# Patient Record
Sex: Female | Born: 1970 | Race: White | Hispanic: No | Smoking: Current every day smoker
Health system: Southern US, Community
[De-identification: ages and names within clinical notes are randomized; demographics above are authoritative.]

## PROBLEM LIST (undated history)

## (undated) ENCOUNTER — Emergency Department (HOSPITAL_COMMUNITY): Admission: EM | Payer: Medicaid Other | Source: Home / Self Care

## (undated) DIAGNOSIS — IMO0002 Reserved for concepts with insufficient information to code with codable children: Secondary | ICD-10-CM

## (undated) DIAGNOSIS — I469 Cardiac arrest, cause unspecified: Secondary | ICD-10-CM

## (undated) HISTORY — PX: NECK SURGERY: SHX720

---

## 2003-11-07 ENCOUNTER — Encounter: Admission: RE | Admit: 2003-11-07 | Discharge: 2003-11-07 | Payer: Self-pay | Admitting: Psychiatry

## 2004-06-02 ENCOUNTER — Ambulatory Visit (HOSPITAL_COMMUNITY): Payer: Self-pay | Admitting: Psychiatry

## 2004-07-14 ENCOUNTER — Ambulatory Visit (HOSPITAL_COMMUNITY): Payer: Self-pay | Admitting: Psychiatry

## 2004-08-06 ENCOUNTER — Ambulatory Visit (HOSPITAL_COMMUNITY): Payer: Self-pay | Admitting: Psychiatry

## 2004-09-12 ENCOUNTER — Ambulatory Visit (HOSPITAL_COMMUNITY): Payer: Self-pay | Admitting: Psychiatry

## 2007-03-23 ENCOUNTER — Emergency Department (HOSPITAL_COMMUNITY): Admission: EM | Admit: 2007-03-23 | Discharge: 2007-03-23 | Payer: Self-pay | Admitting: Emergency Medicine

## 2007-04-09 ENCOUNTER — Emergency Department (HOSPITAL_COMMUNITY): Admission: EM | Admit: 2007-04-09 | Discharge: 2007-04-09 | Payer: Self-pay | Admitting: Emergency Medicine

## 2007-04-20 ENCOUNTER — Ambulatory Visit: Payer: Self-pay | Admitting: *Deleted

## 2007-04-20 ENCOUNTER — Ambulatory Visit: Payer: Self-pay | Admitting: Internal Medicine

## 2007-04-20 ENCOUNTER — Encounter (INDEPENDENT_AMBULATORY_CARE_PROVIDER_SITE_OTHER): Payer: Self-pay | Admitting: Nurse Practitioner

## 2007-04-20 LAB — CONVERTED CEMR LAB
AST: 11 units/L (ref 0–37)
Albumin: 4.1 g/dL (ref 3.5–5.2)
Alkaline Phosphatase: 44 units/L (ref 39–117)
BUN: 10 mg/dL (ref 6–23)
Calcium: 8.8 mg/dL (ref 8.4–10.5)
Cholesterol: 153 mg/dL (ref 0–200)
Glucose, Bld: 88 mg/dL (ref 70–99)
Hemoglobin: 14.1 g/dL (ref 12.0–15.0)
MCHC: 32.7 g/dL (ref 30.0–36.0)
MCV: 94.7 fL (ref 78.0–100.0)
Neutro Abs: 4.7 10*3/uL (ref 1.7–7.7)
Neutrophils Relative %: 48 % (ref 43–77)
RBC: 4.55 M/uL (ref 3.87–5.11)
RDW: 12.6 % (ref 11.5–14.0)
Total Protein: 6.3 g/dL (ref 6.0–8.3)
WBC: 9.8 10*3/uL (ref 4.0–10.5)

## 2007-05-03 ENCOUNTER — Ambulatory Visit: Payer: Self-pay | Admitting: Internal Medicine

## 2007-05-31 ENCOUNTER — Ambulatory Visit: Payer: Self-pay | Admitting: Family Medicine

## 2007-06-27 ENCOUNTER — Ambulatory Visit: Payer: Self-pay | Admitting: Internal Medicine

## 2007-06-27 LAB — CONVERTED CEMR LAB
ALT: 15 U/L
AST: 12 U/L
Albumin: 4.3 g/dL
Alkaline Phosphatase: 63 U/L
BUN: 14 mg/dL
CO2: 24 meq/L
Calcium: 9.4 mg/dL
Chloride: 103 meq/L
Creatinine, Ser: 0.65 mg/dL
Glucose, Bld: 90 mg/dL
Potassium: 4.4 meq/L
Sodium: 139 meq/L
Total Bilirubin: 0.4 mg/dL
Total Protein: 6.9 g/dL

## 2007-07-26 ENCOUNTER — Ambulatory Visit: Payer: Self-pay | Admitting: Nurse Practitioner

## 2007-07-26 ENCOUNTER — Encounter (INDEPENDENT_AMBULATORY_CARE_PROVIDER_SITE_OTHER): Payer: Self-pay | Admitting: Nurse Practitioner

## 2007-08-30 ENCOUNTER — Ambulatory Visit: Payer: Self-pay | Admitting: Nurse Practitioner

## 2007-11-03 ENCOUNTER — Ambulatory Visit: Payer: Self-pay | Admitting: Family Medicine

## 2007-12-23 ENCOUNTER — Ambulatory Visit: Payer: Self-pay | Admitting: Internal Medicine

## 2014-01-25 ENCOUNTER — Emergency Department (HOSPITAL_COMMUNITY)
Admission: EM | Admit: 2014-01-25 | Discharge: 2014-01-26 | Discharge status: Against Medical Advice | Payer: BC Managed Care – PPO | Attending: Emergency Medicine | Admitting: Emergency Medicine

## 2014-01-25 ENCOUNTER — Encounter (HOSPITAL_COMMUNITY): Payer: Self-pay | Admitting: Emergency Medicine

## 2014-01-25 ENCOUNTER — Emergency Department (HOSPITAL_COMMUNITY): Payer: BC Managed Care – PPO

## 2014-01-25 DIAGNOSIS — Z79899 Other long term (current) drug therapy: Secondary | ICD-10-CM | POA: Insufficient documentation

## 2014-01-25 DIAGNOSIS — Z8739 Personal history of other diseases of the musculoskeletal system and connective tissue: Secondary | ICD-10-CM | POA: Insufficient documentation

## 2014-01-25 DIAGNOSIS — Z3202 Encounter for pregnancy test, result negative: Secondary | ICD-10-CM | POA: Insufficient documentation

## 2014-01-25 DIAGNOSIS — F17200 Nicotine dependence, unspecified, uncomplicated: Secondary | ICD-10-CM | POA: Insufficient documentation

## 2014-01-25 DIAGNOSIS — M545 Low back pain, unspecified: Secondary | ICD-10-CM | POA: Insufficient documentation

## 2014-01-25 HISTORY — DX: Reserved for concepts with insufficient information to code with codable children: IMO0002

## 2014-01-25 LAB — URINALYSIS, ROUTINE W REFLEX MICROSCOPIC
Bilirubin Urine: NEGATIVE
Glucose, UA: NEGATIVE mg/dL
HGB URINE DIPSTICK: NEGATIVE
Ketones, ur: NEGATIVE mg/dL
Leukocytes, UA: NEGATIVE
Nitrite: NEGATIVE
Protein, ur: NEGATIVE mg/dL
SPECIFIC GRAVITY, URINE: 1.024 (ref 1.005–1.030)
Urobilinogen, UA: 1 mg/dL (ref 0.0–1.0)
pH: 7.5 (ref 5.0–8.0)

## 2014-01-25 LAB — POC URINE PREG, ED: Preg Test, Ur: NEGATIVE

## 2014-01-25 NOTE — ED Notes (Signed)
Pt states she fell on Saturday and went to see her regular doctor for her back and was told to come here and get a scan of her back. Pt did not receive and pain medication from pcp. Pt states pcp thought she may have had a compression fracture. Pt rates pain 10/10.

## 2014-01-25 NOTE — ED Notes (Addendum)
Pt instructed that urine sample is needed, pt upset because she has been here for hours with no pain relief. Pt states she fell and she thinks she may have ruptured a disc because she has a hx of bulging disc in her back. Does not understand why urine is necessary.

## 2014-01-25 NOTE — ED Notes (Signed)
Pt given ice pack for pain.

## 2014-01-25 NOTE — ED Provider Notes (Signed)
CSN: [id number]     Arrival date & time 01/25/14  1948 History  This chart was scribed for non-physician practitioner Raymon Mutton, PA-C working with Monica Mackie, MD by Joaquin Music, ED Scribe. This patient was seen in room TR08C/TR08C and the patient's care was started at 11:46 PM .   Chief Complaint  Patient presents with  . Back Pain   The history is provided by the patient. No language interpreter was used.   HPI Comments: Monica Hopkins is a 43 y.o. female with hx of a prolapse disk and neck surgery who presents to the Emergency Department complaining of ongoing lower back pain with associated chronic tingling secondary to a fall that occurred 6 days ago. She states she was trying to grab her grandchild, she twisted and fell onto her buttocks from the chair. Describes the pain as sharp shooting. States she generally wakes up with a knot; states she has a bulging disc to T-spine. Pt states she seen her PCP and was advised to come to the ED and believes she has a compression fx; PCP is Dr. Randel Books.Pt states certain movements and positions worsen the pain. Neck surgery was 10 years ago. Has allergies to Abilify and Lamictal. Denies bowel & bladder incontinent , weakness, decreased urgency, skin color changes, CP, SOB, difficulty breathing, loss of sensation, numbness, tingling, hx of back surgery.  Based on pharmacist review - She has several controlled substances at the following locations: Xanax at [facility], [place]; Sub[facility]one [facility] 3/[facility] at [facility] [place], [place]; [facility]epam is at [facility] in [place]. She currently is being seen at a pain clinic. She is taking Gabapentin for pain and Tizanidine; states PCP stopped Tizanidine yesterday. She took 1 Suboxone this morning; is prescribed by Pain Management clinic.  Past Medical History  Diagnosis Date  . Prolapse, disk    History reviewed. No pertinent past surgical history. No family history on file. History  Substance Use Topics   . Smoking status: Current Every Day Smoker  . Smokeless tobacco: Not on file  . Alcohol Use: No   OB History   Grav Para Term Preterm Abortions TAB SAB Ect Mult Living                 Review of Systems  Respiratory: Negative for shortness of breath.   Cardiovascular: Negative for chest pain.  Genitourinary: Negative for dysuria, urgency and difficulty urinating.  Musculoskeletal: Positive for back pain and myalgias. Negative for gait problem, joint swelling, neck pain and neck stiffness.  Skin: Negative for color change, rash and wound.  Neurological: Negative for weakness and numbness.   Allergies  Abilify and Lamictal  Home Medications   Prior to Admission medications   Medication Sig Start Date End Date Taking? Authorizing Marcey Persad  clonazePAM (KLONOPIN) 0.5 MG tablet Take 0.5 mg by mouth daily. 01/11/14  Yes Historical Che Rachal, MD  divalproex (DEPAKOTE) 500 MG DR tablet Take 1,000 mg by mouth at bedtime. 01/10/14  Yes Historical Nataliah Hatlestad, MD  gabapentin (NEURONTIN) 600 MG tablet Take 1,200 mg by mouth 3 (three) times daily. 12/31/13  Yes Historical Daniele Dillow, MD  loratadine (CLARITIN) 10 MG tablet Take 10 mg by mouth daily.   Yes Historical Dorothyann Mourer, MD  promethazine (PHENERGAN) 25 MG tablet Take 25 mg by mouth 2 (two) times daily as needed for nausea.  01/24/14  Yes Historical Neila Teem, MD  SUBOXONE 8-2 MG FILM Take 1 tablet by mouth 3 (three) times daily. 12/27/13  Yes Historical Kennedy Brines, MD  tiZANidine (ZANAFLEX) 4 MG tablet Take 4 mg by mouth 3 (three) times daily. 12/15/13  Yes Historical Danicia Terhaar, MD   BP 104/66  Pulse 87  Temp(Src) 98.6 F (37 C) (Oral)  Resp 18  Ht 5\' 8"  (1.727 m)  Wt 220 lb (99.791 kg)  BMI 33.46 kg/m2  SpO2 98%  Physical Exam  Nursing note and vitals reviewed. Constitutional: She is oriented to person, place, and time. She appears well-developed and well-nourished. No distress.  Patient is sitting upright in chair with knees bent up near chest    HENT:  Head: Normocephalic and atraumatic.  Mouth/Throat: Oropharynx is clear and moist. No oropharyngeal exudate.  Eyes: Conjunctivae and EOM are normal. Pupils are equal, round, and reactive to light. Right eye exhibits no discharge. Left eye exhibits no discharge.  Neck: Normal range of motion. Neck supple. No tracheal deviation present.  Cardiovascular: Normal rate, regular rhythm and normal heart sounds.  Exam reveals no friction rub.   No murmur heard. Pulses:      Radial pulses are 2+ on the right side, and 2+ on the left side.       Dorsalis pedis pulses are 2+ on the right side, and 2+ on the left side.  Cap refill < 3 seconds Negative swelling or pitting edema noted to the lower extremities bilaterally   Pulmonary/Chest: Effort normal and breath sounds normal. No respiratory distress. She has no wheezes. She has no rales.  Patient is able to speak in full sentences without difficulty  Negative use of accessory muscles Negative stridor  Musculoskeletal: Normal range of motion. She exhibits tenderness. She exhibits no edema.       Lumbar back: She exhibits tenderness (muscular ). She exhibits normal range of motion, no bony tenderness, no swelling, no edema, no deformity and no laceration.       Back:  Negative deformities noted to the spine. Discomfort upon palpation to the mid lumbosacral region and bilateral paraspinal regions - appears muscular in nature.  Full ROM to upper and lower extremities without difficulty noted, negative ataxia noted.  Lymphadenopathy:    She has no cervical adenopathy.  Neurological: She is alert and oriented to person, place, and time. No cranial nerve deficit. She exhibits normal muscle tone. Coordination normal.  Cranial nerves III-XII grossly intact Strength 5+/5+ to upper and lower extremities bilaterally with resistance applied, equal distribution noted Equal grip strength  Strength intact to the digits of the feet bilaterally Sensation  intact with differentiation to sharp and dull touch to upper and lower extremities bilaterally  Negative saddle paresthesias bilaterally  Negative facial drooping Negative slurred speech  Negative deformities Negative arm drift Fine motor skills intact Gait proper, proper balance - negative sway, negative drift, negative step-offs. Negative ataxia noted with gait. Patient able to stand on both legs without sway or difficulty.   Skin: Skin is warm and dry. No rash noted. She is not diaphoretic. No erythema.  Psychiatric: She has a normal mood and affect. Her behavior is normal. Thought content normal.    ED Course  Procedures (including critical care time) DIAGNOSTIC STUDIES: Oxygen Saturation is 98% on RA, normal by my interpretation.    COORDINATION OF CARE: 11:52 PM-Discussed treatment plan which includes discussed X-ray and lab findings.   Pt agreed to plan.   Labs Review Results for orders placed during the hospital encounter of 01/25/14  URINALYSIS, ROUTINE W REFLEX MICROSCOPIC      Result Value Ref Range   Color, Urine  YELLOW  YELLOW   APPearance CLOUDY (*) CLEAR   Specific Gravity, Urine 1.024  1.005 - 1.030   pH 7.5  5.0 - 8.0   Glucose, UA NEGATIVE  NEGATIVE mg/dL   Hgb urine dipstick NEGATIVE  NEGATIVE   Bilirubin Urine NEGATIVE  NEGATIVE   Ketones, ur NEGATIVE  NEGATIVE mg/dL   Protein, ur NEGATIVE  NEGATIVE mg/dL   Urobilinogen, UA 1.0  0.0 - 1.0 mg/dL   Nitrite NEGATIVE  NEGATIVE   Leukocytes, UA NEGATIVE  NEGATIVE  POC URINE PREG, ED      Result Value Ref Range   Preg Test, Ur NEGATIVE  NEGATIVE   Imaging Review Dg Lumbar Spine Complete  01/25/2014   CLINICAL DATA:  Larey SeatFell and twisted back 5 days ago. Upper lumbar back pain.  EXAM: LUMBAR SPINE - COMPLETE 4+ VIEW  COMPARISON:  03/09/2010  FINDINGS: Degenerative changes in the lumbar spine with narrowed lumbar interspaces and associated endplate hypertrophic changes. No vertebral compression deformities. Normal  alignment of the lumbar spine. No focal bone lesion or bone destruction. Bone cortex appears intact. Surgical clips in the right upper quadrant. Vascular calcifications. No significant changes since prior study.  IMPRESSION: Degenerative changes in the lumbar spine. No displaced acute fractures identified.   Electronically Signed   By: Burman NievesWilliam  Stevens M.D.   On: 01/25/2014 21:44     EKG Interpretation None     MDM   Final diagnoses:  Low back pain without sciatica, unspecified back pain laterality   Medications - No data to display  Filed Vitals:   01/25/14 2000  BP: 104/66  Pulse: 87  Temp: 98.6 F (37 C)  TempSrc: Oral  Resp: 18  Height: 5\' 8"  (1.727 m)  Weight: 220 lb (99.791 kg)  SpO2: 98%   I personally performed the services described in this documentation, which was scribed in my presence. The recorded information has been reviewed and is accurate.  This Corgan Mormile reviewed patient's chart alongside pharmacist. Patient is taking Xanax, Klonopin, tizanidine, Suboxone for opiate abuse. Patient has pain management clinic. Patient has been dealing with back pain for many years, as per patient she has history of a bulging disc.  Urine pregnancy negative. Urinalysis negative for Hgb, leukocytes, nitrites - negative signs of infection. Lumbar spine plain film noted degenerative changes identified in the lumbar spine-no displaced acute fractures noted. No vertebral compression deformities noted. No focal bone lesion or bone destruction noted - normal alignment seen. Negative focal neurological deficits identified. Full range of motion to upper and lower extremities bilaterally without difficulty. Strength intact. Sensation intact. Pulses palpable and strong. Gait proper with-negative step-offs or sway-patient is able to walk from room to bathroom as well as in the hallway - negative ataxic gait noted. Denied loss of sensation, urinary and bowel incontinence, weakness. Doubt cauda equina.  Doubt epidural abscess. Doubt pyelonephritis. Suspicion to be muscular pain secondary to fall - pain upon palpation and with motion - episode of acute exacerbation of chronic back pain due to fall that occurred 6 days ago.   12:17 AM This Sandi Towe was notified by nurse that patient left AMA - reported that patient was unhappy regarding the wait and no narcotics given. Nurse reported that she talked to the patient, but the patient and husband refused to stay - reported that patient and husband walked out of the ED. Nurse reported that the patient ambulated well upon exiting the ED.   Raymon MuttonMarissa Sciacca, PA-C 01/26/14 1051

## 2014-01-25 NOTE — ED Notes (Signed)
Pt. reports progressing pain at lower back worse with movement and certain positions , pt. stated she fell at home last week . Ambulatory / denies urinary discomfort .

## 2014-01-26 MED ORDER — IBUPROFEN 400 MG PO TABS
800.0000 mg | ORAL_TABLET | Freq: Once | ORAL | Status: DC
  Filled 2014-01-26: qty 2

## 2014-01-26 NOTE — ED Provider Notes (Signed)
Medical screening examination/treatment/procedure(s) were performed by non-physician practitioner and as supervising physician I was immediately available for consultation/collaboration.   EKG Interpretation None        Gwyneth SproutWhitney Chidiebere Wynn, MD 01/26/14 1413

## 2014-01-26 NOTE — ED Notes (Addendum)
Pt upset because no narcotics were prescribed. Pt states "I can take ibuprofen at home"! Pt left before being assessed by PA. Pt's husband asked for number to file a grievance due to long wait and not being prescribed anything besides Ibuprofen, informed pt that I will get the charge nurse to speak with them. Pt's husband stated nevermind he will find out on his own. Pt was ambulatory to door.

## 2015-02-11 ENCOUNTER — Other Ambulatory Visit: Payer: Self-pay | Admitting: Neurosurgery

## 2015-02-11 DIAGNOSIS — M5416 Radiculopathy, lumbar region: Secondary | ICD-10-CM

## 2015-02-19 ENCOUNTER — Ambulatory Visit
Admission: RE | Admit: 2015-02-19 | Discharge: 2015-02-19 | Disposition: A | Discharge status: Home / Self Care | Payer: 59 | Source: Ambulatory Visit | Attending: Neurosurgery | Admitting: Neurosurgery

## 2015-02-19 DIAGNOSIS — M5416 Radiculopathy, lumbar region: Secondary | ICD-10-CM

## 2015-02-19 MED ORDER — ONDANSETRON HCL 4 MG/2ML IJ SOLN
4.0000 mg | Freq: Once | INTRAMUSCULAR | Status: AC
  Administered 2015-02-19: 4 mg via INTRAMUSCULAR

## 2015-02-19 MED ORDER — HYDROMORPHONE HCL 1 MG/ML IJ SOLN
1.0000 mg | Freq: Once | INTRAMUSCULAR | Status: AC
  Administered 2015-02-19: 1 mg via INTRAMUSCULAR

## 2015-02-19 MED ORDER — IOHEXOL 180 MG/ML  SOLN
15.0000 mL | Freq: Once | INTRAMUSCULAR | Status: AC | PRN
  Administered 2015-02-19: 15 mL via INTRATHECAL

## 2015-02-19 MED ORDER — HYDROMORPHONE HCL 2 MG/ML IJ SOLN
2.0000 mg | Freq: Once | INTRAMUSCULAR | Status: AC
  Administered 2015-02-19: 2 mg via INTRAMUSCULAR

## 2015-02-19 MED ORDER — DIAZEPAM 5 MG PO TABS
10.0000 mg | ORAL_TABLET | Freq: Once | ORAL | Status: AC
  Administered 2015-02-19: 10 mg via ORAL

## 2015-02-19 MED ORDER — ONDANSETRON HCL 4 MG/2ML IJ SOLN: 4.0000 mg | Freq: Four times a day (QID) | INTRAMUSCULAR | Status: DC | PRN

## 2015-02-19 NOTE — Discharge Instructions (Signed)

## 2015-07-10 DIAGNOSIS — M961 Postlaminectomy syndrome, not elsewhere classified: Secondary | ICD-10-CM | POA: Insufficient documentation

## 2015-07-10 DIAGNOSIS — G894 Chronic pain syndrome: Secondary | ICD-10-CM | POA: Insufficient documentation

## 2016-03-10 ENCOUNTER — Encounter (HOSPITAL_COMMUNITY): Payer: Self-pay

## 2016-03-10 ENCOUNTER — Ambulatory Visit (HOSPITAL_COMMUNITY)
Admission: RE | Admit: 2016-03-10 | Discharge: 2016-03-10 | Disposition: A | Discharge status: Home / Self Care | Payer: BLUE CROSS/BLUE SHIELD | Source: Ambulatory Visit | Attending: Vascular Surgery | Admitting: Vascular Surgery

## 2016-03-10 ENCOUNTER — Other Ambulatory Visit (HOSPITAL_COMMUNITY): Payer: Self-pay | Admitting: Pain Medicine

## 2016-03-10 DIAGNOSIS — M79606 Pain in leg, unspecified: Secondary | ICD-10-CM

## 2016-03-10 DIAGNOSIS — M7989 Other specified soft tissue disorders: Secondary | ICD-10-CM

## 2017-04-15 ENCOUNTER — Other Ambulatory Visit: Payer: Self-pay | Admitting: Nurse Practitioner

## 2017-04-15 DIAGNOSIS — R7989 Other specified abnormal findings of blood chemistry: Secondary | ICD-10-CM

## 2017-04-16 ENCOUNTER — Ambulatory Visit
Admission: RE | Admit: 2017-04-16 | Discharge: 2017-04-16 | Disposition: A | Discharge status: Home / Self Care | Payer: BLUE CROSS/BLUE SHIELD | Source: Ambulatory Visit | Attending: Nurse Practitioner | Admitting: Nurse Practitioner

## 2017-04-16 DIAGNOSIS — R7989 Other specified abnormal findings of blood chemistry: Secondary | ICD-10-CM

## 2017-06-07 ENCOUNTER — Ambulatory Visit (INDEPENDENT_AMBULATORY_CARE_PROVIDER_SITE_OTHER): Payer: BLUE CROSS/BLUE SHIELD | Admitting: Psychiatry

## 2017-06-07 ENCOUNTER — Encounter (INDEPENDENT_AMBULATORY_CARE_PROVIDER_SITE_OTHER): Payer: Self-pay

## 2017-06-07 ENCOUNTER — Encounter (HOSPITAL_COMMUNITY): Payer: Self-pay | Admitting: Psychiatry

## 2017-06-07 VITALS — BP 122/78 | HR 85 | Ht 67.0 in | Wt 254.0 lb

## 2017-06-07 DIAGNOSIS — Z62810 Personal history of physical and sexual abuse in childhood: Secondary | ICD-10-CM | POA: Diagnosis not present

## 2017-06-07 DIAGNOSIS — Z79899 Other long term (current) drug therapy: Secondary | ICD-10-CM | POA: Diagnosis not present

## 2017-06-07 DIAGNOSIS — R4584 Anhedonia: Secondary | ICD-10-CM | POA: Diagnosis not present

## 2017-06-07 DIAGNOSIS — R5383 Other fatigue: Secondary | ICD-10-CM

## 2017-06-07 DIAGNOSIS — R4587 Impulsiveness: Secondary | ICD-10-CM | POA: Diagnosis not present

## 2017-06-07 DIAGNOSIS — F4010 Social phobia, unspecified: Secondary | ICD-10-CM

## 2017-06-07 DIAGNOSIS — F319 Bipolar disorder, unspecified: Secondary | ICD-10-CM | POA: Diagnosis not present

## 2017-06-07 DIAGNOSIS — Z91411 Personal history of adult psychological abuse: Secondary | ICD-10-CM

## 2017-06-07 DIAGNOSIS — F419 Anxiety disorder, unspecified: Secondary | ICD-10-CM | POA: Diagnosis not present

## 2017-06-07 DIAGNOSIS — F515 Nightmare disorder: Secondary | ICD-10-CM | POA: Diagnosis not present

## 2017-06-07 DIAGNOSIS — R4583 Excessive crying of child, adolescent or adult: Secondary | ICD-10-CM | POA: Diagnosis not present

## 2017-06-07 DIAGNOSIS — F410 Panic disorder [episodic paroxysmal anxiety] without agoraphobia: Secondary | ICD-10-CM

## 2017-06-07 DIAGNOSIS — Z818 Family history of other mental and behavioral disorders: Secondary | ICD-10-CM

## 2017-06-07 DIAGNOSIS — F39 Unspecified mood [affective] disorder: Secondary | ICD-10-CM

## 2017-06-07 DIAGNOSIS — M549 Dorsalgia, unspecified: Secondary | ICD-10-CM

## 2017-06-07 DIAGNOSIS — G4700 Insomnia, unspecified: Secondary | ICD-10-CM

## 2017-06-07 DIAGNOSIS — R454 Irritability and anger: Secondary | ICD-10-CM

## 2017-06-07 DIAGNOSIS — F17210 Nicotine dependence, cigarettes, uncomplicated: Secondary | ICD-10-CM

## 2017-06-07 DIAGNOSIS — F22 Delusional disorders: Secondary | ICD-10-CM

## 2017-06-07 DIAGNOSIS — R4582 Worries: Secondary | ICD-10-CM

## 2017-06-07 DIAGNOSIS — M2550 Pain in unspecified joint: Secondary | ICD-10-CM

## 2017-06-07 DIAGNOSIS — R5381 Other malaise: Secondary | ICD-10-CM

## 2017-06-07 DIAGNOSIS — R51 Headache: Secondary | ICD-10-CM

## 2017-06-07 DIAGNOSIS — R450 Nervousness: Secondary | ICD-10-CM

## 2017-06-07 MED ORDER — CLONAZEPAM 0.5 MG PO TABS: 0.5000 mg | ORAL_TABLET | Freq: Two times a day (BID) | ORAL | 0 refills | Status: DC

## 2017-06-07 MED ORDER — LURASIDONE HCL 40 MG PO TABS: 40.0000 mg | ORAL_TABLET | Freq: Every day | ORAL | 0 refills | Status: DC

## 2017-06-07 NOTE — Addendum Note (Signed)
Addended by: Rosalita LevanATKINS, Sailor Hevia E on: 06/07/2017 01:33 PM   Modules accepted: Orders
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
## 2017-06-08 LAB — CBC WITH DIFFERENTIAL/PLATELET
BASOS ABS: 0 10*3/uL (ref 0.0–0.2)
Basos: 0 %
EOS (ABSOLUTE): 0.2 10*3/uL (ref 0.0–0.4)
Eos: 3 %
HEMOGLOBIN: 13.2 g/dL (ref 11.1–15.9)
Hematocrit: 39.5 % (ref 34.0–46.6)
Immature Grans (Abs): 0 10*3/uL (ref 0.0–0.1)
Immature Granulocytes: 0 %
LYMPHS ABS: 2.9 10*3/uL (ref 0.7–3.1)
Lymphs: 34 %
MCH: 30.6 pg (ref 26.6–33.0)
MCHC: 33.4 g/dL (ref 31.5–35.7)
MCV: 92 fL (ref 79–97)
Monocytes Absolute: 0.6 10*3/uL (ref 0.1–0.9)
Monocytes: 7 %
NEUTROS ABS: 4.8 10*3/uL (ref 1.4–7.0)
Neutrophils: 56 %
PLATELETS: 274 10*3/uL (ref 150–379)
RBC: 4.31 x10E6/uL (ref 3.77–5.28)
RDW: 14.3 % (ref 12.3–15.4)
WBC: 8.6 10*3/uL (ref 3.4–10.8)

## 2017-06-08 LAB — COMPREHENSIVE METABOLIC PANEL
A/G RATIO: 1.6 (ref 1.2–2.2)
ALBUMIN: 3.8 g/dL (ref 3.5–5.5)
ALT: 34 IU/L — ABNORMAL HIGH (ref 0–32)
AST: 22 IU/L (ref 0–40)
Alkaline Phosphatase: 77 IU/L (ref 39–117)
BILIRUBIN TOTAL: 0.2 mg/dL (ref 0.0–1.2)
BUN / CREAT RATIO: 15 (ref 9–23)
BUN: 9 mg/dL (ref 6–24)
CHLORIDE: 100 mmol/L (ref 96–106)
CO2: 23 mmol/L (ref 20–29)
Calcium: 8.8 mg/dL (ref 8.7–10.2)
Creatinine, Ser: 0.62 mg/dL (ref 0.57–1.00)
GFR calc non Af Amer: 108 mL/min/{1.73_m2} (ref >59)
GFR, EST AFRICAN AMERICAN: 125 mL/min/{1.73_m2} (ref >59)
GLOBULIN, TOTAL: 2.4 g/dL (ref 1.5–4.5)
Glucose: 77 mg/dL (ref 65–99)
POTASSIUM: 4.5 mmol/L (ref 3.5–5.2)
Sodium: 140 mmol/L (ref 134–144)
TOTAL PROTEIN: 6.2 g/dL (ref 6.0–8.5)

## 2017-06-08 LAB — HEMOGLOBIN A1C
Est. average glucose Bld gHb Est-mCnc: 123 mg/dL
Hgb A1c MFr Bld: 5.9 % — ABNORMAL HIGH (ref 4.8–5.6)

## 2017-06-08 LAB — VALPROIC ACID LEVEL

## 2017-06-22 ENCOUNTER — Ambulatory Visit (HOSPITAL_COMMUNITY): Payer: Self-pay | Admitting: Licensed Clinical Social Worker

## 2017-07-01 ENCOUNTER — Other Ambulatory Visit (HOSPITAL_COMMUNITY): Payer: Self-pay

## 2017-07-01 ENCOUNTER — Encounter (HOSPITAL_COMMUNITY): Payer: Self-pay | Admitting: Psychiatry

## 2017-07-01 ENCOUNTER — Ambulatory Visit (INDEPENDENT_AMBULATORY_CARE_PROVIDER_SITE_OTHER): Payer: BLUE CROSS/BLUE SHIELD | Admitting: Psychiatry

## 2017-07-01 VITALS — BP 116/62 | HR 86 | Ht 67.0 in | Wt 254.0 lb

## 2017-07-01 DIAGNOSIS — F411 Generalized anxiety disorder: Secondary | ICD-10-CM

## 2017-07-01 DIAGNOSIS — Z79899 Other long term (current) drug therapy: Secondary | ICD-10-CM | POA: Diagnosis not present

## 2017-07-01 DIAGNOSIS — F3131 Bipolar disorder, current episode depressed, mild: Secondary | ICD-10-CM | POA: Diagnosis not present

## 2017-07-01 DIAGNOSIS — Z62810 Personal history of physical and sexual abuse in childhood: Secondary | ICD-10-CM

## 2017-07-01 MED ORDER — ZIPRASIDONE HCL 20 MG PO CAPS: ORAL_CAPSULE | ORAL | 1 refills | Status: DC

## 2017-07-01 MED ORDER — CLONAZEPAM 0.5 MG PO TABS: 0.5000 mg | ORAL_TABLET | Freq: Two times a day (BID) | ORAL | 1 refills | Status: DC

## 2017-07-01 MED ORDER — DIVALPROEX SODIUM 500 MG PO DR TAB: 1000.0000 mg | DELAYED_RELEASE_TABLET | Freq: Every day | ORAL | 1 refills | Status: DC
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
## 2017-07-02 LAB — VALPROIC ACID LEVEL: Valproic Acid Lvl: 50 ug/mL (ref 50–100)

## 2017-07-05 ENCOUNTER — Telehealth (HOSPITAL_COMMUNITY): Payer: Self-pay

## 2017-07-05 NOTE — Telephone Encounter (Signed)
Patient is calling to let you know that her insurance will only pay for 2 Geodon a day not 3. You told her to call you and let you know if this was an issue.

## 2017-07-14 ENCOUNTER — Other Ambulatory Visit (HOSPITAL_COMMUNITY): Payer: Self-pay | Admitting: Psychiatry

## 2017-07-15 ENCOUNTER — Ambulatory Visit (HOSPITAL_COMMUNITY): Payer: Self-pay | Admitting: Licensed Clinical Social Worker

## 2017-08-02 ENCOUNTER — Other Ambulatory Visit (HOSPITAL_COMMUNITY): Payer: Self-pay

## 2017-08-02 DIAGNOSIS — F3131 Bipolar disorder, current episode depressed, mild: Secondary | ICD-10-CM

## 2017-08-02 MED ORDER — ZIPRASIDONE HCL 60 MG PO CAPS: ORAL_CAPSULE | ORAL | 1 refills | Status: DC

## 2017-08-17 ENCOUNTER — Ambulatory Visit (HOSPITAL_COMMUNITY): Payer: Self-pay | Admitting: Psychiatry

## 2017-08-27 ENCOUNTER — Ambulatory Visit (HOSPITAL_COMMUNITY): Payer: Self-pay | Admitting: Psychiatry

## 2017-09-01 ENCOUNTER — Ambulatory Visit (INDEPENDENT_AMBULATORY_CARE_PROVIDER_SITE_OTHER): Payer: BLUE CROSS/BLUE SHIELD | Admitting: Psychiatry

## 2017-09-01 ENCOUNTER — Encounter (HOSPITAL_COMMUNITY): Payer: Self-pay | Admitting: Psychiatry

## 2017-09-01 VITALS — BP 116/78 | HR 85 | Ht 66.5 in | Wt 255.0 lb

## 2017-09-01 DIAGNOSIS — Z62810 Personal history of physical and sexual abuse in childhood: Secondary | ICD-10-CM

## 2017-09-01 DIAGNOSIS — F17200 Nicotine dependence, unspecified, uncomplicated: Secondary | ICD-10-CM

## 2017-09-01 DIAGNOSIS — F4310 Post-traumatic stress disorder, unspecified: Secondary | ICD-10-CM

## 2017-09-01 DIAGNOSIS — Z818 Family history of other mental and behavioral disorders: Secondary | ICD-10-CM

## 2017-09-01 DIAGNOSIS — F3131 Bipolar disorder, current episode depressed, mild: Secondary | ICD-10-CM

## 2017-09-01 DIAGNOSIS — F411 Generalized anxiety disorder: Secondary | ICD-10-CM

## 2017-09-01 DIAGNOSIS — Z79899 Other long term (current) drug therapy: Secondary | ICD-10-CM

## 2017-09-01 DIAGNOSIS — G8929 Other chronic pain: Secondary | ICD-10-CM | POA: Diagnosis not present

## 2017-09-01 MED ORDER — LITHIUM CARBONATE 300 MG PO TABS: 300.0000 mg | ORAL_TABLET | Freq: Two times a day (BID) | ORAL | 1 refills | Status: DC

## 2017-09-01 MED ORDER — DIVALPROEX SODIUM 500 MG PO DR TAB: 1000.0000 mg | DELAYED_RELEASE_TABLET | Freq: Every day | ORAL | 1 refills | Status: DC

## 2017-09-01 MED ORDER — CLONAZEPAM 0.5 MG PO TABS: 0.5000 mg | ORAL_TABLET | Freq: Two times a day (BID) | ORAL | 1 refills | Status: DC
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
## 2017-09-08 ENCOUNTER — Ambulatory Visit (HOSPITAL_COMMUNITY): Payer: Self-pay | Admitting: Licensed Clinical Social Worker

## 2017-09-28 ENCOUNTER — Ambulatory Visit (HOSPITAL_COMMUNITY): Payer: Self-pay | Admitting: Licensed Clinical Social Worker

## 2017-10-12 ENCOUNTER — Other Ambulatory Visit (HOSPITAL_COMMUNITY): Payer: Self-pay

## 2017-10-12 ENCOUNTER — Encounter (HOSPITAL_COMMUNITY): Payer: Self-pay | Admitting: Psychiatry

## 2017-10-12 ENCOUNTER — Ambulatory Visit (INDEPENDENT_AMBULATORY_CARE_PROVIDER_SITE_OTHER): Payer: BLUE CROSS/BLUE SHIELD | Admitting: Psychiatry

## 2017-10-12 VITALS — BP 110/72 | HR 73 | Ht 66.5 in | Wt 256.0 lb

## 2017-10-12 DIAGNOSIS — Z79899 Other long term (current) drug therapy: Secondary | ICD-10-CM

## 2017-10-12 DIAGNOSIS — Z62810 Personal history of physical and sexual abuse in childhood: Secondary | ICD-10-CM | POA: Diagnosis not present

## 2017-10-12 DIAGNOSIS — G8929 Other chronic pain: Secondary | ICD-10-CM

## 2017-10-12 DIAGNOSIS — Z634 Disappearance and death of family member: Secondary | ICD-10-CM | POA: Diagnosis not present

## 2017-10-12 DIAGNOSIS — F17210 Nicotine dependence, cigarettes, uncomplicated: Secondary | ICD-10-CM

## 2017-10-12 DIAGNOSIS — F3131 Bipolar disorder, current episode depressed, mild: Secondary | ICD-10-CM | POA: Diagnosis not present

## 2017-10-12 DIAGNOSIS — Z818 Family history of other mental and behavioral disorders: Secondary | ICD-10-CM

## 2017-10-12 DIAGNOSIS — Z630 Problems in relationship with spouse or partner: Secondary | ICD-10-CM

## 2017-10-12 DIAGNOSIS — F411 Generalized anxiety disorder: Secondary | ICD-10-CM | POA: Diagnosis not present

## 2017-10-12 MED ORDER — LITHIUM CARBONATE ER 450 MG PO TBCR: 450.0000 mg | EXTENDED_RELEASE_TABLET | Freq: Two times a day (BID) | ORAL | 1 refills | Status: DC

## 2017-10-12 MED ORDER — DIVALPROEX SODIUM 500 MG PO DR TAB: 1000.0000 mg | DELAYED_RELEASE_TABLET | Freq: Every day | ORAL | 1 refills | Status: DC

## 2017-10-12 MED ORDER — CLONAZEPAM 0.5 MG PO TABS: 0.5000 mg | ORAL_TABLET | Freq: Two times a day (BID) | ORAL | 1 refills | Status: DC
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
## 2017-10-13 LAB — LITHIUM LEVEL: Lithium Lvl: 0.5 mmol/L — ABNORMAL LOW (ref 0.6–1.2)

## 2017-10-25 ENCOUNTER — Other Ambulatory Visit (HOSPITAL_COMMUNITY): Payer: Self-pay | Admitting: Psychiatry

## 2017-10-25 DIAGNOSIS — F3131 Bipolar disorder, current episode depressed, mild: Secondary | ICD-10-CM

## 2017-10-26 ENCOUNTER — Other Ambulatory Visit (HOSPITAL_COMMUNITY): Payer: Self-pay | Admitting: Psychiatry

## 2017-10-26 NOTE — Telephone Encounter (Signed)
Dose increase.  She was given a new prescription.

## 2017-11-04 ENCOUNTER — Ambulatory Visit (HOSPITAL_COMMUNITY): Payer: Self-pay | Admitting: Licensed Clinical Social Worker

## 2017-11-23 ENCOUNTER — Ambulatory Visit (HOSPITAL_COMMUNITY): Payer: BLUE CROSS/BLUE SHIELD | Admitting: Psychiatry

## 2017-12-02 ENCOUNTER — Other Ambulatory Visit: Payer: Self-pay

## 2017-12-02 ENCOUNTER — Ambulatory Visit (INDEPENDENT_AMBULATORY_CARE_PROVIDER_SITE_OTHER): Payer: BLUE CROSS/BLUE SHIELD | Admitting: Psychiatry

## 2017-12-02 ENCOUNTER — Encounter (HOSPITAL_COMMUNITY): Payer: Self-pay | Admitting: Psychiatry

## 2017-12-02 DIAGNOSIS — Z79899 Other long term (current) drug therapy: Secondary | ICD-10-CM

## 2017-12-02 DIAGNOSIS — F3131 Bipolar disorder, current episode depressed, mild: Secondary | ICD-10-CM

## 2017-12-02 DIAGNOSIS — G8929 Other chronic pain: Secondary | ICD-10-CM

## 2017-12-02 DIAGNOSIS — Z818 Family history of other mental and behavioral disorders: Secondary | ICD-10-CM

## 2017-12-02 DIAGNOSIS — Z62810 Personal history of physical and sexual abuse in childhood: Secondary | ICD-10-CM

## 2017-12-02 DIAGNOSIS — F17200 Nicotine dependence, unspecified, uncomplicated: Secondary | ICD-10-CM

## 2017-12-02 DIAGNOSIS — F411 Generalized anxiety disorder: Secondary | ICD-10-CM

## 2017-12-02 MED ORDER — CLONAZEPAM 0.5 MG PO TABS: 0.5000 mg | ORAL_TABLET | Freq: Two times a day (BID) | ORAL | 1 refills | Status: DC

## 2017-12-02 MED ORDER — LITHIUM CARBONATE ER 450 MG PO TBCR: 450.0000 mg | EXTENDED_RELEASE_TABLET | Freq: Two times a day (BID) | ORAL | 1 refills | Status: DC
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
## 2017-12-07 ENCOUNTER — Ambulatory Visit (HOSPITAL_COMMUNITY): Payer: Self-pay | Admitting: Licensed Clinical Social Worker

## 2018-01-11 ENCOUNTER — Ambulatory Visit (HOSPITAL_COMMUNITY): Payer: BLUE CROSS/BLUE SHIELD | Admitting: Psychiatry

## 2018-01-13 ENCOUNTER — Other Ambulatory Visit (HOSPITAL_COMMUNITY): Payer: Self-pay | Admitting: Psychiatry

## 2018-01-13 DIAGNOSIS — F3131 Bipolar disorder, current episode depressed, mild: Secondary | ICD-10-CM

## 2018-01-20 ENCOUNTER — Ambulatory Visit (HOSPITAL_COMMUNITY): Payer: BLUE CROSS/BLUE SHIELD | Admitting: Psychiatry

## 2018-01-25 ENCOUNTER — Encounter (HOSPITAL_COMMUNITY): Payer: Self-pay | Admitting: Psychiatry

## 2018-01-25 ENCOUNTER — Ambulatory Visit (INDEPENDENT_AMBULATORY_CARE_PROVIDER_SITE_OTHER): Payer: BLUE CROSS/BLUE SHIELD | Admitting: Psychiatry

## 2018-01-25 DIAGNOSIS — Z818 Family history of other mental and behavioral disorders: Secondary | ICD-10-CM | POA: Diagnosis not present

## 2018-01-25 DIAGNOSIS — F411 Generalized anxiety disorder: Secondary | ICD-10-CM | POA: Diagnosis not present

## 2018-01-25 DIAGNOSIS — F3131 Bipolar disorder, current episode depressed, mild: Secondary | ICD-10-CM

## 2018-01-25 DIAGNOSIS — F17210 Nicotine dependence, cigarettes, uncomplicated: Secondary | ICD-10-CM | POA: Diagnosis not present

## 2018-01-25 DIAGNOSIS — Z79899 Other long term (current) drug therapy: Secondary | ICD-10-CM | POA: Diagnosis not present

## 2018-01-25 DIAGNOSIS — M549 Dorsalgia, unspecified: Secondary | ICD-10-CM | POA: Diagnosis not present

## 2018-01-25 DIAGNOSIS — M2550 Pain in unspecified joint: Secondary | ICD-10-CM

## 2018-01-25 MED ORDER — LITHIUM CARBONATE ER 450 MG PO TBCR: 450.0000 mg | EXTENDED_RELEASE_TABLET | Freq: Two times a day (BID) | ORAL | 2 refills | Status: DC

## 2018-01-25 MED ORDER — TRAZODONE HCL 100 MG PO TABS: ORAL_TABLET | ORAL | 1 refills | Status: DC

## 2018-01-25 MED ORDER — CLONAZEPAM 0.5 MG PO TABS: 0.5000 mg | ORAL_TABLET | Freq: Two times a day (BID) | ORAL | 2 refills | Status: DC
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
## 2018-02-17 ENCOUNTER — Other Ambulatory Visit (HOSPITAL_COMMUNITY): Payer: Self-pay | Admitting: Psychiatry

## 2018-02-17 DIAGNOSIS — F3131 Bipolar disorder, current episode depressed, mild: Secondary | ICD-10-CM

## 2018-03-20 ENCOUNTER — Other Ambulatory Visit (HOSPITAL_COMMUNITY): Payer: Self-pay | Admitting: Psychiatry

## 2018-03-20 DIAGNOSIS — F3131 Bipolar disorder, current episode depressed, mild: Secondary | ICD-10-CM

## 2018-04-20 ENCOUNTER — Other Ambulatory Visit (HOSPITAL_COMMUNITY): Payer: Self-pay | Admitting: Psychiatry

## 2018-04-20 DIAGNOSIS — F3131 Bipolar disorder, current episode depressed, mild: Secondary | ICD-10-CM

## 2018-04-26 ENCOUNTER — Ambulatory Visit (HOSPITAL_COMMUNITY): Payer: Self-pay | Admitting: Psychiatry

## 2018-05-02 ENCOUNTER — Ambulatory Visit (HOSPITAL_COMMUNITY): Payer: BLUE CROSS/BLUE SHIELD | Admitting: Psychiatry

## 2018-05-03 ENCOUNTER — Telehealth (HOSPITAL_COMMUNITY): Payer: Self-pay

## 2018-05-03 NOTE — Telephone Encounter (Signed)
Patient called needing a refill on Lithium 450mg , and Klonopin 0.5mg  tabs. Appointment had to be rescheduled. Please advise. Next appointment is scheduled for 05-31-18

## 2018-05-03 NOTE — Telephone Encounter (Signed)
Please refill her prescription for 30 days.

## 2018-05-05 ENCOUNTER — Other Ambulatory Visit (HOSPITAL_COMMUNITY): Payer: Self-pay

## 2018-05-05 DIAGNOSIS — F3131 Bipolar disorder, current episode depressed, mild: Secondary | ICD-10-CM

## 2018-05-05 DIAGNOSIS — F411 Generalized anxiety disorder: Secondary | ICD-10-CM

## 2018-05-05 MED ORDER — LITHIUM CARBONATE ER 450 MG PO TBCR: 450.0000 mg | EXTENDED_RELEASE_TABLET | Freq: Two times a day (BID) | ORAL | 0 refills | Status: DC

## 2018-05-05 MED ORDER — CLONAZEPAM 0.5 MG PO TABS: 0.5000 mg | ORAL_TABLET | Freq: Two times a day (BID) | ORAL | 0 refills | Status: DC

## 2018-05-05 MED ORDER — LITHIUM CARBONATE ER 450 MG PO TBCR: 450.0000 mg | EXTENDED_RELEASE_TABLET | Freq: Two times a day (BID) | ORAL | 2 refills | Status: DC

## 2018-05-05 NOTE — Telephone Encounter (Signed)
Called in medications into pharmacy and notified patient

## 2018-05-31 ENCOUNTER — Encounter (HOSPITAL_COMMUNITY): Payer: Self-pay | Admitting: Psychiatry

## 2018-05-31 ENCOUNTER — Ambulatory Visit (INDEPENDENT_AMBULATORY_CARE_PROVIDER_SITE_OTHER): Payer: BLUE CROSS/BLUE SHIELD | Admitting: Psychiatry

## 2018-05-31 VITALS — BP 128/70 | Ht 67.0 in | Wt 222.0 lb

## 2018-05-31 DIAGNOSIS — F3131 Bipolar disorder, current episode depressed, mild: Secondary | ICD-10-CM

## 2018-05-31 DIAGNOSIS — F411 Generalized anxiety disorder: Secondary | ICD-10-CM

## 2018-05-31 MED ORDER — TRAZODONE HCL 100 MG PO TABS: ORAL_TABLET | ORAL | 0 refills | Status: DC

## 2018-05-31 MED ORDER — CLONAZEPAM 0.5 MG PO TABS: 0.5000 mg | ORAL_TABLET | Freq: Two times a day (BID) | ORAL | 2 refills | Status: DC

## 2018-05-31 MED ORDER — LITHIUM CARBONATE ER 450 MG PO TBCR: 450.0000 mg | EXTENDED_RELEASE_TABLET | Freq: Two times a day (BID) | ORAL | 2 refills | Status: DC
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
## 2018-08-15 ENCOUNTER — Other Ambulatory Visit (HOSPITAL_COMMUNITY): Payer: Self-pay | Admitting: Psychiatry

## 2018-08-15 DIAGNOSIS — F3131 Bipolar disorder, current episode depressed, mild: Secondary | ICD-10-CM

## 2018-08-16 ENCOUNTER — Emergency Department (HOSPITAL_COMMUNITY): Payer: BLUE CROSS/BLUE SHIELD

## 2018-08-16 ENCOUNTER — Inpatient Hospital Stay (HOSPITAL_COMMUNITY)
Admission: EM | Admit: 2018-08-16 | Discharge: 2018-08-18 | DRG: 690 | Disposition: A | Discharge status: Home / Self Care | Payer: BLUE CROSS/BLUE SHIELD | Attending: Internal Medicine | Admitting: Internal Medicine

## 2018-08-16 ENCOUNTER — Other Ambulatory Visit: Payer: Self-pay

## 2018-08-16 ENCOUNTER — Encounter (HOSPITAL_COMMUNITY): Payer: Self-pay | Admitting: Emergency Medicine

## 2018-08-16 DIAGNOSIS — Z791 Long term (current) use of non-steroidal anti-inflammatories (NSAID): Secondary | ICD-10-CM

## 2018-08-16 DIAGNOSIS — Z818 Family history of other mental and behavioral disorders: Secondary | ICD-10-CM

## 2018-08-16 DIAGNOSIS — R197 Diarrhea, unspecified: Secondary | ICD-10-CM | POA: Diagnosis present

## 2018-08-16 DIAGNOSIS — Z23 Encounter for immunization: Secondary | ICD-10-CM

## 2018-08-16 DIAGNOSIS — G894 Chronic pain syndrome: Secondary | ICD-10-CM | POA: Diagnosis present

## 2018-08-16 DIAGNOSIS — E876 Hypokalemia: Secondary | ICD-10-CM

## 2018-08-16 DIAGNOSIS — R112 Nausea with vomiting, unspecified: Secondary | ICD-10-CM | POA: Diagnosis present

## 2018-08-16 DIAGNOSIS — N390 Urinary tract infection, site not specified: Principal | ICD-10-CM

## 2018-08-16 DIAGNOSIS — F319 Bipolar disorder, unspecified: Secondary | ICD-10-CM | POA: Diagnosis present

## 2018-08-16 DIAGNOSIS — N179 Acute kidney failure, unspecified: Secondary | ICD-10-CM

## 2018-08-16 DIAGNOSIS — Z79899 Other long term (current) drug therapy: Secondary | ICD-10-CM

## 2018-08-16 DIAGNOSIS — Z79891 Long term (current) use of opiate analgesic: Secondary | ICD-10-CM | POA: Diagnosis not present

## 2018-08-16 DIAGNOSIS — Z888 Allergy status to other drugs, medicaments and biological substances status: Secondary | ICD-10-CM

## 2018-08-16 DIAGNOSIS — B9620 Unspecified Escherichia coli [E. coli] as the cause of diseases classified elsewhere: Secondary | ICD-10-CM | POA: Diagnosis present

## 2018-08-16 DIAGNOSIS — A419 Sepsis, unspecified organism: Secondary | ICD-10-CM | POA: Diagnosis present

## 2018-08-16 DIAGNOSIS — F17210 Nicotine dependence, cigarettes, uncomplicated: Secondary | ICD-10-CM | POA: Diagnosis present

## 2018-08-16 DIAGNOSIS — R319 Hematuria, unspecified: Secondary | ICD-10-CM

## 2018-08-16 LAB — COMPREHENSIVE METABOLIC PANEL
ALT: 42 U/L (ref 0–44)
AST: 33 U/L (ref 15–41)
Albumin: 3.8 g/dL (ref 3.5–5.0)
Alkaline Phosphatase: 107 U/L (ref 38–126)
Anion gap: 15 (ref 5–15)
BUN: 24 mg/dL — ABNORMAL HIGH (ref 6–20)
CO2: 29 mmol/L (ref 22–32)
Calcium: 10.2 mg/dL (ref 8.9–10.3)
Chloride: 92 mmol/L — ABNORMAL LOW (ref 98–111)
Creatinine, Ser: 1.33 mg/dL — ABNORMAL HIGH (ref 0.44–1.00)
GFR calc Af Amer: 55 mL/min — ABNORMAL LOW (ref >60)
GFR calc non Af Amer: 47 mL/min — ABNORMAL LOW (ref >60)
Glucose, Bld: 131 mg/dL — ABNORMAL HIGH (ref 70–99)
Potassium: 2.9 mmol/L — ABNORMAL LOW (ref 3.5–5.1)
Sodium: 136 mmol/L (ref 135–145)
Total Bilirubin: 1.4 mg/dL — ABNORMAL HIGH (ref 0.3–1.2)
Total Protein: 6.6 g/dL (ref 6.5–8.1)

## 2018-08-16 LAB — URINALYSIS, ROUTINE W REFLEX MICROSCOPIC
Bilirubin Urine: NEGATIVE
Glucose, UA: NEGATIVE mg/dL
Hgb urine dipstick: NEGATIVE
Ketones, ur: 5 mg/dL — AB
Nitrite: POSITIVE — AB
Protein, ur: 30 mg/dL — AB
Specific Gravity, Urine: 1.011 (ref 1.005–1.030)
pH: 6 (ref 5.0–8.0)

## 2018-08-16 LAB — CBC
HCT: 49.5 % — ABNORMAL HIGH (ref 36.0–46.0)
HCT: 45.3 % (ref 36.0–46.0)
Hemoglobin: 16.1 g/dL — ABNORMAL HIGH (ref 12.0–15.0)
Hemoglobin: 14.5 g/dL (ref 12.0–15.0)
MCH: 29.6 pg (ref 26.0–34.0)
MCH: 29.8 pg (ref 26.0–34.0)
MCHC: 32.5 g/dL (ref 30.0–36.0)
MCHC: 32 g/dL (ref 30.0–36.0)
MCV: 91 fL (ref 80.0–100.0)
MCV: 93 fL (ref 80.0–100.0)
NRBC: 0 % (ref 0.0–0.2)
NRBC: 0 % (ref 0.0–0.2)
Platelets: 356 10*3/uL (ref 150–400)
Platelets: 305 10*3/uL (ref 150–400)
RBC: 5.44 MIL/uL — ABNORMAL HIGH (ref 3.87–5.11)
RBC: 4.87 MIL/uL (ref 3.87–5.11)
RDW: 13.2 % (ref 11.5–15.5)
RDW: 13.2 % (ref 11.5–15.5)
WBC: 29.1 10*3/uL — ABNORMAL HIGH (ref 4.0–10.5)
WBC: 24 10*3/uL — ABNORMAL HIGH (ref 4.0–10.5)

## 2018-08-16 LAB — CREATININE, SERUM
Creatinine, Ser: 0.98 mg/dL (ref 0.44–1.00)
GFR calc non Af Amer: >60 mL/min (ref >60)

## 2018-08-16 LAB — I-STAT BETA HCG BLOOD, ED (MC, WL, AP ONLY): I-stat hCG, quantitative: <5 m[IU]/mL (ref <5)

## 2018-08-16 LAB — LIPASE, BLOOD: Lipase: 41 U/L (ref 11–51)

## 2018-08-16 LAB — I-STAT CG4 LACTIC ACID, ED: Lactic Acid, Venous: 1.1 mmol/L (ref 0.5–1.9)

## 2018-08-16 LAB — LITHIUM LEVEL: Lithium Lvl: 1.06 mmol/L (ref 0.60–1.20)

## 2018-08-16 MED ORDER — POTASSIUM CHLORIDE 10 MEQ/100ML IV SOLN
10.0000 meq | Freq: Once | INTRAVENOUS | Status: AC
  Administered 2018-08-16: 10 meq via INTRAVENOUS
  Filled 2018-08-16: qty 100

## 2018-08-16 MED ORDER — SODIUM CHLORIDE 0.9 % IV SOLN: 1.0000 g | INTRAVENOUS | Status: DC

## 2018-08-16 MED ORDER — SODIUM CHLORIDE 0.9 % IV BOLUS
1000.0000 mL | Freq: Once | INTRAVENOUS | Status: AC
  Administered 2018-08-16: 1000 mL via INTRAVENOUS

## 2018-08-16 MED ORDER — MORPHINE SULFATE (PF) 4 MG/ML IV SOLN
4.0000 mg | Freq: Once | INTRAVENOUS | Status: AC
  Administered 2018-08-16: 4 mg via INTRAVENOUS
  Filled 2018-08-16: qty 1

## 2018-08-16 MED ORDER — GABAPENTIN 300 MG PO CAPS
600.0000 mg | ORAL_CAPSULE | Freq: Every day | ORAL | Status: DC
  Administered 2018-08-16 – 2018-08-17 (×2): 600 mg via ORAL
  Filled 2018-08-16 (×2): qty 2

## 2018-08-16 MED ORDER — SODIUM CHLORIDE 0.9 % IV SOLN
1.0000 g | Freq: Once | INTRAVENOUS | Status: AC
  Administered 2018-08-16: 1 g via INTRAVENOUS
  Filled 2018-08-16: qty 10

## 2018-08-16 MED ORDER — ALBUTEROL SULFATE HFA 108 (90 BASE) MCG/ACT IN AERS
1.0000 | INHALATION_SPRAY | Freq: Four times a day (QID) | RESPIRATORY_TRACT | Status: DC | PRN
  Filled 2018-08-16: qty 6.7

## 2018-08-16 MED ORDER — ALBUTEROL SULFATE (2.5 MG/3ML) 0.083% IN NEBU: 2.5000 mg | INHALATION_SOLUTION | Freq: Four times a day (QID) | RESPIRATORY_TRACT | Status: DC | PRN

## 2018-08-16 MED ORDER — ENOXAPARIN SODIUM 40 MG/0.4ML ~~LOC~~ SOLN
40.0000 mg | SUBCUTANEOUS | Status: DC
  Administered 2018-08-16 – 2018-08-17 (×2): 40 mg via SUBCUTANEOUS
  Filled 2018-08-16 (×2): qty 0.4

## 2018-08-16 MED ORDER — ONDANSETRON HCL 4 MG/2ML IJ SOLN
4.0000 mg | Freq: Once | INTRAMUSCULAR | Status: AC
  Administered 2018-08-16: 4 mg via INTRAVENOUS
  Filled 2018-08-16: qty 2

## 2018-08-16 MED ORDER — POTASSIUM CHLORIDE 20 MEQ/15ML (10%) PO SOLN
40.0000 meq | Freq: Two times a day (BID) | ORAL | Status: AC
  Administered 2018-08-16 – 2018-08-17 (×2): 40 meq via ORAL
  Filled 2018-08-16 (×2): qty 30

## 2018-08-16 MED ORDER — LORATADINE 10 MG PO TABS: 10.0000 mg | ORAL_TABLET | Freq: Every day | ORAL | Status: DC | PRN

## 2018-08-16 MED ORDER — SODIUM CHLORIDE 0.9 % IV SOLN
INTRAVENOUS | Status: DC
  Administered 2018-08-16 – 2018-08-18 (×5): via INTRAVENOUS

## 2018-08-16 MED ORDER — BUPRENORPHINE 20 MCG/HR TD PTWK: 20.0000 ug | MEDICATED_PATCH | TRANSDERMAL | Status: DC

## 2018-08-16 MED ORDER — OXYCODONE HCL 5 MG PO TABS
15.0000 mg | ORAL_TABLET | Freq: Four times a day (QID) | ORAL | Status: DC | PRN
  Administered 2018-08-16 – 2018-08-18 (×7): 15 mg via ORAL
  Filled 2018-08-16 (×7): qty 3

## 2018-08-16 MED ORDER — ONDANSETRON HCL 4 MG PO TABS
4.0000 mg | ORAL_TABLET | Freq: Four times a day (QID) | ORAL | Status: DC | PRN
  Administered 2018-08-17 (×2): 4 mg via ORAL
  Filled 2018-08-16 (×3): qty 1

## 2018-08-16 MED ORDER — TRAZODONE HCL 100 MG PO TABS
100.0000 mg | ORAL_TABLET | Freq: Every evening | ORAL | Status: DC | PRN
  Administered 2018-08-17 (×2): 100 mg via ORAL
  Filled 2018-08-16 (×2): qty 1

## 2018-08-16 MED ORDER — CLONAZEPAM 0.5 MG PO TABS
0.5000 mg | ORAL_TABLET | Freq: Two times a day (BID) | ORAL | Status: DC | PRN
  Administered 2018-08-16 – 2018-08-18 (×4): 0.5 mg via ORAL
  Filled 2018-08-16 (×4): qty 1

## 2018-08-16 MED ORDER — ONDANSETRON HCL 4 MG/2ML IJ SOLN
4.0000 mg | Freq: Four times a day (QID) | INTRAMUSCULAR | Status: DC | PRN
  Administered 2018-08-16 – 2018-08-17 (×3): 4 mg via INTRAVENOUS
  Filled 2018-08-16 (×3): qty 2

## 2018-08-16 MED ORDER — ACETAMINOPHEN 650 MG RE SUPP: 650.0000 mg | Freq: Four times a day (QID) | RECTAL | Status: DC | PRN

## 2018-08-16 MED ORDER — ACETAMINOPHEN 325 MG PO TABS: 650.0000 mg | ORAL_TABLET | Freq: Four times a day (QID) | ORAL | Status: DC | PRN

## 2018-08-16 MED ORDER — SODIUM CHLORIDE 0.9 % IV SOLN
1.0000 g | INTRAVENOUS | Status: DC
  Administered 2018-08-17: 1 g via INTRAVENOUS
  Filled 2018-08-16 (×2): qty 10

## 2018-08-16 NOTE — ED Provider Notes (Signed)
[facility] Provider Note   CSN: [id number] Arrival date & time: 08/16/18  1306     History   Chief Complaint Chief Complaint  Patient presents with  . Flank Pain  . Nausea    HPI Monica Hopkins is a 48 y.o. female.  HPI   48 year old female with a a past medical history of bipolar disorder, depression, anxiety, and chronic pain syndrome presents today with complaints of urinary tract infection.  Patient notes symptoms started shortly before Christmas approximately 4 weeks ago.  She notes urinary symptoms with burning with urination and odor and change in color.  She notes usually when she has urinary tract infections they go away on their own.  She notes this 1 has persisted, she notes 2 weeks ago she had an upper respiratory infection with cough and fatigue.  She notes around that same time she developed nausea vomiting and diarrhea.  She notes that over the last several days she has not had vomiting or diarrhea but has not been able to tolerate solid foods has had extreme fatigue and continued urinary symptoms.  She notes pain in her bilateral lower back.  She denies fever.  She was seen by her primary care provider Dr. Shelah LewandowskyElkin yesterday who diagnosed her with urinary tract infection she was started on Bactrim and antinausea medication, she notes continued worsening of symptoms.   Past Medical History:  Diagnosis Date  . Prolapse, disk     Patient Active Problem List   Diagnosis Date Noted  . Cervical postlaminectomy syndrome 07/10/2015  . Chronic pain syndrome 07/10/2015    Past Surgical History:  Procedure Laterality Date  . NECK SURGERY       OB History   No obstetric history on file.      Home Medications    Prior to Admission medications   Medication Sig Start Date End Date Taking? Authorizing Provider  albuterol (PROVENTIL HFA) 108 (90 Base) MCG/ACT inhaler Inhale 1-2 puffs into the lungs every 6 (six) hours as needed for  wheezing or shortness of breath.   Yes [provider]  buprenorphine (BUTRANS - DOSED MCG/HR) 20 MCG/HR PTWK patch Place 20 mcg onto the skin every Friday.   Yes [provider]  clonazePAM (KLONOPIN) 0.5 MG tablet Take 1 tablet (0.5 mg total) by mouth 2 (two) times daily. 05/31/18  Yes Arfeen, Phillips GroutSyed T, MD  gabapentin (NEURONTIN) 800 MG tablet Take 800 mg by mouth 3 (three) times daily. 10/20/17  Yes [provider]  ibuprofen (ADVIL,MOTRIN) 200 MG tablet Take 800 mg by mouth 2 (two) times daily as needed (for pain).   Yes [provider]  lithium carbonate (ESKALITH) 450 MG CR tablet Take 1 tablet (450 mg total) by mouth 2 (two) times daily. 05/31/18 05/31/19 Yes Arfeen, Phillips GroutSyed T, MD  loratadine (CLARITIN) 10 MG tablet Take 10 mg by mouth daily as needed for allergies or rhinitis.    Yes [provider]  ondansetron (ZOFRAN) 8 MG tablet Take 8 mg by mouth every 8 (eight) hours as needed for nausea or vomiting.   Yes [provider]  sulfamethoxazole-trimethoprim (BACTRIM DS,SEPTRA DS) 800-160 MG tablet Take 1 tablet by mouth every 12 (twelve) hours. FOR 5 DAYS 08/15/18 08/19/18 Yes [provider]  tiZANidine (ZANAFLEX) 4 MG tablet Take 4 mg by mouth 3 (three) times daily. 12/15/13  Yes [provider]  traZODone (DESYREL) 100 MG tablet TAKE 1 TABLET BY MOUTH AT BEDTIME Patient taking differently:  Take 100 mg by mouth at bedtime.  05/31/18  Yes Arfeen, Phillips GroutSyed T, MD  metroNIDAZOLE (FLAGYL) 500 MG tablet Take 500 mg by mouth every 8 (eight) hours. FOR 10 DAYS 08/15/18 08/24/18  [provider]  oxyCODONE (ROXICODONE) 15 MG immediate release tablet Take 15 mg by mouth every 6 (six) hours as needed. for pain 09/29/17   [provider]    Family History Family History  Problem Relation Age of Onset  . Mental illness Mother   . Mental illness Sister     Social History Social History   Tobacco Use  . Smoking status:  Current Every Day Smoker    Packs/day: 1.00    Years: 9.00    Pack years: 9.00  . Smokeless tobacco: Never Used  . Tobacco comment: still trying to cut back and quit on her own.   Substance Use Topics  . Alcohol use: No  . Drug use: No     Allergies   Abilify [aripiprazole]; Lamictal [lamotrigine]; and Latuda [lurasidone]   Review of Systems Review of Systems  All other systems reviewed and are negative.    Physical Exam Updated Vital Signs BP 116/70 (BP Location: Right Arm)   Pulse 93   Temp 98.6 F (37 C) (Oral)   Resp 20   SpO2 98%   Physical Exam Vitals signs and nursing note reviewed.  Constitutional:      Appearance: She is well-developed.  HENT:     Head: Normocephalic and atraumatic.     Comments: Dry mucous membranes Eyes:     General: No scleral icterus.       Right eye: No discharge.        Left eye: No discharge.     Conjunctiva/sclera: Conjunctivae normal.     Pupils: Pupils are equal, round, and reactive to light.  Neck:     Musculoskeletal: Normal range of motion.     Vascular: No JVD.     Trachea: No tracheal deviation.  Cardiovascular:     Rate and Rhythm: Tachycardia present.  Pulmonary:     Effort: Pulmonary effort is normal. No respiratory distress.     Breath sounds: No stridor. No wheezing, rhonchi or rales.  Abdominal:     Comments: Abdomen soft nontender-bilateral CVA tenderness  Neurological:     Mental Status: She is alert and oriented to person, place, and time.     Coordination: Coordination normal.  Psychiatric:        Behavior: Behavior normal.        Thought Content: Thought content normal.        Judgment: Judgment normal.      ED Treatments / Results  Labs (all labs ordered are listed, but only abnormal results are displayed) Labs Reviewed  URINALYSIS, ROUTINE W REFLEX MICROSCOPIC - Abnormal; Notable for the following components:      Result Value   Color, Urine AMBER (*)    Ketones, ur 5 (*)    Protein, ur 30  (*)    Nitrite POSITIVE (*)    Leukocytes, UA TRACE (*)    Bacteria, UA MANY (*)    All other components within normal limits  CBC - Abnormal; Notable for the following components:   WBC 29.1 (*)    RBC 5.44 (*)    Hemoglobin 16.1 (*)    HCT 49.5 (*)    All other components within normal limits  COMPREHENSIVE METABOLIC PANEL - Abnormal; Notable for the following components:   Potassium 2.9 (*)  Chloride 92 (*)    Glucose, Bld 131 (*)    BUN 24 (*)    Creatinine, Ser 1.33 (*)    Total Bilirubin 1.4 (*)    GFR calc non Af Amer 47 (*)    GFR calc Af Amer 55 (*)    All other components within normal limits  URINE CULTURE  CULTURE, BLOOD (ROUTINE X 2)  CULTURE, BLOOD (ROUTINE X 2)  LIPASE, BLOOD  I-STAT BETA HCG BLOOD, ED ([facility], [facility], AP ONLY)  I-STAT CG4 LACTIC ACID, ED    EKG None  Radiology Dg Chest 2 View  Result Date: 08/16/2018 CLINICAL DATA:  Nausea, vomiting and dizziness for 2 days. EXAM: CHEST - 2 VIEW COMPARISON:  CT chest, abdomen and pelvis 04/28/2014. PA and lateral chest 02/10/2014. FINDINGS: And lungs are clear. Heart size is normal. No pneumothorax or pleural fluid. Lower thoracic and upper lumbar spondylosis noted. No acute bony abnormality. IMPRESSION: No acute disease. Electronically Signed   By: Drusilla Kanner M.D.   On: 08/16/2018 14:29    Procedures Procedures (including critical care time)  Medications Ordered in ED Medications  potassium chloride 10 mEq in 100 mL IVPB (10 mEq Intravenous New Bag/Given 08/16/18 1540)  sodium chloride 0.9 % bolus 1,000 mL (1,000 mLs Intravenous New Bag/Given 08/16/18 1602)  cefTRIAXone (ROCEPHIN) 1 g in sodium chloride 0.9 % 100 mL IVPB (1 g Intravenous New Bag/Given 08/16/18 1626)  sodium chloride 0.9 % bolus 1,000 mL (0 mLs Intravenous Stopped 08/16/18 1602)  ondansetron (ZOFRAN) injection 4 mg (4 mg Intravenous Given 08/16/18 1354)  morphine 4 MG/ML injection 4 mg (4 mg Intravenous Given 08/16/18 1354)  morphine 4 MG/ML  injection 4 mg (4 mg Intravenous Given 08/16/18 1603)     Initial Impression / Assessment and Plan / ED Course  I have reviewed the triage vital signs and the nursing notes.  Pertinent labs & imaging results that were available during my care of the patient were reviewed by me and considered in my medical decision making (see chart for details).     Labs: Blood culture, i-STAT beta-hCG, urine, urinalysis, CBC CMP, lipase  Imaging:  Consults: [facility]  Therapeutics: Potassium, normal saline, ceftriaxone  Discharge Meds:   Assessment/Plan: 48 year old female presents today with likely urinary tract infection.  She was seen as an outpatient with positive urinalysis.  She has dry mucous membranes here, appears ill.  She has tachycardia elevated white count at 29.1.  I do feel patient would benefit from inpatient management with antibiotics hydration at this time.  She was able to tolerate a small amount of fluid here with antiemetics.    Final Clinical Impressions(s) / ED Diagnoses   Final diagnoses:  Urinary tract infection with hematuria, site unspecified  Hypokalemia  AKI (acute kidney injury) [facility])    ED Discharge Orders    None       Eyvonne Mechanic, PA-C 08/16/18 1631    Wynetta Fines, MD 08/16/18 [phone]

## 2018-08-16 NOTE — ED Notes (Signed)
Patient transported to x-ray. ?

## 2018-08-16 NOTE — H&P (Signed)
History and Physical    Monica Hopkins UOH:[id number] DOB: June 17, 1971 DOA: 08/16/2018  Referring MD/NP/PA: EDP PCP:  Patient coming from: Home  Chief Complaint: Weakness  HPI: Monica Hopkins is a 48 y.o. female with medical history significant of bipolar disorder on lithium, chronic pain syndrome, followed by pain clinic on buprenorphine patch and oxycodone at baseline, anxiety, depression, has been sick off and on for the past month, patient is very poor historian. -Patient and spouse report that she initially got sick about a month ago with some mild urinary symptoms namely dysuria and urinary frequency following this 2 weeks ago she developed an upper respiratory symptoms infection and flulike symptoms, stayed home and rested and slowly got better from this standpoint, over the last 7 to 10 days developed more dysuria, urinary frequency, bilateral flank pain in the last couple of days. -In addition also reported poor p.o. intake, inability to take her chronic oxycodone, some nausea vomiting and diarrhea in the last week. -She went to her PCPs office yesterday was finally diagnosed with a urinary infection, she was started on Bactrim, urine culture was collected, subsequently went home however due to worsening weakness, limited ability to take medications, nausea she presented to the ER today ED Course: Vital signs were stable, labs notable for leukocytosis of 29,000, mild AKI with creatinine of 1.3, potassium of 2.9, lactate of 1.10, blood glucose was 131, urinalysis was grossly abnormal with positive nitrite, leukocyte Estrace, many bacteria and numerous WBC  Review of Systems: As per HPI otherwise 14 point review of systems negative, she reports that her upper respiratory infection infection type symptoms have much improved now, diarrhea has improved and vomiting also has resolved  Past Medical History:  Diagnosis Date  . Prolapse, disk     Past Surgical History:  Procedure Laterality Date   . NECK SURGERY       reports that she has been smoking. She has a 9.00 pack-year smoking history. She has never used smokeless tobacco. She reports that she does not drink alcohol or use drugs.  Allergies  Allergen Reactions  . Abilify [Aripiprazole] Swelling and Rash  . Lamictal [Lamotrigine] Swelling and Rash  . Latuda [Lurasidone] Other (See Comments)    Caused leg weakness    Family History  Problem Relation Age of Onset  . Mental illness Mother   . Mental illness Sister      Prior to Admission medications   Medication Sig Start Date End Date Taking? Authorizing Provider  albuterol (PROVENTIL HFA) 108 (90 Base) MCG/ACT inhaler Inhale 1-2 puffs into the lungs every 6 (six) hours as needed for wheezing or shortness of breath.   Yes [provider]  buprenorphine (BUTRANS - DOSED MCG/HR) 20 MCG/HR PTWK patch Place 20 mcg onto the skin every Friday.   Yes [provider]  clonazePAM (KLONOPIN) 0.5 MG tablet Take 1 tablet (0.5 mg total) by mouth 2 (two) times Monica. 05/31/18  Yes Arfeen, Phillips Grout, MD  gabapentin (NEURONTIN) 800 MG tablet Take 800 mg by mouth 3 (three) times Monica. 10/20/17  Yes [provider]  ibuprofen (ADVIL,MOTRIN) 200 MG tablet Take 800 mg by mouth 2 (two) times Monica as needed (for pain).   Yes [provider]  lithium carbonate (ESKALITH) 450 MG CR tablet Take 1 tablet (450 mg total) by mouth 2 (two) times Monica. 05/31/18 05/31/19 Yes Arfeen, Phillips Grout, MD  loratadine (CLARITIN) 10 MG tablet Take 10 mg by mouth Monica as needed for allergies or rhinitis.  Yes [provider]  ondansetron (ZOFRAN) 8 MG tablet Take 8 mg by mouth every 8 (eight) hours as needed for nausea or vomiting.   Yes [provider]  Phenylephrine-APAP-guaiFENesin (MUCINEX FAST-MAX PO) Take 15 mLs by mouth every 6 (six) hours as needed (for symptoms/congestion).   Yes [provider]  sulfamethoxazole-trimethoprim (BACTRIM DS,SEPTRA  DS) 800-160 MG tablet Take 1 tablet by mouth every 12 (twelve) hours. FOR 5 DAYS 08/15/18 08/19/18 Yes [provider]  tiZANidine (ZANAFLEX) 4 MG tablet Take 4 mg by mouth 3 (three) times Monica. 12/15/13  Yes [provider]  traZODone (DESYREL) 100 MG tablet TAKE 1 TABLET BY MOUTH AT BEDTIME Patient taking differently: Take 100 mg by mouth at bedtime.  05/31/18  Yes Arfeen, Phillips Grout, MD  metroNIDAZOLE (FLAGYL) 500 MG tablet Take 500 mg by mouth every 8 (eight) hours. FOR 10 DAYS 08/15/18 08/24/18  [provider]  oxyCODONE (ROXICODONE) 15 MG immediate release tablet Take 15 mg by mouth every 6 (six) hours as needed. for pain 09/29/17   [provider]    Physical Exam: Vitals:   08/16/18 1350 08/16/18 1400 08/16/18 1400 08/16/18 1543  BP: 113/77 112/81  116/70  Pulse: (!) 104 94  93  Resp: 18   20  Temp:      TempSrc:      SpO2: 91% 96% 96% 98%      Constitutional: Ill appearing female, sitting up in recliner, no distress, AAO x3 Vitals:   08/16/18 1350 08/16/18 1400 08/16/18 1400 08/16/18 1543  BP: 113/77 112/81  116/70  Pulse: (!) 104 94  93  Resp: 18   20  Temp:      TempSrc:      SpO2: 91% 96% 96% 98%   Eyes: PERRL, lids and conjunctivae normal ENMT: Oral mucosa is dry Neck: normal, supple Respiratory: Poor air movement bilaterally, no wheezes or rhonchi Cardiovascular: Regular rate and rhythm, no murmurs / rubs / gallops Abdomen: Soft, nontender anteriorly, mild bilateral flank tenderness elicited, bowel sounds present Musculoskeletal: No joint deformity upper and lower extremities. Ext: No edema Skin: no rashes, lesions, ulcers.  Neurologic: CN 2-12 grossly intact. Sensation intact, DTR normal. Strength 5/5 in all 4.  Psychiatric: Normal judgment and insight. Alert and oriented x 3. Normal mood.   Labs on Admission: I have personally reviewed following labs and imaging studies  CBC: Recent Labs  Lab 08/16/18 1345  WBC 29.1*  HGB  16.1*  HCT 49.5*  MCV 91.0  PLT 356   Basic Metabolic Panel: Recent Labs  Lab 08/16/18 1345  NA 136  K 2.9*  CL 92*  CO2 29  GLUCOSE 131*  BUN 24*  CREATININE 1.33*  CALCIUM 10.2   GFR: CrCl cannot be calculated (Unknown ideal weight.). Liver Function Tests: Recent Labs  Lab 08/16/18 1345  AST 33  ALT 42  ALKPHOS 107  BILITOT 1.4*  PROT 6.6  ALBUMIN 3.8   Recent Labs  Lab 08/16/18 1345  LIPASE 41   No results for input(s): AMMONIA in the last 168 hours. Coagulation Profile: No results for input(s): INR, PROTIME in the last 168 hours. Cardiac Enzymes: No results for input(s): CKTOTAL, CKMB, CKMBINDEX, TROPONINI in the last 168 hours. BNP (last 3 results) No results for input(s): PROBNP in the last 8760 hours. HbA1C: No results for input(s): HGBA1C in the last 72 hours. CBG: No results for input(s): GLUCAP in the last 168 hours. Lipid Profile: No results for input(s): CHOL, HDL,  LDLCALC, TRIG, CHOLHDL, LDLDIRECT in the last 72 hours. Thyroid Function Tests: No results for input(s): TSH, T4TOTAL, FREET4, T3FREE, THYROIDAB in the last 72 hours. Anemia Panel: No results for input(s): VITAMINB12, FOLATE, FERRITIN, TIBC, IRON, RETICCTPCT in the last 72 hours. Urine analysis:    Component Value Date/Time   COLORURINE AMBER (A) 08/16/2018 1345   APPEARANCEUR CLEAR 08/16/2018 1345   LABSPEC 1.011 08/16/2018 1345   PHURINE 6.0 08/16/2018 1345   GLUCOSEU NEGATIVE 08/16/2018 1345   HGBUR NEGATIVE 08/16/2018 1345   BILIRUBINUR NEGATIVE 08/16/2018 1345   KETONESUR 5 (A) 08/16/2018 1345   PROTEINUR 30 (A) 08/16/2018 1345   UROBILINOGEN 1.0 01/25/2014 2305   NITRITE POSITIVE (A) 08/16/2018 1345   LEUKOCYTESUR TRACE (A) 08/16/2018 1345   Sepsis Labs: @LABRCNTIP (procalcitonin:4,lacticidven:4) )No results found for this or any previous visit (from the past 240 hour(s)).   Radiological Exams on Admission: Dg Chest 2 View  Result Date: 08/16/2018 CLINICAL DATA:   Nausea, vomiting and dizziness for 2 days. EXAM: CHEST - 2 VIEW COMPARISON:  CT chest, abdomen and pelvis 04/28/2014. PA and lateral chest 02/10/2014. FINDINGS: And lungs are clear. Heart size is normal. No pneumothorax or pleural fluid. Lower thoracic and upper lumbar spondylosis noted. No acute bony abnormality. IMPRESSION: No acute disease. Electronically Signed   By: Drusilla Kannerhomas  Dalessio M.D.   On: 08/16/2018 14:29     Assessment/Plan Principal Problem:    Sepsis -Most likely secondary to urinary infection, she definitely has symptoms of dysuria urinary frequency, foul-smelling urine associated with a grossly abnormal UA with positive nitrite leukoesterase many bacteria and WBC -Could have early pyelonephritis as well -Start IV ceftriaxone, normal saline at 100 cc an hour for today -Will attempt to obtain urine culture done at PCPs office tomorrow  Mild acute kidney injury -Likely due to sepsis and prerenal from decreased oral intake and GI losses recently -Hydrate, monitor  Recent nausea vomiting and some diarrhea -I suspect this could be secondary to her above UTI and sepsis in addition she could have had mild narcotic withdrawal when she was unable to take her oxycodone recently, this seems to have improved -Resume home regimen of oxycodone with buprenorphine patch, patient on this unusual regimen for at least a year, followed at the pain clinic  Bipolar disorder -On lithium, will hold lithium until level obtained -She has been unable to take this medicine off and on recently due to above-mentioned GI symptoms -Also with acute kidney injury she could have elevated lithium levels, will follow  Chronic pain syndrome -Followed at the pain clinic, on oxycodone and buprenorphine patch which is resumed -Cut down gabapentin dose due to acute kidney injury  DVT prophylaxis: Lovenox Code Status: Full code Family Communication: Discussed with husband at bedside Disposition Plan: Home  pending clinical improvement less likely 48 hours Consults called: None Admission status: Inpatient   Zannie CovePreetha Zeus Marquis MD [facility] Pager [phone]- [phone]  If 7PM-7AM, please contact night-coverage [email or web address] Password [email or web address]  08/16/2018, 4:59 PM

## 2018-08-16 NOTE — ED Triage Notes (Signed)
Pt from PCP for bilateral flank pain and nausea. She reports being started on antibiotics, has only had one dose. Reports vomiting yesterday that has improved but nausea persist.

## 2018-08-17 ENCOUNTER — Other Ambulatory Visit: Payer: Self-pay

## 2018-08-17 DIAGNOSIS — A419 Sepsis, unspecified organism: Secondary | ICD-10-CM

## 2018-08-17 LAB — COMPREHENSIVE METABOLIC PANEL
ALT: 31 U/L (ref 0–44)
AST: 22 U/L (ref 15–41)
Albumin: 2.7 g/dL — ABNORMAL LOW (ref 3.5–5.0)
Alkaline Phosphatase: 74 U/L (ref 38–126)
Anion gap: 7 (ref 5–15)
BUN: 11 mg/dL (ref 6–20)
CHLORIDE: 104 mmol/L (ref 98–111)
CO2: 29 mmol/L (ref 22–32)
CREATININE: 0.77 mg/dL (ref 0.44–1.00)
Calcium: 8.9 mg/dL (ref 8.9–10.3)
GFR calc Af Amer: >60 mL/min (ref >60)
GFR calc non Af Amer: >60 mL/min (ref >60)
Glucose, Bld: 109 mg/dL — ABNORMAL HIGH (ref 70–99)
Potassium: 2.8 mmol/L — ABNORMAL LOW (ref 3.5–5.1)
Sodium: 140 mmol/L (ref 135–145)
Total Bilirubin: 0.7 mg/dL (ref 0.3–1.2)
Total Protein: 4.8 g/dL — ABNORMAL LOW (ref 6.5–8.1)

## 2018-08-17 LAB — CBC
HCT: 39.9 % (ref 36.0–46.0)
Hemoglobin: 12.8 g/dL (ref 12.0–15.0)
MCH: 30 pg (ref 26.0–34.0)
MCHC: 32.1 g/dL (ref 30.0–36.0)
MCV: 93.4 fL (ref 80.0–100.0)
Platelets: 260 10*3/uL (ref 150–400)
RBC: 4.27 MIL/uL (ref 3.87–5.11)
RDW: 13.2 % (ref 11.5–15.5)
WBC: 17.2 10*3/uL — ABNORMAL HIGH (ref 4.0–10.5)
nRBC: 0 % (ref 0.0–0.2)

## 2018-08-17 LAB — MAGNESIUM: Magnesium: 1.7 mg/dL (ref 1.7–2.4)

## 2018-08-17 LAB — HIV ANTIBODY (ROUTINE TESTING W REFLEX): HIV Screen 4th Generation wRfx: NONREACTIVE

## 2018-08-17 LAB — POTASSIUM: Potassium: 3.5 mmol/L (ref 3.5–5.1)

## 2018-08-17 MED ORDER — FLUCONAZOLE 150 MG PO TABS
150.0000 mg | ORAL_TABLET | Freq: Once | ORAL | Status: AC
  Administered 2018-08-17: 150 mg via ORAL
  Filled 2018-08-17: qty 1

## 2018-08-17 MED ORDER — INFLUENZA VAC SPLIT QUAD 0.5 ML IM SUSY
0.5000 mL | PREFILLED_SYRINGE | INTRAMUSCULAR | Status: AC
  Administered 2018-08-18: 0.5 mL via INTRAMUSCULAR
  Filled 2018-08-17: qty 0.5

## 2018-08-17 MED ORDER — POTASSIUM CHLORIDE 10 MEQ/100ML IV SOLN
10.0000 meq | INTRAVENOUS | Status: AC
  Administered 2018-08-17 (×5): 10 meq via INTRAVENOUS
  Filled 2018-08-17 (×8): qty 100

## 2018-08-17 MED ORDER — PANTOPRAZOLE SODIUM 40 MG PO TBEC
40.0000 mg | DELAYED_RELEASE_TABLET | Freq: Every day | ORAL | Status: DC
  Administered 2018-08-17 – 2018-08-18 (×2): 40 mg via ORAL
  Filled 2018-08-17 (×2): qty 1

## 2018-08-17 MED ORDER — MAGNESIUM SULFATE 2 GM/50ML IV SOLN
2.0000 g | Freq: Once | INTRAVENOUS | Status: AC
  Administered 2018-08-17: 2 g via INTRAVENOUS
  Filled 2018-08-17: qty 50

## 2018-08-17 NOTE — Plan of Care (Signed)

## 2018-08-17 NOTE — Progress Notes (Signed)
PROGRESS NOTE    Monica Hopkins  EAV:[id number] DOB: 22-Jun-1971 DOA: 08/16/2018 PCP: Kaleen Mask, MD   Brief Narrative: Patient is a 9 old female with past medical history of bipolar disorder, chronic pain syndrome, followed by pain clinic on buprenorphine patch, anxiety, depression who presented with dysuria, increased urinary frequency, upper respiratory symptoms, poor oral intake.  She failed outpatient antibiotic therapy.  Presented with nausea to the emergency department.  Currently being treated for urinary tract infection.  Assessment & Plan:   Principal Problem:   Sepsis (HCC) Active Problems:   Bipolar 1 disorder (HCC)  Suspected sepsis: Secondary to urinary tract infection.  Blood cultures has been sent, will follow.  She is hemodynamically stable at present.  Urinary tract infection: Presented with symptoms of dysuria, increased frequency.  Failed antibiotic as an outpatient.  Urinalysis was grossly abnormal.  Urine culture has been sent and showing E. coli.  Will follow final culture report.  Continue ceftriaxone for now.  Severe hypokalemia: Magnesium of 1.7.  Will supplement both potassium and magnesium.  We will continue to monitor the levels.  Acute kidney injury: Resolved with IV fluids  Nausea/vomiting/diarrhea: Resolved  Bipolar disorder: On lithium at home.  Chronic pain syndrome: Follows at pain clinic.  On oxycodone and buprenorphine patch           DVT prophylaxis: Lovenox Code Status: Full Family Communication: Family member present at the bedside Disposition Plan: Home tomorrow   Consultants: None  Procedures: None  Antimicrobials: Ceftriaxone day 2  Subjective: Patient seen and examined at bedside this afternoon.  Currently feels much better.  Dysuria symptoms have improved.  Hemodynamically stable.  Objective: Vitals:   08/16/18 2335 08/17/18 0342 08/17/18 0739 08/17/18 1141  BP: 105/68 110/62 (!) 100/58 (!) 94/58  Pulse: 83  88 67 79  Resp: 18 20    Temp: 98 F (36.7 C) 98.3 F (36.8 C) 98.3 F (36.8 C) 98.1 F (36.7 C)  TempSrc: Oral Oral Oral Oral  SpO2: 98% 98% 99% 97%  Weight:      Height:        Intake/Output Summary (Last 24 hours) at 08/17/2018 1328 Last data filed at 08/17/2018 0300 Gross per 24 hour  Intake 1915.79 ml  Output -  Net 1915.79 ml   Filed Weights   08/16/18 2051  Weight: 87.4 kg    Examination:  General exam: Appears calm and comfortable ,Not in distress,average built HEENT:PERRL,Oral mucosa moist, Ear/Nose normal on gross exam Respiratory system: Bilateral equal air entry, normal vesicular breath sounds, no wheezes or crackles  Cardiovascular system: S1 & S2 heard, RRR. No JVD, murmurs, rubs, gallops or clicks. No pedal edema. Gastrointestinal system: Abdomen is nondistended, soft and nontender. No organomegaly or masses felt. Normal bowel sounds heard. Central nervous system: Alert and oriented. No focal neurological deficits. Extremities: No edema, no clubbing ,no cyanosis, distal peripheral pulses palpable. Skin: No rashes, lesions or ulcers,no icterus ,no pallor MSK: Normal muscle bulk,tone ,power Psychiatry: Judgement and insight appear normal. Mood & affect appropriate.     Data Reviewed: I have personally reviewed following labs and imaging studies  CBC: Recent Labs  Lab 08/16/18 1345 08/16/18 2021 08/17/18 0442  WBC 29.1* 24.0* 17.2*  HGB 16.1* 14.5 12.8  HCT 49.5* 45.3 39.9  MCV 91.0 93.0 93.4  PLT 356 305 260   Basic Metabolic Panel: Recent Labs  Lab 08/16/18 1345 08/16/18 2021 08/17/18 0442 08/17/18 0855  NA 136  --  140  --  K 2.9*  --  2.8*  --   CL 92*  --  104  --   CO2 29  --  29  --   GLUCOSE 131*  --  109*  --   BUN 24*  --  11  --   CREATININE 1.33* 0.98 0.77  --   CALCIUM 10.2  --  8.9  --   MG  --   --   --  1.7   GFR: Estimated Creatinine Clearance: 98.7 mL/min (by C-G formula based on SCr of 0.77 mg/dL). Liver Function  Tests: Recent Labs  Lab 08/16/18 1345 08/17/18 0442  AST 33 22  ALT 42 31  ALKPHOS 107 74  BILITOT 1.4* 0.7  PROT 6.6 4.8*  ALBUMIN 3.8 2.7*   Recent Labs  Lab 08/16/18 1345  LIPASE 41   No results for input(s): AMMONIA in the last 168 hours. Coagulation Profile: No results for input(s): INR, PROTIME in the last 168 hours. Cardiac Enzymes: No results for input(s): CKTOTAL, CKMB, CKMBINDEX, TROPONINI in the last 168 hours. BNP (last 3 results) No results for input(s): PROBNP in the last 8760 hours. HbA1C: No results for input(s): HGBA1C in the last 72 hours. CBG: No results for input(s): GLUCAP in the last 168 hours. Lipid Profile: No results for input(s): CHOL, HDL, LDLCALC, TRIG, CHOLHDL, LDLDIRECT in the last 72 hours. Thyroid Function Tests: No results for input(s): TSH, T4TOTAL, FREET4, T3FREE, THYROIDAB in the last 72 hours. Anemia Panel: No results for input(s): VITAMINB12, FOLATE, FERRITIN, TIBC, IRON, RETICCTPCT in the last 72 hours. Sepsis Labs: Recent Labs  Lab 08/16/18 1545  LATICACIDVEN 1.10    Recent Results (from the past 240 hour(s))  Urine C&S     Status: Abnormal (Preliminary result)   Collection Time: 08/16/18  3:00 PM  Result Value Ref Range Status   Specimen Description URINE, CLEAN CATCH  Final   Special Requests   Final    NONE Performed at [facility], [address] [place], [place] [address]    Culture 50,000 COLONIES/mL ESCHERICHIA COLI (A)  Final   Report Status PENDING  Incomplete  Blood culture (routine x 2)     Status: None (Preliminary result)   Collection Time: 08/16/18  3:37 PM  Result Value Ref Range Status   Specimen Description BLOOD LEFT HAND  Final   Special Requests   Final    BOTTLES DRAWN AEROBIC ONLY Blood Culture results may not be optimal due to an inadequate volume of blood received in culture bottles   Culture   Final    NO GROWTH < 24 HOURS Performed at [facility], [address]  [place], [place] [address]    Report Status PENDING  Incomplete  Blood culture (routine x 2)     Status: None (Preliminary result)   Collection Time: 08/16/18  4:00 PM  Result Value Ref Range Status   Specimen Description BLOOD RIGHT HAND  Final   Special Requests   Final    BOTTLES DRAWN AEROBIC AND ANAEROBIC Blood Culture results may not be optimal due to an inadequate volume of blood received in culture bottles   Culture   Final    NO GROWTH < 24 HOURS Performed at [facility], [address] [place], [place] [address]    Report Status PENDING  Incomplete         Radiology Studies: Dg Chest 2 View  Result Date: 08/16/2018 CLINICAL DATA:  Nausea, vomiting  and dizziness for 2 days. EXAM: CHEST - 2 VIEW COMPARISON:  CT chest, abdomen and pelvis 04/28/2014. PA and lateral chest 02/10/2014. FINDINGS: And lungs are clear. Heart size is normal. No pneumothorax or pleural fluid. Lower thoracic and upper lumbar spondylosis noted. No acute bony abnormality. IMPRESSION: No acute disease. Electronically Signed   By: Drusilla Kannerhomas  Dalessio M.D.   On: 08/16/2018 14:29        Scheduled Meds: . [START ON 08/19/2018] buprenorphine  20 mcg Transdermal Q Fri  . enoxaparin (LOVENOX) injection  40 mg Subcutaneous Q24H  . gabapentin  600 mg Oral QHS  . [START ON 08/18/2018] Influenza vac split quadrivalent PF  0.5 mL Intramuscular Tomorrow-1000   Continuous Infusions: . sodium chloride 100 mL/hr at 08/17/18 0508  . cefTRIAXone (ROCEPHIN)  IV    . magnesium sulfate 1 - 4 g bolus IVPB       LOS: 1 day    Time spent: 35 mins.More than 50% of that time was spent in counseling and/or coordination of care.      Burnadette PopAmrit Tomica Arseneault, MD [facility] Pager [phone]  If 7PM-7AM, please contact night-coverage [email or web address] Password [email or web address] 08/17/2018, 1:28 PM

## 2018-08-17 NOTE — Progress Notes (Signed)
Received patient from ED at around 2100, alert and oriented, husband at bedside, call light within reach,personal items in reach, monitoring continues

## 2018-08-18 LAB — CBC WITH DIFFERENTIAL/PLATELET
Abs Immature Granulocytes: 0.07 10*3/uL (ref 0.00–0.07)
Basophils Absolute: 0.1 10*3/uL (ref 0.0–0.1)
Basophils Relative: 1 %
Eosinophils Absolute: 0.3 10*3/uL (ref 0.0–0.5)
Eosinophils Relative: 2 %
HCT: 45.3 % (ref 36.0–46.0)
Hemoglobin: 13.9 g/dL (ref 12.0–15.0)
Immature Granulocytes: 1 %
Lymphocytes Relative: 35 %
Lymphs Abs: 5 10*3/uL — ABNORMAL HIGH (ref 0.7–4.0)
MCH: 29 pg (ref 26.0–34.0)
MCHC: 30.7 g/dL (ref 30.0–36.0)
MCV: 94.6 fL (ref 80.0–100.0)
Monocytes Absolute: 0.8 10*3/uL (ref 0.1–1.0)
Monocytes Relative: 6 %
NEUTROS PCT: 55 %
Neutro Abs: 8 10*3/uL — ABNORMAL HIGH (ref 1.7–7.7)
Platelets: 257 10*3/uL (ref 150–400)
RBC: 4.79 MIL/uL (ref 3.87–5.11)
RDW: 13.6 % (ref 11.5–15.5)
WBC: 14.3 10*3/uL — ABNORMAL HIGH (ref 4.0–10.5)
nRBC: 0 % (ref 0.0–0.2)

## 2018-08-18 LAB — BASIC METABOLIC PANEL
Anion gap: 7 (ref 5–15)
BUN: <5 mg/dL — ABNORMAL LOW (ref 6–20)
CO2: 26 mmol/L (ref 22–32)
Calcium: 9.2 mg/dL (ref 8.9–10.3)
Chloride: 109 mmol/L (ref 98–111)
Creatinine, Ser: 0.79 mg/dL (ref 0.44–1.00)
GFR calc Af Amer: >60 mL/min (ref >60)
GFR calc non Af Amer: >60 mL/min (ref >60)
Glucose, Bld: 99 mg/dL (ref 70–99)
POTASSIUM: 3.7 mmol/L (ref 3.5–5.1)
Sodium: 142 mmol/L (ref 135–145)

## 2018-08-18 LAB — URINE CULTURE: Culture: 50000 — AB

## 2018-08-18 MED ORDER — CIPROFLOXACIN HCL 500 MG PO TABS: 500.0000 mg | ORAL_TABLET | Freq: Two times a day (BID) | ORAL | 0 refills | Status: AC

## 2018-08-18 NOTE — Plan of Care (Signed)

## 2018-08-18 NOTE — Discharge Summary (Signed)
Physician Discharge Summary  Monica Hopkins ZOX:[id number] DOB: 05/09/1972Alger Hopkins DOA: 08/16/2018  PCP: Monica Hopkins, [name], MD  Admit date: 08/16/2018 Discharge date: 08/18/2018  Admitted From: Home Disposition:  Home  Discharge Condition:Stable CODE STATUS:FULL Diet recommendation: Regular  Brief/Interim Summary:  Patient is a [age] old female with past medical history of bipolar disorder, chronic pain syndrome, followed by pain clinic on buprenorphine patch, anxiety, depression who presented with dysuria, increased urinary frequency, upper respiratory symptoms, poor oral intake.  She failed outpatient antibiotic therapy for UTI.  Presented with nausea to the emergency department.  She was admitted for urinary tract infection management. Urine culture revealed E. Coli.  Currently she is hemodynamically stable and able to take antibiotics by mouth.  She will be discharged home today.  Following problems were addressed during her hospitalization:  Suspected sepsis: Sepsis ruled out. Blood cultures negative so far.   She is hemodynamically stable at present.  Urinary tract infection: Presented with symptoms of dysuria, increased frequency.  Failed antibiotic as an outpatient.  Urinalysis was grossly abnormal.  Urine culture has been sent and showed E. coli.  Antibiotics changed to ciprofloxacin.  Severe hypokalemia: Supplemented and corrected.  Acute kidney injury: Resolved with IV fluids  Nausea/vomiting/diarrhea: Resolved  Bipolar disorder: On lithium at home.  Chronic pain syndrome: Follows at pain clinic.  On oxycodone and buprenorphine patch    Discharge Diagnoses:  Principal Problem:   Sepsis (HCC) Active Problems:   Bipolar 1 disorder [facility](HCC)    Discharge Instructions  Discharge Instructions    Diet - low sodium heart healthy   Complete by:  As directed    Discharge instructions   Complete by:  As directed    1) Follow up with your PCP in a week. 2) Take prescribed  medications as instructed.   Increase activity slowly   Complete by:  As directed      Allergies as of 08/18/2018      Reactions   Abilify [aripiprazole] Swelling, Rash   Lamictal [lamotrigine] Swelling, Rash   Latuda [lurasidone] Other (See Comments)   Caused leg weakness      Medication List    STOP taking these medications   metroNIDAZOLE 500 MG tablet Commonly known as:  FLAGYL   sulfamethoxazole-trimethoprim 800-160 MG tablet Commonly known as:  BACTRIM DS,SEPTRA DS     TAKE these medications   buprenorphine 20 MCG/HR Ptwk patch Commonly known as:  BUTRANS - dosed mcg/hr Place 20 mcg onto the skin every Friday.   ciprofloxacin 500 MG tablet Commonly known as:  CIPRO Take 1 tablet (500 mg total) by mouth 2 (two) times daily for 3 days. Start taking on:  August 19, 2018   clonazePAM 0.5 MG tablet Commonly known as:  KLONOPIN Take 1 tablet (0.5 mg total) by mouth 2 (two) times daily.   gabapentin 800 MG tablet Commonly known as:  NEURONTIN Take 800 mg by mouth 3 (three) times daily.   ibuprofen 200 MG tablet Commonly known as:  ADVIL,MOTRIN Take 800 mg by mouth 2 (two) times daily as needed (for pain).   lithium carbonate 450 MG CR tablet Commonly known as:  ESKALITH Take 1 tablet (450 mg total) by mouth 2 (two) times daily.   loratadine 10 MG tablet Commonly known as:  CLARITIN Take 10 mg by mouth daily as needed for allergies or rhinitis.   MUCINEX FAST-MAX PO Take 15 mLs by mouth every 6 (six) hours as needed (for symptoms/congestion).   ondansetron 8 MG tablet Commonly  known as:  ZOFRAN Take 8 mg by mouth every 8 (eight) hours as needed for nausea or vomiting.   oxyCODONE 15 MG immediate release tablet Commonly known as:  ROXICODONE Take 15 mg by mouth every 6 (six) hours as needed. for pain   PROVENTIL HFA 108 (90 Base) MCG/ACT inhaler Generic drug:  albuterol Inhale 1-2 puffs into the lungs every 6 (six) hours as needed for wheezing or  shortness of breath.   tiZANidine 4 MG tablet Commonly known as:  ZANAFLEX Take 4 mg by mouth 3 (three) times daily.   traZODone 100 MG tablet Commonly known as:  DESYREL TAKE 1 TABLET BY MOUTH AT BEDTIME What changed:    how much to take  how to take this  when to take this  additional instructions      Follow-up Information    Monica Hopkins, [name], MD. Schedule an appointment as soon as possible for a visit in 1 week(s).   Specialty:  Family Medicine Contact information: [address] [place] [address] [phone]          Allergies  Allergen Reactions  . Abilify [Aripiprazole] Swelling and Rash  . Lamictal [Lamotrigine] Swelling and Rash  . Latuda [Lurasidone] Other (See Comments)    Caused leg weakness    Consultations:  None   Procedures/Studies: Dg Chest 2 View  Result Date: 08/16/2018 CLINICAL DATA:  Nausea, vomiting and dizziness for 2 days. EXAM: CHEST - 2 VIEW COMPARISON:  CT chest, abdomen and pelvis 04/28/2014. PA and lateral chest 02/10/2014. FINDINGS: And lungs are clear. Heart size is normal. No pneumothorax or pleural fluid. Lower thoracic and upper lumbar spondylosis noted. No acute bony abnormality. IMPRESSION: No acute disease. Electronically Signed   By: Drusilla Kannerhomas  Dalessio M.D.   On: 08/16/2018 14:29       Subjective: Patient seen and examined the bedside this morning.  Remains comfortable.  Afebrile.  Hemodynamically stable.  Stable for discharge to home today.  Discharge Exam: Vitals:   08/18/18 0510 08/18/18 0700  BP: 93/60 99/61  Pulse: 79 68  Resp: 19 18  Temp: 98.1 F (36.7 C) 98 F (36.7 C)  SpO2: 94% 94%   Vitals:   08/17/18 1931 08/17/18 2314 08/18/18 0510 08/18/18 0700  BP: 96/61 102/70 93/60 99/61   Pulse: 67 75 79 68  Resp: 20 18 19 18   Temp: 98.1 F (36.7 C) 98.5 F (36.9 C) 98.1 F (36.7 C) 98 F (36.7 C)  TempSrc: Oral Oral Oral Oral  SpO2: 98% 95% 94% 94%  Weight:      Height:         General: Pt is alert, awake, not in acute distress Cardiovascular: RRR, S1/S2 +, no rubs, no gallops Respiratory: CTA bilaterally, no wheezing, no rhonchi Abdominal: Soft, NT, ND, bowel sounds + Extremities: no edema, no cyanosis    The results of significant diagnostics from this hospitalization (including imaging, microbiology, ancillary and laboratory) are listed below for reference.     Microbiology: Recent Results (from the past 240 hour(s))  Urine C&S     Status: Abnormal   Collection Time: 08/16/18  3:00 PM  Result Value Ref Range Status   Specimen Description URINE, CLEAN CATCH  Final   Special Requests   Final    NONE Performed at [facility], [address] [place], [place] [address]    Culture 50,000 COLONIES/mL ESCHERICHIA COLI (A)  Final   Report Status 08/18/2018 FINAL  Final   Organism ID,  Bacteria ESCHERICHIA COLI (A)  Final      Susceptibility   Escherichia coli - MIC*    AMPICILLIN >=32 RESISTANT Resistant     CEFAZOLIN 8 SENSITIVE Sensitive     CEFTRIAXONE <=1 SENSITIVE Sensitive     CIPROFLOXACIN <=0.25 SENSITIVE Sensitive     GENTAMICIN <=1 SENSITIVE Sensitive     IMIPENEM <=0.25 SENSITIVE Sensitive     NITROFURANTOIN <=16 SENSITIVE Sensitive     TRIMETH/SULFA >=320 RESISTANT Resistant     AMPICILLIN/SULBACTAM >=32 RESISTANT Resistant     PIP/TAZO <=4 SENSITIVE Sensitive     Extended ESBL NEGATIVE Sensitive     * 50,000 COLONIES/mL ESCHERICHIA COLI  Blood culture (routine x 2)     Status: None (Preliminary result)   Collection Time: 08/16/18  3:37 PM  Result Value Ref Range Status   Specimen Description BLOOD LEFT HAND  Final   Special Requests   Final    BOTTLES DRAWN AEROBIC ONLY Blood Culture results may not be optimal due to an inadequate volume of blood received in culture bottles   Culture   Final    NO GROWTH 2 DAYS Performed at [facility], [address], [place], [place] [address]    Report Status PENDING  Incomplete   Blood culture (routine x 2)     Status: None (Preliminary result)   Collection Time: 08/16/18  4:00 PM  Result Value Ref Range Status   Specimen Description BLOOD RIGHT HAND  Final   Special Requests   Final    BOTTLES DRAWN AEROBIC AND ANAEROBIC Blood Culture results may not be optimal due to an inadequate volume of blood received in culture bottles   Culture   Final    NO GROWTH 2 DAYS Performed at [facility], [address] [place], [place] [address]    Report Status PENDING  Incomplete     Labs: BNP (last 3 results) No results for input(s): BNP in the last 8760 hours. Basic Metabolic Panel: Recent Labs  Lab 08/16/18 1345 08/16/18 2021 08/17/18 0442 08/17/18 0855 08/17/18 1356 08/18/18 0551  NA 136  --  140  --   --  142  K 2.9*  --  2.8*  --  3.5 3.7  CL 92*  --  104  --   --  109  CO2 29  --  29  --   --  26  GLUCOSE 131*  --  109*  --   --  99  BUN 24*  --  11  --   --  <5*  CREATININE 1.33* 0.98 0.77  --   --  0.79  CALCIUM 10.2  --  8.9  --   --  9.2  MG  --   --   --  1.7  --   --    Liver Function Tests: Recent Labs  Lab 08/16/18 1345 08/17/18 0442  AST 33 22  ALT 42 31  ALKPHOS 107 74  BILITOT 1.4* 0.7  PROT 6.6 4.8*  ALBUMIN 3.8 2.7*   Recent Labs  Lab 08/16/18 1345  LIPASE 41   No results for input(s): AMMONIA in the last 168 hours. CBC: Recent Labs  Lab 08/16/18 1345 08/16/18 2021 08/17/18 0442 08/18/18 0551  WBC 29.1* 24.0* 17.2* 14.3*  NEUTROABS  --   --   --  8.0*  HGB 16.1* 14.5 12.8 13.9  HCT 49.5* 45.3 39.9 45.3  MCV 91.0 93.0 93.4 94.6  PLT 356 305 260  257   Cardiac Enzymes: No results for input(s): CKTOTAL, CKMB, CKMBINDEX, TROPONINI in the last 168 hours. BNP: Invalid input(s): POCBNP CBG: No results for input(s): GLUCAP in the last 168 hours. D-Dimer No results for input(s): DDIMER in the last 72 hours. Hgb A1c No results for input(s): HGBA1C in the last 72 hours. Lipid Profile No results for input(s):  CHOL, HDL, LDLCALC, TRIG, CHOLHDL, LDLDIRECT in the last 72 hours. Thyroid function studies No results for input(s): TSH, T4TOTAL, T3FREE, THYROIDAB in the last 72 hours.  Invalid input(s): FREET3 Anemia work up No results for input(s): VITAMINB12, FOLATE, FERRITIN, TIBC, IRON, RETICCTPCT in the last 72 hours. Urinalysis    Component Value Date/Time   COLORURINE AMBER (A) 08/16/2018 1345   APPEARANCEUR CLEAR 08/16/2018 1345   LABSPEC 1.011 08/16/2018 1345   PHURINE 6.0 08/16/2018 1345   GLUCOSEU NEGATIVE 08/16/2018 1345   HGBUR NEGATIVE 08/16/2018 1345   BILIRUBINUR NEGATIVE 08/16/2018 1345   KETONESUR 5 (A) 08/16/2018 1345   PROTEINUR 30 (A) 08/16/2018 1345   UROBILINOGEN 1.0 01/25/2014 2305   NITRITE POSITIVE (A) 08/16/2018 1345   LEUKOCYTESUR TRACE (A) 08/16/2018 1345   Sepsis Labs Invalid input(s): PROCALCITONIN,  WBC,  LACTICIDVEN Microbiology Recent Results (from the past 240 hour(s))  Urine C&S     Status: Abnormal   Collection Time: 08/16/18  3:00 PM  Result Value Ref Range Status   Specimen Description URINE, CLEAN CATCH  Final   Special Requests   Final    NONE Performed at [facility], [address], [place], [place] [address]    Culture 50,000 COLONIES/mL ESCHERICHIA COLI (A)  Final   Report Status 08/18/2018 FINAL  Final   Organism ID, Bacteria ESCHERICHIA COLI (A)  Final      Susceptibility   Escherichia coli - MIC*    AMPICILLIN >=32 RESISTANT Resistant     CEFAZOLIN 8 SENSITIVE Sensitive     CEFTRIAXONE <=1 SENSITIVE Sensitive     CIPROFLOXACIN <=0.25 SENSITIVE Sensitive     GENTAMICIN <=1 SENSITIVE Sensitive     IMIPENEM <=0.25 SENSITIVE Sensitive     NITROFURANTOIN <=16 SENSITIVE Sensitive     TRIMETH/SULFA >=320 RESISTANT Resistant     AMPICILLIN/SULBACTAM >=32 RESISTANT Resistant     PIP/TAZO <=4 SENSITIVE Sensitive     Extended ESBL NEGATIVE Sensitive     * 50,000 COLONIES/mL ESCHERICHIA COLI  Blood culture (routine x 2)     Status:  None (Preliminary result)   Collection Time: 08/16/18  3:37 PM  Result Value Ref Range Status   Specimen Description BLOOD LEFT HAND  Final   Special Requests   Final    BOTTLES DRAWN AEROBIC ONLY Blood Culture results may not be optimal due to an inadequate volume of blood received in culture bottles   Culture   Final    NO GROWTH 2 DAYS Performed at [facility], [address], [place], [place] [address]    Report Status PENDING  Incomplete  Blood culture (routine x 2)     Status: None (Preliminary result)   Collection Time: 08/16/18  4:00 PM  Result Value Ref Range Status   Specimen Description BLOOD RIGHT HAND  Final   Special Requests   Final    BOTTLES DRAWN AEROBIC AND ANAEROBIC Blood Culture results may not be optimal due to an inadequate volume of blood received in culture bottles   Culture   Final    NO GROWTH 2 DAYS Performed at [facility], [address], [place], [place] [address]    Report Status PENDING  Incomplete    Please note: You were cared for by a hospitalist during your hospital stay. Once you are discharged, your primary care physician will handle any further medical issues. Please note that NO REFILLS for any discharge medications will be authorized once you are discharged, as it is imperative that you return to your primary care physician (or establish a relationship with a primary care physician if you do not have one) for your post hospital discharge needs so that they can reassess your need for medications and monitor your lab values.    Time coordinating discharge: 40 minutes  SIGNED:   Burnadette Pop, MD  [facility] 08/18/2018, 11:28 AM Pager [phone]  If 7PM-7AM, please contact night-coverage [email or web address] Password [email or web address]

## 2018-08-18 NOTE — Care Management Note (Signed)
Case Management Note  Patient Details  Name: Monica Hopkins MRN: [id number] Date of Birth: 1971-05-23  Subjective/Objective:     Pt in with sepsis. She is from home with her    PCP: Dr Cliffton Asters           Action/Plan: Pt discharging home with self care. Pt has hospital f/u, insurance and transportation home.   Expected Discharge Date:  08/18/18               Expected Discharge Plan:  Home/Self Care  In-House Referral:     Discharge planning Services     Post Acute Care Choice:    Choice offered to:     DME Arranged:    DME Agency:     HH Arranged:    HH Agency:     Status of Service:  Completed, signed off  If discussed at [employer] of Stay Meetings, dates discussed:    Additional Comments:  Kermit Balo, RN 08/18/2018, 12:03 PM

## 2018-08-18 NOTE — Plan of Care (Signed)
Care plan goals met, no new concerns. Pt is d/c home. Alert and oriented, RM air. D/C instructions  done with teach back. Pt verbalize understanding. Pt will be transported out of facility by family

## 2018-08-21 LAB — CULTURE, BLOOD (ROUTINE X 2)
Culture: NO GROWTH
Culture: NO GROWTH

## 2018-08-25 DIAGNOSIS — N390 Urinary tract infection, site not specified: Secondary | ICD-10-CM

## 2018-08-25 DIAGNOSIS — N179 Acute kidney failure, unspecified: Secondary | ICD-10-CM

## 2018-08-25 DIAGNOSIS — R319 Hematuria, unspecified: Secondary | ICD-10-CM

## 2018-08-29 ENCOUNTER — Other Ambulatory Visit (HOSPITAL_COMMUNITY): Payer: Self-pay | Admitting: Psychiatry

## 2018-08-29 DIAGNOSIS — F411 Generalized anxiety disorder: Secondary | ICD-10-CM

## 2018-08-30 ENCOUNTER — Ambulatory Visit (INDEPENDENT_AMBULATORY_CARE_PROVIDER_SITE_OTHER): Payer: BLUE CROSS/BLUE SHIELD | Admitting: Psychiatry

## 2018-08-30 DIAGNOSIS — F411 Generalized anxiety disorder: Secondary | ICD-10-CM | POA: Diagnosis not present

## 2018-08-30 DIAGNOSIS — F3131 Bipolar disorder, current episode depressed, mild: Secondary | ICD-10-CM

## 2018-08-30 MED ORDER — LITHIUM CARBONATE ER 300 MG PO TBCR: 300.0000 mg | EXTENDED_RELEASE_TABLET | Freq: Two times a day (BID) | ORAL | 2 refills | Status: DC

## 2018-08-30 MED ORDER — TRAZODONE HCL 100 MG PO TABS: ORAL_TABLET | ORAL | 0 refills | Status: DC

## 2018-08-30 MED ORDER — CLONAZEPAM 0.5 MG PO TABS: 0.5000 mg | ORAL_TABLET | Freq: Two times a day (BID) | ORAL | 2 refills | Status: DC
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
## 2018-11-21 ENCOUNTER — Other Ambulatory Visit (HOSPITAL_COMMUNITY): Payer: Self-pay

## 2018-11-21 DIAGNOSIS — F3131 Bipolar disorder, current episode depressed, mild: Secondary | ICD-10-CM

## 2018-11-21 MED ORDER — LITHIUM CARBONATE ER 300 MG PO TBCR: 300.0000 mg | EXTENDED_RELEASE_TABLET | Freq: Two times a day (BID) | ORAL | 0 refills | Status: DC

## 2018-11-29 ENCOUNTER — Other Ambulatory Visit (HOSPITAL_COMMUNITY): Payer: Self-pay

## 2018-11-29 ENCOUNTER — Ambulatory Visit (HOSPITAL_COMMUNITY): Payer: BLUE CROSS/BLUE SHIELD | Admitting: Psychiatry

## 2018-11-29 DIAGNOSIS — F411 Generalized anxiety disorder: Secondary | ICD-10-CM

## 2018-11-29 DIAGNOSIS — F3131 Bipolar disorder, current episode depressed, mild: Secondary | ICD-10-CM

## 2018-11-29 MED ORDER — TRAZODONE HCL 100 MG PO TABS: ORAL_TABLET | ORAL | 0 refills | Status: DC

## 2018-11-29 MED ORDER — CLONAZEPAM 0.5 MG PO TABS: 0.5000 mg | ORAL_TABLET | Freq: Two times a day (BID) | ORAL | 0 refills | Status: DC

## 2018-12-06 ENCOUNTER — Other Ambulatory Visit (HOSPITAL_COMMUNITY): Payer: Self-pay | Admitting: Psychiatry

## 2018-12-06 DIAGNOSIS — F3131 Bipolar disorder, current episode depressed, mild: Secondary | ICD-10-CM

## 2018-12-08 ENCOUNTER — Other Ambulatory Visit: Payer: Self-pay

## 2018-12-08 ENCOUNTER — Ambulatory Visit (INDEPENDENT_AMBULATORY_CARE_PROVIDER_SITE_OTHER): Payer: BLUE CROSS/BLUE SHIELD | Admitting: Psychiatry

## 2018-12-08 ENCOUNTER — Encounter (HOSPITAL_COMMUNITY): Payer: Self-pay | Admitting: Psychiatry

## 2018-12-08 DIAGNOSIS — F411 Generalized anxiety disorder: Secondary | ICD-10-CM

## 2018-12-08 DIAGNOSIS — F3131 Bipolar disorder, current episode depressed, mild: Secondary | ICD-10-CM

## 2018-12-08 MED ORDER — LITHIUM CARBONATE ER 300 MG PO TBCR: 300.0000 mg | EXTENDED_RELEASE_TABLET | Freq: Two times a day (BID) | ORAL | 2 refills | Status: DC

## 2018-12-08 MED ORDER — TRAZODONE HCL 100 MG PO TABS: ORAL_TABLET | ORAL | 2 refills | Status: DC

## 2018-12-08 MED ORDER — CLONAZEPAM 0.5 MG PO TABS: 0.5000 mg | ORAL_TABLET | Freq: Two times a day (BID) | ORAL | 2 refills | Status: DC
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
## 2019-01-18 IMAGING — CR DG CHEST 2V
2 series · 2 of 2 positions shown · non-contrast
Comparison: CT chest, abdomen and pelvis 04/28/2014. PA and lateral
chest 02/10/2014.

CLINICAL DATA: Nausea, vomiting and dizziness for 2 days.

EXAM:
CHEST - 2 VIEW

[chest ap]
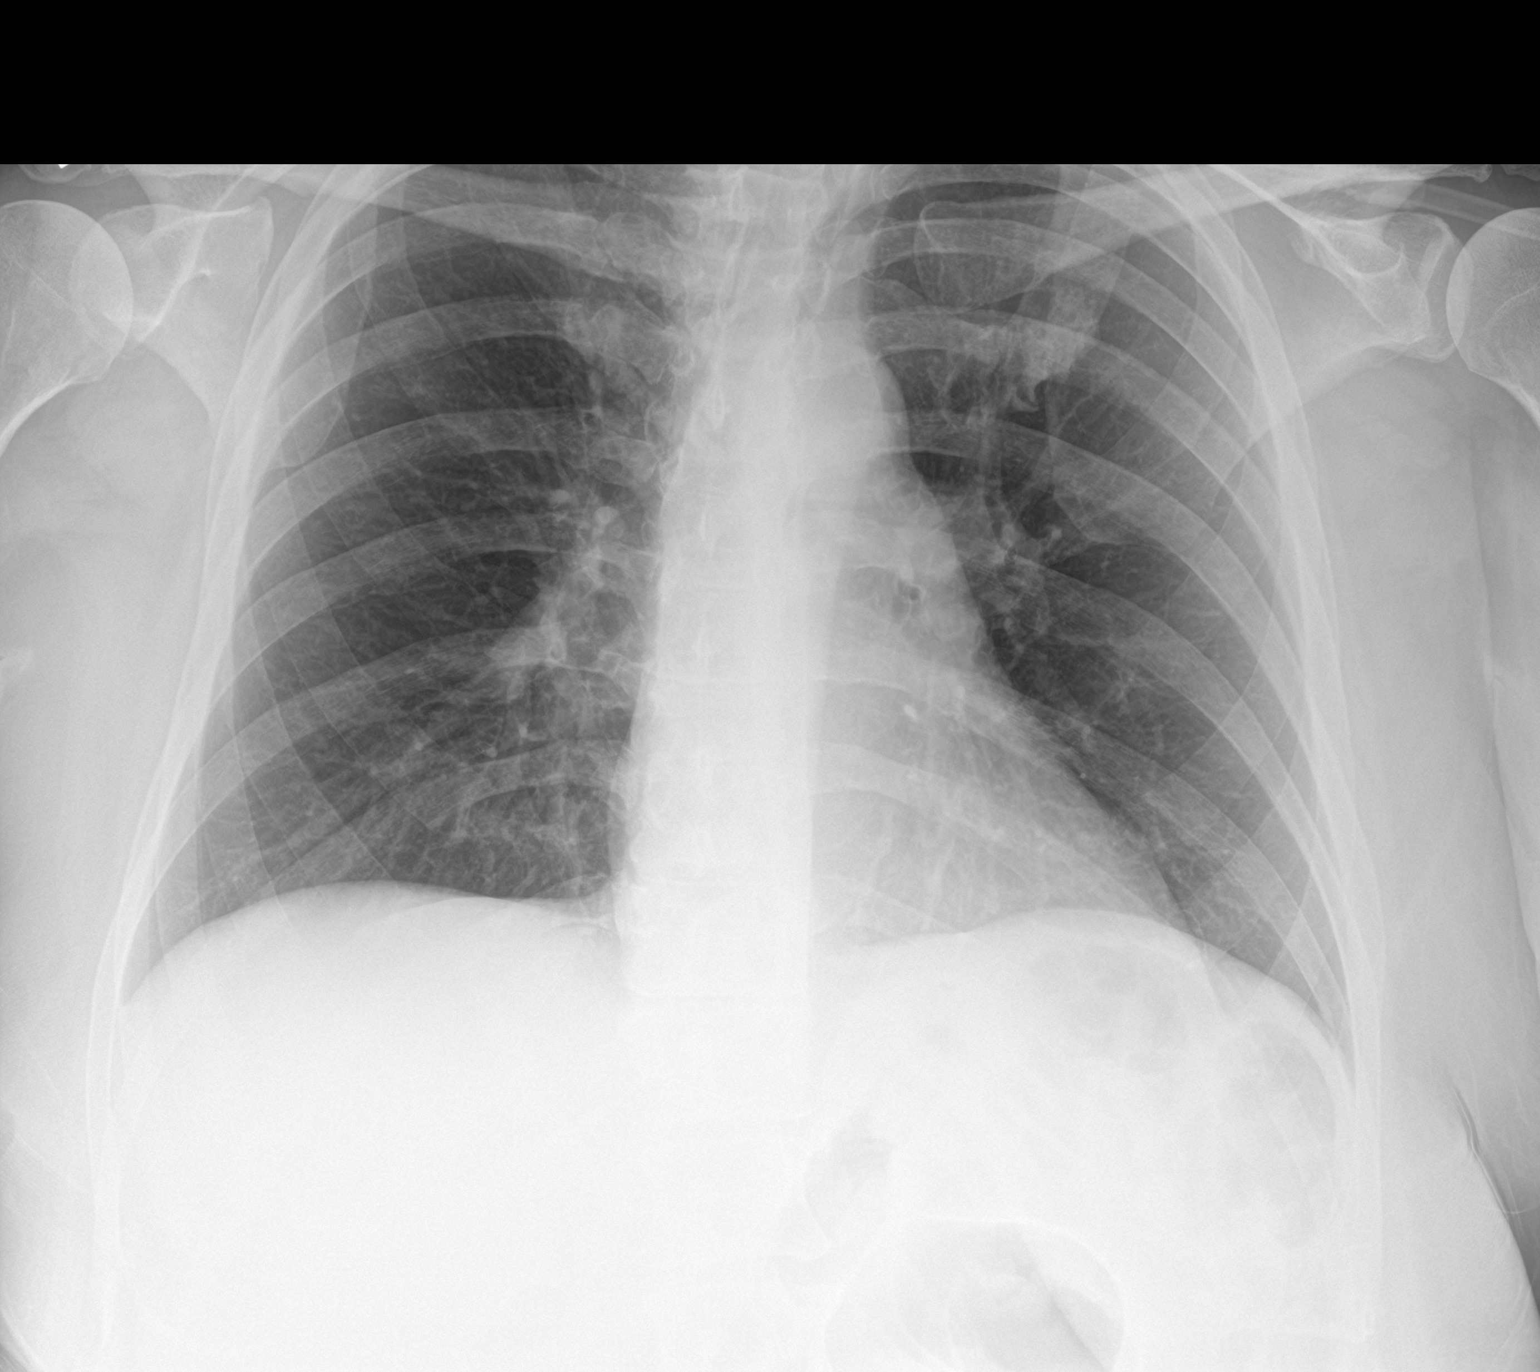

[chest lat]
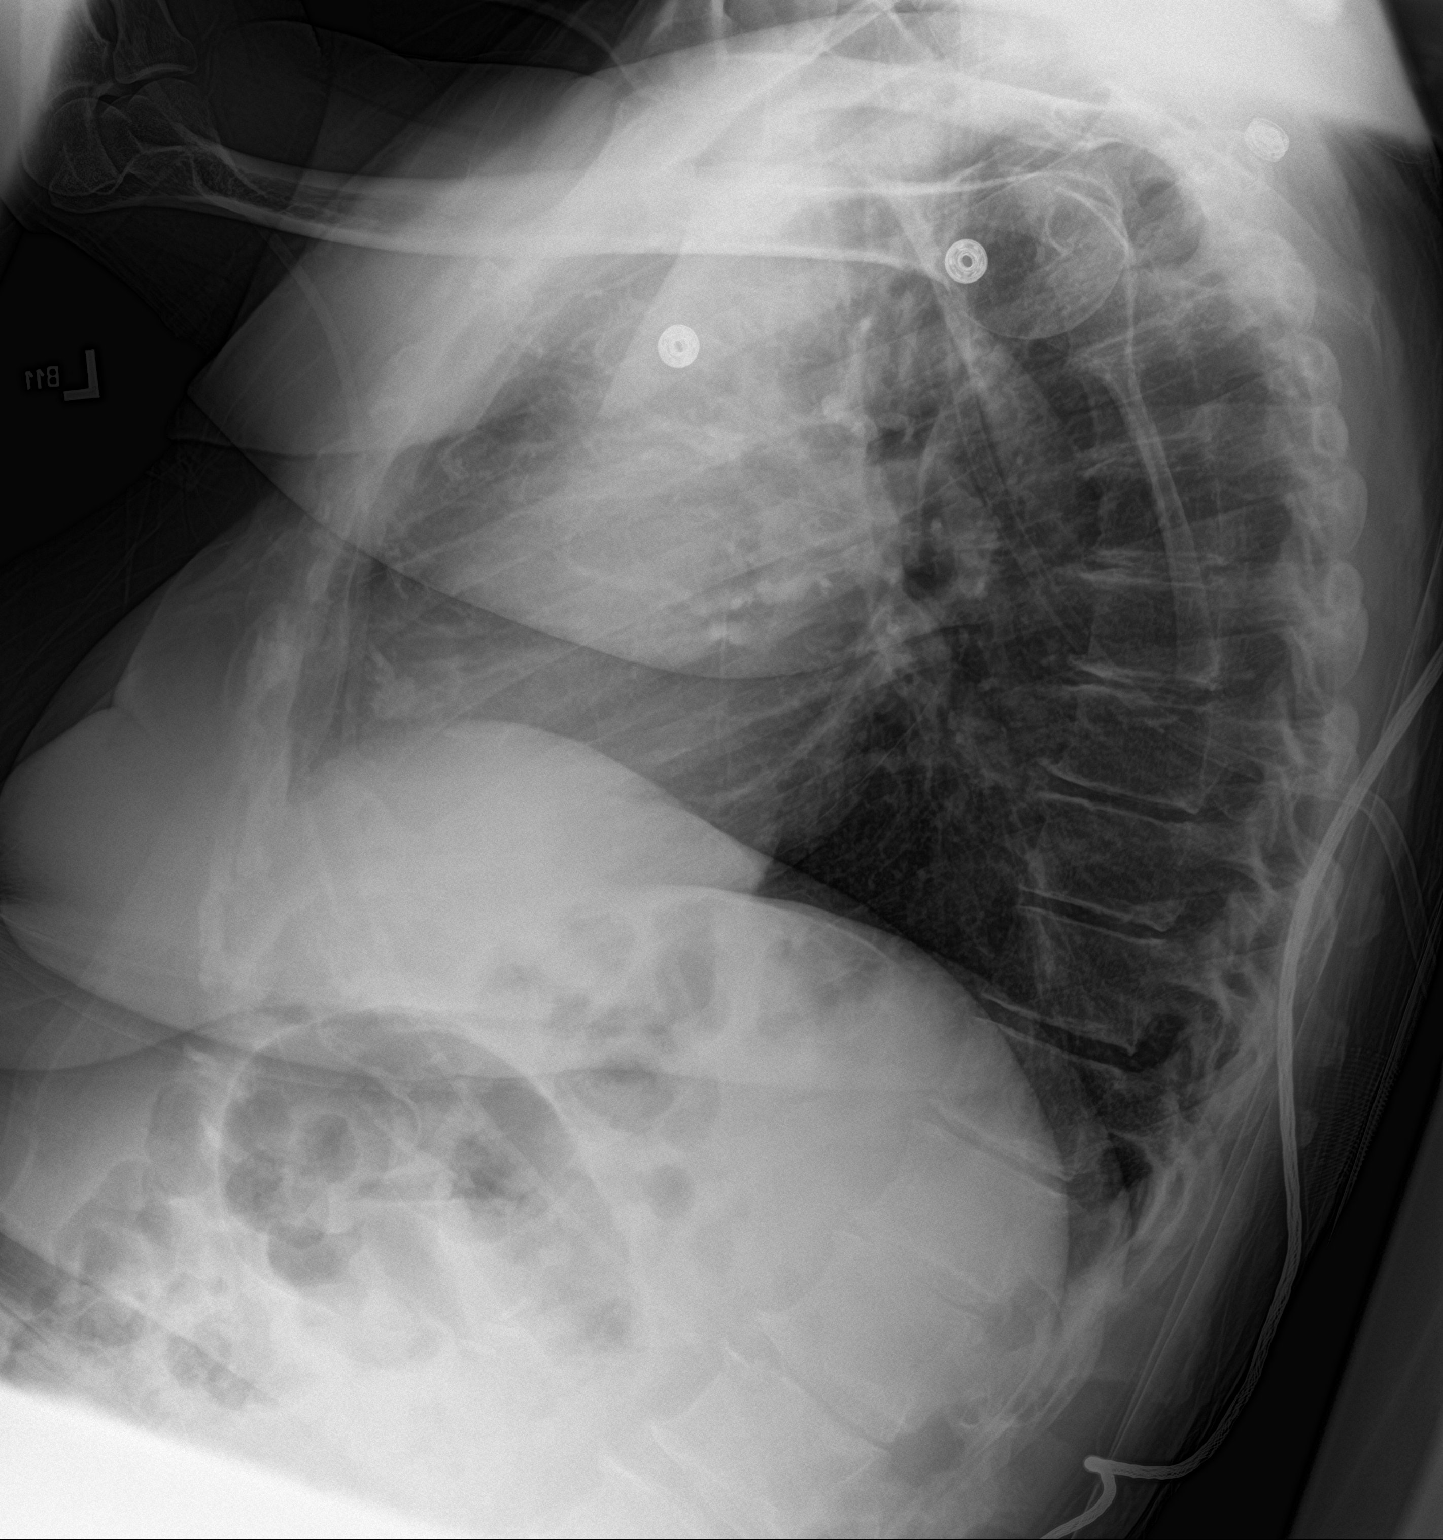

[2 of 2 positions shown; findings below may reference images not displayed]

FINDINGS: And lungs are clear. Heart size is normal. No pneumothorax or
pleural fluid. Lower thoracic and upper lumbar spondylosis noted. No
acute bony abnormality.
IMPRESSION: No acute disease.

## 2019-01-19 ENCOUNTER — Other Ambulatory Visit (HOSPITAL_COMMUNITY): Payer: Self-pay | Admitting: Psychiatry

## 2019-01-19 DIAGNOSIS — F3131 Bipolar disorder, current episode depressed, mild: Secondary | ICD-10-CM

## 2019-03-09 ENCOUNTER — Ambulatory Visit (INDEPENDENT_AMBULATORY_CARE_PROVIDER_SITE_OTHER): Payer: BLUE CROSS/BLUE SHIELD | Admitting: Psychiatry

## 2019-03-09 ENCOUNTER — Other Ambulatory Visit: Payer: Self-pay

## 2019-03-09 ENCOUNTER — Encounter (HOSPITAL_COMMUNITY): Payer: Self-pay | Admitting: Psychiatry

## 2019-03-09 DIAGNOSIS — F3131 Bipolar disorder, current episode depressed, mild: Secondary | ICD-10-CM

## 2019-03-09 DIAGNOSIS — F411 Generalized anxiety disorder: Secondary | ICD-10-CM | POA: Diagnosis not present

## 2019-03-09 DIAGNOSIS — Z79899 Other long term (current) drug therapy: Secondary | ICD-10-CM | POA: Diagnosis not present

## 2019-03-09 MED ORDER — LITHIUM CARBONATE ER 300 MG PO TBCR: 300.0000 mg | EXTENDED_RELEASE_TABLET | Freq: Two times a day (BID) | ORAL | 2 refills | Status: DC

## 2019-03-09 MED ORDER — CLONAZEPAM 0.5 MG PO TABS: 0.5000 mg | ORAL_TABLET | Freq: Two times a day (BID) | ORAL | 2 refills | Status: DC

## 2019-03-09 MED ORDER — TRAZODONE HCL 100 MG PO TABS: ORAL_TABLET | ORAL | 2 refills | Status: DC
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
## 2019-06-07 ENCOUNTER — Ambulatory Visit (INDEPENDENT_AMBULATORY_CARE_PROVIDER_SITE_OTHER): Payer: BLUE CROSS/BLUE SHIELD | Admitting: Psychiatry

## 2019-06-07 ENCOUNTER — Encounter (HOSPITAL_COMMUNITY): Payer: Self-pay | Admitting: Psychiatry

## 2019-06-07 ENCOUNTER — Other Ambulatory Visit: Payer: Self-pay

## 2019-06-07 DIAGNOSIS — F411 Generalized anxiety disorder: Secondary | ICD-10-CM

## 2019-06-07 DIAGNOSIS — F3131 Bipolar disorder, current episode depressed, mild: Secondary | ICD-10-CM | POA: Diagnosis not present

## 2019-06-07 MED ORDER — CLONAZEPAM 0.5 MG PO TABS: 0.5000 mg | ORAL_TABLET | Freq: Two times a day (BID) | ORAL | 2 refills | Status: DC

## 2019-06-07 MED ORDER — TRAZODONE HCL 100 MG PO TABS: ORAL_TABLET | ORAL | 2 refills | Status: DC

## 2019-06-07 MED ORDER — LITHIUM CARBONATE ER 300 MG PO TBCR: 300.0000 mg | EXTENDED_RELEASE_TABLET | Freq: Two times a day (BID) | ORAL | 2 refills | Status: DC
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
## 2019-08-03 ENCOUNTER — Telehealth (HOSPITAL_COMMUNITY): Payer: Self-pay | Admitting: *Deleted

## 2019-08-03 ENCOUNTER — Other Ambulatory Visit (HOSPITAL_COMMUNITY): Payer: Self-pay | Admitting: *Deleted

## 2019-08-03 DIAGNOSIS — F3131 Bipolar disorder, current episode depressed, mild: Secondary | ICD-10-CM

## 2019-08-03 MED ORDER — LITHIUM CARBONATE ER 300 MG PO TBCR: 300.0000 mg | EXTENDED_RELEASE_TABLET | Freq: Two times a day (BID) | ORAL | 0 refills | Status: DC

## 2019-08-03 NOTE — Telephone Encounter (Signed)
Received message from pt regarding refill on the Lithium 300MG  CR bid that was ordered on 11/14//20 with 2 refills but only is for 15 days. 30 tabs each RX. Pt will run out before appointment on 09/07/19     lithium carbonate (LITHOBID) 300 MG CR tablet(Expired) 300 mg, 2 times daily 2 ordered        Summary: Take 1 tablet (300 mg total) by mouth 2 (two) times daily for 15 days., Starting Wed 06/07/2019, Until Thu 06/22/2019, Normal     Please review.

## 2019-08-25 ENCOUNTER — Other Ambulatory Visit (HOSPITAL_COMMUNITY): Payer: Self-pay | Admitting: Psychiatry

## 2019-08-25 DIAGNOSIS — F3131 Bipolar disorder, current episode depressed, mild: Secondary | ICD-10-CM

## 2019-08-31 ENCOUNTER — Other Ambulatory Visit (HOSPITAL_COMMUNITY): Payer: Self-pay | Admitting: Psychiatry

## 2019-08-31 DIAGNOSIS — F3131 Bipolar disorder, current episode depressed, mild: Secondary | ICD-10-CM

## 2019-09-04 ENCOUNTER — Other Ambulatory Visit (HOSPITAL_COMMUNITY): Payer: Self-pay | Admitting: *Deleted

## 2019-09-04 DIAGNOSIS — F3131 Bipolar disorder, current episode depressed, mild: Secondary | ICD-10-CM

## 2019-09-04 MED ORDER — LITHIUM CARBONATE ER 300 MG PO TBCR: 300.0000 mg | EXTENDED_RELEASE_TABLET | Freq: Two times a day (BID) | ORAL | 0 refills | Status: DC

## 2019-09-07 ENCOUNTER — Other Ambulatory Visit: Payer: Self-pay

## 2019-09-07 ENCOUNTER — Telehealth (HOSPITAL_COMMUNITY): Payer: Self-pay | Admitting: *Deleted

## 2019-09-07 ENCOUNTER — Encounter (HOSPITAL_COMMUNITY): Payer: Self-pay | Admitting: Psychiatry

## 2019-09-07 ENCOUNTER — Ambulatory Visit (INDEPENDENT_AMBULATORY_CARE_PROVIDER_SITE_OTHER): Payer: BLUE CROSS/BLUE SHIELD | Admitting: Psychiatry

## 2019-09-07 DIAGNOSIS — Z79899 Other long term (current) drug therapy: Secondary | ICD-10-CM

## 2019-09-07 DIAGNOSIS — F411 Generalized anxiety disorder: Secondary | ICD-10-CM | POA: Diagnosis not present

## 2019-09-07 DIAGNOSIS — F3131 Bipolar disorder, current episode depressed, mild: Secondary | ICD-10-CM | POA: Diagnosis not present

## 2019-09-07 MED ORDER — TRAZODONE HCL 100 MG PO TABS: ORAL_TABLET | ORAL | 2 refills | Status: DC

## 2019-09-07 MED ORDER — LITHIUM CARBONATE ER 300 MG PO TBCR: 300.0000 mg | EXTENDED_RELEASE_TABLET | Freq: Two times a day (BID) | ORAL | 0 refills | Status: DC

## 2019-09-07 MED ORDER — CLONAZEPAM 0.5 MG PO TABS: 0.5000 mg | ORAL_TABLET | Freq: Two times a day (BID) | ORAL | 2 refills | Status: DC

## 2019-09-07 NOTE — Telephone Encounter (Signed)
Writer spoke with pt to inform her that lab orders have been sent to [facility] on [address] [place]. Pt agreed to that location and Clinical research associate gave her the address. Pt verbalizes understanding.
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
## 2019-10-22 ENCOUNTER — Other Ambulatory Visit (HOSPITAL_COMMUNITY): Payer: Self-pay | Admitting: Psychiatry

## 2019-10-22 DIAGNOSIS — F3131 Bipolar disorder, current episode depressed, mild: Secondary | ICD-10-CM

## 2019-11-07 ENCOUNTER — Other Ambulatory Visit (HOSPITAL_COMMUNITY): Payer: Self-pay | Admitting: Psychiatry

## 2019-11-07 DIAGNOSIS — F3131 Bipolar disorder, current episode depressed, mild: Secondary | ICD-10-CM

## 2019-11-08 ENCOUNTER — Other Ambulatory Visit (HOSPITAL_COMMUNITY): Payer: Self-pay | Admitting: *Deleted

## 2019-11-08 DIAGNOSIS — F3131 Bipolar disorder, current episode depressed, mild: Secondary | ICD-10-CM

## 2019-11-08 MED ORDER — LITHIUM CARBONATE ER 300 MG PO TBCR: 300.0000 mg | EXTENDED_RELEASE_TABLET | Freq: Two times a day (BID) | ORAL | 0 refills | Status: DC

## 2019-11-30 ENCOUNTER — Other Ambulatory Visit (HOSPITAL_COMMUNITY): Payer: Self-pay | Admitting: Psychiatry

## 2019-11-30 DIAGNOSIS — F3131 Bipolar disorder, current episode depressed, mild: Secondary | ICD-10-CM

## 2019-12-03 ENCOUNTER — Other Ambulatory Visit (HOSPITAL_COMMUNITY): Payer: Self-pay | Admitting: Psychiatry

## 2019-12-03 DIAGNOSIS — F3131 Bipolar disorder, current episode depressed, mild: Secondary | ICD-10-CM

## 2019-12-06 ENCOUNTER — Other Ambulatory Visit: Payer: Self-pay

## 2019-12-06 ENCOUNTER — Telehealth (INDEPENDENT_AMBULATORY_CARE_PROVIDER_SITE_OTHER): Payer: BLUE CROSS/BLUE SHIELD | Admitting: Psychiatry

## 2019-12-06 ENCOUNTER — Encounter (HOSPITAL_COMMUNITY): Payer: Self-pay | Admitting: Psychiatry

## 2019-12-06 DIAGNOSIS — F3131 Bipolar disorder, current episode depressed, mild: Secondary | ICD-10-CM | POA: Diagnosis not present

## 2019-12-06 DIAGNOSIS — F411 Generalized anxiety disorder: Secondary | ICD-10-CM | POA: Diagnosis not present

## 2019-12-06 MED ORDER — LITHIUM CARBONATE ER 300 MG PO TBCR: 300.0000 mg | EXTENDED_RELEASE_TABLET | Freq: Two times a day (BID) | ORAL | 2 refills | Status: DC

## 2019-12-06 MED ORDER — CLONAZEPAM 0.5 MG PO TABS: 0.5000 mg | ORAL_TABLET | Freq: Two times a day (BID) | ORAL | 2 refills | Status: DC

## 2019-12-06 MED ORDER — TRAZODONE HCL 100 MG PO TABS: ORAL_TABLET | ORAL | 2 refills | Status: DC
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
## 2019-12-07 ENCOUNTER — Telehealth (HOSPITAL_COMMUNITY): Payer: Self-pay | Admitting: Psychiatry

## 2019-12-07 NOTE — Telephone Encounter (Signed)
received blood work from PCP. Drawn on 10/13/19. Lithium level 1.o, BUN 6 and Creat o.56. ALT, AST, Glucose are WNL.

## 2020-02-22 ENCOUNTER — Other Ambulatory Visit (HOSPITAL_COMMUNITY): Payer: Self-pay | Admitting: Psychiatry

## 2020-02-22 DIAGNOSIS — F3131 Bipolar disorder, current episode depressed, mild: Secondary | ICD-10-CM

## 2020-02-28 ENCOUNTER — Other Ambulatory Visit (HOSPITAL_COMMUNITY): Payer: Self-pay | Admitting: Psychiatry

## 2020-02-28 DIAGNOSIS — F3131 Bipolar disorder, current episode depressed, mild: Secondary | ICD-10-CM

## 2020-03-07 ENCOUNTER — Other Ambulatory Visit: Payer: Self-pay

## 2020-03-07 ENCOUNTER — Telehealth (INDEPENDENT_AMBULATORY_CARE_PROVIDER_SITE_OTHER): Payer: BLUE CROSS/BLUE SHIELD | Admitting: Psychiatry

## 2020-03-07 ENCOUNTER — Encounter (HOSPITAL_COMMUNITY): Payer: Self-pay | Admitting: Psychiatry

## 2020-03-07 VITALS — Wt 150.0 lb

## 2020-03-07 DIAGNOSIS — F411 Generalized anxiety disorder: Secondary | ICD-10-CM

## 2020-03-07 DIAGNOSIS — F3131 Bipolar disorder, current episode depressed, mild: Secondary | ICD-10-CM | POA: Diagnosis not present

## 2020-03-07 DIAGNOSIS — F410 Panic disorder [episodic paroxysmal anxiety] without agoraphobia: Secondary | ICD-10-CM

## 2020-03-07 MED ORDER — TRAZODONE HCL 100 MG PO TABS: ORAL_TABLET | ORAL | 2 refills | Status: DC

## 2020-03-07 MED ORDER — CLONAZEPAM 0.5 MG PO TABS: ORAL_TABLET | ORAL | 2 refills | Status: DC

## 2020-03-07 MED ORDER — LITHIUM CARBONATE ER 300 MG PO TBCR: 300.0000 mg | EXTENDED_RELEASE_TABLET | Freq: Two times a day (BID) | ORAL | 2 refills | Status: DC
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
## 2020-03-29 ENCOUNTER — Other Ambulatory Visit (HOSPITAL_COMMUNITY): Payer: Self-pay | Admitting: Psychiatry

## 2020-03-29 DIAGNOSIS — F3131 Bipolar disorder, current episode depressed, mild: Secondary | ICD-10-CM

## 2020-04-28 ENCOUNTER — Other Ambulatory Visit (HOSPITAL_COMMUNITY): Payer: Self-pay | Admitting: Psychiatry

## 2020-04-28 DIAGNOSIS — F3131 Bipolar disorder, current episode depressed, mild: Secondary | ICD-10-CM

## 2020-05-25 ENCOUNTER — Other Ambulatory Visit (HOSPITAL_COMMUNITY): Payer: Self-pay | Admitting: Psychiatry

## 2020-05-25 DIAGNOSIS — F3131 Bipolar disorder, current episode depressed, mild: Secondary | ICD-10-CM

## 2020-05-31 ENCOUNTER — Other Ambulatory Visit (HOSPITAL_COMMUNITY): Payer: Self-pay | Admitting: Psychiatry

## 2020-05-31 DIAGNOSIS — F3131 Bipolar disorder, current episode depressed, mild: Secondary | ICD-10-CM

## 2020-06-04 ENCOUNTER — Telehealth (INDEPENDENT_AMBULATORY_CARE_PROVIDER_SITE_OTHER): Payer: BLUE CROSS/BLUE SHIELD | Admitting: Psychiatry

## 2020-06-04 ENCOUNTER — Encounter (HOSPITAL_COMMUNITY): Payer: Self-pay | Admitting: Psychiatry

## 2020-06-04 ENCOUNTER — Other Ambulatory Visit: Payer: Self-pay

## 2020-06-04 VITALS — Wt 137.2 lb

## 2020-06-04 DIAGNOSIS — F411 Generalized anxiety disorder: Secondary | ICD-10-CM | POA: Diagnosis not present

## 2020-06-04 DIAGNOSIS — F3131 Bipolar disorder, current episode depressed, mild: Secondary | ICD-10-CM | POA: Diagnosis not present

## 2020-06-04 DIAGNOSIS — F410 Panic disorder [episodic paroxysmal anxiety] without agoraphobia: Secondary | ICD-10-CM

## 2020-06-04 MED ORDER — LITHIUM CARBONATE ER 300 MG PO TBCR: 300.0000 mg | EXTENDED_RELEASE_TABLET | Freq: Two times a day (BID) | ORAL | 2 refills | Status: DC

## 2020-06-04 MED ORDER — CLONAZEPAM 0.5 MG PO TABS: ORAL_TABLET | ORAL | 2 refills | Status: DC

## 2020-06-04 MED ORDER — TRAZODONE HCL 100 MG PO TABS: ORAL_TABLET | ORAL | 2 refills | Status: DC
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
## 2020-06-27 ENCOUNTER — Other Ambulatory Visit (HOSPITAL_COMMUNITY): Payer: Self-pay | Admitting: Psychiatry

## 2020-06-27 DIAGNOSIS — F3131 Bipolar disorder, current episode depressed, mild: Secondary | ICD-10-CM

## 2020-08-27 ENCOUNTER — Other Ambulatory Visit (HOSPITAL_COMMUNITY): Payer: Self-pay | Admitting: Psychiatry

## 2020-08-27 DIAGNOSIS — F3131 Bipolar disorder, current episode depressed, mild: Secondary | ICD-10-CM

## 2020-09-02 ENCOUNTER — Other Ambulatory Visit (HOSPITAL_COMMUNITY): Payer: Self-pay | Admitting: Psychiatry

## 2020-09-02 DIAGNOSIS — F3131 Bipolar disorder, current episode depressed, mild: Secondary | ICD-10-CM

## 2020-09-03 ENCOUNTER — Other Ambulatory Visit: Payer: Self-pay

## 2020-09-03 ENCOUNTER — Telehealth (HOSPITAL_COMMUNITY): Payer: Self-pay | Admitting: Psychiatry

## 2020-09-05 ENCOUNTER — Other Ambulatory Visit: Payer: Self-pay

## 2020-09-05 ENCOUNTER — Encounter (HOSPITAL_COMMUNITY): Payer: Self-pay | Admitting: Psychiatry

## 2020-09-05 ENCOUNTER — Telehealth (INDEPENDENT_AMBULATORY_CARE_PROVIDER_SITE_OTHER): Payer: Self-pay | Admitting: Psychiatry

## 2020-09-05 DIAGNOSIS — F410 Panic disorder [episodic paroxysmal anxiety] without agoraphobia: Secondary | ICD-10-CM

## 2020-09-05 DIAGNOSIS — F3131 Bipolar disorder, current episode depressed, mild: Secondary | ICD-10-CM

## 2020-09-05 MED ORDER — TRAZODONE HCL 100 MG PO TABS
ORAL_TABLET | ORAL | 2 refills | Status: DC
Start: 2020-09-05 — End: 2020-12-05

## 2020-09-05 MED ORDER — LITHIUM CARBONATE ER 300 MG PO TBCR: 300.0000 mg | EXTENDED_RELEASE_TABLET | Freq: Two times a day (BID) | ORAL | 2 refills | Status: DC

## 2020-09-05 MED ORDER — CLONAZEPAM 0.5 MG PO TABS
ORAL_TABLET | ORAL | 2 refills | Status: DC
Start: 2020-09-05 — End: 2020-12-05
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
## 2020-09-27 ENCOUNTER — Other Ambulatory Visit (HOSPITAL_COMMUNITY): Payer: Self-pay | Admitting: Psychiatry

## 2020-09-27 DIAGNOSIS — F3131 Bipolar disorder, current episode depressed, mild: Secondary | ICD-10-CM

## 2020-10-31 ENCOUNTER — Telehealth (HOSPITAL_COMMUNITY): Payer: Self-pay | Admitting: *Deleted

## 2020-10-31 MED ORDER — DIVALPROEX SODIUM ER 250 MG PO TB24: 250.0000 mg | ORAL_TABLET | Freq: Two times a day (BID) | ORAL | 0 refills | Status: DC

## 2020-10-31 NOTE — Telephone Encounter (Signed)
Nurse spoke with pt regarding d/c Lithium and starting Depakote 250 mg BID. Pt very appreciative and noted that Depakote has worked for her in the past without s/e. Pt encouraged to update office on progress.

## 2020-10-31 NOTE — Telephone Encounter (Signed)
Spoke to [place] at [phone].  Patient recently had a visit with him and she is concerned about weight loss, hair loss, dental issues which she believes related to lithium.  She had never mentioned these concerns to me.  Her last lithium level was 1 which was done almost a year ago.  She like to try Depakote which I agree.  We will discontinue lithium and try Depakote 250 mg twice a day.  I will call in a 30-day prescription to the local pharmacy.  Please call the patient and informed the change.  Hopefully symptoms will get better after stopping the lithium.  If patient feels worsening of the symptoms then she can call us to get an earlier appointment.

## 2020-10-31 NOTE — Telephone Encounter (Signed)
Pt PCP called regarding possible s/e from the Lithium. Provided stated that pt is having issues with her teeth which her dentist relates to the Lithium, as well as alopecia, and has lost 119 lbs since being in medication and family thinks she's dying. Pharmacist also think s/s from Lithium perPCP.  Pt wants to resume Depokate and discontinue the Lithium. The last level in chart is from 10/22/19 @ 1.0. please review and advise. Dr. Jeannetta Nap # is [phone]. Thanks.

## 2020-10-31 NOTE — Telephone Encounter (Signed)
Thanks

## 2020-11-05 ENCOUNTER — Telehealth (HOSPITAL_COMMUNITY): Payer: Self-pay | Admitting: *Deleted

## 2020-11-05 NOTE — Telephone Encounter (Signed)
Lithium level received from Dr. Jeannetta Nap wnl @ 0.9. to be scanned to chart.

## 2020-11-22 ENCOUNTER — Other Ambulatory Visit (HOSPITAL_COMMUNITY): Payer: Self-pay | Admitting: Psychiatry

## 2020-11-26 ENCOUNTER — Other Ambulatory Visit (HOSPITAL_COMMUNITY): Payer: Self-pay | Admitting: Psychiatry

## 2020-11-26 DIAGNOSIS — F3131 Bipolar disorder, current episode depressed, mild: Secondary | ICD-10-CM

## 2020-12-03 ENCOUNTER — Telehealth (HOSPITAL_COMMUNITY): Payer: Self-pay | Admitting: Psychiatry

## 2020-12-05 ENCOUNTER — Encounter (HOSPITAL_COMMUNITY): Payer: Self-pay | Admitting: Psychiatry

## 2020-12-05 ENCOUNTER — Telehealth (INDEPENDENT_AMBULATORY_CARE_PROVIDER_SITE_OTHER): Payer: Self-pay | Admitting: Psychiatry

## 2020-12-05 ENCOUNTER — Other Ambulatory Visit: Payer: Self-pay

## 2020-12-05 VITALS — Wt 126.0 lb

## 2020-12-05 DIAGNOSIS — F410 Panic disorder [episodic paroxysmal anxiety] without agoraphobia: Secondary | ICD-10-CM

## 2020-12-05 DIAGNOSIS — F3131 Bipolar disorder, current episode depressed, mild: Secondary | ICD-10-CM

## 2020-12-05 MED ORDER — CLONAZEPAM 0.5 MG PO TABS: ORAL_TABLET | ORAL | 2 refills | Status: DC

## 2020-12-05 MED ORDER — TRAZODONE HCL 100 MG PO TABS: ORAL_TABLET | ORAL | 2 refills | Status: DC

## 2020-12-05 MED ORDER — DIVALPROEX SODIUM ER 250 MG PO TB24: 250.0000 mg | ORAL_TABLET | Freq: Two times a day (BID) | ORAL | 2 refills | Status: DC
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
## 2020-12-27 ENCOUNTER — Other Ambulatory Visit (HOSPITAL_COMMUNITY): Payer: Self-pay | Admitting: Psychiatry

## 2020-12-27 DIAGNOSIS — F3131 Bipolar disorder, current episode depressed, mild: Secondary | ICD-10-CM

## 2021-03-01 ENCOUNTER — Other Ambulatory Visit (HOSPITAL_COMMUNITY): Payer: Self-pay | Admitting: Psychiatry

## 2021-03-01 DIAGNOSIS — F3131 Bipolar disorder, current episode depressed, mild: Secondary | ICD-10-CM

## 2021-03-03 ENCOUNTER — Other Ambulatory Visit (HOSPITAL_COMMUNITY): Payer: Self-pay | Admitting: Psychiatry

## 2021-03-03 ENCOUNTER — Telehealth (HOSPITAL_COMMUNITY): Payer: Self-pay

## 2021-03-03 DIAGNOSIS — F3131 Bipolar disorder, current episode depressed, mild: Secondary | ICD-10-CM

## 2021-03-03 DIAGNOSIS — F410 Panic disorder [episodic paroxysmal anxiety] without agoraphobia: Secondary | ICD-10-CM

## 2021-03-03 MED ORDER — DIVALPROEX SODIUM ER 250 MG PO TB24: 250.0000 mg | ORAL_TABLET | Freq: Two times a day (BID) | ORAL | 2 refills | Status: DC

## 2021-03-03 MED ORDER — GABAPENTIN 600 MG PO TABS: 600.0000 mg | ORAL_TABLET | Freq: Every day | ORAL | 2 refills | Status: DC

## 2021-03-03 MED ORDER — CLONAZEPAM 0.5 MG PO TABS: ORAL_TABLET | ORAL | 0 refills | Status: DC

## 2021-03-03 MED ORDER — TRAZODONE HCL 100 MG PO TABS: ORAL_TABLET | ORAL | 2 refills | Status: DC

## 2021-03-03 NOTE — Telephone Encounter (Addendum)
This is Dr. Sheela Stack patient. Patient called requesting a refill on her Clonazepam 0.5mg , Depakote ER 250mg , and her Trazodone 100mg . She stated that she doesn't have enough until tomorrow and she's leaving town today to go and help her mother take care of her father. Patient stated she would like to pick them up before she leaves to go out of town. She's using [facility] [address] [facility] [phone]. Please review and advise. Thank you  ATTEMPTED TO CALL PATIENT TO RELAY MESSAGE FROM DR AND TO LET HER KNOW HER MEDICATIONS WERE SENT IN BUT IT WENT TO [facility] AND IT'S NOT SET UP SO WRITER COULDN'T LVM.  WRITER THEN CALLED PHARMACY AND PATIENT IS SET UP FOR TEXT MESSAGING SO WILL BE NOTIFIED BY PHARMACY WHEN THEY'RE READY FOR PICKUP

## 2021-03-03 NOTE — Telephone Encounter (Signed)
Sent. Tell her she was lucky I was able to do it today, we usually need at least 24 hr notice

## 2021-03-12 ENCOUNTER — Telehealth (INDEPENDENT_AMBULATORY_CARE_PROVIDER_SITE_OTHER): Payer: 59 | Admitting: Psychiatry

## 2021-03-12 ENCOUNTER — Other Ambulatory Visit: Payer: Self-pay

## 2021-03-12 ENCOUNTER — Encounter (HOSPITAL_COMMUNITY): Payer: Self-pay | Admitting: Psychiatry

## 2021-03-12 DIAGNOSIS — F410 Panic disorder [episodic paroxysmal anxiety] without agoraphobia: Secondary | ICD-10-CM | POA: Diagnosis not present

## 2021-03-12 DIAGNOSIS — F3131 Bipolar disorder, current episode depressed, mild: Secondary | ICD-10-CM | POA: Diagnosis not present

## 2021-03-12 MED ORDER — CLONAZEPAM 0.5 MG PO TABS: ORAL_TABLET | ORAL | 2 refills | Status: DC

## 2021-03-12 MED ORDER — DIVALPROEX SODIUM ER 250 MG PO TB24: 250.0000 mg | ORAL_TABLET | Freq: Two times a day (BID) | ORAL | 2 refills | Status: DC

## 2021-03-12 MED ORDER — TRAZODONE HCL 100 MG PO TABS: ORAL_TABLET | ORAL | 2 refills | Status: DC
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
## 2021-06-07 ENCOUNTER — Other Ambulatory Visit (HOSPITAL_COMMUNITY): Payer: Self-pay | Admitting: Psychiatry

## 2021-06-10 ENCOUNTER — Telehealth (HOSPITAL_BASED_OUTPATIENT_CLINIC_OR_DEPARTMENT_OTHER): Payer: 59 | Admitting: Psychiatry

## 2021-06-10 ENCOUNTER — Encounter (HOSPITAL_COMMUNITY): Payer: Self-pay | Admitting: Psychiatry

## 2021-06-10 ENCOUNTER — Other Ambulatory Visit: Payer: Self-pay

## 2021-06-10 DIAGNOSIS — F3131 Bipolar disorder, current episode depressed, mild: Secondary | ICD-10-CM | POA: Diagnosis not present

## 2021-06-10 DIAGNOSIS — F410 Panic disorder [episodic paroxysmal anxiety] without agoraphobia: Secondary | ICD-10-CM

## 2021-06-10 MED ORDER — DIVALPROEX SODIUM ER 250 MG PO TB24: 250.0000 mg | ORAL_TABLET | Freq: Two times a day (BID) | ORAL | 2 refills | Status: DC

## 2021-06-10 MED ORDER — TRAZODONE HCL 100 MG PO TABS: ORAL_TABLET | ORAL | 2 refills | Status: DC

## 2021-06-10 MED ORDER — CLONAZEPAM 0.5 MG PO TABS: ORAL_TABLET | ORAL | 2 refills | Status: DC
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
## 2021-09-09 ENCOUNTER — Encounter (HOSPITAL_COMMUNITY): Payer: Self-pay | Admitting: Psychiatry

## 2021-09-09 ENCOUNTER — Other Ambulatory Visit: Payer: Self-pay

## 2021-09-09 ENCOUNTER — Telehealth (HOSPITAL_BASED_OUTPATIENT_CLINIC_OR_DEPARTMENT_OTHER): Payer: 59 | Admitting: Psychiatry

## 2021-09-09 DIAGNOSIS — F410 Panic disorder [episodic paroxysmal anxiety] without agoraphobia: Secondary | ICD-10-CM

## 2021-09-09 DIAGNOSIS — F3131 Bipolar disorder, current episode depressed, mild: Secondary | ICD-10-CM

## 2021-09-09 MED ORDER — CLONAZEPAM 0.5 MG PO TABS: ORAL_TABLET | ORAL | 2 refills | Status: DC

## 2021-09-09 MED ORDER — DIVALPROEX SODIUM ER 250 MG PO TB24: 250.0000 mg | ORAL_TABLET | Freq: Two times a day (BID) | ORAL | 2 refills | Status: DC

## 2021-09-09 MED ORDER — TRAZODONE HCL 100 MG PO TABS: ORAL_TABLET | ORAL | 2 refills | Status: DC
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
## 2021-12-09 ENCOUNTER — Encounter (HOSPITAL_COMMUNITY): Payer: Self-pay | Admitting: Psychiatry

## 2021-12-09 ENCOUNTER — Telehealth (HOSPITAL_BASED_OUTPATIENT_CLINIC_OR_DEPARTMENT_OTHER): Payer: Self-pay | Admitting: Psychiatry

## 2021-12-09 DIAGNOSIS — F3131 Bipolar disorder, current episode depressed, mild: Secondary | ICD-10-CM

## 2021-12-09 DIAGNOSIS — F410 Panic disorder [episodic paroxysmal anxiety] without agoraphobia: Secondary | ICD-10-CM

## 2021-12-09 MED ORDER — TRAZODONE HCL 100 MG PO TABS: ORAL_TABLET | ORAL | 2 refills | Status: DC

## 2021-12-09 MED ORDER — CLONAZEPAM 0.5 MG PO TABS: ORAL_TABLET | ORAL | 2 refills | Status: DC

## 2021-12-09 MED ORDER — DIVALPROEX SODIUM ER 250 MG PO TB24: 250.0000 mg | ORAL_TABLET | Freq: Two times a day (BID) | ORAL | 2 refills | Status: DC
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
## 2022-03-10 ENCOUNTER — Encounter (HOSPITAL_COMMUNITY): Payer: Self-pay | Admitting: Psychiatry

## 2022-03-10 ENCOUNTER — Telehealth (HOSPITAL_BASED_OUTPATIENT_CLINIC_OR_DEPARTMENT_OTHER): Payer: Self-pay | Admitting: Psychiatry

## 2022-03-10 DIAGNOSIS — F410 Panic disorder [episodic paroxysmal anxiety] without agoraphobia: Secondary | ICD-10-CM

## 2022-03-10 DIAGNOSIS — F3131 Bipolar disorder, current episode depressed, mild: Secondary | ICD-10-CM

## 2022-03-10 MED ORDER — CLONAZEPAM 0.5 MG PO TABS: ORAL_TABLET | ORAL | 2 refills | Status: AC

## 2022-03-10 MED ORDER — DIVALPROEX SODIUM ER 250 MG PO TB24: 250.0000 mg | ORAL_TABLET | Freq: Two times a day (BID) | ORAL | 2 refills | Status: DC

## 2022-03-10 MED ORDER — TRAZODONE HCL 100 MG PO TABS: ORAL_TABLET | ORAL | 2 refills | Status: DC
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
## 2022-05-19 ENCOUNTER — Emergency Department (HOSPITAL_COMMUNITY): Payer: Medicaid Other

## 2022-05-19 ENCOUNTER — Inpatient Hospital Stay (HOSPITAL_COMMUNITY)
Admission: EM | Admit: 2022-05-19 | Discharge: 2022-06-25 | DRG: 004 | Disposition: A | Discharge status: Home Health | Payer: Medicaid Other | Attending: Internal Medicine | Admitting: Internal Medicine

## 2022-05-19 DIAGNOSIS — L8990 Pressure ulcer of unspecified site, unspecified stage: Secondary | ICD-10-CM | POA: Insufficient documentation

## 2022-05-19 DIAGNOSIS — Z9911 Dependence on respirator [ventilator] status: Secondary | ICD-10-CM | POA: Diagnosis not present

## 2022-05-19 DIAGNOSIS — J13 Pneumonia due to Streptococcus pneumoniae: Principal | ICD-10-CM | POA: Diagnosis present

## 2022-05-19 DIAGNOSIS — Z597 Insufficient social insurance and welfare support: Secondary | ICD-10-CM

## 2022-05-19 DIAGNOSIS — S2239XA Fracture of one rib, unspecified side, initial encounter for closed fracture: Secondary | ICD-10-CM | POA: Insufficient documentation

## 2022-05-19 DIAGNOSIS — L89150 Pressure ulcer of sacral region, unstageable: Secondary | ICD-10-CM | POA: Diagnosis present

## 2022-05-19 DIAGNOSIS — R6521 Severe sepsis with septic shock: Secondary | ICD-10-CM | POA: Diagnosis not present

## 2022-05-19 DIAGNOSIS — Z789 Other specified health status: Secondary | ICD-10-CM | POA: Diagnosis not present

## 2022-05-19 DIAGNOSIS — J90 Pleural effusion, not elsewhere classified: Secondary | ICD-10-CM

## 2022-05-19 DIAGNOSIS — F1010 Alcohol abuse, uncomplicated: Secondary | ICD-10-CM | POA: Diagnosis present

## 2022-05-19 DIAGNOSIS — M96A3 Multiple fractures of ribs associated with chest compression and cardiopulmonary resuscitation: Secondary | ICD-10-CM | POA: Diagnosis present

## 2022-05-19 DIAGNOSIS — D638 Anemia in other chronic diseases classified elsewhere: Secondary | ICD-10-CM | POA: Diagnosis present

## 2022-05-19 DIAGNOSIS — E872 Acidosis, unspecified: Secondary | ICD-10-CM

## 2022-05-19 DIAGNOSIS — G929 Unspecified toxic encephalopathy: Secondary | ICD-10-CM | POA: Diagnosis present

## 2022-05-19 DIAGNOSIS — L89626 Pressure-induced deep tissue damage of left heel: Secondary | ICD-10-CM | POA: Diagnosis present

## 2022-05-19 DIAGNOSIS — T17998A Other foreign object in respiratory tract, part unspecified causing other injury, initial encounter: Secondary | ICD-10-CM

## 2022-05-19 DIAGNOSIS — I5032 Chronic diastolic (congestive) heart failure: Secondary | ICD-10-CM | POA: Diagnosis not present

## 2022-05-19 DIAGNOSIS — Z430 Encounter for attention to tracheostomy: Secondary | ICD-10-CM | POA: Diagnosis not present

## 2022-05-19 DIAGNOSIS — I5031 Acute diastolic (congestive) heart failure: Secondary | ICD-10-CM | POA: Diagnosis not present

## 2022-05-19 DIAGNOSIS — E43 Unspecified severe protein-calorie malnutrition: Secondary | ICD-10-CM | POA: Diagnosis present

## 2022-05-19 DIAGNOSIS — D539 Nutritional anemia, unspecified: Secondary | ICD-10-CM | POA: Diagnosis not present

## 2022-05-19 DIAGNOSIS — R5382 Chronic fatigue, unspecified: Secondary | ICD-10-CM | POA: Diagnosis present

## 2022-05-19 DIAGNOSIS — G9341 Metabolic encephalopathy: Secondary | ICD-10-CM | POA: Diagnosis present

## 2022-05-19 DIAGNOSIS — R197 Diarrhea, unspecified: Secondary | ICD-10-CM | POA: Diagnosis not present

## 2022-05-19 DIAGNOSIS — Z91199 Patient's noncompliance with other medical treatment and regimen due to unspecified reason: Secondary | ICD-10-CM

## 2022-05-19 DIAGNOSIS — N170 Acute kidney failure with tubular necrosis: Secondary | ICD-10-CM | POA: Diagnosis present

## 2022-05-19 DIAGNOSIS — A403 Sepsis due to Streptococcus pneumoniae: Secondary | ICD-10-CM | POA: Diagnosis not present

## 2022-05-19 DIAGNOSIS — Z79899 Other long term (current) drug therapy: Secondary | ICD-10-CM

## 2022-05-19 DIAGNOSIS — I959 Hypotension, unspecified: Secondary | ICD-10-CM | POA: Diagnosis not present

## 2022-05-19 DIAGNOSIS — R7989 Other specified abnormal findings of blood chemistry: Secondary | ICD-10-CM | POA: Diagnosis present

## 2022-05-19 DIAGNOSIS — Z930 Tracheostomy status: Secondary | ICD-10-CM

## 2022-05-19 DIAGNOSIS — R1310 Dysphagia, unspecified: Secondary | ICD-10-CM

## 2022-05-19 DIAGNOSIS — F319 Bipolar disorder, unspecified: Secondary | ICD-10-CM | POA: Diagnosis present

## 2022-05-19 DIAGNOSIS — I468 Cardiac arrest due to other underlying condition: Secondary | ICD-10-CM | POA: Diagnosis present

## 2022-05-19 DIAGNOSIS — F1210 Cannabis abuse, uncomplicated: Secondary | ICD-10-CM | POA: Diagnosis present

## 2022-05-19 DIAGNOSIS — K264 Chronic or unspecified duodenal ulcer with hemorrhage: Secondary | ICD-10-CM | POA: Diagnosis not present

## 2022-05-19 DIAGNOSIS — J189 Pneumonia, unspecified organism: Secondary | ICD-10-CM

## 2022-05-19 DIAGNOSIS — F419 Anxiety disorder, unspecified: Secondary | ICD-10-CM | POA: Diagnosis present

## 2022-05-19 DIAGNOSIS — R627 Adult failure to thrive: Secondary | ICD-10-CM | POA: Diagnosis present

## 2022-05-19 DIAGNOSIS — E8809 Other disorders of plasma-protein metabolism, not elsewhere classified: Secondary | ICD-10-CM | POA: Diagnosis not present

## 2022-05-19 DIAGNOSIS — R9389 Abnormal findings on diagnostic imaging of other specified body structures: Secondary | ICD-10-CM | POA: Diagnosis not present

## 2022-05-19 DIAGNOSIS — Z1152 Encounter for screening for COVID-19: Secondary | ICD-10-CM | POA: Diagnosis not present

## 2022-05-19 DIAGNOSIS — J690 Pneumonitis due to inhalation of food and vomit: Secondary | ICD-10-CM | POA: Diagnosis not present

## 2022-05-19 DIAGNOSIS — F17210 Nicotine dependence, cigarettes, uncomplicated: Secondary | ICD-10-CM | POA: Diagnosis present

## 2022-05-19 DIAGNOSIS — R451 Restlessness and agitation: Secondary | ICD-10-CM | POA: Diagnosis present

## 2022-05-19 DIAGNOSIS — H6593 Unspecified nonsuppurative otitis media, bilateral: Secondary | ICD-10-CM | POA: Diagnosis not present

## 2022-05-19 DIAGNOSIS — T68XXXA Hypothermia, initial encounter: Secondary | ICD-10-CM | POA: Diagnosis present

## 2022-05-19 DIAGNOSIS — E870 Hyperosmolality and hypernatremia: Secondary | ICD-10-CM | POA: Diagnosis not present

## 2022-05-19 DIAGNOSIS — E876 Hypokalemia: Secondary | ICD-10-CM | POA: Diagnosis present

## 2022-05-19 DIAGNOSIS — I469 Cardiac arrest, cause unspecified: Secondary | ICD-10-CM | POA: Diagnosis present

## 2022-05-19 DIAGNOSIS — D75839 Thrombocytosis, unspecified: Secondary | ICD-10-CM | POA: Diagnosis not present

## 2022-05-19 DIAGNOSIS — Z818 Family history of other mental and behavioral disorders: Secondary | ICD-10-CM

## 2022-05-19 DIAGNOSIS — Y95 Nosocomial condition: Secondary | ICD-10-CM | POA: Diagnosis not present

## 2022-05-19 DIAGNOSIS — Z781 Physical restraint status: Secondary | ICD-10-CM

## 2022-05-19 DIAGNOSIS — J9600 Acute respiratory failure, unspecified whether with hypoxia or hypercapnia: Secondary | ICD-10-CM

## 2022-05-19 DIAGNOSIS — J9811 Atelectasis: Secondary | ICD-10-CM | POA: Diagnosis not present

## 2022-05-19 DIAGNOSIS — Z8616 Personal history of COVID-19: Secondary | ICD-10-CM

## 2022-05-19 DIAGNOSIS — T17590A Other foreign object in bronchus causing asphyxiation, initial encounter: Secondary | ICD-10-CM | POA: Diagnosis not present

## 2022-05-19 DIAGNOSIS — R64 Cachexia: Secondary | ICD-10-CM | POA: Diagnosis present

## 2022-05-19 DIAGNOSIS — Z681 Body mass index (BMI) 19 or less, adult: Secondary | ICD-10-CM

## 2022-05-19 DIAGNOSIS — L89616 Pressure-induced deep tissue damage of right heel: Secondary | ICD-10-CM | POA: Diagnosis present

## 2022-05-19 DIAGNOSIS — Z888 Allergy status to other drugs, medicaments and biological substances status: Secondary | ICD-10-CM

## 2022-05-19 DIAGNOSIS — G8929 Other chronic pain: Secondary | ICD-10-CM | POA: Diagnosis present

## 2022-05-19 DIAGNOSIS — F4322 Adjustment disorder with anxiety: Secondary | ICD-10-CM | POA: Diagnosis not present

## 2022-05-19 DIAGNOSIS — J1569 Pneumonia due to other gram-negative bacteria: Secondary | ICD-10-CM | POA: Diagnosis not present

## 2022-05-19 DIAGNOSIS — E871 Hypo-osmolality and hyponatremia: Secondary | ICD-10-CM | POA: Diagnosis not present

## 2022-05-19 DIAGNOSIS — R319 Hematuria, unspecified: Secondary | ICD-10-CM | POA: Diagnosis present

## 2022-05-19 DIAGNOSIS — M6282 Rhabdomyolysis: Secondary | ICD-10-CM | POA: Diagnosis present

## 2022-05-19 DIAGNOSIS — J9601 Acute respiratory failure with hypoxia: Secondary | ICD-10-CM | POA: Insufficient documentation

## 2022-05-19 DIAGNOSIS — Z515 Encounter for palliative care: Secondary | ICD-10-CM

## 2022-05-19 DIAGNOSIS — D62 Acute posthemorrhagic anemia: Secondary | ICD-10-CM | POA: Diagnosis not present

## 2022-05-19 DIAGNOSIS — Z7189 Other specified counseling: Secondary | ICD-10-CM | POA: Diagnosis not present

## 2022-05-19 DIAGNOSIS — T7601XA Adult neglect or abandonment, suspected, initial encounter: Secondary | ICD-10-CM | POA: Diagnosis present

## 2022-05-19 DIAGNOSIS — B961 Klebsiella pneumoniae [K. pneumoniae] as the cause of diseases classified elsewhere: Secondary | ICD-10-CM | POA: Diagnosis not present

## 2022-05-19 DIAGNOSIS — F1110 Opioid abuse, uncomplicated: Secondary | ICD-10-CM | POA: Diagnosis present

## 2022-05-19 DIAGNOSIS — E8729 Other acidosis: Secondary | ICD-10-CM | POA: Diagnosis present

## 2022-05-19 DIAGNOSIS — B370 Candidal stomatitis: Secondary | ICD-10-CM | POA: Diagnosis not present

## 2022-05-19 DIAGNOSIS — R112 Nausea with vomiting, unspecified: Secondary | ICD-10-CM | POA: Diagnosis present

## 2022-05-19 DIAGNOSIS — L89890 Pressure ulcer of other site, unstageable: Secondary | ICD-10-CM | POA: Diagnosis present

## 2022-05-19 DIAGNOSIS — R4182 Altered mental status, unspecified: Secondary | ICD-10-CM | POA: Diagnosis not present

## 2022-05-19 DIAGNOSIS — E875 Hyperkalemia: Secondary | ICD-10-CM | POA: Diagnosis present

## 2022-05-19 DIAGNOSIS — B3781 Candidal esophagitis: Secondary | ICD-10-CM | POA: Diagnosis not present

## 2022-05-19 LAB — CBC WITH DIFFERENTIAL/PLATELET
Abs Immature Granulocytes: 1.04 10*3/uL — ABNORMAL HIGH (ref 0.00–0.07)
Basophils Absolute: 0 10*3/uL (ref 0.0–0.1)
Basophils Relative: 0 %
Eosinophils Absolute: 0 10*3/uL (ref 0.0–0.5)
Eosinophils Relative: 0 %
HCT: 36.5 % (ref 36.0–46.0)
Hemoglobin: 11.8 g/dL — ABNORMAL LOW (ref 12.0–15.0)
Immature Granulocytes: 4 %
Lymphocytes Relative: 9 %
Lymphs Abs: 2.3 10*3/uL (ref 0.7–4.0)
MCH: 31.5 pg (ref 26.0–34.0)
MCHC: 32.3 g/dL (ref 30.0–36.0)
MCV: 97.3 fL (ref 80.0–100.0)
Monocytes Absolute: 0.4 10*3/uL (ref 0.1–1.0)
Monocytes Relative: 2 %
Neutro Abs: 22.4 10*3/uL — ABNORMAL HIGH (ref 1.7–7.7)
Neutrophils Relative %: 85 %
Platelets: 237 10*3/uL (ref 150–400)
RBC: 3.75 MIL/uL — ABNORMAL LOW (ref 3.87–5.11)
RDW: 14 % (ref 11.5–15.5)
WBC: 26.1 10*3/uL — ABNORMAL HIGH (ref 4.0–10.5)
nRBC: 0 % (ref 0.0–0.2)

## 2022-05-19 LAB — I-STAT CHEM 8, ED
BUN: 38 mg/dL — ABNORMAL HIGH (ref 6–20)
Calcium, Ion: 1.17 mmol/L (ref 1.15–1.40)
Chloride: 99 mmol/L (ref 98–111)
Creatinine, Ser: 1.2 mg/dL — ABNORMAL HIGH (ref 0.44–1.00)
Glucose, Bld: 138 mg/dL — ABNORMAL HIGH (ref 70–99)
HCT: 38 % (ref 36.0–46.0)
Hemoglobin: 12.9 g/dL (ref 12.0–15.0)
Potassium: 5.3 mmol/L — ABNORMAL HIGH (ref 3.5–5.1)
Sodium: 133 mmol/L — ABNORMAL LOW (ref 135–145)
TCO2: 22 mmol/L (ref 22–32)

## 2022-05-19 LAB — ACETAMINOPHEN LEVEL: Acetaminophen (Tylenol), Serum: <10 ug/mL — ABNORMAL LOW (ref 10–30)

## 2022-05-19 LAB — I-STAT ARTERIAL BLOOD GAS, ED
Acid-base deficit: 8 mmol/L — ABNORMAL HIGH (ref 0.0–2.0)
Bicarbonate: 21.2 mmol/L (ref 20.0–28.0)
Calcium, Ion: 1.2 mmol/L (ref 1.15–1.40)
HCT: 32 % — ABNORMAL LOW (ref 36.0–46.0)
Hemoglobin: 10.9 g/dL — ABNORMAL LOW (ref 12.0–15.0)
O2 Saturation: 96 %
Patient temperature: 87.8
Potassium: 5.2 mmol/L — ABNORMAL HIGH (ref 3.5–5.1)
Sodium: 134 mmol/L — ABNORMAL LOW (ref 135–145)
TCO2: 23 mmol/L (ref 22–32)
pCO2 arterial: 49.1 mmHg — ABNORMAL HIGH (ref 32–48)
pH, Arterial: 7.206 — ABNORMAL LOW (ref 7.35–7.45)
pO2, Arterial: 82 mmHg — ABNORMAL LOW (ref 83–108)

## 2022-05-19 LAB — COMPREHENSIVE METABOLIC PANEL
ALT: 132 U/L — ABNORMAL HIGH (ref 0–44)
AST: 435 U/L — ABNORMAL HIGH (ref 15–41)
Albumin: 1.8 g/dL — ABNORMAL LOW (ref 3.5–5.0)
Alkaline Phosphatase: 83 U/L (ref 38–126)
Anion gap: 22 — ABNORMAL HIGH (ref 5–15)
BUN: 32 mg/dL — ABNORMAL HIGH (ref 6–20)
CO2: 20 mmol/L — ABNORMAL LOW (ref 22–32)
Calcium: 9.4 mg/dL (ref 8.9–10.3)
Chloride: 96 mmol/L — ABNORMAL LOW (ref 98–111)
Creatinine, Ser: 1.05 mg/dL — ABNORMAL HIGH (ref 0.44–1.00)
GFR, Estimated: >60 mL/min (ref >60)
Glucose, Bld: 144 mg/dL — ABNORMAL HIGH (ref 70–99)
Potassium: 5.5 mmol/L — ABNORMAL HIGH (ref 3.5–5.1)
Sodium: 138 mmol/L (ref 135–145)
Total Bilirubin: 1.6 mg/dL — ABNORMAL HIGH (ref 0.3–1.2)
Total Protein: 4.6 g/dL — ABNORMAL LOW (ref 6.5–8.1)

## 2022-05-19 LAB — PROCALCITONIN: Procalcitonin: 0.97 ng/mL

## 2022-05-19 LAB — RESP PANEL BY RT-PCR (FLU A&B, COVID) ARPGX2
Influenza A by PCR: NEGATIVE
Influenza B by PCR: NEGATIVE
SARS Coronavirus 2 by RT PCR: NEGATIVE

## 2022-05-19 LAB — I-STAT VENOUS BLOOD GAS, ED
Acid-base deficit: 9 mmol/L — ABNORMAL HIGH (ref 0.0–2.0)
Bicarbonate: 20.4 mmol/L (ref 20.0–28.0)
Calcium, Ion: 1.18 mmol/L (ref 1.15–1.40)
HCT: 37 % (ref 36.0–46.0)
Hemoglobin: 12.6 g/dL (ref 12.0–15.0)
O2 Saturation: 98 %
Potassium: 5.3 mmol/L — ABNORMAL HIGH (ref 3.5–5.1)
Sodium: 132 mmol/L — ABNORMAL LOW (ref 135–145)
TCO2: 22 mmol/L (ref 22–32)
pCO2, Ven: 57.6 mmHg (ref 44–60)
pH, Ven: 7.156 — CL (ref 7.25–7.43)
pO2, Ven: 138 mmHg — ABNORMAL HIGH (ref 32–45)

## 2022-05-19 LAB — SALICYLATE LEVEL: Salicylate Lvl: 7.1 mg/dL (ref 7.0–30.0)

## 2022-05-19 LAB — LITHIUM LEVEL: Lithium Lvl: <0.06 mmol/L — ABNORMAL LOW (ref 0.60–1.20)

## 2022-05-19 LAB — LACTIC ACID, PLASMA: Lactic Acid, Venous: >9 mmol/L (ref 0.5–1.9)

## 2022-05-19 LAB — LIPASE, BLOOD: Lipase: 26 U/L (ref 11–51)

## 2022-05-19 LAB — MAGNESIUM: Magnesium: 2.4 mg/dL (ref 1.7–2.4)

## 2022-05-19 LAB — TROPONIN I (HIGH SENSITIVITY): Troponin I (High Sensitivity): 129 ng/L (ref <18)

## 2022-05-19 LAB — CBG MONITORING, ED: Glucose-Capillary: 130 mg/dL — ABNORMAL HIGH (ref 70–99)

## 2022-05-19 MED ORDER — NOREPINEPHRINE 4 MG/250ML-% IV SOLN
INTRAVENOUS | Status: AC
  Administered 2022-05-19: 2 ug/min
  Filled 2022-05-19: qty 250

## 2022-05-19 MED ORDER — VANCOMYCIN HCL 1500 MG/300ML IV SOLN
1500.0000 mg | Freq: Once | INTRAVENOUS | Status: DC
  Filled 2022-05-19: qty 300

## 2022-05-19 MED ORDER — SODIUM CHLORIDE 0.9 % IV SOLN
2.0000 g | Freq: Once | INTRAVENOUS | Status: AC
  Administered 2022-05-19: 2 g via INTRAVENOUS
  Filled 2022-05-19: qty 12.5

## 2022-05-19 MED ORDER — HEPARIN SODIUM (PORCINE) 5000 UNIT/ML IJ SOLN
5000.0000 [IU] | Freq: Three times a day (TID) | INTRAMUSCULAR | Status: DC
  Administered 2022-05-20 – 2022-06-24 (×104): 5000 [IU] via SUBCUTANEOUS
  Filled 2022-05-19 (×105): qty 1

## 2022-05-19 MED ORDER — PROPOFOL 1000 MG/100ML IV EMUL
5.0000 ug/kg/min | INTRAVENOUS | Status: DC
  Administered 2022-05-20: 10 ug/kg/min via INTRAVENOUS
  Filled 2022-05-19: qty 100

## 2022-05-19 MED ORDER — VANCOMYCIN HCL IN DEXTROSE 1-5 GM/200ML-% IV SOLN: 1000.0000 mg | INTRAVENOUS | Status: DC

## 2022-05-19 MED ORDER — LACTATED RINGERS IV BOLUS
1000.0000 mL | Freq: Once | INTRAVENOUS | Status: AC
  Administered 2022-05-19: 1000 mL via INTRAVENOUS

## 2022-05-19 MED ORDER — ROCURONIUM BROMIDE 50 MG/5ML IV SOLN
INTRAVENOUS | Status: AC | PRN
  Administered 2022-05-19: 80 mg via INTRAVENOUS

## 2022-05-19 MED ORDER — SODIUM CHLORIDE 0.9 % IV SOLN: 2.0000 g | Freq: Two times a day (BID) | INTRAVENOUS | Status: DC

## 2022-05-19 MED ORDER — DOCUSATE SODIUM 50 MG/5ML PO LIQD: 100.0000 mg | Freq: Two times a day (BID) | ORAL | Status: DC | PRN

## 2022-05-19 MED ORDER — LIDOCAINE HCL (PF) 1 % IJ SOLN
INTRAMUSCULAR | Status: AC
  Filled 2022-05-19: qty 5

## 2022-05-19 MED ORDER — PANTOPRAZOLE SODIUM 40 MG IV SOLR
40.0000 mg | Freq: Every day | INTRAVENOUS | Status: DC
  Administered 2022-05-19: 40 mg via INTRAVENOUS
  Filled 2022-05-19: qty 10

## 2022-05-19 MED ORDER — VANCOMYCIN HCL 1250 MG/250ML IV SOLN
1250.0000 mg | Freq: Once | INTRAVENOUS | Status: AC
  Administered 2022-05-19: 1250 mg via INTRAVENOUS
  Filled 2022-05-19: qty 250

## 2022-05-19 MED ORDER — PANTOPRAZOLE 2 MG/ML SUSPENSION: 40.0000 mg | Freq: Every day | ORAL | Status: DC

## 2022-05-19 MED ORDER — ONDANSETRON HCL 4 MG/2ML IJ SOLN: 4.0000 mg | Freq: Four times a day (QID) | INTRAMUSCULAR | Status: DC | PRN

## 2022-05-19 MED ORDER — PROPOFOL 1000 MG/100ML IV EMUL: 5.0000 ug/kg/min | INTRAVENOUS | Status: DC

## 2022-05-19 MED ORDER — VANCOMYCIN HCL 750 MG/150ML IV SOLN: 750.0000 mg | INTRAVENOUS | Status: DC

## 2022-05-19 MED ORDER — DOCUSATE SODIUM 100 MG PO CAPS: 100.0000 mg | ORAL_CAPSULE | Freq: Two times a day (BID) | ORAL | Status: DC | PRN

## 2022-05-19 MED ORDER — IOHEXOL 350 MG/ML SOLN
75.0000 mL | Freq: Once | INTRAVENOUS | Status: AC | PRN
  Administered 2022-05-19: 75 mL via INTRAVENOUS

## 2022-05-19 MED ORDER — ETOMIDATE 2 MG/ML IV SOLN
INTRAVENOUS | Status: AC | PRN
  Administered 2022-05-19: 10 mg via INTRAVENOUS

## 2022-05-19 MED ORDER — POLYETHYLENE GLYCOL 3350 17 G PO PACK: 17.0000 g | PACK | Freq: Every day | ORAL | Status: DC | PRN

## 2022-05-19 NOTE — ED Notes (Signed)
Family in consultation room.

## 2022-05-19 NOTE — Progress Notes (Signed)
   05/19/22 2106  Clinical Encounter Type  Visited With Patient and family together  Visit Type Code  Referral From Nurse  Consult/Referral To Chaplain   [place] responded to page to provide support to the family. CH accompanied MD to consultation room A to provide medical update to the family. [place] actively listened to the patient's husband as he expressed his emotions and concerns about the patient's health history. CH validated his emotions by assured  him it is ok for him to have those feelings. CH also provided emotional support and validation to the patient's daughter who expressed fear of seeing her mother. Escorted the patient's husband, son, daughter, patient's mother and sister to the patient's room. Spiritual support of prayer offered. Family expressed ok for them to be alone in consult room while waiting on ICU MD to speak with them. Advise family that [facility] remains available for follow up support as needed. This note was prepared by Jeanine Luz, M.Div..  For questions please contact by phone [phone].

## 2022-05-19 NOTE — Progress Notes (Addendum)
Pharmacy Antibiotic Note  Monica Hopkins is a 51 y.o. female admitted on 05/19/2022 s/p cardiac arrest. New concern for pneumonia.  Pharmacy has been consulted for vancomycin and cefepime dosing.Patient potentially with slight AKI, Scr 1.20, last was reading from 2020 was 0.80. No fever curve or WBC currently.   Plan: Start cefepime 2g every 12 hours.  Give IV Vancomycin 1250 x 1 for loading dose, followed by IV Vancomycin 750 every 24 hours for eAUC 511.  Follow culture data for de-escalation.  Monitor renal function for dose adjustments as indicated.   Weight updated @ 22:00 to 50kg Height: 5\' 6"  (167.6 cm) Weight: 61 kg (134 lb 7.7 oz) IBW/kg (Calculated) : 59.3, No data recorded.  Recent Labs  Lab 05/19/22 2037  CREATININE 1.20*    Estimated Creatinine Clearance: 51.9 mL/min (A) (by C-G formula based on SCr of 1.2 mg/dL (H)).    Allergies  Allergen Reactions   Abilify [Aripiprazole] Swelling and Rash   Lamictal [Lamotrigine] Swelling and Rash   Latuda [Lurasidone] Other (See Comments)    Caused leg weakness    Thank you for allowing pharmacy to be a part of this patient's care.  Ventura Sellers 05/19/2022 9:03 PM

## 2022-05-19 NOTE — H&P (Incomplete)
NAME:  Monica Hopkins, MRN:  [id number], DOB:  12/16/1970, LOS: 0 ADMISSION DATE:  05/19/2022, CONSULTATION DATE:  05/19/22 REFERRING MD:  Dalene Seltzer, CHIEF COMPLAINT:  found down, cardiac arrest    History of Present Illness:  51 yo woman with hx of bipolar disorder,  here after cardiac arrest.   Per family has been feeling sick and taking trazodone and benadryl at night. They thought she had covid a couple of weeks ago, then recovered somewhat.  For past two weeks has been sob intermittently with some minimal cough, fevers, sore throat.  Her husband came home and she was alert and talking, he went to the bathroom.  10- min later he came back to find her unconscious.  He started CPR immediately, EMS came fairly quickly.  She was reportedly found to be in PEA.  Given 1 mg epi, also lidocaine.  Found once to be in shockable rhythm, shocked once.  Rosc after about 26 min, while in route.   Per family has undergone large weight loss  a few years ago, unclear cause.  Patient had felt it was from lithium but unclear when last takng this..  Poor po intake from low appeitite, nausea and vomiting.  She apparently has not wanted to go to visit the dr.  She recently has only done phone visits with psychiatry.   Sleeps sitting up in recliner.   Per her mother, she has not felt well in years, has complained of some virus or other acute illness affecting her for the past year.   Intubated in ED ( Had king airway in place) at 820 per drug record.  In ED hypothermic  ABG 7.206/49/82 Cefepime and vanc ordered.   AKI cr 1.2 from 0.79 WBC 14.3   Home meds klonipin, trazodone depakote.   Pertinent  Medical History  Bipolar disorder Unexplained weight loss  Poor dentition.  Significant Hospital Events: Including procedures, antibiotic start and stop dates in addition to other pertinent events     Interim History / Subjective:    Objective   Blood pressure 108/84, pulse 88, temperature (!) 90.7  F (32.6 C), temperature source Core, resp. rate 18, height 5\' 6"  (1.676 m), weight 50 kg, SpO2 97 %.    Vent Mode: PRVC FiO2 (%):  [80 %] 80 % Set Rate:  [15 bmp] 15 bmp Vt Set:  [470 mL] 470 mL PEEP:  [5 cmH20] 5 cmH20 Plateau Pressure:  [15 cmH20] 15 cmH20  No intake or output data in the 24 hours ending 05/19/22 2158 Filed Weights   05/19/22 2000 05/19/22 2112  Weight: 61 kg 50 kg    Examination: General: nad, non responsive on vent, temporal wasting.  HENT: pupils 61mm and non responsive to light   Lungs: CTAB Cardiovascular: rrr no mgr  Abdomen: thin non distendeded, BS present  Extremities: thin Neuro: non resonsive to painful stimuli   Resolved Hospital Problem list    Assessment & Plan:  Cardiac arrest - likely based on multifocal pneumonia seen on CT chest.   Poor prognosis from neuro standpoint.  Appears to have severe anoxic injury.   Echo is pending, TSH pending.   Continue supportive care.  Currently being gradually rewarmed as is hypothermic.  Targeted temp management if needed beyond rewarming to avoid fever.   Cont mech vent with Lung protective strategies.   Minimal pressor needed at this time.   Cont broad spectrum antibiotics for PNA.    Anxiety depression - at home was taking Klonipin,  depakote and trazodone.   Family doubts intentional overdose.   Discussed poor prognosis with large family, including husband, daughter, son, her mother and father and sister.     Best Practice (right click and "Reselect all SmartList Selections" daily)   Diet/type: NPO DVT prophylaxis: heparin  GI prophylaxis: PPI Lines: N/A Foley:  none Code Status:  full code Last date of multidisciplinary goals of care discussion []   Labs   CBC: Recent Labs  Lab 05/19/22 2037 05/19/22 2107  HGB 12.6  12.9 10.9*  HCT 37.0  38.0 32.0*    Basic Metabolic Panel: Recent Labs  Lab 05/19/22 2037 05/19/22 2107  NA 132*  133* 134*  K 5.3*  5.3* 5.2*  CL 99  --    GLUCOSE 138*  --   BUN 38*  --   CREATININE 1.20*  --    GFR: Estimated Creatinine Clearance: 43.8 mL/min (A) (by C-G formula based on SCr of 1.2 mg/dL (H)). No results for input(s): "PROCALCITON", "WBC", "LATICACIDVEN" in the last 168 hours.  Liver Function Tests: No results for input(s): "AST", "ALT", "ALKPHOS", "BILITOT", "PROT", "ALBUMIN" in the last 168 hours. No results for input(s): "LIPASE", "AMYLASE" in the last 168 hours. No results for input(s): "AMMONIA" in the last 168 hours.  ABG    Component Value Date/Time   PHART 7.206 (L) 05/19/2022 2107   PCO2ART 49.1 (H) 05/19/2022 2107   PO2ART 82 (L) 05/19/2022 2107   HCO3 21.2 05/19/2022 2107   TCO2 23 05/19/2022 2107   ACIDBASEDEF 8.0 (H) 05/19/2022 2107   O2SAT 96 05/19/2022 2107     Coagulation Profile: No results for input(s): "INR", "PROTIME" in the last 168 hours.  Cardiac Enzymes: No results for input(s): "CKTOTAL", "CKMB", "CKMBINDEX", "TROPONINI" in the last 168 hours.  HbA1C: Hgb A1c MFr Bld  Date/Time Value Ref Range Status  06/07/2017 09:31 AM 5.9 (H) 4.8 - 5.6 % Final    Comment:             Prediabetes: 5.7 - 6.4          Diabetes: >6.4          Glycemic control for adults with diabetes: <7.0     CBG: Recent Labs  Lab 05/19/22 2014  GLUCAP 130*    Review of Systems:   Review of Systems  Constitutional:  Positive for fever.  HENT:  Negative for hearing loss.   Eyes:  Negative for discharge and redness.  Respiratory:  Positive for shortness of breath. Negative for cough.   Cardiovascular:  Positive for chest pain.  Gastrointestinal:  Negative for abdominal pain, blood in stool, constipation, heartburn, melena, nausea and vomiting.  Genitourinary:  Negative for dysuria.  Musculoskeletal:  Positive for myalgias.  Skin:  Negative for rash.     Past Medical History:  She,  has a past medical history of Prolapse, disk.   Surgical History:   Past Surgical History:  Procedure Laterality  Date   NECK SURGERY       Social History:   reports that she has been smoking cigarettes. She has a 9.00 pack-year smoking history. She has never used smokeless tobacco. She reports that she does not drink alcohol and does not use drugs.   Family History:  Her family history includes Mental illness in her mother and sister.   Allergies Allergies  Allergen Reactions   Abilify [Aripiprazole] Swelling and Rash   Lamictal [Lamotrigine] Swelling and Rash   Latuda [Lurasidone] Other (See Comments)  Caused leg weakness     Home Medications  Prior to Admission medications   Medication Sig Start Date End Date Taking? Authorizing Provider  albuterol (PROVENTIL HFA) 108 (90 Base) MCG/ACT inhaler Inhale 1-2 puffs into the lungs every 6 (six) hours as needed for wheezing or shortness of breath.    [provider]  clonazePAM (KLONOPIN) 0.5 MG tablet Take one tab twice a daily and 3rd needed for severe anxiety 03/10/22   Arfeen, Arlyce Harman, MD  divalproex (DEPAKOTE ER) 250 MG 24 hr tablet Take 1 tablet (250 mg total) by mouth 2 (two) times daily. 03/10/22   Arfeen, Arlyce Harman, MD  gabapentin (NEURONTIN) 600 MG tablet Take 1 tablet (600 mg total) by mouth at bedtime. Patient not taking: Reported on 09/09/2021 03/03/21   Cloria Spring, MD  ibuprofen (ADVIL,MOTRIN) 200 MG tablet Take 800 mg by mouth 2 (two) times daily as needed (for pain).    [provider]  traZODone (DESYREL) 100 MG tablet TAKE  ONE TABLET BY MOUTH AT BEDTIME 03/10/22   Arfeen, Arlyce Harman, MD     Critical care time: 45 min

## 2022-05-19 NOTE — Progress Notes (Signed)
RT and  RN  transported pt to CT and back to pt room. No complications.

## 2022-05-19 NOTE — ED Triage Notes (Signed)
Pt BIB [facility] from home, per husband, pt has been feeling sick and taking her trazadone and benadryl at night. Tonight, husband came home from work and pt was acting normally, when he came back from bathroom pt was unresponsive on the floor. EMS reports PEA and asystole, CPR for 26 minutes, one shock given for Vtach. One epi and lidocaine.

## 2022-05-20 ENCOUNTER — Inpatient Hospital Stay (HOSPITAL_COMMUNITY): Payer: Medicaid Other

## 2022-05-20 DIAGNOSIS — I469 Cardiac arrest, cause unspecified: Secondary | ICD-10-CM

## 2022-05-20 DIAGNOSIS — R4182 Altered mental status, unspecified: Secondary | ICD-10-CM

## 2022-05-20 DIAGNOSIS — E43 Unspecified severe protein-calorie malnutrition: Secondary | ICD-10-CM | POA: Insufficient documentation

## 2022-05-20 DIAGNOSIS — L8990 Pressure ulcer of unspecified site, unspecified stage: Secondary | ICD-10-CM | POA: Insufficient documentation

## 2022-05-20 LAB — URINALYSIS, ROUTINE W REFLEX MICROSCOPIC
Bilirubin Urine: NEGATIVE
Glucose, UA: NEGATIVE mg/dL
Ketones, ur: 5 mg/dL — AB
Leukocytes,Ua: NEGATIVE
Nitrite: NEGATIVE
Protein, ur: 100 mg/dL — AB
Specific Gravity, Urine: 1.044 — ABNORMAL HIGH (ref 1.005–1.030)
pH: 6 (ref 5.0–8.0)

## 2022-05-20 LAB — POCT I-STAT 7, (LYTES, BLD GAS, ICA,H+H)
Acid-base deficit: 2 mmol/L (ref 0.0–2.0)
Bicarbonate: 25.5 mmol/L (ref 20.0–28.0)
Calcium, Ion: 1.23 mmol/L (ref 1.15–1.40)
HCT: 36 % (ref 36.0–46.0)
Hemoglobin: 12.2 g/dL (ref 12.0–15.0)
O2 Saturation: 96 %
Patient temperature: 33.2
Potassium: 4.1 mmol/L (ref 3.5–5.1)
Sodium: 134 mmol/L — ABNORMAL LOW (ref 135–145)
TCO2: 27 mmol/L (ref 22–32)
pCO2 arterial: 47.3 mmHg (ref 32–48)
pH, Arterial: 7.32 — ABNORMAL LOW (ref 7.35–7.45)
pO2, Arterial: 75 mmHg — ABNORMAL LOW (ref 83–108)

## 2022-05-20 LAB — VITAMIN B12: Vitamin B-12: 4289 pg/mL — ABNORMAL HIGH (ref 180–914)

## 2022-05-20 LAB — TROPONIN I (HIGH SENSITIVITY)
Troponin I (High Sensitivity): 153 ng/L (ref <18)
Troponin I (High Sensitivity): 172 ng/L (ref <18)

## 2022-05-20 LAB — FOLATE: Folate: 6 ng/mL (ref >5.9)

## 2022-05-20 LAB — CBC
HCT: 38.7 % (ref 36.0–46.0)
Hemoglobin: 12.6 g/dL (ref 12.0–15.0)
MCH: 30 pg (ref 26.0–34.0)
MCHC: 32.6 g/dL (ref 30.0–36.0)
MCV: 92.1 fL (ref 80.0–100.0)
Platelets: 222 10*3/uL (ref 150–400)
RBC: 4.2 MIL/uL (ref 3.87–5.11)
RDW: 13.9 % (ref 11.5–15.5)
WBC: 21.3 10*3/uL — ABNORMAL HIGH (ref 4.0–10.5)
nRBC: 0.1 % (ref 0.0–0.2)

## 2022-05-20 LAB — ECHOCARDIOGRAM COMPLETE
Area-P 1/2: 3.85 cm2
Height: 66 in
S' Lateral: 2.4 cm
Single Plane A4C EF: 49.4 %
Weight: 1763.68 oz

## 2022-05-20 LAB — LACTIC ACID, PLASMA
Lactic Acid, Venous: 3 mmol/L (ref 0.5–1.9)
Lactic Acid, Venous: 2.4 mmol/L (ref 0.5–1.9)

## 2022-05-20 LAB — RAPID URINE DRUG SCREEN, HOSP PERFORMED
Amphetamines: NOT DETECTED
Barbiturates: NOT DETECTED
Benzodiazepines: NOT DETECTED
Cocaine: NOT DETECTED
Opiates: NOT DETECTED
Tetrahydrocannabinol: POSITIVE — AB

## 2022-05-20 LAB — BASIC METABOLIC PANEL
Anion gap: 11 (ref 5–15)
BUN: 35 mg/dL — ABNORMAL HIGH (ref 6–20)
CO2: 26 mmol/L (ref 22–32)
Calcium: 8.4 mg/dL — ABNORMAL LOW (ref 8.9–10.3)
Chloride: 99 mmol/L (ref 98–111)
Creatinine, Ser: 0.85 mg/dL (ref 0.44–1.00)
GFR, Estimated: >60 mL/min (ref >60)
Glucose, Bld: 175 mg/dL — ABNORMAL HIGH (ref 70–99)
Potassium: 4 mmol/L (ref 3.5–5.1)
Sodium: 136 mmol/L (ref 135–145)

## 2022-05-20 LAB — HIV ANTIBODY (ROUTINE TESTING W REFLEX): HIV Screen 4th Generation wRfx: NONREACTIVE

## 2022-05-20 LAB — MAGNESIUM: Magnesium: 1.8 mg/dL (ref 1.7–2.4)

## 2022-05-20 LAB — STREP PNEUMONIAE URINARY ANTIGEN: Strep Pneumo Urinary Antigen: POSITIVE — AB

## 2022-05-20 LAB — HEMOGLOBIN A1C
Hgb A1c MFr Bld: 5.1 % (ref 4.8–5.6)
Mean Plasma Glucose: 99.67 mg/dL

## 2022-05-20 LAB — PHOSPHORUS
Phosphorus: 3.6 mg/dL (ref 2.5–4.6)
Phosphorus: 3.7 mg/dL (ref 2.5–4.6)

## 2022-05-20 LAB — GLUCOSE, CAPILLARY
Glucose-Capillary: 105 mg/dL — ABNORMAL HIGH (ref 70–99)
Glucose-Capillary: 93 mg/dL (ref 70–99)
Glucose-Capillary: 111 mg/dL — ABNORMAL HIGH (ref 70–99)
Glucose-Capillary: 109 mg/dL — ABNORMAL HIGH (ref 70–99)

## 2022-05-20 LAB — VALPROIC ACID LEVEL: Valproic Acid Lvl: 26 ug/mL — ABNORMAL LOW (ref 50.0–100.0)

## 2022-05-20 LAB — CK: Total CK: 4849 U/L — ABNORMAL HIGH (ref 38–234)

## 2022-05-20 LAB — MRSA NEXT GEN BY PCR, NASAL: MRSA by PCR Next Gen: NOT DETECTED

## 2022-05-20 LAB — BRAIN NATRIURETIC PEPTIDE: B Natriuretic Peptide: 91.8 pg/mL (ref 0.0–100.0)

## 2022-05-20 LAB — TSH: TSH: 0.422 u[IU]/mL (ref 0.350–4.500)

## 2022-05-20 LAB — C-REACTIVE PROTEIN: CRP: 23.2 mg/dL — ABNORMAL HIGH (ref <1.0)

## 2022-05-20 MED ORDER — ACETAMINOPHEN 160 MG/5ML PO SOLN
650.0000 mg | ORAL | Status: DC | PRN
  Administered 2022-05-24 – 2022-06-21 (×13): 650 mg
  Filled 2022-05-20 (×10): qty 20.3

## 2022-05-20 MED ORDER — ORAL CARE MOUTH RINSE
15.0000 mL | OROMUCOSAL | Status: DC
  Administered 2022-05-20 – 2022-06-02 (×158): 15 mL via OROMUCOSAL

## 2022-05-20 MED ORDER — ACETAMINOPHEN 325 MG PO TABS
650.0000 mg | ORAL_TABLET | ORAL | Status: DC | PRN
  Administered 2022-06-04 – 2022-06-23 (×3): 650 mg via ORAL
  Filled 2022-05-20 (×5): qty 2

## 2022-05-20 MED ORDER — FENTANYL CITRATE PF 50 MCG/ML IJ SOSY: 50.0000 ug | PREFILLED_SYRINGE | Freq: Once | INTRAMUSCULAR | Status: DC

## 2022-05-20 MED ORDER — ACETAMINOPHEN 650 MG RE SUPP: 650.0000 mg | RECTAL | Status: DC

## 2022-05-20 MED ORDER — SODIUM CHLORIDE 0.9 % IV SOLN
250.0000 mL | INTRAVENOUS | Status: DC
  Administered 2022-05-23: 250 mL via INTRAVENOUS

## 2022-05-20 MED ORDER — CLONAZEPAM 0.5 MG PO TABS
0.5000 mg | ORAL_TABLET | Freq: Two times a day (BID) | ORAL | Status: DC
  Administered 2022-05-20 – 2022-05-21 (×4): 0.5 mg
  Filled 2022-05-20 (×4): qty 1

## 2022-05-20 MED ORDER — POLYETHYLENE GLYCOL 3350 17 G PO PACK
17.0000 g | PACK | Freq: Every day | ORAL | Status: DC
  Administered 2022-05-20 – 2022-06-10 (×11): 17 g
  Filled 2022-05-20 (×13): qty 1

## 2022-05-20 MED ORDER — BUSPIRONE HCL 10 MG PO TABS: 30.0000 mg | ORAL_TABLET | Freq: Three times a day (TID) | ORAL | Status: AC | PRN

## 2022-05-20 MED ORDER — ORAL CARE MOUTH RINSE: 15.0000 mL | OROMUCOSAL | Status: DC | PRN

## 2022-05-20 MED ORDER — VALPROIC ACID 250 MG/5ML PO SOLN
250.0000 mg | Freq: Two times a day (BID) | ORAL | Status: DC
  Administered 2022-05-20 – 2022-06-08 (×39): 250 mg
  Filled 2022-05-20 (×40): qty 5

## 2022-05-20 MED ORDER — FOLIC ACID 1 MG PO TABS
1.0000 mg | ORAL_TABLET | Freq: Every day | ORAL | Status: DC
  Administered 2022-05-20 – 2022-06-23 (×35): 1 mg
  Filled 2022-05-20 (×35): qty 1

## 2022-05-20 MED ORDER — PROPOFOL 1000 MG/100ML IV EMUL
5.0000 ug/kg/min | INTRAVENOUS | Status: DC
  Administered 2022-05-20: 10 ug/kg/min via INTRAVENOUS
  Administered 2022-05-20: 5 ug/kg/min via INTRAVENOUS
  Administered 2022-05-21: 25 ug/kg/min via INTRAVENOUS
  Administered 2022-05-22: 66.667 ug/kg/min via INTRAVENOUS
  Administered 2022-05-22: 15 ug/kg/min via INTRAVENOUS
  Administered 2022-05-23 (×2): 30 ug/kg/min via INTRAVENOUS
  Administered 2022-05-24: 40 ug/kg/min via INTRAVENOUS
  Administered 2022-05-24: 50 ug/kg/min via INTRAVENOUS
  Filled 2022-05-20 (×10): qty 100

## 2022-05-20 MED ORDER — SODIUM CHLORIDE 0.9 % IV SOLN: 250.0000 mL | INTRAVENOUS | Status: DC

## 2022-05-20 MED ORDER — CHLORHEXIDINE GLUCONATE CLOTH 2 % EX PADS
6.0000 | MEDICATED_PAD | Freq: Every day | CUTANEOUS | Status: DC
  Administered 2022-05-20 – 2022-06-24 (×36): 6 via TOPICAL

## 2022-05-20 MED ORDER — ACETAMINOPHEN 650 MG RE SUPP: 650.0000 mg | RECTAL | Status: DC | PRN

## 2022-05-20 MED ORDER — PROPOFOL 1000 MG/100ML IV EMUL: 0.0000 ug/kg/min | INTRAVENOUS | Status: DC

## 2022-05-20 MED ORDER — MAGNESIUM SULFATE 2 GM/50ML IV SOLN: 2.0000 g | Freq: Once | INTRAVENOUS | Status: AC | PRN

## 2022-05-20 MED ORDER — NOREPINEPHRINE 4 MG/250ML-% IV SOLN
2.0000 ug/min | INTRAVENOUS | Status: DC
  Administered 2022-05-21: 2 ug/min via INTRAVENOUS
  Administered 2022-05-21: 4 ug/min via INTRAVENOUS
  Filled 2022-05-20 (×3): qty 250

## 2022-05-20 MED ORDER — INSULIN ASPART 100 UNIT/ML IJ SOLN
0.0000 [IU] | INTRAMUSCULAR | Status: DC
  Administered 2022-05-21 – 2022-05-29 (×9): 1 [IU] via SUBCUTANEOUS
  Administered 2022-05-29: 2 [IU] via SUBCUTANEOUS
  Administered 2022-05-29 – 2022-06-10 (×11): 1 [IU] via SUBCUTANEOUS

## 2022-05-20 MED ORDER — FENTANYL 2500MCG IN NS 250ML (10MCG/ML) PREMIX INFUSION
50.0000 ug/h | INTRAVENOUS | Status: DC
  Administered 2022-05-20: 100 ug/h via INTRAVENOUS
  Administered 2022-05-20 – 2022-05-21 (×2): 200 ug/h via INTRAVENOUS
  Administered 2022-05-22: 100 ug/h via INTRAVENOUS
  Administered 2022-05-22 – 2022-05-24 (×4): 200 ug/h via INTRAVENOUS
  Administered 2022-05-25: 100 ug/h via INTRAVENOUS
  Administered 2022-05-25: 150 ug/h via INTRAVENOUS
  Administered 2022-05-26: 200 ug/h via INTRAVENOUS
  Administered 2022-05-26: 175 ug/h via INTRAVENOUS
  Administered 2022-05-27 – 2022-05-28 (×3): 100 ug/h via INTRAVENOUS
  Filled 2022-05-20 (×16): qty 250

## 2022-05-20 MED ORDER — MEDIHONEY WOUND/BURN DRESSING EX PSTE
1.0000 | PASTE | Freq: Every day | CUTANEOUS | Status: DC
  Administered 2022-05-20 – 2022-06-10 (×23): 1 via TOPICAL
  Filled 2022-05-20 (×4): qty 44

## 2022-05-20 MED ORDER — ACETAMINOPHEN 325 MG PO TABS: 650.0000 mg | ORAL_TABLET | ORAL | Status: DC

## 2022-05-20 MED ORDER — STERILE WATER FOR INJECTION IV SOLN
INTRAVENOUS | Status: DC
  Filled 2022-05-20 (×2): qty 1000

## 2022-05-20 MED ORDER — ALBUMIN HUMAN 5 % IV SOLN
25.0000 g | Freq: Once | INTRAVENOUS | Status: AC
  Administered 2022-05-20: 25 g via INTRAVENOUS
  Filled 2022-05-20: qty 500

## 2022-05-20 MED ORDER — ORAL CARE MOUTH RINSE
15.0000 mL | OROMUCOSAL | Status: DC
  Administered 2022-05-20 (×2): 15 mL via OROMUCOSAL

## 2022-05-20 MED ORDER — SODIUM CHLORIDE 0.9 % IV SOLN
2.0000 g | INTRAVENOUS | Status: AC
  Administered 2022-05-20 – 2022-05-25 (×6): 2 g via INTRAVENOUS
  Filled 2022-05-20 (×6): qty 20

## 2022-05-20 MED ORDER — PANTOPRAZOLE 2 MG/ML SUSPENSION
40.0000 mg | Freq: Every day | ORAL | Status: DC
  Administered 2022-05-20 – 2022-05-26 (×7): 40 mg
  Filled 2022-05-20 (×6): qty 20

## 2022-05-20 MED ORDER — VITAL AF 1.2 CAL PO LIQD
1000.0000 mL | ORAL | Status: DC
  Administered 2022-05-20 – 2022-05-21 (×2): 1000 mL

## 2022-05-20 MED ORDER — ONDANSETRON HCL 4 MG/2ML IJ SOLN
4.0000 mg | Freq: Four times a day (QID) | INTRAMUSCULAR | Status: DC | PRN
  Administered 2022-06-17: 4 mg via INTRAVENOUS
  Filled 2022-05-20: qty 2

## 2022-05-20 MED ORDER — ORAL CARE MOUTH RINSE: 15.0000 mL | OROMUCOSAL | Status: DC

## 2022-05-20 MED ORDER — ACETAMINOPHEN 160 MG/5ML PO SOLN
650.0000 mg | ORAL | Status: DC
  Administered 2022-05-20 – 2022-05-21 (×7): 650 mg
  Filled 2022-05-20 (×6): qty 20.3

## 2022-05-20 MED ORDER — FENTANYL BOLUS VIA INFUSION
50.0000 ug | INTRAVENOUS | Status: DC | PRN
  Administered 2022-05-20 – 2022-05-25 (×9): 100 ug via INTRAVENOUS
  Administered 2022-05-25: 50 ug via INTRAVENOUS
  Administered 2022-05-25 (×2): 100 ug via INTRAVENOUS
  Administered 2022-05-25 – 2022-05-29 (×3): 50 ug via INTRAVENOUS

## 2022-05-20 MED ORDER — THIAMINE MONONITRATE 100 MG PO TABS
100.0000 mg | ORAL_TABLET | Freq: Every day | ORAL | Status: DC
  Administered 2022-05-20 – 2022-06-23 (×35): 100 mg
  Filled 2022-05-20 (×35): qty 1

## 2022-05-20 MED ORDER — DOCUSATE SODIUM 50 MG/5ML PO LIQD
100.0000 mg | Freq: Two times a day (BID) | ORAL | Status: DC
  Administered 2022-05-20 – 2022-05-30 (×16): 100 mg
  Filled 2022-05-20 (×16): qty 10

## 2022-05-20 NOTE — Progress Notes (Signed)
[facility] Progress Note Patient Name: Monica Hopkins DOB: 07-06-1971 MRN: [id number]   Date of Service  05/20/2022  HPI/Events of Note  ABG on 80%/PRVC 20/TV 470/P 5 = 7.32/47.3/75/25.5.  eICU Interventions  Plan: Increase PEEP to 8.     Intervention Category Major Interventions: Respiratory failure - evaluation and management  Addalynn Kumari Eugene 05/20/2022, 3:10 AM

## 2022-05-20 NOTE — Progress Notes (Signed)
Date and time results received: 05/20/22 0217  Test: Strep pneumoniae  Critical Value: positive  Name of Provider Notified: [facility] RN Elzie Rings  Orders Received? Or Actions Taken?: ELINK to adjust Abx coverage as needed.

## 2022-05-20 NOTE — Progress Notes (Signed)
Pt HR up to 150, EKG revealed sinus tach with RBBB.  Dr. Duwayne Heck notified.  Albumin ordered.

## 2022-05-20 NOTE — Progress Notes (Addendum)
NAME:  Monica Hopkins, MRN:  [id number], DOB:  07-12-1971, LOS: 1 ADMISSION DATE:  05/19/2022, CONSULTATION DATE:  05/19/22 REFERRING MD:  Billy Fischer, CHIEF COMPLAINT:  found down, cardiac arrest    History of Present Illness:  51 yo woman with hx of bipolar disorder,  here after cardiac arrest.   Per family has been feeling sick and taking trazodone and benadryl at night. They thought she had covid a couple of weeks ago, then recovered somewhat.  For past two weeks has been sob intermittently with some minimal cough, fevers, sore throat.  Her husband came home and she was alert and talking, he went to the bathroom.  10- min later he came back to find her unconscious.  He started CPR immediately, EMS came fairly quickly.  She was reportedly found to be in PEA.  Given 1 mg epi, also lidocaine.  Found once to be in shockable rhythm, shocked once.  Rosc after about 26 min, while in route.   Per family has undergone large weight loss  a few years ago, unclear cause.  Patient had felt it was from lithium but unclear when last takng this..  Poor po intake from low appeitite, nausea and vomiting.  She apparently has not wanted to go to visit the dr.  She recently has only done phone visits with psychiatry.   Sleeps sitting up in recliner.   Per her mother, she has not felt well in years, has complained of some virus or other acute illness affecting her for the past year.   Intubated in ED ( Had king airway in place) at 820 per drug record.  In ED hypothermic  ABG 7.206/49/82 Cefepime and vanc ordered.   AKI cr 1.2 from 0.79 WBC 14.3   Home meds klonipin, trazodone depakote.   Pertinent  Medical History  Bipolar disorder Unexplained weight loss  Poor dentition.  Significant Hospital Events: Including procedures, antibiotic start and stop dates in addition to other pertinent events   10/17: admitted post cardiac arrest; ROSC 26 minutes  Interim History / Subjective:  Intubated on mech  vent   Objective   Blood pressure (!) 155/140, pulse (!) 121, temperature (!) 97.2 F (36.2 C), resp. rate 19, height 5\' 6"  (1.676 m), weight 50 kg, SpO2 100 %.    Vent Mode: PRVC FiO2 (%):  [80 %-100 %] 80 % Set Rate:  [15 bmp-20 bmp] 20 bmp Vt Set:  [470 mL] 470 mL PEEP:  [5 cmH20-8 cmH20] 8 cmH20 Plateau Pressure:  [15 cmH20-22 cmH20] 21 cmH20   Intake/Output Summary (Last 24 hours) at 05/20/2022 1049 Last data filed at 05/20/2022 1000 Gross per 24 hour  Intake 681.27 ml  Output 345 ml  Net 336.27 ml   Filed Weights   05/19/22 2000 05/19/22 2112 05/20/22 0600  Weight: 61 kg 50 kg 50 kg    Examination: General:  cachectic appearing female; critically ill appearing on mech vent HEENT: MM pink/moist; ETT in place; poor dentition Neuro: MAE but non purposeful mvt; not following commands; PERRL CV: s1s2, tachy 130s, no m/r/g PULM:  dim clear BS bilaterally; on mech vent PRVC GI: soft, bsx4 active  Extremities: warm/dry, no edema  Skin: no rashes or lesions appreciated  Resolved Hospital Problem list    Assessment & Plan:  Cardiac arrest: found to be in PEA.  Given 1 mg epi, also lidocaine.  Found once to be in shockable rhythm, shocked once.  Rosc after about 26 min, while in route.  P: -  telemetry monitoring -off pressors -echo pending -electrolytes normal this am -TTM protocol  Acute respiratory failure Strep pneumonia positive P: -LTVV strategy with tidal volumes of 6-8 cc/kg ideal body weight -Wean PEEP/FiO2 for SpO2 >92% -VAP bundle in place -Daily SAT and SBT -PAD protocol in place -wean sedation for RASS goal 0 to -1 -Follow intermittent CXR and ABG PRN -rocephin for strep pneumonia -follow cultures -trend wbc/fever curve  Acute encephalopathy -UDS positive for THC; family states she took suboxone 10/17 P: -patient awake MAE but not following commands -limit sedating meds -wean sedation for RASS 0  -EEG  pending  Hematuria Hyperkalemia AKI P: -check ck and start on bicarb -K and creat improved -trend bmp -Trend BMP / urinary output -Replace electrolytes as indicated -Avoid nephrotoxic agents, ensure adequate renal perfusion  Severe protein calorie malnutrition P: -diet consulted -trickle TF and advance slowly to avoid refeeding -place cor trak  Anxiety depression  P: -resume depakote; check level -hold trazodone -resume home klonopin to avoid withdrawal   Best Practice (right click and "Reselect all SmartList Selections" daily)   Diet/type: tubefeeds and NPO w/ meds via tube DVT prophylaxis: heparin subq GI prophylaxis: PPI Lines: Arterial Line Foley:  yes Code Status:  full code Last date of multidisciplinary goals of care discussion [10/18 spoke with patient's husband at bedside and updated]  Labs   CBC: Recent Labs  Lab 05/19/22 2020 05/19/22 2037 05/19/22 2107 05/20/22 0243 05/20/22 0359  WBC 26.1*  --   --   --  21.3*  NEUTROABS 22.4*  --   --   --   --   HGB 11.8* 12.6  12.9 10.9* 12.2 12.6  HCT 36.5 37.0  38.0 32.0* 36.0 38.7  MCV 97.3  --   --   --  92.1  PLT 237  --   --   --  222     Basic Metabolic Panel: Recent Labs  Lab 05/19/22 2020 05/19/22 2037 05/19/22 2107 05/20/22 0243 05/20/22 0359  NA 138 132*  133* 134* 134* 136  K 5.5* 5.3*  5.3* 5.2* 4.1 4.0  CL 96* 99  --   --  99  CO2 20*  --   --   --  26  GLUCOSE 144* 138*  --   --  175*  BUN 32* 38*  --   --  35*  CREATININE 1.05* 1.20*  --   --  0.85  CALCIUM 9.4  --   --   --  8.4*  MG 2.4  --   --   --   --     GFR: Estimated Creatinine Clearance: 61.8 mL/min (by C-G formula based on SCr of 0.85 mg/dL). Recent Labs  Lab 05/19/22 2020 05/19/22 2051 05/20/22 0104 05/20/22 0359  PROCALCITON 0.97  --   --   --   WBC 26.1*  --   --  21.3*  LATICACIDVEN  --  >9.0* 3.0* 2.4*     Liver Function Tests: Recent Labs  Lab 05/19/22 2020  AST 435*  ALT 132*  ALKPHOS 83   BILITOT 1.6*  PROT 4.6*  ALBUMIN 1.8*    Recent Labs  Lab 05/19/22 2020  LIPASE 26    No results for input(s): "AMMONIA" in the last 168 hours.  ABG    Component Value Date/Time   PHART 7.320 (L) 05/20/2022 0243   PCO2ART 47.3 05/20/2022 0243   PO2ART 75 (L) 05/20/2022 0243   HCO3 25.5 05/20/2022 0243   TCO2 27  05/20/2022 0243   ACIDBASEDEF 2.0 05/20/2022 0243   O2SAT 96 05/20/2022 0243     Coagulation Profile: No results for input(s): "INR", "PROTIME" in the last 168 hours.  Cardiac Enzymes: No results for input(s): "CKTOTAL", "CKMB", "CKMBINDEX", "TROPONINI" in the last 168 hours.  HbA1C: Hgb A1c MFr Bld  Date/Time Value Ref Range Status  06/07/2017 09:31 AM 5.9 (H) 4.8 - 5.6 % Final    Comment:             Prediabetes: 5.7 - 6.4          Diabetes: >6.4          Glycemic control for adults with diabetes: <7.0     CBG: Recent Labs  Lab 05/19/22 2014  GLUCAP 130*     Review of Systems:     Past Medical History:  She,  has a past medical history of Prolapse, disk.   Surgical History:   Past Surgical History:  Procedure Laterality Date   NECK SURGERY       Social History:   reports that she has been smoking cigarettes. She has a 9.00 pack-year smoking history. She has never used smokeless tobacco. She reports that she does not drink alcohol and does not use drugs.   Family History:  Her family history includes Mental illness in her mother and sister.   Allergies Allergies  Allergen Reactions   Abilify [Aripiprazole] Swelling and Rash   Lamictal [Lamotrigine] Swelling and Rash   Latuda [Lurasidone] Other (See Comments)    Caused leg weakness     Home Medications  Prior to Admission medications   Medication Sig Start Date End Date Taking? Authorizing Provider  albuterol (PROVENTIL HFA) 108 (90 Base) MCG/ACT inhaler Inhale 1-2 puffs into the lungs every 6 (six) hours as needed for wheezing or shortness of breath.    [provider]  clonazePAM (KLONOPIN) 0.5 MG tablet Take one tab twice a daily and 3rd needed for severe anxiety 03/10/22   Arfeen, Phillips Grout, MD  divalproex (DEPAKOTE ER) 250 MG 24 hr tablet Take 1 tablet (250 mg total) by mouth 2 (two) times daily. 03/10/22   Arfeen, Phillips Grout, MD  gabapentin (NEURONTIN) 600 MG tablet Take 1 tablet (600 mg total) by mouth at bedtime. Patient not taking: Reported on 09/09/2021 03/03/21   Myrlene Broker, MD  ibuprofen (ADVIL,MOTRIN) 200 MG tablet Take 800 mg by mouth 2 (two) times daily as needed (for pain).    [provider]  traZODone (DESYREL) 100 MG tablet TAKE  ONE TABLET BY MOUTH AT BEDTIME 03/10/22   Arfeen, Phillips Grout, MD     Critical care time: 35 min    JD Anselm Lis [facility] 05/20/2022, 10:50 AM  Please see [email or web address] for pager details.  From 7A-7P if no response, please call [phone]. After hours, please call ELink [phone].

## 2022-05-20 NOTE — Procedures (Signed)
Cortrak  Person Inserting Tube:  Dennice Tindol C, RD Tube Type:  Cortrak - 43 inches Tube Size:  10 Tube Location:  Left nare Secured by: Bridle Technique Used to Measure Tube Placement:  Marking at nare/corner of mouth Cortrak Secured At:  76 cm   Cortrak Tube Team Note:  Consult received to place a Cortrak feeding tube.   X-ray is required, abdominal x-ray has been ordered by the Cortrak team. Please confirm tube placement before using the Cortrak tube.   If the tube becomes dislodged please keep the tube and contact the Cortrak team at [email or web address] for replacement.  If after hours and replacement cannot be delayed, place a NG tube and confirm placement with an abdominal x-ray.    Akeema Broder P., RD, LDN, CNSC See AMiON for contact information    

## 2022-05-20 NOTE — Progress Notes (Signed)
RT and RN transported pt from ED to [facility]. No complication.

## 2022-05-20 NOTE — Procedures (Signed)
Patient Name: Monica Hopkins  MRN: [id number]  Epilepsy Attending: Lora Havens  Referring Physician/Provider: Collier Bullock, MD  Date: 05/20/2022 Duration: 26.26 mins  Patient history: 51 year old female status post cardiac arrest.  EEG to evaluate for seizure.  Level of alertness: comatose  AEDs during EEG study: Propofol, Depakote, clonazepam  Technical aspects: This EEG study was done with scalp electrodes positioned according to the 10-20 International system of electrode placement. Electrical activity was reviewed with band pass filter of 1-70Hz , sensitivity of 7 uV/mm, display speed of 42mm/sec with a 60Hz  notched filter applied as appropriate. EEG data were recorded continuously and digitally stored.  Video monitoring was available and reviewed as appropriate.  Description: EEG showed continuous generalized low amplitude 3 to 5 Hz theta- delta slowing. Hyperventilation and photic stimulation were not performed.     ABNORMALITY - Continuous slow, generalized  IMPRESSION: This study is suggestive of severe diffuse encephalopathy, nonspecific etiology. No seizures or epileptiform discharges were seen throughout the recording.  Harsimran Westman Barbra Sarks

## 2022-05-20 NOTE — Progress Notes (Signed)
   05/19/22 2352  Clinical Encounter Type  Visited With Family  Visit Type Spiritual support;Follow-up  Referral From Nurse  Consult/Referral To Chaplain   [facility] responded to request to escort family to [facility] waiting area. Critical care MD providing medical update in consult rm A. The son requested to spend the night in the patient's room and the father also said he would stay tonight. MD and [place] advised one can stay in room and family welcome to remain in waiting area. Once family arrived on [place],  [place] provided hospitality of vending water for the patient's husband and snack for grandson. The patient's husband and son continued discussion of note leaving the patient tonight, [place] advised that [place] cannot make the decision, advised the charge nurse will inform them of the unit's protocol. Advised family [place] remain available for additional support as needed. This note was prepared by Jeanine Luz, M.Div..  For questions please contact by phone [phone].

## 2022-05-20 NOTE — Progress Notes (Signed)
Echocardiogram 2D Echocardiogram has been performed.  [place] Deputy Randie Bloodgood RDCS 05/20/2022, 10:25 AM

## 2022-05-20 NOTE — Progress Notes (Signed)
An USGPIV (ultrasound guided PIV) has been placed for short-term vasopressor infusion. A correctly placed ivWatch must be used when administering Vasopressors. Should this treatment be needed beyond 72 hours, central line access should be obtained.  It will be the responsibility of the bedside nurse to follow best practice to prevent extravasations.   ?

## 2022-05-20 NOTE — Procedures (Signed)
Arterial Catheter Insertion Procedure Note  Monica Hopkins  [id number]  1971-03-31  Date:05/20/22  Time:11:19 AM    Provider Performing: Collier Bullock    Procedure: Insertion of Arterial Line [phone]) with US guidance (02585)   Indication(s) Blood pressure monitoring and/or need for frequent ABGs  Consent Unable to obtain consent due to emergent nature of procedure.  Anesthesia  Lidocaine 1%  Time Out Verified patient identification, verified procedure, site/side was marked, verified correct patient position, special equipment/implants available, medications/allergies/relevant history reviewed, required imaging and test results available.   Sterile Technique Maximal sterile technique including full sterile barrier drape, hand hygiene, sterile gown, sterile gloves, mask, hair covering, sterile ultrasound probe cover (if used).   Procedure Description Area of catheter insertion was cleaned with chlorhexidine and draped in sterile fashion. With real-time ultrasound guidance an arterial catheter was placed into the right femoral artery.  Appropriate arterial tracings confirmed on monitor.     Complications/Tolerance None; patient tolerated the procedure well.   EBL Minimal   Specimen(s) None

## 2022-05-20 NOTE — Progress Notes (Signed)
eLink Physician-Brief Progress Note Patient Name: Monica Hopkins DOB: Mar 19, 1971 MRN: [id number]   Date of Service  05/20/2022  HPI/Events of Note  Strep pneumonia urine antigen is positive. Patient should be well covered by her current antibiotics which are Vancomycin and Cefepime.   eICU Interventions  Continue present management.      Intervention Category Major Interventions: Infection - evaluation and management  Essica Kiker [place] 05/20/2022, 2:47 AM

## 2022-05-20 NOTE — ED Provider Notes (Signed)
[facility] Provider Note   CSN: [id number] Arrival date & time: 05/19/22  2013     History  Chief Complaint  Patient presents with   Cardiac Arrest    Monica Hopkins is a 51 y.o. female.  HPI     51yo female with history of bipolar type I, anxiety, presents with concern for cardiac arrest.  Family reports she has chronic fatigue, occasional vomiting however has been sick and everyone has been sick at home over the last week.  Has had cough, congestion, severe fatigue, some nausea/vomiting, and fever.  Fever was higher 2 days ago, the last few days she has had fatigue.  Husband reports she would be coherent in the morning and as the day went on she would seem more confused and delirious towards nightime. Has been taking trazodone and benadryl for her symptoms of feeling ill and fever at night.  She has had shortness of breath.    He had seen her and within 10 minutes went to check on her and found her unresponsive on the floor. Called EMS and began bystander CPR.  On EMS arrival, she was found in PEA arrest.  They placed a king airway, gave epinephrine 1mg , 100mg  lidocaine over 25 minutes of CPR per their protocol and had ROSC prior to arrival.  Past Medical History:  Diagnosis Date   Prolapse, disk      Home Medications Prior to Admission medications   Medication Sig Start Date End Date Taking? Authorizing Provider  acetaminophen (TYLENOL) 500 MG tablet Take 1,000 mg by mouth every 6 (six) hours as needed for mild pain.   Yes [provider]  albuterol (PROVENTIL HFA) 108 (90 Base) MCG/ACT inhaler Inhale 1-2 puffs into the lungs every 6 (six) hours as needed for wheezing or shortness of breath.   Yes [provider]  Buprenorphine HCl-Naloxone HCl (SUBOXONE) 8-2 MG FILM Place 0.25-0.5 Film under the tongue daily.   Yes [provider]  clonazePAM (KLONOPIN) 0.5 MG tablet Take one tab twice a daily and 3rd needed for severe  anxiety Patient taking differently: Take 0.5 mg by mouth 2 (two) times daily as needed for anxiety. May take 1 additional tablet if needed for anxiety. 03/10/22  Yes Arfeen, , MD  diphenhydrAMINE (BENADRYL) 25 MG tablet Take 25 mg by mouth every 6 (six) hours as needed for allergies (congestion).   Yes [provider]  divalproex (DEPAKOTE ER) 250 MG 24 hr tablet Take 1 tablet (250 mg total) by mouth 2 (two) times daily. 03/10/22  Yes Arfeen, Phillips Grout, MD  ibuprofen (ADVIL,MOTRIN) 200 MG tablet Take 800 mg by mouth 2 (two) times daily as needed (for pain).   Yes [provider]  traZODone (DESYREL) 100 MG tablet TAKE  ONE TABLET BY MOUTH AT BEDTIME Patient taking differently: Take 100 mg by mouth at bedtime. 03/10/22  Yes Arfeen, Phillips Grout, MD      Allergies    Abilify [aripiprazole], Lamictal [lamotrigine], and Latuda [lurasidone]    Review of Systems   Review of Systems  Physical Exam Updated Vital Signs BP (!) 155/140   Pulse (!) 121   Temp (!) 97.2 F (36.2 C)   Resp 19   Ht 5\' 6"  (1.676 m)   Wt 50 kg   SpO2 100%   BMI 17.79 kg/m  Physical Exam Vitals and nursing note reviewed.  Constitutional:      Appearance: She is cachectic. She is ill-appearing. She is not  diaphoretic.     Comments: unresponsive  HENT:     Head: Normocephalic and atraumatic.  Eyes:     Conjunctiva/sclera: Conjunctivae normal.     Comments: 80mm bilateral pupils, nonreactive  Cardiovascular:     Rate and Rhythm: Regular rhythm. Tachycardia present.     Heart sounds: Normal heart sounds. No murmur heard.    No friction rub. No gallop.  Pulmonary:     Effort: Pulmonary effort is normal. No respiratory distress.     Breath sounds: Rhonchi present. No wheezing.  Abdominal:     General: There is no distension.     Palpations: Abdomen is soft.     Tenderness: There is no abdominal tenderness. There is no guarding.  Musculoskeletal:        General: No tenderness.     Cervical back:  Normal range of motion.  Skin:    General: Skin is warm and dry.     Findings: No erythema or rash.  Neurological:     Mental Status: She is unresponsive.     GCS: GCS eye subscore is 1. GCS verbal subscore is 1. GCS motor subscore is 1.     ED Results / Procedures / Treatments   Labs (all labs ordered are listed, but only abnormal results are displayed) Labs Reviewed  LACTIC ACID, PLASMA - Abnormal; Notable for the following components:      Result Value   Lactic Acid, Venous >9.0 (*)    All other components within normal limits  CBC WITH DIFFERENTIAL/PLATELET - Abnormal; Notable for the following components:   WBC 26.1 (*)    RBC 3.75 (*)    Hemoglobin 11.8 (*)    Neutro Abs 22.4 (*)    Abs Immature Granulocytes 1.04 (*)    All other components within normal limits  COMPREHENSIVE METABOLIC PANEL - Abnormal; Notable for the following components:   Potassium 5.5 (*)    Chloride 96 (*)    CO2 20 (*)    Glucose, Bld 144 (*)    BUN 32 (*)    Creatinine, Ser 1.05 (*)    Total Protein 4.6 (*)    Albumin 1.8 (*)    AST 435 (*)    ALT 132 (*)    Total Bilirubin 1.6 (*)    Anion gap 22 (*)    All other components within normal limits  LITHIUM LEVEL - Abnormal; Notable for the following components:   Lithium Lvl <0.06 (*)    All other components within normal limits  ACETAMINOPHEN LEVEL - Abnormal; Notable for the following components:   Acetaminophen (Tylenol), Serum <10 (*)    All other components within normal limits  URINALYSIS, ROUTINE W REFLEX MICROSCOPIC - Abnormal; Notable for the following components:   Color, Urine AMBER (*)    APPearance HAZY (*)    Specific Gravity, Urine 1.044 (*)    Hgb urine dipstick LARGE (*)    Ketones, ur 5 (*)    Protein, ur 100 (*)    Bacteria, UA FEW (*)    Non Squamous Epithelial 0-5 (*)    All other components within normal limits  RAPID URINE DRUG SCREEN, HOSP PERFORMED - Abnormal; Notable for the following components:    Tetrahydrocannabinol POSITIVE (*)    All other components within normal limits  STREP PNEUMONIAE URINARY ANTIGEN - Abnormal; Notable for the following components:   Strep Pneumo Urinary Antigen POSITIVE (*)    All other components within normal limits  LACTIC ACID, PLASMA -  Abnormal; Notable for the following components:   Lactic Acid, Venous 3.0 (*)    All other components within normal limits  LACTIC ACID, PLASMA - Abnormal; Notable for the following components:   Lactic Acid, Venous 2.4 (*)    All other components within normal limits  CBC - Abnormal; Notable for the following components:   WBC 21.3 (*)    All other components within normal limits  BASIC METABOLIC PANEL - Abnormal; Notable for the following components:   Glucose, Bld 175 (*)    BUN 35 (*)    Calcium 8.4 (*)    All other components within normal limits  CBG MONITORING, ED - Abnormal; Notable for the following components:   Glucose-Capillary 130 (*)    All other components within normal limits  I-STAT CHEM 8, ED - Abnormal; Notable for the following components:   Sodium 133 (*)    Potassium 5.3 (*)    BUN 38 (*)    Creatinine, Ser 1.20 (*)    Glucose, Bld 138 (*)    All other components within normal limits  I-STAT VENOUS BLOOD GAS, ED - Abnormal; Notable for the following components:   pH, Ven 7.156 (*)    pO2, Ven 138 (*)    Acid-base deficit 9.0 (*)    Sodium 132 (*)    Potassium 5.3 (*)    All other components within normal limits  I-STAT ARTERIAL BLOOD GAS, ED - Abnormal; Notable for the following components:   pH, Arterial 7.206 (*)    pCO2 arterial 49.1 (*)    pO2, Arterial 82 (*)    Acid-base deficit 8.0 (*)    Sodium 134 (*)    Potassium 5.2 (*)    HCT 32.0 (*)    Hemoglobin 10.9 (*)    All other components within normal limits  POCT I-STAT 7, (LYTES, BLD GAS, ICA,H+H) - Abnormal; Notable for the following components:   pH, Arterial 7.320 (*)    pO2, Arterial 75 (*)    Sodium 134 (*)    All  other components within normal limits  TROPONIN I (HIGH SENSITIVITY) - Abnormal; Notable for the following components:   Troponin I (High Sensitivity) 129 (*)    All other components within normal limits  TROPONIN I (HIGH SENSITIVITY) - Abnormal; Notable for the following components:   Troponin I (High Sensitivity) 153 (*)    All other components within normal limits  TROPONIN I (HIGH SENSITIVITY) - Abnormal; Notable for the following components:   Troponin I (High Sensitivity) 172 (*)    All other components within normal limits  CULTURE, BLOOD (ROUTINE X 2)  RESP PANEL BY RT-PCR (FLU A&B, COVID) ARPGX2  MRSA NEXT GEN BY PCR, NASAL  BRAIN NATRIURETIC PEPTIDE  SALICYLATE LEVEL  MAGNESIUM  PROCALCITONIN  LIPASE, BLOOD  HIV ANTIBODY (ROUTINE TESTING W REFLEX)  TSH  LEGIONELLA PNEUMOPHILA SEROGP 1 UR AG  BLOOD GAS, ARTERIAL  VALPROIC ACID LEVEL    EKG EKG Interpretation  Date/Time:  Tuesday May 19 2022 20:31:01 EDT Ventricular Rate:  118 PR Interval:  144 QRS Duration: 134 QT Interval:  357 QTC Calculation: 501 R Axis:   -83 Text Interpretation: Sinus tachycardia IVCD, consider atypical RBBB Consider inferior infarct No significant change since last tracing Confirmed by Alvira MondaySchlossman, Andren Bethea ([id number]) on 05/19/2022 9:21:38 PM  Radiology US Abdomen Complete  Result Date: 05/20/2022 CLINICAL DATA:  Sepsis EXAM: ABDOMEN ULTRASOUND COMPLETE COMPARISON:  None Available. FINDINGS: Gallbladder: Absent Common bile duct: Diameter: 4-5 mm in  proximal diameter Liver: No focal lesion identified. Within normal limits in parenchymal echogenicity. Portal vein is patent on color Doppler imaging with normal direction of blood flow towards the liver. IVC: No abnormality visualized. Pancreas: Visualized portion unremarkable. Spleen: Not visualized on the presented images Right Kidney: Length: 11.9 cm. Echogenicity within normal limits. No mass or hydronephrosis visualized. Trace right perinephric  edema. Left Kidney: Length: 9.6 cm. Echogenicity within normal limits. No mass or hydronephrosis visualized. Abdominal aorta: No aneurysm visualized. Other findings: Trace perihepatic ascites IMPRESSION: 1. Trace perihepatic ascites. 2. Trace right perinephric edema. 3. Status post cholecystectomy. 4. Nonvisualization of the spleen, likely related to its relatively high position and overlying osseous and enteric structures noted on CT examination of 05/19/2022. Electronically Signed   By: Helyn Numbers M.D.   On: 05/20/2022 01:51   CT Angio Chest PE W and/or Wo Contrast  Result Date: 05/19/2022 CLINICAL DATA:  Altered mental status.  Status post cardiac arrest. EXAM: CT ANGIOGRAPHY CHEST WITH CONTRAST TECHNIQUE: Multidetector CT imaging of the chest was performed using the standard protocol during bolus administration of intravenous contrast. Multiplanar CT image reconstructions and MIPs were obtained to evaluate the vascular anatomy. RADIATION DOSE REDUCTION: This exam was performed according to the departmental dose-optimization program which includes automated exposure control, adjustment of the mA and/or kV according to patient size and/or use of iterative reconstruction technique. CONTRAST:  75mL OMNIPAQUE IOHEXOL 350 MG/ML SOLN COMPARISON:  12/05/2019 FINDINGS: Cardiovascular: No filling defects in the pulmonary arteries to suggest pulmonary emboli. Heart is normal size. Aorta is normal caliber. Mediastinum/Nodes: No mediastinal, hilar, or axillary adenopathy. Trachea and esophagus are unremarkable. Thyroid unremarkable. Endotracheal tube within the trachea. Lungs/Pleura: Bilateral lower lobe airspace disease as well as lingular airspace disease concerning for pneumonia. Aspiration not excluded. No effusions or pneumothorax. Upper Abdomen: No acute findings.  NG tube in the stomach. Musculoskeletal: Fracture through the left anterior 2nd and 6th ribs. Chest wall soft tissues unremarkable. Review of the  MIP images confirms the above findings. IMPRESSION: No evidence of pulmonary embolus. Extensive bilateral lower lobe airspace opacities and lingular airspace disease. Findings most compatible with pneumonia. Aspiration not excluded. Anterior left 2nd and 6th rib fractures.  No pneumothorax. Electronically Signed   By: Charlett Nose M.D.   On: 05/19/2022 22:40   CT Head Wo Contrast  Result Date: 05/19/2022 CLINICAL DATA:  Altered mental status, nontraumatic EXAM: CT HEAD WITHOUT CONTRAST TECHNIQUE: Contiguous axial images were obtained from the base of the skull through the vertex without intravenous contrast. RADIATION DOSE REDUCTION: This exam was performed according to the departmental dose-optimization program which includes automated exposure control, adjustment of the mA and/or kV according to patient size and/or use of iterative reconstruction technique. COMPARISON:  12/05/2019 FINDINGS: Brain: No acute intracranial abnormality. Specifically, no hemorrhage, hydrocephalus, mass lesion, acute infarction, or significant intracranial injury. Vascular: No hyperdense vessel or unexpected calcification. Skull: No acute calvarial abnormality. Sinuses/Orbits: No acute findings Other: None IMPRESSION: No acute intracranial abnormality. Electronically Signed   By: Charlett Nose M.D.   On: 05/19/2022 22:35   DG Chest Portable 1 View  Result Date: 05/19/2022 CLINICAL DATA:  Post intubation EXAM: PORTABLE CHEST 1 VIEW COMPARISON:  Radiographs 08/06/2018 FINDINGS: Endotracheal tube in the intrathoracic trachea approximately 5 cm from the carina. Enteric tube tip in the stomach with side port near the GE junction. Defibrillator pads. Diagonal line projecting over the lateral right upper chest with some pulmonary markings extending beyond the line. This is presumably a  skin fold however pneumothorax is difficult to exclude. Further evaluation with left lateral decubitus or CT may be helpful. No evidence of mediastinal  shift. Hazy airspace and interstitial opacities in the left mid and bilateral lower lungs. Normal cardiomediastinal silhouette. No definite pleural effusion. No definite rib fractures. IMPRESSION: Line projecting over the right superolateral thorax may be a skin fold however if there is concern for pneumothorax further evaluation with upright or left lateral decubitus radiographs is recommended. Hazy airspace and interstitial opacities in the mid and lower lungs are nonspecific and may be due to edema or infection. Endotracheal tube in good position. Enteric tube tip in the stomach with side port near the GE junction. Recommend advancement 6-7 cm. Electronically Signed   By: Minerva Fester M.D.   On: 05/19/2022 20:54    Procedures .Critical Care  Performed by: Alvira Monday, MD Authorized by: Alvira Monday, MD   Critical care provider statement:    Critical care time (minutes):  75   Critical care was time spent personally by me on the following activities:  Development of treatment plan with patient or surrogate, discussions with consultants, evaluation of patient's response to treatment, examination of patient, ordering and review of laboratory studies, ordering and review of radiographic studies, ordering and performing treatments and interventions, pulse oximetry, re-evaluation of patient's condition and review of old charts Procedure Name: Intubation Date/Time: 05/20/2022 10:43 AM  Performed by: Alvira Monday, MDPre-anesthesia Checklist: Patient identified, Patient being monitored, Emergency Drugs available, Timeout performed and Suction available Oxygen Delivery Method: Non-rebreather mask Preoxygenation: Pre-oxygenation with 100% oxygen Induction Type: Rapid sequence Ventilation: Mask ventilation without difficulty Laryngoscope Size: Glidescope Tube size: 7.0 mm Number of attempts: 1 Placement Confirmation: ETT inserted through vocal cords under direct vision, CO2 detector and  Breath sounds checked- equal and bilateral Secured at: 25 cm Tube secured with: ETT holder        Medications Ordered in ED Medications  polyethylene glycol (MIRALAX / GLYCOLAX) packet 17 g (has no administration in time range)  heparin injection 5,000 Units (5,000 Units Subcutaneous Given 05/20/22 0507)  docusate (COLACE) 50 MG/5ML liquid 100 mg (has no administration in time range)  lidocaine (PF) (XYLOCAINE) 1 % injection (has no administration in time range)  pantoprazole (PROTONIX) 2 mg/mL oral suspension 40 mg (has no administration in time range)  docusate (COLACE) 50 MG/5ML liquid 100 mg (100 mg Per Tube Given 05/20/22 1033)  polyethylene glycol (MIRALAX / GLYCOLAX) packet 17 g (17 g Per Tube Given 05/20/22 1033)  ondansetron (ZOFRAN) injection 4 mg (has no administration in time range)  acetaminophen (TYLENOL) tablet 650 mg ( Oral See Alternative 05/20/22 1033)    Or  acetaminophen (TYLENOL) 160 MG/5ML solution 650 mg (650 mg Per Tube Given 05/20/22 1033)    Or  acetaminophen (TYLENOL) suppository 650 mg ( Rectal See Alternative 05/20/22 1033)  acetaminophen (TYLENOL) tablet 650 mg (has no administration in time range)    Or  acetaminophen (TYLENOL) 160 MG/5ML solution 650 mg (has no administration in time range)    Or  acetaminophen (TYLENOL) suppository 650 mg (has no administration in time range)  busPIRone (BUSPAR) tablet 30 mg (has no administration in time range)    Or  busPIRone (BUSPAR) tablet 30 mg (has no administration in time range)  magnesium sulfate IVPB 2 g 50 mL (has no administration in time range)  0.9 %  sodium chloride infusion (has no administration in time range)  fentaNYL (SUBLIMAZE) injection 50 mcg (50 mcg Intravenous Not  Given 05/20/22 0323)  fentaNYL in NS (28mcg/ml) infusion-PREMIX (100 mcg/hr Intravenous Infusion Verify 05/20/22 1000)  fentaNYL (SUBLIMAZE) bolus via infusion 50-100 mcg (has no administration in time range)  Oral  care mouth rinse (has no administration in time range)  propofol (DIPRIVAN) 1000 MG/100ML infusion (0 mcg/kg/min  50 kg Intravenous Stopped 05/20/22 0819)  Chlorhexidine Gluconate Cloth 2 % PADS 6 each (6 each Topical Given 05/20/22 0817)  leptospermum manuka honey (MEDIHONEY) paste 1 Application (has no administration in time range)  cefTRIAXone (ROCEPHIN) 2 g in sodium chloride 0.9 % 100 mL IVPB (has no administration in time range)  valproic acid (DEPAKENE) 250 MG/5ML solution 250 mg (has no administration in time range)  lactated ringers bolus 1,000 mL (0 mLs Intravenous Stopped 05/19/22 2318)  ceFEPIme (MAXIPIME) 2 g in sodium chloride 0.9 % 100 mL IVPB (0 g Intravenous Stopped 05/19/22 2208)  etomidate (AMIDATE) injection (10 mg Intravenous Given 05/19/22 2021)  rocuronium (ZEMURON) injection (80 mg Intravenous Given 05/19/22 2022)  vancomycin (VANCOREADY) IVPB 1250 mg/250 mL (1,250 mg Intravenous New Bag/Given 05/19/22 2346)  lactated ringers bolus 1,000 mL (1,000 mLs Intravenous New Bag/Given 05/19/22 2346)  iohexol (OMNIPAQUE) 350 MG/ML injection 75 mL (75 mLs Intravenous Contrast Given 05/19/22 2232)  norepinephrine (LEVOPHED) 4-5 MG/250ML-% infusion SOLN (2 mcg/min  New Bag/Given 05/19/22 2306)  albumin human 5 % solution 25 g (0 g Intravenous Stopped 05/20/22 0740)    ED Course/ Medical Decision Making/ A&P                            51yo female with history of bipolar type I, anxiety, presents with concern for cardiac arrest.  On arrival to the ED, is post-arrest and maintaining blood pressures low 100s, GCS 3T with saturations waxing and waning from low 90s to 85 with king airway in place.   Intubated for hypoxia and airway protection. Tube advanced 1cm following XR.  Noted opacities on XR, ordered vanc/cefepime. EKG evaluated by me showing sinus tachycardia and RBBB.   Updated family regarding care.  Istat labs resulted with mild hyperkalemia, mild Cr elevation,  respiratory acidosis-vent settings changed with improvement.  Discussed with Critical Care Dr. Marcos Eke.  CT head, CT PE study pending as well as labs. Labs later reviewed showing elevated troponin, likely in setting of cardiac arrest, will continue to monitor.  Suspect cardiac arrest secondary to hypoxia, secondary to pneumonia r/o PE/COVID.  Admitted with CT pending.            Final Clinical Impression(s) / ED Diagnoses Final diagnoses:  Cardiac arrest (HCC)  Pneumonia of both lungs due to infectious organism, unspecified part of lung  Lactic acidosis  Troponin level elevated    Rx / DC Orders ED Discharge Orders     None         Alvira Monday, MD 05/20/22 1048

## 2022-05-20 NOTE — Progress Notes (Signed)
Initial Nutrition Assessment  DOCUMENTATION CODES:   Severe malnutrition in context of social or environmental circumstances, Underweight  INTERVENTION:  Initiate trickle tube feeds via Cortrak tube today: -Vital AF 1.2 at 15 mL/hour  Once appropriate for advancement pending electrolytes and tolerance, advance to goal regimen via Cortrak: -Advance by 10 mL/hour every 8 hours to goal rate of Vital AF 1.2 at 55 mL/hour x 24 hours (1320 mL goal daily volume). -Goal regimen provides 1584 kcal, 99 grams of protein, 1069 mL H2O daily.  Ordered baseline phosphorus level. Monitor magnesium, potassium, and phosphorus BID for at least 3 days, MD to replete as needed, as pt is at risk for refeeding syndrome.   Start thiamine 169 mg daily and folic acid 1 mg daily.  Ordered micronutrient labs as patient is at risk of deficiency: serum copper, zinc, folate, vitamin A, vitamin B12, vitamin C, CRP.  NUTRITION DIAGNOSIS:   Severe Malnutrition related to social / environmental circumstances (poor PO intake related to poor dentition after taking lithium) as evidenced by severe fat depletion, severe muscle depletion.  GOAL:   Patient will meet greater than or equal to 90% of their needs  MONITOR:   Vent status, Labs, Weight trends, TF tolerance, Skin, I & O's  REASON FOR ASSESSMENT:   Ventilator    ASSESSMENT:   51 year old female with PMHx of bipolar disorder, poor dentition, unexplained wt loss admitted after cardiac arrest. Found unconscious by husband who started CPR immediately. Intubated in ED on 05/19/22.  Met with patient's husband at bedside. He reports pt was previously on lithium 3-4 years ago. It led to poor dentition. Family were hoping to get teeth pulled but hadn't been able to due to financial reasons. Patient has had poor oral intake since then related to mouth pain/pain with eating. Husband also endorses patient has difficulty tolerating the smell of food. She eats softer  foods but he reports she only eats small amounts throughout the day. Reports patient didn't tolerate Boost or Ensure supplements as she had constipation after drinking them.   Husband reports weight at highest point was over 300 lbs many years ago. Significant weight loss several years ago after starting lithium. Limited wt history available in chart. Pt was 99.8 kg (219.6 lbs) on 01/25/2014 and 87.4 kg (192.3 lbs) on 08/16/2018. Pt has lost 37.4 kg (43% body weight) since 08/16/2018 (approximately 3 years and 9 months).  Patient is currently intubated on ventilator support MV: 9.2 L/min Temp (24hrs), Avg:95 F (35 C), Min:89.8 F (32.1 C), Max:97.7 F (36.5 C)  Propofol: 4 ml/hr (106 kcal daily)  TTM protocol ordered but pt was hypothermic on admission. Temperatures are being monitored.  Enteral Access: Previously had OGT placed 05/19/22; 55 cm at mouth; tip in stomach with side port near GE junction per chest x-ray 05/19/22 with recommendation to advance 6-7 cm  Now s/p placement of Cortrak in left nare with bridle secured at 76 cm; tip projects over distal stomach/proximal duodenum per abdominal x-ray 10/18  UOP: 1 occurrence UOP yesterday; 345 mL UOP so far today  I/O: +336.3 mL since admission  Medications reviewed and include: Colace 100 mg BID, Novolog 0-9 units Q4hrs, Medihoney to wounds, pantoprazole, Miralax 17 grams daily, valproic acid, ceftriaxone, fentanyl gtt, propofol gtt, sodium bicarbonate 150 mEq in sterile water at 100 mL/hr.  Labs reviewed: BUN 35, Potassium 4 today (was 5.5 on admission). Magnesium 2.4 on 05/19/22. No baseline phosphorus.  Discussed with Kerman Passey, RD, RN, and MD.  Plan is for placement of Cortrak tube today. Plan is to initiate trickle feeds today.  NUTRITION - FOCUSED PHYSICAL EXAM:  Flowsheet Row Most Recent Value  Orbital Region Severe depletion  Upper Arm Region Severe depletion  Thoracic and Lumbar Region Severe depletion  Buccal Region  Unable to assess  Temple Region Severe depletion  Clavicle Bone Region Severe depletion  Clavicle and Acromion Bone Region Severe depletion  Scapular Bone Region Unable to assess  Dorsal Hand Moderate depletion  Patellar Region Severe depletion  Anterior Thigh Region Severe depletion  Posterior Calf Region Severe depletion  Edema (RD Assessment) None  Hair Reviewed  [dry, brittle]  Eyes Unable to assess  Mouth Reviewed  [poor dentition]  Skin Reviewed  Nails Reviewed       Diet Order:   Diet Order             Diet NPO time specified  Diet effective now                  EDUCATION NEEDS:   Not appropriate for education at this time  Skin:  Skin Assessment: Skin Integrity Issues: Skin Integrity Issues:: DTI, Stage II, Stage III DTI: left lateral heel (2cm x 2cm); right lateral heel (1cm x 2cm) Stage II: sacrum (7cm x 10cm), vertebral column (3cm x 1cm), lower vertebral column (1cm x 1cm) Stage III: upper vertebral column (1cm x 1cm)  Last BM:  PTA  Height:   Ht Readings from Last 1 Encounters:  05/19/22 $RemoveB'5\' 6"'BpwfAUAk$  (1.676 m)    Weight:   Wt Readings from Last 1 Encounters:  05/20/22 50 kg   Ideal Body Weight:  59.1 kg  BMI:  Body mass index is 17.79 kg/m.  Estimated Nutritional Needs:   Kcal:  1500-1750  Protein:  75-100 grams  Fluid:  1.5-1.75 L/day  Loanne Drilling, MS, RD, LDN, CNSC Pager number available on Amion

## 2022-05-20 NOTE — H&P (Deleted)
NAME:  Barbarajean Kinzler, MRN:  [id number], DOB:  1971-07-12, LOS: 1 ADMISSION DATE:  05/19/2022, CONSULTATION DATE:  05/19/22 REFERRING MD:  Billy Fischer, CHIEF COMPLAINT:  found down, cardiac arrest    History of Present Illness:  51 yo woman with hx of bipolar disorder,  here after cardiac arrest.   Per family has been feeling sick and taking trazodone and benadryl at night. They thought she had covid a couple of weeks ago, then recovered somewhat.  For past two weeks has been sob intermittently with some minimal cough, fevers, sore throat.  Her husband came home and she was alert and talking, he went to the bathroom.  10- min later he came back to find her unconscious.  He started CPR immediately, EMS came fairly quickly.  She was reportedly found to be in PEA.  Given 1 mg epi, also lidocaine.  Found once to be in shockable rhythm, shocked once.  Rosc after about 26 min, while in route.   Per family has undergone large weight loss  a few years ago, unclear cause.  Patient had felt it was from lithium but unclear when last takng this..  Poor po intake from low appeitite, nausea and vomiting.  She apparently has not wanted to go to visit the dr.  She recently has only done phone visits with psychiatry.   Sleeps sitting up in recliner.   Per her mother, she has not felt well in years, has complained of some virus or other acute illness affecting her for the past year.   Intubated in ED ( Had king airway in place) at 820 per drug record.  In ED hypothermic  ABG 7.206/49/82 Cefepime and vanc ordered.   AKI cr 1.2 from 0.79 WBC 14.3   Home meds klonipin, trazodone depakote.   Pertinent  Medical History  Bipolar disorder Unexplained weight loss  Poor dentition.  Significant Hospital Events: Including procedures, antibiotic start and stop dates in addition to other pertinent events     Interim History / Subjective:    Objective   Blood pressure 118/76, pulse 75, temperature (!) 90.2  F (32.3 C), temperature source Core, resp. rate 18, height 5\' 6"  (1.676 m), weight 50 kg, SpO2 97 %.    Vent Mode: PRVC FiO2 (%):  [80 %] 80 % Set Rate:  [15 bmp-18 bmp] 18 bmp Vt Set:  [470 mL] 470 mL PEEP:  [5 cmH20] 5 cmH20 Plateau Pressure:  [15 cmH20-16 cmH20] 16 cmH20  No intake or output data in the 24 hours ending 05/20/22 0202 Filed Weights   05/19/22 2000 05/19/22 2112  Weight: 61 kg 50 kg    Examination: General: nad, non responsive on vent, temporal wasting.  HENT: pupils 84mma and no responsivle1q  Lungs: CTAB Cardiovascular: rrr no mgr  Abdomen: thin non distend 3o bos.  Extremities: thin Neuro: non resonsive to painful stimulsi   Resolved Hospital Problem list    Assessment & Plan:  Cardiac arrest  Afib Anxiety depression  TSH pending.   Examine teeth  Bedside echo Central line Lactic Pending    Best Practice (right click and "Reselect all SmartList Selections" daily)   Diet/type: NPO DVT prophylaxis: heparin  GI prophylaxis: PPI Lines: N/A Foley:  none Code Status:  full code Last date of multidisciplinary goals of care discussion []   Labs   CBC: Recent Labs  Lab 05/19/22 2020 05/19/22 2037 05/19/22 2107  WBC 26.1*  --   --   NEUTROABS 22.4*  --   --  HGB 11.8* 12.6  12.9 10.9*  HCT 36.5 37.0  38.0 32.0*  MCV 97.3  --   --   PLT 237  --   --      Basic Metabolic Panel: Recent Labs  Lab 05/19/22 2020 05/19/22 2037 05/19/22 2107  NA 138 132*  133* 134*  K 5.5* 5.3*  5.3* 5.2*  CL 96* 99  --   CO2 20*  --   --   GLUCOSE 144* 138*  --   BUN 32* 38*  --   CREATININE 1.05* 1.20*  --   CALCIUM 9.4  --   --   MG 2.4  --   --     GFR: Estimated Creatinine Clearance: 43.8 mL/min (A) (by C-G formula based on SCr of 1.2 mg/dL (H)). Recent Labs  Lab 05/19/22 2020 05/19/22 2051  PROCALCITON 0.97  --   WBC 26.1*  --   LATICACIDVEN  --  >9.0*    Liver Function Tests: Recent Labs  Lab 05/19/22 2020  AST 435*  ALT  132*  ALKPHOS 83  BILITOT 1.6*  PROT 4.6*  ALBUMIN 1.8*   Recent Labs  Lab 05/19/22 2020  LIPASE 26   No results for input(s): "AMMONIA" in the last 168 hours.  ABG    Component Value Date/Time   PHART 7.206 (L) 05/19/2022 2107   PCO2ART 49.1 (H) 05/19/2022 2107   PO2ART 82 (L) 05/19/2022 2107   HCO3 21.2 05/19/2022 2107   TCO2 23 05/19/2022 2107   ACIDBASEDEF 8.0 (H) 05/19/2022 2107   O2SAT 96 05/19/2022 2107     Coagulation Profile: No results for input(s): "INR", "PROTIME" in the last 168 hours.  Cardiac Enzymes: No results for input(s): "CKTOTAL", "CKMB", "CKMBINDEX", "TROPONINI" in the last 168 hours.  HbA1C: Hgb A1c MFr Bld  Date/Time Value Ref Range Status  06/07/2017 09:31 AM 5.9 (H) 4.8 - 5.6 % Final    Comment:             Prediabetes: 5.7 - 6.4          Diabetes: >6.4          Glycemic control for adults with diabetes: <7.0     CBG: Recent Labs  Lab 05/19/22 2014  GLUCAP 130*     Review of Systems:   Review of Systems  Constitutional:  Positive for fever.  HENT:  Negative for hearing loss.   Eyes:  Negative for discharge and redness.  Respiratory:  Positive for shortness of breath. Negative for cough.   Cardiovascular:  Positive for chest pain.  Gastrointestinal:  Negative for abdominal pain, blood in stool, constipation, heartburn, melena, nausea and vomiting.  Genitourinary:  Negative for dysuria.  Musculoskeletal:  Positive for myalgias.  Skin:  Negative for rash.     Past Medical History:  She,  has a past medical history of Prolapse, disk.   Surgical History:   Past Surgical History:  Procedure Laterality Date   NECK SURGERY       Social History:   reports that she has been smoking cigarettes. She has a 9.00 pack-year smoking history. She has never used smokeless tobacco. She reports that she does not drink alcohol and does not use drugs.   Family History:  Her family history includes Mental illness in her mother and sister.    Allergies Allergies  Allergen Reactions   Abilify [Aripiprazole] Swelling and Rash   Lamictal [Lamotrigine] Swelling and Rash   Latuda [Lurasidone] Other (See Comments)  Caused leg weakness     Home Medications  Prior to Admission medications   Medication Sig Start Date End Date Taking? Authorizing Provider  albuterol (PROVENTIL HFA) 108 (90 Base) MCG/ACT inhaler Inhale 1-2 puffs into the lungs every 6 (six) hours as needed for wheezing or shortness of breath.    [provider]  clonazePAM (KLONOPIN) 0.5 MG tablet Take one tab twice a daily and 3rd needed for severe anxiety 03/10/22   Arfeen, Arlyce Harman, MD  divalproex (DEPAKOTE ER) 250 MG 24 hr tablet Take 1 tablet (250 mg total) by mouth 2 (two) times daily. 03/10/22   Arfeen, Arlyce Harman, MD  gabapentin (NEURONTIN) 600 MG tablet Take 1 tablet (600 mg total) by mouth at bedtime. Patient not taking: Reported on 09/09/2021 03/03/21   Cloria Spring, MD  ibuprofen (ADVIL,MOTRIN) 200 MG tablet Take 800 mg by mouth 2 (two) times daily as needed (for pain).    [provider]  traZODone (DESYREL) 100 MG tablet TAKE  ONE TABLET BY MOUTH AT BEDTIME 03/10/22   Arfeen, Arlyce Harman, MD     Critical care time: 45 min

## 2022-05-20 NOTE — Progress Notes (Signed)
EEG complete - results pending 

## 2022-05-20 NOTE — Consult Note (Addendum)
WOC Nurse Consult Note: Reason for Consult: Full thickness pressure injuries, present on admission.  Spouse at bedside.  Deep tissue injury to bilateral heels Coccyx and right ischium Unstageable pressure injuries to lumbar and thoracic spine, bony prominences Wound type:Pressure, nonhealing Pressure Injury POA: Yes Measurement: Left lateral heel:  2 cm x 2 cm maroon discoloration, boggy round intact  Right lateral heel 1 cm x 2 cm intact nonblanchable erythema 2- 1 cm round lesions to lumbar spine Thoracic spine 1 cm x 0.2 cm lesion along spine with slough to wound bed Wound bed: spine:  yellow slough Right ischium, nonintact red Coccyx and heels are intact, nonblanchable  Drainage (amount, consistency, odor) minimal serosanguinous  no odor Periwound: intact Dressing procedure/placement/frequency:  Is on mattress with low air loss feature.  Offload pressure to heels, apply Prevalon boots Coccyx and ischium, apply silicone foam and turn and reposition every 2 hours to offload pressure Apply medihoney to open wounds along spine, cover with silicone foam dressing.  Apply daily.  Will not follow at this time.  Please re-consult if needed.  Domenic Moras MSN, RN, FNP-BC CWON Wound, Ostomy, Continence Nurse Pager [phone]

## 2022-05-21 DIAGNOSIS — J9600 Acute respiratory failure, unspecified whether with hypoxia or hypercapnia: Secondary | ICD-10-CM

## 2022-05-21 DIAGNOSIS — J9601 Acute respiratory failure with hypoxia: Secondary | ICD-10-CM | POA: Insufficient documentation

## 2022-05-21 DIAGNOSIS — J189 Pneumonia, unspecified organism: Secondary | ICD-10-CM

## 2022-05-21 LAB — CBC
HCT: 29.1 % — ABNORMAL LOW (ref 36.0–46.0)
Hemoglobin: 10.1 g/dL — ABNORMAL LOW (ref 12.0–15.0)
MCH: 30.6 pg (ref 26.0–34.0)
MCHC: 34.7 g/dL (ref 30.0–36.0)
MCV: 88.2 fL (ref 80.0–100.0)
Platelets: 146 10*3/uL — ABNORMAL LOW (ref 150–400)
RBC: 3.3 MIL/uL — ABNORMAL LOW (ref 3.87–5.11)
RDW: 14.2 % (ref 11.5–15.5)
WBC: 22 10*3/uL — ABNORMAL HIGH (ref 4.0–10.5)
nRBC: 0 % (ref 0.0–0.2)

## 2022-05-21 LAB — COMPREHENSIVE METABOLIC PANEL
ALT: 230 U/L — ABNORMAL HIGH (ref 0–44)
AST: 797 U/L — ABNORMAL HIGH (ref 15–41)
Albumin: 1.8 g/dL — ABNORMAL LOW (ref 3.5–5.0)
Alkaline Phosphatase: 59 U/L (ref 38–126)
Anion gap: 12 (ref 5–15)
BUN: 41 mg/dL — ABNORMAL HIGH (ref 6–20)
CO2: 30 mmol/L (ref 22–32)
Calcium: 8.1 mg/dL — ABNORMAL LOW (ref 8.9–10.3)
Chloride: 97 mmol/L — ABNORMAL LOW (ref 98–111)
Creatinine, Ser: 1.11 mg/dL — ABNORMAL HIGH (ref 0.44–1.00)
GFR, Estimated: >60 mL/min (ref >60)
Glucose, Bld: 119 mg/dL — ABNORMAL HIGH (ref 70–99)
Potassium: 3.3 mmol/L — ABNORMAL LOW (ref 3.5–5.1)
Sodium: 139 mmol/L (ref 135–145)
Total Bilirubin: 0.5 mg/dL (ref 0.3–1.2)
Total Protein: 4 g/dL — ABNORMAL LOW (ref 6.5–8.1)

## 2022-05-21 LAB — POCT I-STAT 7, (LYTES, BLD GAS, ICA,H+H)
Acid-Base Excess: 12 mmol/L — ABNORMAL HIGH (ref 0.0–2.0)
Bicarbonate: 37.1 mmol/L — ABNORMAL HIGH (ref 20.0–28.0)
Calcium, Ion: 1.19 mmol/L (ref 1.15–1.40)
HCT: 31 % — ABNORMAL LOW (ref 36.0–46.0)
Hemoglobin: 10.5 g/dL — ABNORMAL LOW (ref 12.0–15.0)
O2 Saturation: 100 %
Patient temperature: 36.5
Potassium: 3.2 mmol/L — ABNORMAL LOW (ref 3.5–5.1)
Sodium: 136 mmol/L (ref 135–145)
TCO2: 39 mmol/L — ABNORMAL HIGH (ref 22–32)
pCO2 arterial: 50.3 mmHg — ABNORMAL HIGH (ref 32–48)
pH, Arterial: 7.474 — ABNORMAL HIGH (ref 7.35–7.45)
pO2, Arterial: 263 mmHg — ABNORMAL HIGH (ref 83–108)

## 2022-05-21 LAB — GLUCOSE, CAPILLARY
Glucose-Capillary: 114 mg/dL — ABNORMAL HIGH (ref 70–99)
Glucose-Capillary: 117 mg/dL — ABNORMAL HIGH (ref 70–99)
Glucose-Capillary: 121 mg/dL — ABNORMAL HIGH (ref 70–99)
Glucose-Capillary: 125 mg/dL — ABNORMAL HIGH (ref 70–99)
Glucose-Capillary: 128 mg/dL — ABNORMAL HIGH (ref 70–99)
Glucose-Capillary: 94 mg/dL (ref 70–99)

## 2022-05-21 LAB — CK
Total CK: 14326 U/L — ABNORMAL HIGH (ref 38–234)
Total CK: 11175 U/L — ABNORMAL HIGH (ref 38–234)
Total CK: 8500 U/L — ABNORMAL HIGH (ref 38–234)

## 2022-05-21 LAB — MAGNESIUM
Magnesium: 1.7 mg/dL (ref 1.7–2.4)
Magnesium: 2.4 mg/dL (ref 1.7–2.4)

## 2022-05-21 LAB — PHOSPHORUS
Phosphorus: 3.3 mg/dL (ref 2.5–4.6)
Phosphorus: 2.8 mg/dL (ref 2.5–4.6)

## 2022-05-21 LAB — LEGIONELLA PNEUMOPHILA SEROGP 1 UR AG: L. pneumophila Serogp 1 Ur Ag: NEGATIVE

## 2022-05-21 LAB — LACTIC ACID, PLASMA: Lactic Acid, Venous: 1.7 mmol/L (ref 0.5–1.9)

## 2022-05-21 LAB — TRIGLYCERIDES: Triglycerides: 267 mg/dL — ABNORMAL HIGH (ref <150)

## 2022-05-21 MED ORDER — QUETIAPINE FUMARATE 25 MG PO TABS
25.0000 mg | ORAL_TABLET | Freq: Two times a day (BID) | ORAL | Status: DC
  Administered 2022-05-21 (×2): 25 mg
  Filled 2022-05-21 (×2): qty 1

## 2022-05-21 MED ORDER — LACTATED RINGERS IV SOLN: INTRAVENOUS | Status: DC

## 2022-05-21 MED ORDER — POTASSIUM CHLORIDE 10 MEQ/100ML IV SOLN
10.0000 meq | INTRAVENOUS | Status: DC
  Filled 2022-05-21: qty 100

## 2022-05-21 MED ORDER — OXYCODONE HCL 5 MG PO TABS: 5.0000 mg | ORAL_TABLET | Freq: Four times a day (QID) | ORAL | Status: DC

## 2022-05-21 MED ORDER — ARFORMOTEROL TARTRATE 15 MCG/2ML IN NEBU
15.0000 ug | INHALATION_SOLUTION | Freq: Two times a day (BID) | RESPIRATORY_TRACT | Status: DC
  Administered 2022-05-21 – 2022-06-05 (×30): 15 ug via RESPIRATORY_TRACT
  Filled 2022-05-21 (×32): qty 2

## 2022-05-21 MED ORDER — MIDAZOLAM HCL 2 MG/2ML IJ SOLN
2.0000 mg | INTRAMUSCULAR | Status: DC | PRN
  Administered 2022-05-21 – 2022-05-30 (×19): 2 mg via INTRAVENOUS
  Filled 2022-05-21 (×20): qty 2

## 2022-05-21 MED ORDER — VITAL AF 1.2 CAL PO LIQD: 1000.0000 mL | ORAL | Status: DC

## 2022-05-21 MED ORDER — REVEFENACIN 175 MCG/3ML IN SOLN
175.0000 ug | Freq: Every day | RESPIRATORY_TRACT | Status: DC
  Administered 2022-05-22 – 2022-06-25 (×34): 175 ug via RESPIRATORY_TRACT
  Filled 2022-05-21 (×35): qty 3

## 2022-05-21 MED ORDER — ARFORMOTEROL TARTRATE 15 MCG/2ML IN NEBU: 15.0000 ug | INHALATION_SOLUTION | Freq: Two times a day (BID) | RESPIRATORY_TRACT | Status: DC

## 2022-05-21 MED ORDER — MIDAZOLAM HCL 2 MG/2ML IJ SOLN: 4.0000 mg | Freq: Once | INTRAMUSCULAR | Status: DC

## 2022-05-21 MED ORDER — DEXMEDETOMIDINE HCL IN NACL 400 MCG/100ML IV SOLN
0.4000 ug/kg/h | INTRAVENOUS | Status: DC
  Administered 2022-05-21: 1.2 ug/kg/h via INTRAVENOUS
  Administered 2022-05-21: 0.4 ug/kg/h via INTRAVENOUS
  Administered 2022-05-21: 1.2 ug/kg/h via INTRAVENOUS
  Administered 2022-05-22: 1 ug/kg/h via INTRAVENOUS
  Administered 2022-05-22: 1.2 ug/kg/h via INTRAVENOUS
  Administered 2022-05-22 – 2022-05-23 (×2): 1 ug/kg/h via INTRAVENOUS
  Filled 2022-05-21 (×7): qty 100

## 2022-05-21 MED ORDER — MAGNESIUM SULFATE 2 GM/50ML IV SOLN
2.0000 g | Freq: Once | INTRAVENOUS | Status: AC
  Administered 2022-05-21: 2 g via INTRAVENOUS
  Filled 2022-05-21: qty 50

## 2022-05-21 MED ORDER — MIDAZOLAM HCL 2 MG/2ML IJ SOLN
INTRAMUSCULAR | Status: AC
  Filled 2022-05-21: qty 2

## 2022-05-21 MED ORDER — POTASSIUM CHLORIDE 20 MEQ PO PACK
40.0000 meq | PACK | ORAL | Status: AC
  Administered 2022-05-21 (×2): 40 meq
  Filled 2022-05-21 (×2): qty 2

## 2022-05-21 MED ORDER — VITAL AF 1.2 CAL PO LIQD
1000.0000 mL | ORAL | Status: DC
  Administered 2022-05-21: 1000 mL

## 2022-05-21 MED ORDER — POTASSIUM CHLORIDE 20 MEQ PO PACK: 20.0000 meq | PACK | ORAL | Status: DC

## 2022-05-21 MED ORDER — POTASSIUM CHLORIDE 20 MEQ PO PACK: 40.0000 meq | PACK | Freq: Two times a day (BID) | ORAL | Status: DC

## 2022-05-21 MED ORDER — OXYCODONE HCL 5 MG PO TABS
5.0000 mg | ORAL_TABLET | Freq: Four times a day (QID) | ORAL | Status: DC
  Administered 2022-05-21 – 2022-05-25 (×16): 5 mg
  Filled 2022-05-21 (×16): qty 1

## 2022-05-21 MED ORDER — ALBUTEROL SULFATE (2.5 MG/3ML) 0.083% IN NEBU
2.5000 mg | INHALATION_SOLUTION | RESPIRATORY_TRACT | Status: DC | PRN
  Filled 2022-05-21: qty 3

## 2022-05-21 MED ORDER — REVEFENACIN 175 MCG/3ML IN SOLN: 175.0000 ug | Freq: Every day | RESPIRATORY_TRACT | Status: DC

## 2022-05-21 NOTE — Progress Notes (Signed)
[facility] ADULT ICU REPLACEMENT PROTOCOL   The patient does apply for the [facility] Adult ICU Electrolyte Replacment Protocol based on the criteria listed below:   1.Exclusion criteria: TCTS patients, ECMO patients, and Dialysis patients 2. Is GFR >/= 30 ml/min? Yes.    Patient's GFR today is >60 3. Is SCr </= 2? Yes.   Patient's SCr is 1.11 mg/dL 4. Did SCr increase >/= 0.5 in 24 hours? No. 5.Pt's weight >40kg  Yes.   6. Abnormal electrolyte(s): k 3.3, mag 1.7  7. Electrolytes replaced per protocol 8.  Call MD STAT for K+ </= 2.5, Phos </= 1, or Mag </= 1 Physician:    Ronda Fairly A 05/21/2022 6:42 AM

## 2022-05-21 NOTE — Progress Notes (Signed)
Critical Care  Woke up agitated and with good strength on sedation. Reached for tube. Sedation increased and restraints ordered.  Addition sedation started enterally this morning. Hopefully will allow for smoother wake-up.   Monica Brood, MD [facility] ICU Physician [place]  Pager: [phone] Or Epic Secure Chat After hours: [phone].  05/21/2022, 5:40 PM

## 2022-05-21 NOTE — Progress Notes (Signed)
Nutrition Follow-up  DOCUMENTATION CODES:   Severe malnutrition in context of social or environmental circumstances, Underweight  INTERVENTION:  Advance tube feeds via Cortrak today: -Vital AF 1.2 at 25 mL/hour.  Pending electrolytes and tolerance, continue to advance as tolerated to goal regimen via Cortrak: -Advance by 10 mL/hour every 8 hours to goal rate of Vital AF 1.2 at 55 mL/hour x 24 hours (1320 mL goal daily volume). -Goal regimen provides 1584 kcal, 99 grams of protein, 1069 mL H2O daily.  Continue to monitor magnesium, potassium, and phosphorus BID for at least 3 days, MD to replete as needed, as pt is at risk for refeeding syndrome.   Continue thiamine 100 mg daily and folic acid 1 mg daily.  Continue to follow micronutrient labs as pt is at risk of deficiency. Pending: serum copper, zinc, folate, vitamin A, vitamin C.  NUTRITION DIAGNOSIS:   Severe Malnutrition related to social / environmental circumstances (poor PO intake related to poor dentition after taking lithium) as evidenced by severe fat depletion, severe muscle depletion.  Ongoing.  GOAL:   Patient will meet greater than or equal to 90% of their needs  Progressing with advancement of tube feeds.  MONITOR:   Vent status, Labs, Weight trends, TF tolerance, Skin, I & O's  REASON FOR ASSESSMENT:   Ventilator    ASSESSMENT:   51 year old female with PMHx of bipolar disorder, poor dentition, unexplained wt loss admitted after cardiac arrest. Found unconscious by husband who started CPR immediately. Intubated in ED on 05/19/22.  Patient's husband and son at bedside at time of RD assessment. Discussed with RN. Patient tolerating tube feeds at 15 mL/hour well. TTM protocol in place.  Patient is currently intubated on ventilator support MV: 9.2 L/min Temp (24hrs), Avg:98.4 F (36.9 C), Min:97.3 F (36.3 C), Max:98.8 F (37.1 C)  Propofol: 3 ml/hr (79.2 kcal daily)  Enteral access: Cortrak in left  nare with bridle secured at 76 cm; tip projects over distal stomach/proximal duodenum per abdominal x-ray 10/18  Enteral nutrition: Vital AF 1.2 at 15 mL/hour + free water flush 30 mL every 4 hours  UOP: 995 mL (0.8 mL/kg/hr)  I/O: +2,297.9 mL since admission  Medications reviewed and include: Colace 100 mg BID, folic acid 1 mg daily, Novolog 0-9 units Q4hrs, Medihoney, thiamine 100 mg daily, valproic acid, ceftriaxone, Precedex gtt, fentanyl gtt, LR at 75 mL/hour, norepinephrine gtt at 5 mcg/min, propofol gtt.  Labs reviewed: Potassium 3.2, Chloride 97, BUN 41 (trending up), Creatinine 1.11 (trending up), Phosphorus 3.3, Magnesium 1.7. CRP: 23.2 H 05/20/22 Vitamin B12: 4289 H 05/20/22 Pending labs: serum copper, zinc, folate, vitamin A, vitamin C  Discussed with MD via secure chat. Plan is to advance feeds to 25 mL/hour.  Diet Order:   Diet Order             Diet NPO time specified  Diet effective now                   EDUCATION NEEDS:   Not appropriate for education at this time  Skin:  Skin Assessment: Skin Integrity Issues: Skin Integrity Issues:: DTI, Stage II, Stage III DTI: left lateral heel (2cm x 2cm); right lateral heel (1cm x 2cm) Stage II: sacrum (7cm x 10cm), vertebral column (3cm x 1cm), lower vertebral column (1cm x 1cm) Stage III: upper vertebral column (1cm x 1cm)  Last BM:  PTA  Height:   Ht Readings from Last 1 Encounters:  05/19/22 5\' 6"  (1.676  m)    Weight:   Wt Readings from Last 1 Encounters:  05/21/22 50 kg   Ideal Body Weight:  59.1 kg  BMI:  Body mass index is 17.79 kg/m.  Estimated Nutritional Needs:   Kcal:  1500-1750  Protein:  75-100 grams  Fluid:  1.5-1.75 L/day  Loanne Drilling, MS, RD, LDN, CNSC Pager number available on Amion

## 2022-05-21 NOTE — Progress Notes (Signed)
NAME:  Monica Hopkins, MRN:  [id number], DOB:  February 04, 1971, LOS: 2 ADMISSION DATE:  05/19/2022, CONSULTATION DATE:  05/19/22 REFERRING MD:  Dalene Seltzer, CHIEF COMPLAINT:  found down, cardiac arrest    History of Present Illness:  51 yo woman with hx of bipolar disorder,  here after cardiac arrest.   Per family has been feeling sick and taking trazodone and benadryl at night. They thought she had covid a couple of weeks ago, then recovered somewhat.  For past two weeks has been sob intermittently with some minimal cough, fevers, sore throat.  Her husband came home and she was alert and talking, he went to the bathroom.  10- min later he came back to find her unconscious.  He started CPR immediately, EMS came fairly quickly.  She was reportedly found to be in PEA.  Given 1 mg epi, also lidocaine.  Found once to be in shockable rhythm, shocked once.  Rosc after about 26 min, while in route.   Per family has undergone large weight loss  a few years ago, unclear cause.  Patient had felt it was from lithium but unclear when last takng this..  Poor po intake from low appeitite, nausea and vomiting.  She apparently has not wanted to go to visit the dr.  She recently has only done phone visits with psychiatry.   Sleeps sitting up in recliner.   Per her mother, she has not felt well in years, has complained of some virus or other acute illness affecting her for the past year.   Intubated in ED ( Had king airway in place) at 820 per drug record.  In ED hypothermic  ABG 7.206/49/82 Cefepime and vanc ordered.   AKI cr 1.2 from 0.79 WBC 14.3   Home meds klonipin, trazodone depakote.   Pertinent  Medical History  Bipolar disorder Unexplained weight loss  Poor dentition.  Significant Hospital Events: Including procedures, antibiotic start and stop dates in addition to other pertinent events   10/17: admitted post cardiac arrest; ROSC 26 minutes  Interim History / Subjective:  Intubated on mech  vent On 3 mcg levo Bicarb started yesterday for elevated ck K low; mag/k being repleted this am Sedated on prop/fent; was swinging arms non purposefully per nurse  Objective   Blood pressure (!) 94/53, pulse 84, temperature 97.7 F (36.5 C), resp. rate 20, height 5\' 6"  (1.676 m), weight 50 kg, SpO2 100 %.    Vent Mode: PRVC FiO2 (%):  [70 %-80 %] 80 % Set Rate:  [20 bmp] 20 bmp Vt Set:  [470 mL] 470 mL PEEP:  [8 cmH20] 8 cmH20 Plateau Pressure:  [17 cmH20-18 cmH20] 18 cmH20   Intake/Output Summary (Last 24 hours) at 05/21/2022 0857 Last data filed at 05/21/2022 0630 Gross per 24 hour  Intake 2647 ml  Output 950 ml  Net 1697 ml    Filed Weights   05/19/22 2112 05/20/22 0600 05/21/22 0530  Weight: 50 kg 50 kg 50 kg    Examination: General:  cachectic appearing female; critically ill appearing on mech vent HEENT: MM pink/moist; ETT in place; poor dentition Neuro: Currently sedated; per nurse MAE but non purposeful mvt; not following commands; PERRL CV: s1s2, no m/r/g PULM:  dim clear BS bilaterally; on mech vent PRVC GI: soft, bsx4 active  Extremities: warm/dry, no edema  Skin: no rashes or lesions appreciated  Resolved Hospital Problem list    Assessment & Plan:  Cardiac arrest: found to be in PEA.  Given 1  mg epi, also lidocaine.  Found once to be in shockable rhythm, shocked once.  Rosc after about 26 min, while in route.  P: -telemetry monitoring -wean levo for MAP goal >65 -replete electrolytes this am -TTM protocol  Acute respiratory failure Strep pneumonia positive P: -wean sedation and attempt sbt later this am -LTVV strategy with tidal volumes of 6-8 cc/kg ideal body weight -Wean PEEP/FiO2 for SpO2 >92% -VAP bundle in place -Daily SAT and SBT -PAD protocol in place -wean sedation for RASS goal 0 to -1 -rocephin for strep pneumonia -follow cultures and urine legionella -trend wbc/fever curve  Acute encephalopathy -UDS positive for THC; family  states she took suboxone 10/17 P: -patient awake MAE but not following commands -limit sedating meds -add precedex to help wean off prop/fent for RASS 0  -adding seroquel and oxy; continue home klonopin and depakote -EEG showing no seizures  Diastolic HF: lvef 55%; grad 1 diastolic dysfunction; D-shaped septum indicating RV pressure/volume overload; mod reduced RV function and mod elevated PA pressure P: -daily weights; strict I/O's -monitor UOP -hold on BB while hypotensive  Rhabdo Hematuria Hyperkalemia/Hypokalemia AKI P: -consider gentle IV fluids given RV function and concern for overload; hold bicarb -trend ck -K/mag repleted this am; repeat bmp later today -Trend BMP / urinary output -Replace electrolytes as indicated -Avoid nephrotoxic agents, ensure adequate renal perfusion  Severe protein calorie malnutrition P: -diet consulted -advance TF slowly to avoid refeeding  Anxiety depression  P: -resume depakote and klonopin -start seroquel -hold trazodone   Best Practice (right click and "Reselect all SmartList Selections" daily)   Diet/type: tubefeeds and NPO w/ meds via tube DVT prophylaxis: heparin subq GI prophylaxis: PPI Lines: Arterial Line Foley:  yes Code Status:  full code Last date of multidisciplinary goals of care discussion [10/19 spoke with patient's husband at bedside and updated]  Labs   CBC: Recent Labs  Lab 05/19/22 2020 05/19/22 2037 05/19/22 2107 05/20/22 0243 05/20/22 0359 05/21/22 0400 05/21/22 0812  WBC 26.1*  --   --   --  21.3* 22.0*  --   NEUTROABS 22.4*  --   --   --   --   --   --   HGB 11.8*   < > 10.9* 12.2 12.6 10.1* 10.5*  HCT 36.5   < > 32.0* 36.0 38.7 29.1* 31.0*  MCV 97.3  --   --   --  92.1 88.2  --   PLT 237  --   --   --  222 146*  --    < > = values in this interval not displayed.     Basic Metabolic Panel: Recent Labs  Lab 05/19/22 2020 05/19/22 2037 05/19/22 2107 05/20/22 0243 05/20/22 0359  05/20/22 1206 05/20/22 1626 05/21/22 0400 05/21/22 0812  NA 138 132*  133* 134* 134* 136  --   --  139 136  K 5.5* 5.3*  5.3* 5.2* 4.1 4.0  --   --  3.3* 3.2*  CL 96* 99  --   --  99  --   --  97*  --   CO2 20*  --   --   --  26  --   --  30  --   GLUCOSE 144* 138*  --   --  175*  --   --  119*  --   BUN 32* 38*  --   --  35*  --   --  41*  --   CREATININE 1.05*  1.20*  --   --  0.85  --   --  1.11*  --   CALCIUM 9.4  --   --   --  8.4*  --   --  8.1*  --   MG 2.4  --   --   --   --   --  1.8 1.7  --   PHOS  --   --   --   --   --  3.6 3.7 3.3  --     GFR: Estimated Creatinine Clearance: 47.3 mL/min (A) (by C-G formula based on SCr of 1.11 mg/dL (H)). Recent Labs  Lab 05/19/22 2020 05/19/22 2051 05/20/22 0104 05/20/22 0359 05/21/22 0400  PROCALCITON 0.97  --   --   --   --   WBC 26.1*  --   --  21.3* 22.0*  LATICACIDVEN  --  >9.0* 3.0* 2.4*  --      Liver Function Tests: Recent Labs  Lab 05/19/22 2020 05/21/22 0400  AST 435* 797*  ALT 132* 230*  ALKPHOS 83 59  BILITOT 1.6* 0.5  PROT 4.6* 4.0*  ALBUMIN 1.8* 1.8*    Recent Labs  Lab 05/19/22 2020  LIPASE 26    No results for input(s): "AMMONIA" in the last 168 hours.  ABG    Component Value Date/Time   PHART 7.474 (H) 05/21/2022 0812   PCO2ART 50.3 (H) 05/21/2022 0812   PO2ART 263 (H) 05/21/2022 0812   HCO3 37.1 (H) 05/21/2022 0812   TCO2 39 (H) 05/21/2022 0812   ACIDBASEDEF 2.0 05/20/2022 0243   O2SAT 100 05/21/2022 0812     Coagulation Profile: No results for input(s): "INR", "PROTIME" in the last 168 hours.  Cardiac Enzymes: Recent Labs  Lab 05/20/22 1206 05/21/22 0400  CKTOTAL 4,849* 14,326*    HbA1C: Hgb A1c MFr Bld  Date/Time Value Ref Range Status  05/20/2022 12:06 PM 5.1 4.8 - 5.6 % Final    Comment:    (NOTE) Pre diabetes:          5.7%-6.4%  Diabetes:              >6.4%  Glycemic control for   <7.0% adults with diabetes   06/07/2017 09:31 AM 5.9 (H) 4.8 - 5.6 % Final     Comment:             Prediabetes: 5.7 - 6.4          Diabetes: >6.4          Glycemic control for adults with diabetes: <7.0     CBG: Recent Labs  Lab 05/20/22 1741 05/20/22 2023 05/20/22 2326 05/21/22 0353 05/21/22 0808  GLUCAP 93 111* 109* 128* 121*     Review of Systems:     Past Medical History:  She,  has a past medical history of Prolapse, disk.   Surgical History:   Past Surgical History:  Procedure Laterality Date   NECK SURGERY       Social History:   reports that she has been smoking cigarettes. She has a 9.00 pack-year smoking history. She has never used smokeless tobacco. She reports that she does not drink alcohol and does not use drugs.   Family History:  Her family history includes Mental illness in her mother and sister.   Allergies Allergies  Allergen Reactions   Abilify [Aripiprazole] Swelling and Rash   Lamictal [Lamotrigine] Swelling and Rash   Latuda [Lurasidone] Other (See Comments)    Caused leg weakness  Home Medications  Prior to Admission medications   Medication Sig Start Date End Date Taking? Authorizing Provider  albuterol (PROVENTIL HFA) 108 (90 Base) MCG/ACT inhaler Inhale 1-2 puffs into the lungs every 6 (six) hours as needed for wheezing or shortness of breath.    [provider]  clonazePAM (KLONOPIN) 0.5 MG tablet Take one tab twice a daily and 3rd needed for severe anxiety 03/10/22   Arfeen, Phillips Grout, MD  divalproex (DEPAKOTE ER) 250 MG 24 hr tablet Take 1 tablet (250 mg total) by mouth 2 (two) times daily. 03/10/22   Arfeen, Phillips Grout, MD  gabapentin (NEURONTIN) 600 MG tablet Take 1 tablet (600 mg total) by mouth at bedtime. Patient not taking: Reported on 09/09/2021 03/03/21   Myrlene Broker, MD  ibuprofen (ADVIL,MOTRIN) 200 MG tablet Take 800 mg by mouth 2 (two) times daily as needed (for pain).    [provider]  traZODone (DESYREL) 100 MG tablet TAKE  ONE TABLET BY MOUTH AT BEDTIME 03/10/22   Arfeen, Phillips Grout, MD      Critical care time: 35 min    JD Anselm Lis [facility] 05/21/2022, 8:57 AM  Please see [email or web address] for pager details.  From 7A-7P if no response, please call [phone]. After hours, please call ELink [phone].

## 2022-05-22 ENCOUNTER — Inpatient Hospital Stay (HOSPITAL_COMMUNITY): Payer: Medicaid Other

## 2022-05-22 LAB — POCT I-STAT 7, (LYTES, BLD GAS, ICA,H+H)
Acid-Base Excess: 6 mmol/L — ABNORMAL HIGH (ref 0.0–2.0)
Acid-Base Excess: 4 mmol/L — ABNORMAL HIGH (ref 0.0–2.0)
Bicarbonate: 30.2 mmol/L — ABNORMAL HIGH (ref 20.0–28.0)
Bicarbonate: 29.9 mmol/L — ABNORMAL HIGH (ref 20.0–28.0)
Calcium, Ion: 1.31 mmol/L (ref 1.15–1.40)
Calcium, Ion: 1.31 mmol/L (ref 1.15–1.40)
HCT: 28 % — ABNORMAL LOW (ref 36.0–46.0)
HCT: 29 % — ABNORMAL LOW (ref 36.0–46.0)
Hemoglobin: 9.5 g/dL — ABNORMAL LOW (ref 12.0–15.0)
Hemoglobin: 9.9 g/dL — ABNORMAL LOW (ref 12.0–15.0)
O2 Saturation: 92 %
O2 Saturation: 91 %
Patient temperature: 36.4
Patient temperature: 36.4
Potassium: 4.2 mmol/L (ref 3.5–5.1)
Potassium: 4.1 mmol/L (ref 3.5–5.1)
Sodium: 135 mmol/L (ref 135–145)
Sodium: 136 mmol/L (ref 135–145)
TCO2: 32 mmol/L (ref 22–32)
TCO2: 31 mmol/L (ref 22–32)
pCO2 arterial: 41.9 mmHg (ref 32–48)
pCO2 arterial: 49.8 mmHg — ABNORMAL HIGH (ref 32–48)
pH, Arterial: 7.464 — ABNORMAL HIGH (ref 7.35–7.45)
pH, Arterial: 7.384 (ref 7.35–7.45)
pO2, Arterial: 58 mmHg — ABNORMAL LOW (ref 83–108)
pO2, Arterial: 61 mmHg — ABNORMAL LOW (ref 83–108)

## 2022-05-22 LAB — CBC
HCT: 28.1 % — ABNORMAL LOW (ref 36.0–46.0)
Hemoglobin: 9.9 g/dL — ABNORMAL LOW (ref 12.0–15.0)
MCH: 31.2 pg (ref 26.0–34.0)
MCHC: 35.2 g/dL (ref 30.0–36.0)
MCV: 88.6 fL (ref 80.0–100.0)
Platelets: 136 10*3/uL — ABNORMAL LOW (ref 150–400)
RBC: 3.17 MIL/uL — ABNORMAL LOW (ref 3.87–5.11)
RDW: 14.4 % (ref 11.5–15.5)
WBC: 25.3 10*3/uL — ABNORMAL HIGH (ref 4.0–10.5)
nRBC: 0 % (ref 0.0–0.2)

## 2022-05-22 LAB — CK: Total CK: 5976 U/L — ABNORMAL HIGH (ref 38–234)

## 2022-05-22 LAB — GLUCOSE, CAPILLARY
Glucose-Capillary: 115 mg/dL — ABNORMAL HIGH (ref 70–99)
Glucose-Capillary: 107 mg/dL — ABNORMAL HIGH (ref 70–99)
Glucose-Capillary: 111 mg/dL — ABNORMAL HIGH (ref 70–99)
Glucose-Capillary: 118 mg/dL — ABNORMAL HIGH (ref 70–99)
Glucose-Capillary: 144 mg/dL — ABNORMAL HIGH (ref 70–99)
Glucose-Capillary: 101 mg/dL — ABNORMAL HIGH (ref 70–99)

## 2022-05-22 LAB — COMPREHENSIVE METABOLIC PANEL
ALT: 244 U/L — ABNORMAL HIGH (ref 0–44)
AST: 563 U/L — ABNORMAL HIGH (ref 15–41)
Albumin: 1.7 g/dL — ABNORMAL LOW (ref 3.5–5.0)
Alkaline Phosphatase: 60 U/L (ref 38–126)
Anion gap: 8 (ref 5–15)
BUN: 46 mg/dL — ABNORMAL HIGH (ref 6–20)
CO2: 30 mmol/L (ref 22–32)
Calcium: 8.5 mg/dL — ABNORMAL LOW (ref 8.9–10.3)
Chloride: 100 mmol/L (ref 98–111)
Creatinine, Ser: 1.7 mg/dL — ABNORMAL HIGH (ref 0.44–1.00)
GFR, Estimated: 36 mL/min — ABNORMAL LOW (ref >60)
Glucose, Bld: 104 mg/dL — ABNORMAL HIGH (ref 70–99)
Potassium: 4.4 mmol/L (ref 3.5–5.1)
Sodium: 138 mmol/L (ref 135–145)
Total Bilirubin: 0.3 mg/dL (ref 0.3–1.2)
Total Protein: 3.8 g/dL — ABNORMAL LOW (ref 6.5–8.1)

## 2022-05-22 LAB — VITAMIN A: Vitamin A (Retinoic Acid): 3.7 ug/dL — ABNORMAL LOW (ref 20.1–62.0)

## 2022-05-22 LAB — PHOSPHORUS
Phosphorus: 2.5 mg/dL (ref 2.5–4.6)
Phosphorus: 2.8 mg/dL (ref 2.5–4.6)

## 2022-05-22 LAB — MAGNESIUM: Magnesium: 2.2 mg/dL (ref 1.7–2.4)

## 2022-05-22 MED ORDER — SENNA 8.6 MG PO TABS
1.0000 | ORAL_TABLET | Freq: Every day | ORAL | Status: DC
  Administered 2022-05-23 – 2022-05-29 (×5): 8.6 mg
  Filled 2022-05-22 (×5): qty 1

## 2022-05-22 MED ORDER — VITAL AF 1.2 CAL PO LIQD
1000.0000 mL | ORAL | Status: DC
  Administered 2022-05-22 – 2022-06-09 (×21): 1000 mL
  Filled 2022-05-22 (×14): qty 1000

## 2022-05-22 MED ORDER — CLONAZEPAM 1 MG PO TABS
1.0000 mg | ORAL_TABLET | Freq: Three times a day (TID) | ORAL | Status: DC
  Administered 2022-05-22 – 2022-05-28 (×21): 1 mg
  Filled 2022-05-22 (×21): qty 1

## 2022-05-22 MED ORDER — SENNA 8.6 MG PO TABS
1.0000 | ORAL_TABLET | Freq: Every day | ORAL | Status: DC
  Administered 2022-05-22: 8.6 mg via ORAL
  Filled 2022-05-22: qty 1

## 2022-05-22 MED ORDER — QUETIAPINE FUMARATE 50 MG PO TABS
50.0000 mg | ORAL_TABLET | Freq: Two times a day (BID) | ORAL | Status: DC
  Administered 2022-05-22 – 2022-05-29 (×16): 50 mg
  Filled 2022-05-22 (×17): qty 1

## 2022-05-22 NOTE — Progress Notes (Signed)
Nutrition Follow-up  DOCUMENTATION CODES:   Severe malnutrition in context of social or environmental circumstances, Underweight  INTERVENTION:   Tube Feeding via Cortrak:  Increase Vital AF 1.2 to 35 ml/hr with orders to titrate by 10 mL every 24 hours until goal rate of 55 ml/hr Goal regimen provides 1584 kcals, 99 g of protein and 1069 mL of free water  Continue thiamine, folic acid Vitamin labs pending, further recommendations to follow  If pt continues without BM, recommend adjusting bowel regimen  Pt remains at high risk for refeeding syndrome; continue to monitor electrolytes. Pt may be a "late re-feeder" given AKI with Rabdo and may not see drop in electrolytes for several days  NUTRITION DIAGNOSIS:   Severe Malnutrition related to social / environmental circumstances (poor PO intake related to poor dentition after taking lithium) as evidenced by severe fat depletion, severe muscle depletion.  Being addressed via TF  GOAL:   Patient will meet greater than or equal to 90% of their needs  Progressing  MONITOR:   Vent status, Labs, Weight trends, TF tolerance, Skin, I & O's  REASON FOR ASSESSMENT:   Ventilator    ASSESSMENT:   51 year old female with PMHx of bipolar disorder, poor dentition, unexplained wt loss admitted after cardiac arrest. Found unconscious by husband who started CPR immediately. Intubated in ED on 05/19/22.  10/17 Admitted post arrest 10/18 Cortrak placed, TF initiated at 15 ml/hr 10/19 TF increased to 25 ml/hr 10/20 Off pressors  Pt remains on vent support; did not tolerate SBT today, daily trials  Tolerating Vital AF 1.2 at 25 ml/hr via Cortrak  No BM yet, abdomen soft, BS hypoactive  AKI with Rhabdo; current phos and potassium wdl. In rhabdomyolysis it is common for potassium and phosphorus levels to be high. Pt remains at high refeeding risk; It is possible that pt may present as a "late re-feeder" given this. Continue to monitor  electrolytes closely  Micronutrient Labs: CRP: 23.2 (H) Vitamin B12: 4289 (H) Folate B9: 6.0 (low normal)-continue supplementation Vitamin A: pending Vitamin C: pending Copper: pending Zinc: pending   Labs: potassium 4.4 (wdl), phosphorus 2.5 (wdl), magnesium wdl, sodium wdl, Creatinine 1.70, BUN 40 Meds: thiamine, folic acid, ss novolog, miralax, colace  Diet Order:   Diet Order             Diet NPO time specified  Diet effective now                   EDUCATION NEEDS:   Not appropriate for education at this time  Skin:  Skin Assessment: Skin Integrity Issues: Skin Integrity Issues:: DTI, Stage II, Stage III DTI: left lateral heel (2cm x 2cm); right lateral heel (1cm x 2cm) Stage II: sacrum (7cm x 10cm), vertebral column (3cm x 1cm), lower vertebral column (1cm x 1cm) Stage III: upper vertebral column (1cm x 1cm)  Last BM:  PTA  Height:   Ht Readings from Last 1 Encounters:  05/19/22 5\' 6"  (1.676 m)    Weight:   Wt Readings from Last 1 Encounters:  05/22/22 51.8 kg    Ideal Body Weight:  59.1 kg  BMI:  Body mass index is 18.43 kg/m.  Estimated Nutritional Needs:   Kcal:  1500-1750  Protein:  75-100 grams  Fluid:  1.5-1.75 L/day    Kerman Passey MS, RDN, LDN, CNSC Registered Dietitian 3 Clinical Nutrition RD Pager and On-Call Pager Number Located in [place]

## 2022-05-22 NOTE — Progress Notes (Addendum)
eLink Physician-Brief Progress Note Patient Name: Monica Hopkins DOB: 06-Oct-1970 MRN: [id number]   Date of Service  05/22/2022  HPI/Events of Note  RN reports pt's sats 90-92% on 60% FiO2 on the vent for most of the day.  CXR done earlier this morning unchanged  ABG 7.46/42/58 on PRVC 20/470/60%/5 PEEP Peak pressure 25, MV 9 Plateau pressure 20 Driving pressure 15 Not breathing over the set rate  eICU Interventions  Increase PEEP to 8 and decrease RR to 18 May increase PEEP further to 10, keep driving pressure <02 mmHg Will continue to monitor. Discussed with RT     Intervention Category Major Interventions: Hypoxemia - evaluation and management  Judd Lien 05/22/2022, 9:36 PM

## 2022-05-22 NOTE — Progress Notes (Signed)
NAME:  Monica Hopkins, MRN:  [id number], DOB:  11/12/1970, LOS: 3 ADMISSION DATE:  05/19/2022, CONSULTATION DATE:  05/19/22 REFERRING MD:  Billy Fischer, CHIEF COMPLAINT:  found down, cardiac arrest    History of Present Illness:  51 yo woman with hx of bipolar disorder,  here after cardiac arrest.   Per family has been feeling sick and taking trazodone and benadryl at night. They thought she had covid a couple of weeks ago, then recovered somewhat.  For past two weeks has been sob intermittently with some minimal cough, fevers, sore throat.  Her husband came home and she was alert and talking, he went to the bathroom.  10- min later he came back to find her unconscious.  He started CPR immediately, EMS came fairly quickly.  She was reportedly found to be in PEA.  Given 1 mg epi, also lidocaine.  Found once to be in shockable rhythm, shocked once.  Rosc after about 26 min, while in route.   Per family has undergone large weight loss  a few years ago, unclear cause.  Patient had felt it was from lithium but unclear when last takng this..  Poor po intake from low appeitite, nausea and vomiting.  She apparently has not wanted to go to visit the dr.  She recently has only done phone visits with psychiatry.   Sleeps sitting up in recliner.   Per her mother, she has not felt well in years, has complained of some virus or other acute illness affecting her for the past year.   Intubated in ED ( Had king airway in place) at 820 per drug record.  In ED hypothermic  ABG 7.206/49/82 Cefepime and vanc ordered.   AKI cr 1.2 from 0.79 WBC 14.3   Home meds klonipin, trazodone depakote.   Pertinent  Medical History  Bipolar disorder Unexplained weight loss  Poor dentition.  Significant Hospital Events: Including procedures, antibiotic start and stop dates in addition to other pertinent events   10/17: admitted post cardiac arrest; ROSC 26 minutes 10/20: off pressors, will awaken agitated, desaturates  SBT.   Interim History / Subjective:   Sedated on prop/fent; was swinging arms non purposefully per nurse  Objective   Blood pressure 110/62, pulse 72, temperature 98.8 F (37.1 C), resp. rate 13, height 5\' 6"  (1.676 m), weight 51.8 kg, SpO2 92 %.    Vent Mode: PRVC FiO2 (%):  [40 %] 40 % Set Rate:  [20 bmp] 20 bmp Vt Set:  [470 mL] 470 mL PEEP:  [5 cmH20] 5 cmH20 Plateau Pressure:  [14 cmH20-16 cmH20] 16 cmH20   Intake/Output Summary (Last 24 hours) at 05/22/2022 1021 Last data filed at 05/22/2022 0700 Gross per 24 hour  Intake 3228.64 ml  Output 490 ml  Net 2738.64 ml    Filed Weights   05/20/22 0600 05/21/22 0530 05/22/22 0500  Weight: 50 kg 50 kg 51.8 kg    Examination: General:  cachectic appearing female; critically ill appearing on mech vent HEENT: MM pink/moist; ETT in place; poor dentition Neuro: Currently sedated; Will thrash about non-purposefully. PERRL. Mouthing "aow" CV: s1s2, no m/r/g PULM:  dim clear BS bilaterally; on SBT she will take good Vt when calm but not consistently.  GI: soft, bsx4 active  Extremities: warm/dry, no edema  Skin: no rashes or lesions appreciated  Ancillary tests personally reviewed:   Creatinine up to 1.70  CK down to 5976 EEG showed no seizures.  Assessment & Plan:   Cardiac arrest: found to  be in PEA/shockable rhythm. Ultimate cause was likely respiratory from pneumococcal pneumonia.  - Favorable neurological recovery to this point.   Acute respiratory failure due to Strep pneumonia pneumonia -Continue daily SBT - Too agitated and desaturated on SBT today for extubation. Sedation modified to allow for softer wake up.   Acute encephalopathy UDS positive for THC; family states she took suboxone 10/17 - transition to enteral medications  - encephalopathy is multifactorial - some may be due to recent arrs  Diastolic HF: lvef 55%; grad 1 diastolic dysfunction; D-shaped septum indicating RV pressure/volume overload; mod  reduced RV function and mod elevated PA pressure P: -daily weights; strict I/O's -monitor UOP -hold on BB while hypotensive  Rhabdo Hematuria Hyperkalemia/Hypokalemia AKI P: -Stop IV fluids given RV dysfunction and decreasing creatinine. -trend ck -K/mag repleted this am; repeat bmp later today -Trend BMP / urinary output -Replace electrolytes as indicated -Avoid nephrotoxic agents, ensure adequate renal perfusion  Severe protein calorie malnutrition P: -diet consulted -advance TF slowly to avoid refeeding  Anxiety depression  P: -resume depakote and klonopin -start seroquel -hold trazodone   Best Practice (right click and "Reselect all SmartList Selections" daily)   Diet/type: tubefeeds and NPO w/ meds via tube DVT prophylaxis: heparin subq GI prophylaxis: PPI Lines: Arterial Line Foley:  yes Code Status:  full code Last date of multidisciplinary goals of care discussion [10/19 spoke with patient's husband at bedside and updated]  CRITICAL CARE Performed by: Lynnell Catalan   Total critical care time: 40 minutes  Critical care time was exclusive of separately billable procedures and treating other patients.  Critical care was necessary to treat or prevent imminent or life-threatening deterioration.  Critical care was time spent personally by me on the following activities: development of treatment plan with patient and/or surrogate as well as nursing, discussions with consultants, evaluation of patient's response to treatment, examination of patient, obtaining history from patient or surrogate, ordering and performing treatments and interventions, ordering and review of laboratory studies, ordering and review of radiographic studies, pulse oximetry, re-evaluation of patient's condition and participation in multidisciplinary rounds.  Lynnell Catalan, MD [facility] ICU Physician [facility]  Pager: [phone] Mobile: [phone] After hours:  [phone].

## 2022-05-22 NOTE — Progress Notes (Signed)
eLink Physician-Brief Progress Note Patient Name: Monica Hopkins DOB: September 13, 1970 MRN: [id number]   Date of Service  05/22/2022  HPI/Events of Note  Request for restraint renewal. On camera assessment patient remains intubated with risk of pulling lines and tubes.  eICU Interventions  Restraint renewed for tonight. Bedside team to assess in am for need for continued restraint     Intervention Category Minor Interventions: Other:  Judd Lien 05/22/2022, 11:02 PM

## 2022-05-23 DIAGNOSIS — J189 Pneumonia, unspecified organism: Secondary | ICD-10-CM

## 2022-05-23 DIAGNOSIS — J9601 Acute respiratory failure with hypoxia: Secondary | ICD-10-CM

## 2022-05-23 DIAGNOSIS — E43 Unspecified severe protein-calorie malnutrition: Secondary | ICD-10-CM

## 2022-05-23 LAB — VITAMIN C: Vitamin C: <0.1 mg/dL — ABNORMAL LOW (ref 0.4–2.0)

## 2022-05-23 LAB — POCT I-STAT 7, (LYTES, BLD GAS, ICA,H+H)
Acid-Base Excess: 3 mmol/L — ABNORMAL HIGH (ref 0.0–2.0)
Bicarbonate: 28.9 mmol/L — ABNORMAL HIGH (ref 20.0–28.0)
Calcium, Ion: 1.34 mmol/L (ref 1.15–1.40)
HCT: 30 % — ABNORMAL LOW (ref 36.0–46.0)
Hemoglobin: 10.2 g/dL — ABNORMAL LOW (ref 12.0–15.0)
O2 Saturation: 94 %
Patient temperature: 37
Potassium: 4.1 mmol/L (ref 3.5–5.1)
Sodium: 135 mmol/L (ref 135–145)
TCO2: 30 mmol/L (ref 22–32)
pCO2 arterial: 51.1 mmHg — ABNORMAL HIGH (ref 32–48)
pH, Arterial: 7.361 (ref 7.35–7.45)
pO2, Arterial: 73 mmHg — ABNORMAL LOW (ref 83–108)

## 2022-05-23 LAB — CK: Total CK: 1950 U/L — ABNORMAL HIGH (ref 38–234)

## 2022-05-23 LAB — GLUCOSE, CAPILLARY
Glucose-Capillary: 110 mg/dL — ABNORMAL HIGH (ref 70–99)
Glucose-Capillary: 117 mg/dL — ABNORMAL HIGH (ref 70–99)
Glucose-Capillary: 117 mg/dL — ABNORMAL HIGH (ref 70–99)
Glucose-Capillary: 118 mg/dL — ABNORMAL HIGH (ref 70–99)
Glucose-Capillary: 127 mg/dL — ABNORMAL HIGH (ref 70–99)
Glucose-Capillary: 92 mg/dL (ref 70–99)

## 2022-05-23 LAB — CULTURE, RESPIRATORY W GRAM STAIN: Culture: NORMAL

## 2022-05-23 LAB — COMPREHENSIVE METABOLIC PANEL
ALT: 209 U/L — ABNORMAL HIGH (ref 0–44)
AST: 278 U/L — ABNORMAL HIGH (ref 15–41)
Albumin: 1.6 g/dL — ABNORMAL LOW (ref 3.5–5.0)
Alkaline Phosphatase: 75 U/L (ref 38–126)
Anion gap: 13 (ref 5–15)
BUN: 55 mg/dL — ABNORMAL HIGH (ref 6–20)
CO2: 28 mmol/L (ref 22–32)
Calcium: 8.9 mg/dL (ref 8.9–10.3)
Chloride: 96 mmol/L — ABNORMAL LOW (ref 98–111)
Creatinine, Ser: 1.86 mg/dL — ABNORMAL HIGH (ref 0.44–1.00)
GFR, Estimated: 32 mL/min — ABNORMAL LOW (ref >60)
Glucose, Bld: 107 mg/dL — ABNORMAL HIGH (ref 70–99)
Potassium: 4.1 mmol/L (ref 3.5–5.1)
Sodium: 137 mmol/L (ref 135–145)
Total Bilirubin: 0.5 mg/dL (ref 0.3–1.2)
Total Protein: 3.8 g/dL — ABNORMAL LOW (ref 6.5–8.1)

## 2022-05-23 LAB — PHOSPHORUS
Phosphorus: 3.1 mg/dL (ref 2.5–4.6)
Phosphorus: 2.8 mg/dL (ref 2.5–4.6)

## 2022-05-23 LAB — CBC
HCT: 29.1 % — ABNORMAL LOW (ref 36.0–46.0)
Hemoglobin: 9.6 g/dL — ABNORMAL LOW (ref 12.0–15.0)
MCH: 30.4 pg (ref 26.0–34.0)
MCHC: 33 g/dL (ref 30.0–36.0)
MCV: 92.1 fL (ref 80.0–100.0)
Platelets: 117 10*3/uL — ABNORMAL LOW (ref 150–400)
RBC: 3.16 MIL/uL — ABNORMAL LOW (ref 3.87–5.11)
RDW: 14.6 % (ref 11.5–15.5)
WBC: 21.3 10*3/uL — ABNORMAL HIGH (ref 4.0–10.5)
nRBC: 0 % (ref 0.0–0.2)

## 2022-05-23 MED ORDER — FUROSEMIDE 10 MG/ML IJ SOLN
40.0000 mg | Freq: Once | INTRAMUSCULAR | Status: AC
  Administered 2022-05-23: 40 mg via INTRAVENOUS
  Filled 2022-05-23: qty 4

## 2022-05-23 NOTE — Progress Notes (Signed)
Assisted MD with bedside bronch due to patient desaturation with increased FIO2 and PEEP.

## 2022-05-23 NOTE — Procedures (Signed)
Bronchoscopy Procedure Note  Monica Hopkins  [id number]  24-Jul-1971  Date:05/23/22  Time:1:26 AM   Provider Performing:Rishav Rockefeller Rodman Pickle, MD  Procedure(s):  Flexible Bronchoscopy [id number]) and Initial Therapeutic Aspiration of Tracheobronchial Tree [phone])  Indication(s) Hypoxemia  Consent Risks of the procedure as well as the alternatives and risks of each were explained to the patient and/or caregiver.  Consent for the procedure was obtained and is signed in the bedside chart Family consented at bedside.  Anesthesia Fentanyl 100 mcg   Time Out Verified patient identification, verified procedure, site/side was marked, verified correct patient position, special equipment/implants available, medications/allergies/relevant history reviewed, required imaging and test results available.   Sterile Technique Usual hand hygiene, masks, gowns, and gloves were used   Procedure Description Bronchoscope advanced through endotracheal tube and into airway.  Airways were examined down to subsegmental level with findings noted below.   Following diagnostic evaluation, Therapeutic aspiration performed in all airways with scattered thin secretions which were suctioned and cleared  Findings: Thin white secretions scattered with some frothy secretions in subsegmental branches. Secretions suctioned and cleared from airway.   Complications/Tolerance None; patient tolerated the procedure well. Chest X-ray is needed post procedure.   EBL Minimal   Specimen(s) None

## 2022-05-23 NOTE — Progress Notes (Signed)
NAME:  Monica Hopkins, MRN:  [id number], DOB:  [birth year], LOS: 4 ADMISSION DATE:  05/19/2022, CONSULTATION DATE:  05/19/22 REFERRING MD:  Billy Fischer, CHIEF COMPLAINT:  found down, cardiac arrest    History of Present Illness:  51 yo woman with hx of bipolar disorder,  here after cardiac arrest.   Per family has been feeling sick and taking trazodone and benadryl at night. They thought she had covid a couple of weeks ago, then recovered somewhat.  For past two weeks has been sob intermittently with some minimal cough, fevers, sore throat.  Her husband came home and she was alert and talking, he went to the bathroom.  10- min later he came back to find her unconscious.  He started CPR immediately, EMS came fairly quickly.  She was reportedly found to be in PEA.  Given 1 mg epi, also lidocaine.  Found once to be in shockable rhythm, shocked once.  Rosc after about 26 min, while in route.   Per family has undergone large weight loss  a few years ago, unclear cause.  Patient had felt it was from lithium but unclear when last takng this..  Poor po intake from low appeitite, nausea and vomiting.  She apparently has not wanted to go to visit the dr.  She recently has only done phone visits with psychiatry.   Sleeps sitting up in recliner.   Per her mother, she has not felt well in years, has complained of some virus or other acute illness affecting her for the past year.   Intubated in ED ( Had king airway in place) at 820 per drug record.  In ED hypothermic  ABG 7.206/49/82 Cefepime and vanc ordered.   AKI cr 1.2 from 0.79 WBC 14.3   Home meds klonipin, trazodone depakote.   Pertinent  Medical History  Bipolar disorder Unexplained weight loss  Poor dentition.  Significant Hospital Events: Including procedures, antibiotic start and stop dates in addition to other pertinent events   10/17: admitted post cardiac arrest; ROSC 26 minutes 10/20: off pressors, will awaken agitated, desaturates  SBT.   Interim History / Subjective:  Patient started getting hypoxic yesterday with increasing oxygen requirement, FiO2 went up to 60% and PEEP up to 10 She was deeply sedated, had bronchoscopy  Objective   Blood pressure 110/62, pulse 69, temperature 98.4 F (36.9 C), temperature source Bladder, resp. rate 17, height 5\' 6"  (1.676 m), weight 54.9 kg, SpO2 96 %.    Vent Mode: PRVC FiO2 (%):  [40 %-100 %] 60 % Set Rate:  [18 bmp-20 bmp] 18 bmp Vt Set:  [410 mL-470 mL] 470 mL PEEP:  [5 cmH20-10 cmH20] 10 cmH20 Plateau Pressure:  [15 cmH20-19 cmH20] 19 cmH20   Intake/Output Summary (Last 24 hours) at 05/23/2022 6962 Last data filed at 05/23/2022 0800 Gross per 24 hour  Intake 1952.69 ml  Output 610 ml  Net 1342.69 ml   Filed Weights   05/21/22 0530 05/22/22 0500 05/23/22 0500  Weight: 50 kg 51.8 kg 54.9 kg    Examination:   Physical exam: General: Crtitically ill-appearing cachectic female, orally intubated HEENT: [place]/AT, eyes anicteric.  ETT and OGT in place Neuro: Deeply sedated with RASS -5, not following commands.  Eyes are closed.  Pupils 3 mm bilateral reactive to light Chest: Rhonchi heard in right lower lobe, otherwise clear Heart: Regular rate and rhythm, no murmurs or gallops Abdomen: Soft, nontender, nondistended, bowel sounds present Skin: No rash    Ancillary tests personally reviewed:  Creatinine up to 1.86  CK down 1950 EEG showed no seizures.  Assessment & Plan:  S/p cardiac arrest: found to be in PEA/shockable rhythm. Ultimate cause was likely respiratory from pneumococcal pneumonia.  Continue supportive care EEG showed no active seizures We will get MRI to rule out anoxic brain injury  Acute septic/toxic encephalopathy Decrease sedation with RASS goal -2 to -3 Precedex was stopped this morning Continue low-dose propofol and fentanyl  Acute hypoxic respiratory failure due to Strep pneumonia pneumonia Patient became hypoxic overnight Had urgent  bronchoscopy with aspiration of respiratory secretion Respiratory culture is growing gram-positive cocci in pairs consistent with pneumococcal pneumonia Continue IV ceftriaxone Continue lung protective ventilation Not ready for spontaneous breathing trial Titrate FiO2 and PEEP with O2 sat goal 92%  New diagnosis of acute HFpEF  Echo showed EF 55%; grad 1 diastolic dysfunction; D-shaped septum indicating RV pressure/volume overload; mod reduced RV function Continue gentle diuresis Monitor intake and output  Acute rhabdomyolysis Hyperkalemia/Hypokalemia, resolved AKI due to septic ATN Status trending down IV fluid has been stopped Monitor intake and output Avoid nephrotoxic agents  Severe protein calorie malnutrition Dietitian is following Continue tube feeds  Anxiety depression  Continue Depakote and Klonopin Hold trazodone Started on Seroquel  Best Practice (right click and "Reselect all SmartList Selections" daily)   Diet/type: Tube feeds DVT prophylaxis: heparin subq GI prophylaxis: PPI Lines: Arterial Line Foley:  yes Code Status:  full code Last date of multidisciplinary goals of care discussion [10/19 spoke with patient's husband at bedside and updated]    Total critical care time: 46 minutes  Performed by: Cheri Fowler   Critical care time was exclusive of separately billable procedures and treating other patients.   Critical care was necessary to treat or prevent imminent or life-threatening deterioration.   Critical care was time spent personally by me on the following activities: development of treatment plan with patient and/or surrogate as well as nursing, discussions with consultants, evaluation of patient's response to treatment, examination of patient, obtaining history from patient or surrogate, ordering and performing treatments and interventions, ordering and review of laboratory studies, ordering and review of radiographic studies, pulse oximetry and  re-evaluation of patient's condition.   Cheri Fowler, MD [facility] See Amion for pager If no response to pager, please call [phone] until 7pm After 7pm, Please call E-link [phone]

## 2022-05-24 ENCOUNTER — Inpatient Hospital Stay (HOSPITAL_COMMUNITY): Payer: Medicaid Other

## 2022-05-24 DIAGNOSIS — E872 Acidosis, unspecified: Secondary | ICD-10-CM

## 2022-05-24 LAB — COMPREHENSIVE METABOLIC PANEL
ALT: 184 U/L — ABNORMAL HIGH (ref 0–44)
AST: 197 U/L — ABNORMAL HIGH (ref 15–41)
Albumin: 1.5 g/dL — ABNORMAL LOW (ref 3.5–5.0)
Alkaline Phosphatase: 113 U/L (ref 38–126)
Anion gap: 11 (ref 5–15)
BUN: 58 mg/dL — ABNORMAL HIGH (ref 6–20)
CO2: 28 mmol/L (ref 22–32)
Calcium: 8.7 mg/dL — ABNORMAL LOW (ref 8.9–10.3)
Chloride: 98 mmol/L (ref 98–111)
Creatinine, Ser: 1.81 mg/dL — ABNORMAL HIGH (ref 0.44–1.00)
GFR, Estimated: 33 mL/min — ABNORMAL LOW (ref >60)
Glucose, Bld: 107 mg/dL — ABNORMAL HIGH (ref 70–99)
Potassium: 3.7 mmol/L (ref 3.5–5.1)
Sodium: 137 mmol/L (ref 135–145)
Total Bilirubin: 0.4 mg/dL (ref 0.3–1.2)
Total Protein: 4 g/dL — ABNORMAL LOW (ref 6.5–8.1)

## 2022-05-24 LAB — CK: Total CK: 699 U/L — ABNORMAL HIGH (ref 38–234)

## 2022-05-24 LAB — BLOOD GAS, ARTERIAL
Acid-Base Excess: 4.4 mmol/L — ABNORMAL HIGH (ref 0.0–2.0)
Bicarbonate: 30.8 mmol/L — ABNORMAL HIGH (ref 20.0–28.0)
Drawn by: 164
O2 Saturation: 100 %
Patient temperature: 37
pCO2 arterial: 52 mmHg — ABNORMAL HIGH (ref 32–48)
pH, Arterial: 7.38 (ref 7.35–7.45)
pO2, Arterial: 149 mmHg — ABNORMAL HIGH (ref 83–108)

## 2022-05-24 LAB — GLUCOSE, CAPILLARY
Glucose-Capillary: 113 mg/dL — ABNORMAL HIGH (ref 70–99)
Glucose-Capillary: 107 mg/dL — ABNORMAL HIGH (ref 70–99)
Glucose-Capillary: 96 mg/dL (ref 70–99)
Glucose-Capillary: 99 mg/dL (ref 70–99)

## 2022-05-24 LAB — CULTURE, BLOOD (ROUTINE X 2): Culture: NO GROWTH

## 2022-05-24 LAB — PHOSPHORUS: Phosphorus: 2.9 mg/dL (ref 2.5–4.6)

## 2022-05-24 LAB — TRIGLYCERIDES: Triglycerides: 244 mg/dL — ABNORMAL HIGH (ref <150)

## 2022-05-24 MED ORDER — PROPOFOL 1000 MG/100ML IV EMUL
5.0000 ug/kg/min | INTRAVENOUS | Status: DC
  Administered 2022-05-24 (×2): 40 ug/kg/min via INTRAVENOUS
  Administered 2022-05-25: 15 ug/kg/min via INTRAVENOUS
  Administered 2022-05-26: 35 ug/kg/min via INTRAVENOUS
  Administered 2022-05-26: 40 ug/kg/min via INTRAVENOUS
  Administered 2022-05-26 (×2): 35 ug/kg/min via INTRAVENOUS
  Administered 2022-05-27: 20 ug/kg/min via INTRAVENOUS
  Administered 2022-05-27 (×2): 30 ug/kg/min via INTRAVENOUS
  Administered 2022-05-28: 5 ug/kg/min via INTRAVENOUS
  Filled 2022-05-24 (×10): qty 100

## 2022-05-24 MED ORDER — MIDAZOLAM HCL 2 MG/2ML IJ SOLN
INTRAMUSCULAR | Status: AC
  Administered 2022-05-24: 2 mg via INTRAVENOUS
  Filled 2022-05-24: qty 2

## 2022-05-24 MED ORDER — FUROSEMIDE 10 MG/ML IJ SOLN
40.0000 mg | Freq: Once | INTRAMUSCULAR | Status: AC
  Administered 2022-05-24: 40 mg via INTRAVENOUS
  Filled 2022-05-24: qty 4

## 2022-05-24 MED ORDER — DEXMEDETOMIDINE HCL IN NACL 400 MCG/100ML IV SOLN
0.4000 ug/kg/h | INTRAVENOUS | Status: DC
  Administered 2022-05-24: 0.5 ug/kg/h via INTRAVENOUS
  Administered 2022-05-25: 0.6 ug/kg/h via INTRAVENOUS
  Administered 2022-05-25: 0.7 ug/kg/h via INTRAVENOUS
  Administered 2022-05-26: 0.5 ug/kg/h via INTRAVENOUS
  Administered 2022-05-27 (×3): 0.6 ug/kg/h via INTRAVENOUS
  Administered 2022-05-28: 1 ug/kg/h via INTRAVENOUS
  Administered 2022-05-28: 1.2 ug/kg/h via INTRAVENOUS
  Administered 2022-05-28: 0.8 ug/kg/h via INTRAVENOUS
  Administered 2022-05-29: 0.9 ug/kg/h via INTRAVENOUS
  Filled 2022-05-24 (×11): qty 100

## 2022-05-24 MED ORDER — POTASSIUM CHLORIDE 20 MEQ PO PACK
40.0000 meq | PACK | Freq: Once | ORAL | Status: AC
  Administered 2022-05-24: 40 meq
  Filled 2022-05-24: qty 2

## 2022-05-24 NOTE — Progress Notes (Signed)
NAME:  Monica Hopkins, MRN:  [id number], DOB:  Mar 13, 1971, LOS: 5 ADMISSION DATE:  05/19/2022, CONSULTATION DATE:  05/19/22 REFERRING MD:  Dalene Seltzer, CHIEF COMPLAINT:  found down, cardiac arrest    History of Present Illness:  51 yo woman with hx of bipolar disorder,  here after cardiac arrest.   Per family has been feeling sick and taking trazodone and benadryl at night. They thought she had covid a couple of weeks ago, then recovered somewhat.  For past two weeks has been sob intermittently with some minimal cough, fevers, sore throat.  Her husband came home and she was alert and talking, he went to the bathroom.  10- min later he came back to find her unconscious.  He started CPR immediately, EMS came fairly quickly.  She was reportedly found to be in PEA.  Given 1 mg epi, also lidocaine.  Found once to be in shockable rhythm, shocked once.  Rosc after about 26 min, while in route.   Per family has undergone large weight loss  a few years ago, unclear cause.  Patient had felt it was from lithium but unclear when last takng this..  Poor po intake from low appeitite, nausea and vomiting.  She apparently has not wanted to go to visit the dr.  She recently has only done phone visits with psychiatry.   Sleeps sitting up in recliner.   Per her mother, she has not felt well in years, has complained of some virus or other acute illness affecting her for the past year.   Intubated in ED ( Had king airway in place) at 820 per drug record.  In ED hypothermic  ABG 7.206/49/82 Cefepime and vanc ordered.   AKI cr 1.2 from 0.79 WBC 14.3   Home meds klonipin, trazodone depakote.   Pertinent  Medical History  Bipolar disorder Unexplained weight loss  Poor dentition.  Significant Hospital Events: Including procedures, antibiotic start and stop dates in addition to other pertinent events   10/17: admitted post cardiac arrest; ROSC 26 minutes 10/20: off pressors, will awaken agitated, desaturates  SBT.   Interim History / Subjective:  Patient remained agitated and restless Remained afebrile FiO2 was titrated down to 40% and PEEP of 10  Objective   Blood pressure 127/76, pulse (!) 111, temperature 97.9 F (36.6 C), resp. rate 18, height 5\' 6"  (1.676 m), weight 59.3 kg, SpO2 90 %.    Vent Mode: PRVC FiO2 (%):  [40 %-80 %] 40 % Set Rate:  [18 bmp] 18 bmp Vt Set:  [470 mL] 470 mL PEEP:  [10 cmH20] 10 cmH20 Plateau Pressure:  [19 cmH20-20 cmH20] 19 cmH20   Intake/Output Summary (Last 24 hours) at 05/24/2022 1105 Last data filed at 05/24/2022 0800 Gross per 24 hour  Intake 2325.29 ml  Output 1165 ml  Net 1160.29 ml   Filed Weights   05/22/22 0500 05/23/22 0500 05/24/22 0500  Weight: 51.8 kg 54.9 kg 59.3 kg    Examination: Physical exam: General: Crtitically ill-appearing cachectic female, orally intubated HEENT: [place]/AT, eyes anicteric.  ETT and OGT in place Neuro: Opens eyes with vocal stimuli, does not track examiner, not following commands, agitated and restless, moving all 4 extremities Chest: Rhonchi heard in right lower lobe, otherwise clear Heart: Regular rate and rhythm, no murmurs or gallops Abdomen: Soft, nontender, nondistended, bowel sounds present Skin: No rash   Ancillary tests personally reviewed:   Creatinine up to 1.81 CK down 699 EEG showed no seizures.  Assessment & Plan:  S/p cardiac arrest: found to be in PEA/shockable rhythm. Ultimate cause was likely respiratory from pneumococcal pneumonia.  Continue supportive care EEG showed no active seizures MRI brain showed no signs of anoxic brain injury  Acute septic/toxic encephalopathy Patient remained agitated and restless in the bed Continue propofol and fentanyl with RASS goal -1  Avoid deep sedation   Acute hypoxic respiratory failure due to Strep pneumonia pneumonia FiO2 was titrated down to 40%, PEEP remains at 8 Placed on pressure support trial this morning, tolerating well but mental  status precludes extubation Continue IV ceftriaxone Continue lung protective ventilation VAP bundle in place  New diagnosis of acute HFpEF  Echo showed EF 99%; grad 1 diastolic dysfunction; D-shaped septum indicating RV pressure/volume overload; mod reduced RV function Continue gentle diuresis Monitor intake and output  Acute rhabdomyolysis Hyperkalemia/Hypokalemia, resolved AKI due to septic ATN CK has trended down Monitor intake and output Avoid nephrotoxic agents  Severe protein calorie malnutrition Dietitian is following Continue tube feeds  Anxiety depression  Continue Depakote and Klonopin Continue Seroquel Hold trazodone and bupropion  Best Practice (right click and "Reselect all SmartList Selections" daily)   Diet/type: Tube feeds DVT prophylaxis: heparin subq GI prophylaxis: PPI Lines: Arterial Line Foley:  yes Code Status:  full code Last date of multidisciplinary goals of care discussion [10/22 spoke with patient's husband at bedside and updated]    Total critical care time: 41 minutes  Performed by: Jacky Kindle   Critical care time was exclusive of separately billable procedures and treating other patients.   Critical care was necessary to treat or prevent imminent or life-threatening deterioration.   Critical care was time spent personally by me on the following activities: development of treatment plan with patient and/or surrogate as well as nursing, discussions with consultants, evaluation of patient's response to treatment, examination of patient, obtaining history from patient or surrogate, ordering and performing treatments and interventions, ordering and review of laboratory studies, ordering and review of radiographic studies, pulse oximetry and re-evaluation of patient's condition.   Jacky Kindle, MD [facility] See Amion for pager If no response to pager, please call [phone] until 7pm After 7pm, Please call E-link  [phone]

## 2022-05-24 NOTE — Progress Notes (Signed)
eLink Physician-Brief Progress Note Patient Name: Monica Hopkins DOB: [birth year] MRN: [id number]   Date of Service  05/24/2022  HPI/Events of Note  Max on both fent (200) and prop (40). Already given versed and fentanyl pushes. Still agitated.   eICU Interventions  Added Precedex so as to facilitate SBT in AM     Intervention Category Minor Interventions: Agitation / anxiety - evaluation and management  Judd Lien 05/24/2022, 11:40 PM

## 2022-05-24 NOTE — Progress Notes (Signed)
[facility] ADULT ICU REPLACEMENT PROTOCOL   The patient does apply for the [facility] Adult ICU Electrolyte Replacment Protocol based on the criteria listed below:   1.Exclusion criteria: TCTS patients, ECMO patients, and Dialysis patients 2. Is GFR >/= 30 ml/min? Yes.    Patient's GFR today is 33 3. Is SCr </= 2? Yes.   Patient's SCr is 1.81 mg/dL 4. Did SCr increase >/= 0.5 in 24 hours? No. 5.Pt's weight >40kg  Yes.   6. Abnormal electrolyte(s): K  7. Electrolytes replaced per protocol 8.  Call MD STAT for K+ </= 2.5, Phos </= 1, or Mag </= 1 Physician:  Elisabeth Cara Telesforo Brosnahan 05/24/2022 6:30 AM

## 2022-05-24 NOTE — TOC Initial Note (Signed)
Transition of Care [facility]) - Initial/Assessment Note    Patient Details  Name: Monica Hopkins MRN: [id number] Date of Birth: 1971/04/11  Transition of Care [facility]) CM/SW Contact:    Bethena Roys, RN Phone Number: 05/24/2022, 2:29 PM  Clinical Narrative:  Patient presented after being found down and cardiac arrest.  PTA patient was from home with spouse.  Patient continues on the vent. Case Manager will continue to follow for transition of care needs.                Expected Discharge Plan:  (TBD) Barriers to Discharge: Continued Medical Work up  Expected Discharge Plan and Services Expected Discharge Plan:  (TBD)   Discharge Planning Services: CM Consult   Living arrangements for the past 2 months: [place]    Prior Living Arrangements/Services Living arrangements for the past 2 months: Single Family Home Lives with:: Spouse      Alcohol / Substance Use: Not Applicable Psych Involvement: No (comment)  Admission diagnosis:  Cardiac arrest [facility]) [I46.9] Patient Active Problem List   Diagnosis Date Noted   Pneumonia of both lungs due to infectious organism 05/21/2022   Acute respiratory failure ([place]) 05/21/2022   Pressure injury of skin 05/20/2022   Protein-calorie malnutrition, severe 05/20/2022   Cardiac arrest ([place]) 05/19/2022   AKI (acute kidney injury) ([place])    Urinary tract infection with hematuria    Sepsis ([place]) 08/16/2018   Bipolar 1 disorder ([place]) 08/16/2018   Cervical postlaminectomy syndrome 07/10/2015   Chronic pain syndrome 07/10/2015   PCP:  Leonard Downing, MD Pharmacy:   [id number] [place], [address] [place] [address] Phone: [phone] Fax: [phone]  [facility] [place], [address] [place] [address] Phone: [phone] Fax: [phone]   Readmission Risk  Interventions     No data to display

## 2022-05-25 ENCOUNTER — Inpatient Hospital Stay (HOSPITAL_COMMUNITY): Payer: Medicaid Other

## 2022-05-25 LAB — COMPREHENSIVE METABOLIC PANEL
ALT: 145 U/L — ABNORMAL HIGH (ref 0–44)
AST: 110 U/L — ABNORMAL HIGH (ref 15–41)
Albumin: <1.5 g/dL — ABNORMAL LOW (ref 3.5–5.0)
Alkaline Phosphatase: 92 U/L (ref 38–126)
Anion gap: 11 (ref 5–15)
BUN: 61 mg/dL — ABNORMAL HIGH (ref 6–20)
CO2: 28 mmol/L (ref 22–32)
Calcium: 8.8 mg/dL — ABNORMAL LOW (ref 8.9–10.3)
Chloride: 100 mmol/L (ref 98–111)
Creatinine, Ser: 1.99 mg/dL — ABNORMAL HIGH (ref 0.44–1.00)
GFR, Estimated: 30 mL/min — ABNORMAL LOW (ref >60)
Glucose, Bld: 139 mg/dL — ABNORMAL HIGH (ref 70–99)
Potassium: 4.6 mmol/L (ref 3.5–5.1)
Sodium: 139 mmol/L (ref 135–145)
Total Bilirubin: 0.5 mg/dL (ref 0.3–1.2)
Total Protein: 4.3 g/dL — ABNORMAL LOW (ref 6.5–8.1)

## 2022-05-25 LAB — CBC WITH DIFFERENTIAL/PLATELET
Abs Immature Granulocytes: 0 10*3/uL (ref 0.00–0.07)
Basophils Absolute: 0 10*3/uL (ref 0.0–0.1)
Basophils Relative: 0 %
Eosinophils Absolute: 0 10*3/uL (ref 0.0–0.5)
Eosinophils Relative: 0 %
HCT: 25.4 % — ABNORMAL LOW (ref 36.0–46.0)
Hemoglobin: 8.2 g/dL — ABNORMAL LOW (ref 12.0–15.0)
Lymphocytes Relative: 4 %
Lymphs Abs: 0.9 10*3/uL (ref 0.7–4.0)
MCH: 30.1 pg (ref 26.0–34.0)
MCHC: 32.3 g/dL (ref 30.0–36.0)
MCV: 93.4 fL (ref 80.0–100.0)
Monocytes Absolute: 0.9 10*3/uL (ref 0.1–1.0)
Monocytes Relative: 4 %
Neutro Abs: 20.5 10*3/uL — ABNORMAL HIGH (ref 1.7–7.7)
Neutrophils Relative %: 92 %
Platelets: 129 10*3/uL — ABNORMAL LOW (ref 150–400)
RBC: 2.72 MIL/uL — ABNORMAL LOW (ref 3.87–5.11)
RDW: 14.7 % (ref 11.5–15.5)
WBC: 22.3 10*3/uL — ABNORMAL HIGH (ref 4.0–10.5)
nRBC: 0 % (ref 0.0–0.2)
nRBC: 0 /100 WBC

## 2022-05-25 LAB — GLUCOSE, CAPILLARY
Glucose-Capillary: 103 mg/dL — ABNORMAL HIGH (ref 70–99)
Glucose-Capillary: 114 mg/dL — ABNORMAL HIGH (ref 70–99)
Glucose-Capillary: 119 mg/dL — ABNORMAL HIGH (ref 70–99)
Glucose-Capillary: 85 mg/dL (ref 70–99)
Glucose-Capillary: 99 mg/dL (ref 70–99)

## 2022-05-25 LAB — TRIGLYCERIDES: Triglycerides: 184 mg/dL — ABNORMAL HIGH (ref <150)

## 2022-05-25 LAB — POCT I-STAT 7, (LYTES, BLD GAS, ICA,H+H)
Acid-Base Excess: 4 mmol/L — ABNORMAL HIGH (ref 0.0–2.0)
Bicarbonate: 30 mmol/L — ABNORMAL HIGH (ref 20.0–28.0)
Calcium, Ion: 1.3 mmol/L (ref 1.15–1.40)
HCT: 23 % — ABNORMAL LOW (ref 36.0–46.0)
Hemoglobin: 7.8 g/dL — ABNORMAL LOW (ref 12.0–15.0)
O2 Saturation: 100 %
Potassium: 4.2 mmol/L (ref 3.5–5.1)
Sodium: 137 mmol/L (ref 135–145)
TCO2: 32 mmol/L (ref 22–32)
pCO2 arterial: 56.7 mmHg — ABNORMAL HIGH (ref 32–48)
pH, Arterial: 7.332 — ABNORMAL LOW (ref 7.35–7.45)
pO2, Arterial: 268 mmHg — ABNORMAL HIGH (ref 83–108)

## 2022-05-25 LAB — MAGNESIUM: Magnesium: 1.8 mg/dL (ref 1.7–2.4)

## 2022-05-25 LAB — AMMONIA: Ammonia: 34 umol/L (ref 9–35)

## 2022-05-25 MED ORDER — ETOMIDATE 2 MG/ML IV SOLN
INTRAVENOUS | Status: AC
  Administered 2022-05-25: 20 mg via INTRAVENOUS
  Filled 2022-05-25: qty 20

## 2022-05-25 MED ORDER — SODIUM CHLORIDE 0.9 % IV SOLN
250.0000 mL | INTRAVENOUS | Status: DC
  Administered 2022-05-25: 250 mL via INTRAVENOUS

## 2022-05-25 MED ORDER — NOREPINEPHRINE 4 MG/250ML-% IV SOLN
2.0000 ug/min | INTRAVENOUS | Status: DC
  Administered 2022-05-25: 2 ug/min via INTRAVENOUS
  Administered 2022-05-26: 6 ug/min via INTRAVENOUS
  Administered 2022-05-26: 1 ug/min via INTRAVENOUS
  Administered 2022-05-28 – 2022-05-29 (×2): 2 ug/min via INTRAVENOUS
  Filled 2022-05-25 (×2): qty 250

## 2022-05-25 MED ORDER — VITAMIN A 3 MG (10000 UNIT) PO CAPS
10000.0000 [IU] | ORAL_CAPSULE | Freq: Every day | ORAL | Status: DC
  Administered 2022-05-26 – 2022-06-25 (×22): 10000 [IU] via ORAL
  Filled 2022-05-25 (×32): qty 1

## 2022-05-25 MED ORDER — MAGNESIUM SULFATE 2 GM/50ML IV SOLN
2.0000 g | Freq: Once | INTRAVENOUS | Status: AC
  Administered 2022-05-25: 2 g via INTRAVENOUS
  Filled 2022-05-25: qty 50

## 2022-05-25 MED ORDER — KETAMINE HCL 50 MG/5ML IJ SOSY
PREFILLED_SYRINGE | INTRAMUSCULAR | Status: AC
  Filled 2022-05-25: qty 5

## 2022-05-25 MED ORDER — MIDAZOLAM HCL 2 MG/2ML IJ SOLN
INTRAMUSCULAR | Status: AC
  Filled 2022-05-25: qty 2

## 2022-05-25 MED ORDER — MIDAZOLAM HCL 2 MG/2ML IJ SOLN
2.0000 mg | Freq: Once | INTRAMUSCULAR | Status: AC
  Administered 2022-05-25: 2 mg via INTRAVENOUS
  Filled 2022-05-25: qty 2

## 2022-05-25 MED ORDER — FENTANYL CITRATE PF 50 MCG/ML IJ SOSY
100.0000 ug | PREFILLED_SYRINGE | Freq: Once | INTRAMUSCULAR | Status: AC
  Administered 2022-05-25: 100 ug via INTRAVENOUS

## 2022-05-25 MED ORDER — ROCURONIUM BROMIDE 50 MG/5ML IV SOLN: 80.0000 mg | Freq: Once | INTRAVENOUS | Status: AC

## 2022-05-25 MED ORDER — FENTANYL CITRATE PF 50 MCG/ML IJ SOSY
PREFILLED_SYRINGE | INTRAMUSCULAR | Status: AC
  Filled 2022-05-25: qty 2

## 2022-05-25 MED ORDER — ROCURONIUM BROMIDE 10 MG/ML (PF) SYRINGE
PREFILLED_SYRINGE | INTRAVENOUS | Status: AC
  Administered 2022-05-25: 80 mg via INTRAVENOUS
  Filled 2022-05-25: qty 10

## 2022-05-25 MED ORDER — SUCCINYLCHOLINE CHLORIDE 200 MG/10ML IV SOSY
PREFILLED_SYRINGE | INTRAVENOUS | Status: AC
  Filled 2022-05-25: qty 10

## 2022-05-25 MED ORDER — VITAMIN C 500 MG PO TABS
1000.0000 mg | ORAL_TABLET | Freq: Every day | ORAL | Status: AC
  Administered 2022-05-25 – 2022-05-31 (×7): 1000 mg
  Filled 2022-05-25 (×7): qty 2

## 2022-05-25 MED ORDER — ETOMIDATE 2 MG/ML IV SOLN: 20.0000 mg | Freq: Once | INTRAVENOUS | Status: AC

## 2022-05-25 MED FILL — Sodium Chloride IV Soln 0.9%: INTRAVENOUS | Qty: 200 | Status: AC

## 2022-05-25 MED FILL — Fentanyl Citrate Preservative Free (PF) Inj 2500 MCG/50ML: INTRAMUSCULAR | Qty: 50 | Status: AC

## 2022-05-25 NOTE — Progress Notes (Signed)
NAME:  Britteny Fiebelkorn, MRN:  [id number], DOB:  Apr 26, 1971, LOS: 6 ADMISSION DATE:  05/19/2022, CONSULTATION DATE:  05/19/22 REFERRING MD:  Billy Fischer, CHIEF COMPLAINT:  found down, cardiac arrest    History of Present Illness:  51 yo woman with hx of bipolar disorder,  here after cardiac arrest.   Per family has been feeling sick and taking trazodone and benadryl at night. They thought she had covid a couple of weeks ago, then recovered somewhat.  For past two weeks has been sob intermittently with some minimal cough, fevers, sore throat.  Her husband came home and she was alert and talking, he went to the bathroom.  10- min later he came back to find her unconscious.  He started CPR immediately, EMS came fairly quickly.  She was reportedly found to be in PEA.  Given 1 mg epi, also lidocaine.  Found once to be in shockable rhythm, shocked once.  Rosc after about 26 min, while in route.   Per family has undergone large weight loss  a few years ago, unclear cause.  Patient had felt it was from lithium but unclear when last takng this..  Poor po intake from low appeitite, nausea and vomiting.  She apparently has not wanted to go to visit the dr.  She recently has only done phone visits with psychiatry.   Sleeps sitting up in recliner.   Per her mother, she has not felt well in years, has complained of some virus or other acute illness affecting her for the past year.   Intubated in ED ( Had king airway in place) at 820 per drug record.  In ED hypothermic  ABG 7.206/49/82 Cefepime and vanc ordered.   AKI cr 1.2 from 0.79 WBC 14.3   Home meds klonipin, trazodone depakote.   Pertinent  Medical History  Bipolar disorder Unexplained weight loss  Poor dentition.  Significant Hospital Events: Including procedures, antibiotic start and stop dates in addition to other pertinent events   10/17: admitted post cardiac arrest; ROSC 26 minutes 10/20: off pressors, will awaken agitated, desaturates  SBT.   Interim History / Subjective:  Still encephalopathic on vent. MRI neg by read.  Objective   Blood pressure 127/76, pulse (!) 103, temperature 99 F (37.2 C), resp. rate (!) 22, height 5\' 6"  (1.676 m), weight 62.5 kg, SpO2 97 %.    Vent Mode: PSV;CPAP FiO2 (%):  [40 %-50 %] 40 % Set Rate:  [18 bmp] 18 bmp Vt Set:  [470 mL] 470 mL PEEP:  [8 cmH20] 8 cmH20 Pressure Support:  [8 cmH20-10 cmH20] 10 cmH20 Plateau Pressure:  [15 cmH20-22 cmH20] 15 cmH20   Intake/Output Summary (Last 24 hours) at 05/25/2022 0904 Last data filed at 05/25/2022 0800 Gross per 24 hour  Intake 2499.82 ml  Output 2175 ml  Net 324.82 ml    Filed Weights   05/23/22 0500 05/24/22 0500 05/25/22 0103  Weight: 54.9 kg 59.3 kg 62.5 kg    Examination: Shrugs shoulders roving gaze, good strength Lungs with rhonci bases Not following commands Strong cough, pupils equal Ext without edema Heart sounds tachy  CXR mild bibasilar airpsace disease  Ancillary tests personally reviewed:   Creatinine up to 1.86  CK down 1950 EEG showed no seizures.  Assessment & Plan:  S/p cardiac arrest: found to be in PEA/shockable rhythm. Ultimate cause was likely respiratory from pneumococcal pneumonia.  Acute hypoxic respiratory failure due to Strep pneumonia pneumonia + aspiration Acute septic/toxic/anoxic encephalopathy- ongoing issue, shouldn't be w/d,  getting  New diagnosis of acute HFpEF  Severe protein calorie malnutrition Acute rhabdomyolysis Hyperkalemia/Hypokalemia, resolved AKI due to septic ATN Anxiety depression bipolar PTA Rib fx from CPR  - DC oxycodone per tube - Continue seroquel/klonipin per tube - Wean to just precedex and give trial of extubation so we can really see where mental status is without pharmacologic overlay - Watch renal function, avoid further diuretics for now - Ceftriaxone x 7 days - If fails extubation and/or mental status fails to clear, will have neuro eval for LTVEEG -  Check ammonia  Best Practice (right click and "Reselect all SmartList Selections" daily)   Diet/type: Tube feeds DVT prophylaxis: heparin subq GI prophylaxis: PPI Lines: Arterial Line Foley:  yes Code Status:  full code Last date of multidisciplinary goals of care discussion [10/23 son updated at bedside]  34 min cc time Erskine Emery MD [place] for pager If no response to pager, please call [phone] until 7pm After 7pm, Please call E-link [phone]

## 2022-05-25 NOTE — Progress Notes (Signed)
[facility] ADULT ICU REPLACEMENT PROTOCOL   The patient does apply for the [facility] Adult ICU Electrolyte Replacment Protocol based on the criteria listed below:   1.Exclusion criteria: TCTS patients, ECMO patients, and Dialysis patients 2. Is GFR >/= 30 ml/min? Yes.    Patient's GFR today is 30 3. Is SCr </= 2? Yes.   Patient's SCr is 0.99 mg/dL 4. Did SCr increase >/= 0.5 in 24 hours? No. 5.Pt's weight >40kg  Yes.   6. Abnormal electrolyte(s): Mag  7. Electrolytes replaced per protocol 8.  Call MD STAT for K+ </= 2.5, Phos </= 1, or Mag </= 1 Physician:  Joette Catching Travius Crochet 05/25/2022 5:59 AM

## 2022-05-25 NOTE — Progress Notes (Signed)
05/25/2022 Failed extubation due to secretion burden and weak cough. She followed commands however x 4!  Reintubated and bronch'd, severe mucus plugging on R and copious purulent secretions.  Erskine Emery MD PCCM

## 2022-05-25 NOTE — Procedures (Signed)
Bronchoscopy Procedure Note  Kataya Guimont  [id number]  1971-06-14  Date:05/25/22  Time:9:52 AM   Provider Performing:Isidoro Santillana C Tamala Julian   Procedure(s):  Flexible bronchoscopy with bronchial alveolar lavage [phone]) and Initial Therapeutic Aspiration of Tracheobronchial Tree [phone])  Indication(s) Absent breath sounds R ?HCAP  Consent Son agrees verbally at bedside  Anesthesia In place for intubation   Time Out Verified patient identification, verified procedure, site/side was marked, verified correct patient position, special equipment/implants available, medications/allergies/relevant history reviewed, required imaging and test results available.   Sterile Technique Usual hand hygiene, masks, gowns, and gloves were used   Procedure Description Bronchoscope advanced through endotracheal tube and into airway.  Airways were examined down to subsegmental level with findings noted below.   Following diagnostic evaluation, Therapeutic aspiration performed in right mainstem down to RLL  Findings:  - Mucus plugging of bronchus intermedius, right mainstem, and RUL successfully suctioned - Copious mucopurulent secretions in bilateral lower lobes   Complications/Tolerance None; patient tolerated the procedure well. Chest X-ray is not needed post procedure.   EBL Minimal   Specimen(s) RLL BAL

## 2022-05-25 NOTE — Procedures (Signed)
Intubation Procedure Note  Dameshia Seybold  [id number]  12/30/1970  Date:05/25/22  Time:9:51 AM   Provider Performing:Trysten Bernard C Tamala Julian    Procedure: Intubation (31500)  Indication(s) Respiratory Failure  Consent Risks of the procedure as well as the alternatives and risks of each were explained to the patient and/or caregiver.  Consent for the procedure was obtained and is signed in the bedside chart   Anesthesia Etomidate   Time Out Verified patient identification, verified procedure, site/side was marked, verified correct patient position, special equipment/implants available, medications/allergies/relevant history reviewed, required imaging and test results available.   Sterile Technique Usual hand hygeine, masks, and gloves were used   Procedure Description Patient positioned in bed supine.  Sedation given as noted above.  Patient was intubated with endotracheal tube using Glidescope.  View was Grade 1 full glottis .  Number of attempts was 1.  Colorimetric CO2 detector was consistent with tracheal placement.   Complications/Tolerance None; patient tolerated the procedure well. Chest X-ray is ordered to verify placement.   EBL Minimal   Specimen(s) None

## 2022-05-25 NOTE — Progress Notes (Signed)
PT Cancellation Note  Patient Details Name: Monica Hopkins MRN: [id number] DOB: [birth year]   Cancelled Treatment:    Reason Eval/Treat Not Completed: Patient not medically ready (ordered post extubation but pt immediately reintubated. Will sign off and await new order when medically appropriate)   Janequa Kipnis B Cyler Kappes 05/25/2022, 10:19 AM [place] Office: [phone]

## 2022-05-25 NOTE — Progress Notes (Signed)
Nutrition Follow-up  DOCUMENTATION CODES:   Severe malnutrition in context of social or environmental circumstances, Underweight  INTERVENTION:   Tube Feeding via Cortrak:  Vital AF 1.2 at 55 ml/hr Provides 1584 kcals, 99 g of protein and 1069 mL of free water daily.  Continue thiamine, folic acid.  Begin vitamin A and vitamin C supplementation (discussed with CCM MD):   Vitamin A 10,000 International Units per tube once daily for 2 months.  Ascorbic acid 1,000 mg/day per tube once daily x 1 week, then 100 mg/day maintenance dose.    NUTRITION DIAGNOSIS:   Severe Malnutrition related to social / environmental circumstances (poor PO intake related to poor dentition after taking lithium) as evidenced by severe fat depletion, severe muscle depletion.  Ongoing  GOAL:   Patient will meet greater than or equal to 90% of their needs  Met with TF  MONITOR:   Vent status, Labs, Weight trends, TF tolerance, Skin, I & O's  REASON FOR ASSESSMENT:   Ventilator    ASSESSMENT:   51 year old female with PMHx of bipolar disorder, poor dentition, unexplained wt loss admitted after cardiac arrest. Found unconscious by husband who started CPR immediately. Intubated in ED on 05/19/22.  Patient was extubated this morning, but quickly required re-intubation. S/P bronchoscopy this morning. Found severe mucus plugging on R.   Tolerating Vital AF 1.2 at goal rate of 55 ml/hr via Cortrak. Patient is now having BMs, rectal tube in place.   Patient is currently intubated on ventilator support MV: 7.3 L/min Temp (24hrs), Avg:99.6 F (37.6 C), Min:99 F (37.2 C), Max:100.2 F (37.9 C)  Propofol: 4.5 ml/hr providing 119 kcal from lipid.   Labs reviewed. Potassium and phosphorus are WNL today. Potassium was low on 10/19, but has been WNL after supplementation.  CBG: 99-103  Micronutrient Labs: CRP: 23.2 (H) Vitamin B12: 4289 (H) Folate B9: 6.0 (low normal)-continue  supplementation Vitamin A: 3.7 (L) Vitamin C: < 0.1 (L) Copper: pending Zinc: pending  Will need to begin vitamin A and vitamin C supplementation. Okay to begin supplementation per discussion with CCM MD.   I/O net +8 L since admission. Patient with generalized mild pitting edema per RN assessment yesterday evening.   Diet Order:   Diet Order             Diet NPO time specified  Diet effective now                   EDUCATION NEEDS:   Not appropriate for education at this time  Skin:  Skin Assessment: Skin Integrity Issues: Skin Integrity Issues:: DTI, Stage II, Stage III DTI: left lateral heel (2cm x 2cm); right lateral heel (1cm x 2cm) Stage II: sacrum (7cm x 10cm), vertebral column (3cm x 1cm), lower vertebral column (1cm x 1cm) Stage III: upper vertebral column (1cm x 1cm)  Last BM:  10/23 type 7  Height:   Ht Readings from Last 1 Encounters:  05/19/22 $RemoveB'5\' 6"'MeDpSkqX$  (1.676 m)    Weight:   Wt Readings from Last 1 Encounters:  05/25/22 62.5 kg    Ideal Body Weight:  59.1 kg  BMI:  Body mass index is 22.24 kg/m.  Estimated Nutritional Needs:   Kcal:  1500-1750  Protein:  75-100 grams  Fluid:  1.5-1.75 L/day   Lucas Mallow RD, LDN, CNSC Please refer to Amion for contact information.

## 2022-05-25 NOTE — Procedures (Signed)
Central Venous Catheter Insertion Procedure Note  Monica Hopkins  [id number]  05/13/1971  Date:05/25/22  Time:3:40 PM   Provider Performing:Via Rosado Viona Gilmore Heber [place]   Procedure: Insertion of Non-tunneled Central Venous [phone]) with US guidance ([id number])   Indication(s) Medication administration  Consent Risks of the procedure as well as the alternatives and risks of each were explained to the patient and/or caregiver.  Consent for the procedure was obtained and is signed in the bedside chart  Anesthesia Topical only with 1% lidocaine   Timeout Verified patient identification, verified procedure, site/side was marked, verified correct patient position, special equipment/implants available, medications/allergies/relevant history reviewed, required imaging and test results available.  Sterile Technique Maximal sterile technique including full sterile barrier drape, hand hygiene, sterile gown, sterile gloves, mask, hair covering, sterile ultrasound probe cover (if used).  Procedure Description Area of catheter insertion was cleaned with chlorhexidine and draped in sterile fashion.  With real-time ultrasound guidance a central venous catheter was placed into the left internal jugular vein. Nonpulsatile blood flow and easy flushing noted in all ports.  The catheter was sutured in place and sterile dressing applied.  Complications/Tolerance None; patient tolerated the procedure well. Chest X-ray is ordered to verify placement for internal jugular or subclavian cannulation.   Chest x-ray is not ordered for femoral cannulation.  EBL Minimal  Specimen(s) None     Georgann Housekeeper, AGACNP-BC [facility]  See Amion for personal pager PCCM on call pager [phone] until 7pm. Please call Elink 7p-7a. [phone]  05/25/2022 3:40 PM

## 2022-05-25 NOTE — Procedures (Signed)
Extubation Procedure Note  Patient Details:   Name: Monica Hopkins DOB: 1971-07-22 MRN: [id number]   Airway Documentation:    Vent end date: 05/25/22 Vent end time: 0930   Evaluation  O2 sats:  desaturated to the 70s with HHFNC 100%/30L. Complications: Complications of desaturating  Patient did not tolerate procedure well. Bilateral Breath Sounds: Diminished   No   Pt was extubated per MD order and placed on HHFNC 100%/30L. Pt immediately desaturated in mid 80s with laborious breathing and the inability to properly cough out secretions. Pt was intubated by Dr. Tamala Julian and bronched. Pt is stable on the ventilator at this time.     Ronaldo Miyamoto 05/25/2022, 9:56 AM

## 2022-05-26 LAB — CBC WITH DIFFERENTIAL/PLATELET
Abs Immature Granulocytes: 1.12 10*3/uL — ABNORMAL HIGH (ref 0.00–0.07)
Basophils Absolute: 0.1 10*3/uL (ref 0.0–0.1)
Basophils Relative: 0 %
Eosinophils Absolute: 0.1 10*3/uL (ref 0.0–0.5)
Eosinophils Relative: 1 %
HCT: 21.3 % — ABNORMAL LOW (ref 36.0–46.0)
Hemoglobin: 7.2 g/dL — ABNORMAL LOW (ref 12.0–15.0)
Immature Granulocytes: 6 %
Lymphocytes Relative: 5 %
Lymphs Abs: 1 10*3/uL (ref 0.7–4.0)
MCH: 31 pg (ref 26.0–34.0)
MCHC: 33.8 g/dL (ref 30.0–36.0)
MCV: 91.8 fL (ref 80.0–100.0)
Monocytes Absolute: 0.4 10*3/uL (ref 0.1–1.0)
Monocytes Relative: 2 %
Neutro Abs: 15.7 10*3/uL — ABNORMAL HIGH (ref 1.7–7.7)
Neutrophils Relative %: 86 %
Platelets: 153 10*3/uL (ref 150–400)
RBC: 2.32 MIL/uL — ABNORMAL LOW (ref 3.87–5.11)
RDW: 14.7 % (ref 11.5–15.5)
WBC: 18.5 10*3/uL — ABNORMAL HIGH (ref 4.0–10.5)
nRBC: 0 % (ref 0.0–0.2)

## 2022-05-26 LAB — COMPREHENSIVE METABOLIC PANEL
ALT: 106 U/L — ABNORMAL HIGH (ref 0–44)
AST: 51 U/L — ABNORMAL HIGH (ref 15–41)
Albumin: <1.5 g/dL — ABNORMAL LOW (ref 3.5–5.0)
Alkaline Phosphatase: 81 U/L (ref 38–126)
Anion gap: 11 (ref 5–15)
BUN: 63 mg/dL — ABNORMAL HIGH (ref 6–20)
CO2: 28 mmol/L (ref 22–32)
Calcium: 9.1 mg/dL (ref 8.9–10.3)
Chloride: 101 mmol/L (ref 98–111)
Creatinine, Ser: 1.88 mg/dL — ABNORMAL HIGH (ref 0.44–1.00)
GFR, Estimated: 32 mL/min — ABNORMAL LOW (ref >60)
Glucose, Bld: 117 mg/dL — ABNORMAL HIGH (ref 70–99)
Potassium: 4.2 mmol/L (ref 3.5–5.1)
Sodium: 140 mmol/L (ref 135–145)
Total Bilirubin: 0.4 mg/dL (ref 0.3–1.2)
Total Protein: 4.2 g/dL — ABNORMAL LOW (ref 6.5–8.1)

## 2022-05-26 LAB — GLUCOSE, CAPILLARY
Glucose-Capillary: 126 mg/dL — ABNORMAL HIGH (ref 70–99)
Glucose-Capillary: 102 mg/dL — ABNORMAL HIGH (ref 70–99)
Glucose-Capillary: 123 mg/dL — ABNORMAL HIGH (ref 70–99)
Glucose-Capillary: 86 mg/dL (ref 70–99)
Glucose-Capillary: 108 mg/dL — ABNORMAL HIGH (ref 70–99)
Glucose-Capillary: 108 mg/dL — ABNORMAL HIGH (ref 70–99)
Glucose-Capillary: 103 mg/dL — ABNORMAL HIGH (ref 70–99)

## 2022-05-26 MED ORDER — FUROSEMIDE 10 MG/ML IJ SOLN
40.0000 mg | Freq: Two times a day (BID) | INTRAMUSCULAR | Status: DC
  Administered 2022-05-26 – 2022-05-28 (×5): 40 mg via INTRAVENOUS
  Filled 2022-05-26 (×5): qty 4

## 2022-05-26 NOTE — Progress Notes (Signed)
NAME:  Monica Hopkins, MRN:  [id number], DOB:  [birth year], LOS: 7 ADMISSION DATE:  05/19/2022, CONSULTATION DATE:  05/19/22 REFERRING MD:  Billy Fischer, CHIEF COMPLAINT:  found down, cardiac arrest    History of Present Illness:  51 yo woman with hx of bipolar disorder,  here after cardiac arrest.   Per family has been feeling sick and taking trazodone and benadryl at night. They thought she had covid a couple of weeks ago, then recovered somewhat.  For past two weeks has been sob intermittently with some minimal cough, fevers, sore throat.  Her husband came home and she was alert and talking, he went to the bathroom.  10- min later he came back to find her unconscious.  He started CPR immediately, EMS came fairly quickly.  She was reportedly found to be in PEA.  Given 1 mg epi, also lidocaine.  Found once to be in shockable rhythm, shocked once.  Rosc after about 26 min, while in route.   Per family has undergone large weight loss  a few years ago, unclear cause.  Patient had felt it was from lithium but unclear when last takng this..  Poor po intake from low appeitite, nausea and vomiting.  She apparently has not wanted to go to visit the dr.  She recently has only done phone visits with psychiatry.   Sleeps sitting up in recliner.   Per her mother, she has not felt well in years, has complained of some virus or other acute illness affecting her for the past year.   Intubated in ED ( Had king airway in place) at 820 per drug record.  In ED hypothermic  ABG 7.206/49/82 Cefepime and vanc ordered.   AKI cr 1.2 from 0.79 WBC 14.3   Home meds klonipin, trazodone depakote.   Pertinent  Medical History  Bipolar disorder Unexplained weight loss  Poor dentition.  Significant Hospital Events: Including procedures, antibiotic start and stop dates in addition to other pertinent events   10/17: admitted post cardiac arrest; ROSC 26 minutes 10/20: off pressors, will awaken agitated, desaturates  SBT.  10/23 failed extubation trial  Interim History / Subjective:  No events. Wakes up but restless, intermittent following commands.  Objective   Blood pressure 95/60, pulse 77, temperature 98.6 F (37 C), resp. rate (!) 22, height _0  (1.676 m), weight 58.1 kg, SpO2 96 %.    Vent Mode: PSV;CPAP FiO2 (%):  [40 %-100 %] 40 % Set Rate:  [18 bmp-22 bmp] 22 bmp Vt Set:  [470 mL] 470 mL PEEP:  [5 cmH20-8 cmH20] 5 cmH20 Pressure Support:  [8 cmH20] 8 cmH20 Plateau Pressure:  [14 cmH20-20 cmH20] 15 cmH20   Intake/Output Summary (Last 24 hours) at 05/26/2022 0826 Last data filed at 05/26/2022 0700 Gross per 24 hour  Intake 2458.89 ml  Output 1427 ml  Net 1031.89 ml    Filed Weights   05/24/22 0500 05/25/22 0103 05/26/22 0500  Weight: 59.3 kg 62.5 kg 58.1 kg    Examination: Still with choreaform movements Lungs sound better than yesterday Secretion burden also seems improved She has nearly no cough or gag reflex No edema Withdraws x 4, intermittent follow command  LFTs improved Albumin remains low Cr stable WBC improved No new chest imaging  Assessment & Plan:  S/p cardiac arrest: found to be in PEA/shockable rhythm. Ultimate cause was likely respiratory from pneumococcal pneumonia.  Acute hypoxic respiratory failure due to Strep pneumonia pneumonia + aspiration.  Did not tolerate extubation (10/23) due  to near absent ability to cough and heavy secretion burden.   Acute septic/toxic/anoxic encephalopathy- ongoing issue, shouldn't be w/d, followed commands during extubation trial.  Ammonia okay.  Spot EEG ok.  MRI okay. New diagnosis of acute HFpEF  Severe protein calorie malnutrition Hyperkalemia/Hypokalemia, resolved AKI due to septic ATN, rhabdo- improving, non-oliguric Anxiety depression bipolar PTA Rib fx from CPR  - Continue seroquel/klonipin per tube - Try to keep RASS 0 - Start standing lasix, watch renal function - Complete 7 days CTX, f/u BAL data -  May need a trach depending on family GOC and trajectory over next couple days  Best Practice (right click and "Reselect all SmartList Selections" daily)   Diet/type: Tube feeds DVT prophylaxis: heparin subq GI prophylaxis: PPI Lines: Arterial Line Foley:  yes Code Status:  full code Last date of multidisciplinary goals of care discussion [10/24 son updated at bedside]  35 min cc time Erskine Emery MD [facility] See Amion for pager If no response to pager, please call [phone] until 7pm After 7pm, Please call E-link [phone]

## 2022-05-26 NOTE — Progress Notes (Signed)
RT called to patient's room due to desat.  Patient on wean, agitated with sats in the mid 70's.  Patient placed back on full vent support, FIO2 increased to 100%, and patient ETT suctioned.  Sats improved to mid 80's.  Patient placed on recruitment maneuver PC 22, peep 8, FIO2 100%, iT 3.00, RR 10 for 2 minutes.  Sats improved 94%.  Patient placed back on previous PRVC settings with FIO2 of 100%. RN notified.

## 2022-05-26 NOTE — Progress Notes (Signed)
eLink Physician-Brief Progress Note Patient Name: Monica Hopkins DOB: 1971-04-18 MRN: [id number]   Date of Service  05/26/2022  HPI/Events of Note  Agitation - Nursing request to renew bilateral soft wrist restaints.   eICU Interventions  Will renew soft bilateral wrist restraints X 13 hours.      Intervention Category Major Interventions: Other:  Lysle Dingwall 05/26/2022, 7:49 PM

## 2022-05-27 ENCOUNTER — Inpatient Hospital Stay (HOSPITAL_COMMUNITY): Payer: Medicaid Other

## 2022-05-27 LAB — BASIC METABOLIC PANEL
Anion gap: 9 (ref 5–15)
BUN: 65 mg/dL — ABNORMAL HIGH (ref 6–20)
CO2: 29 mmol/L (ref 22–32)
Calcium: 9.1 mg/dL (ref 8.9–10.3)
Chloride: 102 mmol/L (ref 98–111)
Creatinine, Ser: 1.95 mg/dL — ABNORMAL HIGH (ref 0.44–1.00)
GFR, Estimated: 31 mL/min — ABNORMAL LOW (ref >60)
Glucose, Bld: 121 mg/dL — ABNORMAL HIGH (ref 70–99)
Potassium: 4.1 mmol/L (ref 3.5–5.1)
Sodium: 140 mmol/L (ref 135–145)

## 2022-05-27 LAB — CBC
HCT: 21.6 % — ABNORMAL LOW (ref 36.0–46.0)
Hemoglobin: 6.9 g/dL — CL (ref 12.0–15.0)
MCH: 30.3 pg (ref 26.0–34.0)
MCHC: 31.9 g/dL (ref 30.0–36.0)
MCV: 94.7 fL (ref 80.0–100.0)
Platelets: 224 10*3/uL (ref 150–400)
RBC: 2.28 MIL/uL — ABNORMAL LOW (ref 3.87–5.11)
RDW: 14.8 % (ref 11.5–15.5)
WBC: 21.6 10*3/uL — ABNORMAL HIGH (ref 4.0–10.5)
nRBC: 0 % (ref 0.0–0.2)

## 2022-05-27 LAB — GLUCOSE, CAPILLARY
Glucose-Capillary: 115 mg/dL — ABNORMAL HIGH (ref 70–99)
Glucose-Capillary: 113 mg/dL — ABNORMAL HIGH (ref 70–99)
Glucose-Capillary: 100 mg/dL — ABNORMAL HIGH (ref 70–99)
Glucose-Capillary: 112 mg/dL — ABNORMAL HIGH (ref 70–99)
Glucose-Capillary: 104 mg/dL — ABNORMAL HIGH (ref 70–99)
Glucose-Capillary: 103 mg/dL — ABNORMAL HIGH (ref 70–99)

## 2022-05-27 LAB — IRON AND TIBC
Iron: 35 ug/dL (ref 28–170)
Saturation Ratios: 31 % (ref 10.4–31.8)
TIBC: 113 ug/dL — ABNORMAL LOW (ref 250–450)
UIBC: 78 ug/dL

## 2022-05-27 LAB — TRIGLYCERIDES: Triglycerides: 143 mg/dL (ref <150)

## 2022-05-27 LAB — MAGNESIUM: Magnesium: 2.1 mg/dL (ref 1.7–2.4)

## 2022-05-27 LAB — ABO/RH: ABO/RH(D): O POS

## 2022-05-27 LAB — PREPARE RBC (CROSSMATCH)

## 2022-05-27 LAB — PROCALCITONIN: Procalcitonin: 1.75 ng/mL

## 2022-05-27 LAB — FERRITIN: Ferritin: 526 ng/mL — ABNORMAL HIGH (ref 11–307)

## 2022-05-27 LAB — MRSA NEXT GEN BY PCR, NASAL: MRSA by PCR Next Gen: NOT DETECTED

## 2022-05-27 MED ORDER — MIDAZOLAM HCL 2 MG/2ML IJ SOLN: 5.0000 mg | Freq: Once | INTRAMUSCULAR | Status: DC

## 2022-05-27 MED ORDER — ROCURONIUM BROMIDE 50 MG/5ML IV SOLN
100.0000 mg | Freq: Once | INTRAVENOUS | Status: AC
  Administered 2022-05-27: 50 mg via INTRAVENOUS
  Filled 2022-05-27: qty 10

## 2022-05-27 MED ORDER — LIDOCAINE-EPINEPHRINE 0.5 %-1:200000 IJ SOLN: 30.0000 mL | Freq: Once | INTRAMUSCULAR | Status: DC

## 2022-05-27 MED ORDER — ROCURONIUM BROMIDE 50 MG/5ML IV SOLN
100.0000 mg | Freq: Once | INTRAVENOUS | Status: DC
  Filled 2022-05-27: qty 10

## 2022-05-27 MED ORDER — LIDOCAINE-EPINEPHRINE (PF) 2 %-1:200000 IJ SOLN
INTRAMUSCULAR | Status: AC
  Administered 2022-05-27: 10 mL
  Filled 2022-05-27: qty 20

## 2022-05-27 MED ORDER — ETOMIDATE 2 MG/ML IV SOLN
20.0000 mg | Freq: Once | INTRAVENOUS | Status: AC
  Administered 2022-05-27: 20 mg via INTRAVENOUS
  Filled 2022-05-27: qty 10

## 2022-05-27 MED ORDER — PANTOPRAZOLE 2 MG/ML SUSPENSION
40.0000 mg | Freq: Two times a day (BID) | ORAL | Status: DC
  Administered 2022-05-27 – 2022-06-02 (×13): 40 mg
  Filled 2022-05-27 (×15): qty 20

## 2022-05-27 MED ORDER — VANCOMYCIN HCL 1250 MG/250ML IV SOLN
1250.0000 mg | INTRAVENOUS | Status: DC
  Administered 2022-05-27: 1250 mg via INTRAVENOUS
  Filled 2022-05-27: qty 250

## 2022-05-27 MED ORDER — VANCOMYCIN HCL IN DEXTROSE 1-5 GM/200ML-% IV SOLN
1000.0000 mg | Freq: Once | INTRAVENOUS | Status: DC
  Filled 2022-05-27: qty 200

## 2022-05-27 MED ORDER — MIDAZOLAM HCL 2 MG/2ML IJ SOLN
5.0000 mg | Freq: Once | INTRAMUSCULAR | Status: AC
  Administered 2022-05-27: 4 mg via INTRAVENOUS
  Filled 2022-05-27: qty 6

## 2022-05-27 MED ORDER — ALBUMIN HUMAN 25 % IV SOLN
25.0000 g | Freq: Four times a day (QID) | INTRAVENOUS | Status: AC
  Administered 2022-05-27 (×4): 25 g via INTRAVENOUS
  Filled 2022-05-27 (×4): qty 100

## 2022-05-27 MED ORDER — FENTANYL CITRATE PF 50 MCG/ML IJ SOSY: 200.0000 ug | PREFILLED_SYRINGE | Freq: Once | INTRAMUSCULAR | Status: DC

## 2022-05-27 MED ORDER — VANCOMYCIN HCL 1250 MG/250ML IV SOLN
1250.0000 mg | INTRAVENOUS | Status: DC
  Filled 2022-05-27: qty 250

## 2022-05-27 MED ORDER — ETOMIDATE 2 MG/ML IV SOLN: 20.0000 mg | Freq: Once | INTRAVENOUS | Status: DC

## 2022-05-27 MED ORDER — PIPERACILLIN-TAZOBACTAM 3.375 G IVPB
3.3750 g | Freq: Three times a day (TID) | INTRAVENOUS | Status: DC
  Administered 2022-05-27 – 2022-06-01 (×15): 3.375 g via INTRAVENOUS
  Filled 2022-05-27 (×15): qty 50

## 2022-05-27 MED ORDER — ROCURONIUM BROMIDE 10 MG/ML (PF) SYRINGE
PREFILLED_SYRINGE | INTRAVENOUS | Status: AC
  Filled 2022-05-27: qty 10

## 2022-05-27 MED ORDER — PIPERACILLIN-TAZOBACTAM 3.375 G IVPB 30 MIN
3.3750 g | Freq: Once | INTRAVENOUS | Status: AC
  Administered 2022-05-27: 3.375 g via INTRAVENOUS
  Filled 2022-05-27 (×2): qty 50

## 2022-05-27 MED ORDER — SODIUM CHLORIDE 0.9% IV SOLUTION: Freq: Once | INTRAVENOUS | Status: AC

## 2022-05-27 NOTE — Procedures (Signed)
Percutaneous Tracheostomy Procedure Note   Keila Turan  [id number]  09/04/1970  Date:05/27/22  Time:10:27 AM   Provider Performing:Pete E Kary Kos  Procedure: Percutaneous Tracheostomy with Bronchoscopic Guidance (77824)  Indication(s) Weaning and airway protection  Consent Risks of the procedure as well as the alternatives and risks of each were explained to the patient and/or caregiver.  Consent for the procedure was obtained.  Anesthesia Etomidate, Versed, Fentanyl, Vecuronium   Time Out Verified patient identification, verified procedure, site/side was marked, verified correct patient position, special equipment/implants available, medications/allergies/relevant history reviewed, required imaging and test results available.   Sterile Technique Maximal sterile technique including sterile barrier drape, hand hygiene, sterile gown, sterile gloves, mask, hair covering.    Procedure Description Appropriate anatomy identified by palpation.  Patient's neck prepped and draped in sterile fashion.  1% lidocaine with epinephrine was used to anesthetize skin overlying neck.  1.5cm incision made and blunt dissection performed until tracheal rings could be easily palpated.   Then a size 6 Shiley tracheostomy was placed under bronchoscopic visualization using usual Seldinger technique and serial dilation.   Bronchoscope confirmed placement above the carina.  Tracheostomy was sutured in place with adhesive pad to protect skin under pressure.    Patient connected to ventilator.   Complications/Tolerance None; patient tolerated the procedure well. Chest X-ray is ordered to confirm no post-procedural complication.   EBL Minimal   Specimen(s) None    Erick Colace ACNP-BC [facility] Pager # [phone] OR # [phone] if no answer

## 2022-05-27 NOTE — Procedures (Signed)
Bronchoscopy Procedure Note  Monica Hopkins  [id number]  Dec 27, 1970  Date:05/27/22  Time:10:23 AM   Provider Performing:Leaann Nevils C Tamala Julian   Procedure(s):  Subsequent Therapeutic Aspiration of Tracheobronchial Tree [phone])  Indication(s) Mucus plugging of bronchi Assist with tracheostomy placement  Consent Risks of the procedure as well as the alternatives and risks of each were explained to the patient and/or caregiver.  Consent for the procedure was obtained and is signed in the bedside chart  Anesthesia In place for tracheostomy   Time Out Verified patient identification, verified procedure, site/side was marked, verified correct patient position, special equipment/implants available, medications/allergies/relevant history reviewed, required imaging and test results available.   Sterile Technique Usual hand hygiene, masks, gowns, and gloves were used   Procedure Description Bronchoscope advanced through endotracheal tube and into airway.  Airways were examined down to subsegmental level with findings noted below.   Following diagnostic evaluation, Therapeutic aspiration performed in bilateral lower lobes .  Then direct visualization provided for tracheostomy placement by Marni Griffon NP (see separate note).   Findings:  - Complete occlusion of RLL and LLL due to thick mucopurulent secretions, underlying mucosa mildly irritated. - Trach midline, in good position, balloon entirely within tracheal lumen  Complications/Tolerance None; patient tolerated the procedure well. Chest X-ray is needed post procedure.   EBL Minimal   Specimen(s) None (BAL performed 2 days ago)

## 2022-05-27 NOTE — Progress Notes (Signed)
Pharmacy Antibiotic Note  Monica Hopkins is a 51 y.o. female admitted on 05/19/2022 s/p cardiac arrest. Pt was previously treated for pneumonia with ceftraixone. Pt remains on mechanical ventilation. During tracheostomy on 10/25, pt was noted to still have significant pneumonia. Pharmacy has been consulted for vancomycin and Zosyn dosing.   Remains with AKI, Scr high but stable at 1.95, last was reading prior to admit from 2020 was 0.80. WBC up to 21.6, pt was febrile to 101.3 on 10/26, and O2 requirement is increasing.   Plan: Start Zosyn 3.375g every 8 hours extended interval. First dose over 30 minutes. Give IV Vancomycin 1250 every 48 hours for eAUC 484 using wt 58 kg and Scr 1.95.  F/u procalcitonin and repeat MRSA PCR Follow culture data for de-escalation.  Monitor renal function for dose adjustments as indicated.   Height: 5\' 6"  (167.6 cm) Weight: 58 kg (127 lb 13.9 oz) IBW/kg (Calculated) : 59.3, Temp (24hrs), Avg:100.1 F (37.8 C), Min:99.1 F (37.3 C), Max:101.7 F (38.7 C)  Recent Labs  Lab 05/21/22 1041 05/22/22 0411 05/23/22 0416 05/24/22 0320 05/25/22 0407 05/26/22 0407 05/27/22 0343  WBC  --  25.3* 21.3*  --  22.3* 18.5* 21.6*  CREATININE  --  1.70* 1.86* 1.81* 1.99* 1.88* 1.95*  LATICACIDVEN 1.7  --   --   --   --   --   --      Estimated Creatinine Clearance: 31.3 mL/min (A) (by C-G formula based on SCr of 1.95 mg/dL (H)).    Allergies  Allergen Reactions   Abilify [Aripiprazole] Swelling and Rash   Lamictal [Lamotrigine] Swelling and Rash   Latuda [Lurasidone] Other (See Comments)    Caused leg weakness   Microbiology -10/23 Rcx: rare GPC, GPR, GNR -10/18 MRSA PCR neg -10/17 Bcx: NG  Thank you for allowing pharmacy to be a part of this patient's care.  Ursula Beath 05/27/2022 10:26 AM

## 2022-05-27 NOTE — Progress Notes (Signed)
SLP Cancellation Note  Patient Details Name: Monica Hopkins MRN: [id number] DOB: 13-May-1971   Cancelled treatment:        Patient with new tracheostomy. Orders for SLP eval and treat for PMSV and swallowing received. Will follow pt closely for readiness for SLP interventions as appropriate.     Celedonio Savage , Zeb, [place] Office: [phone] 05/27/2022, 11:14 AM

## 2022-05-27 NOTE — Progress Notes (Signed)
NAME:  Beckie Viscardi, MRN:  [id number], DOB:  1970/10/05, LOS: 8 ADMISSION DATE:  05/19/2022, CONSULTATION DATE:  05/19/22 REFERRING MD:  Billy Fischer, CHIEF COMPLAINT:  found down, cardiac arrest    History of Present Illness:  51 yo woman with hx of bipolar disorder,  here after cardiac arrest.   Per family has been feeling sick and taking trazodone and benadryl at night. They thought she had covid a couple of weeks ago, then recovered somewhat.  For past two weeks has been sob intermittently with some minimal cough, fevers, sore throat.  Her husband came home and she was alert and talking, he went to the bathroom.  10- min later he came back to find her unconscious.  He started CPR immediately, EMS came fairly quickly.  She was reportedly found to be in PEA.  Given 1 mg epi, also lidocaine.  Found once to be in shockable rhythm, shocked once.  Rosc after about 26 min, while in route.   Per family has undergone large weight loss  a few years ago, unclear cause.  Patient had felt it was from lithium but unclear when last takng this..  Poor po intake from low appeitite, nausea and vomiting.  She apparently has not wanted to go to visit the dr.  She recently has only done phone visits with psychiatry.   Sleeps sitting up in recliner.   Per her mother, she has not felt well in years, has complained of some virus or other acute illness affecting her for the past year.   Intubated in ED ( Had king airway in place) at 820 per drug record.  In ED hypothermic  ABG 7.206/49/82 Cefepime and vanc ordered.   AKI cr 1.2 from 0.79 WBC 14.3   Home meds klonipin, trazodone depakote.   Pertinent  Medical History  Bipolar disorder Unexplained weight loss  Poor dentition.  Significant Hospital Events: Including procedures, antibiotic start and stop dates in addition to other pertinent events   10/17: admitted post cardiac arrest; ROSC 26 minutes 10/20: off pressors, will awaken agitated, desaturates  SBT.  10/23 failed extubation trial  Interim History / Subjective:  No events. For trach today. Low grade fevers, WBC still up.  Objective   Blood pressure (!) 151/76, pulse 94, temperature 99.3 F (37.4 C), resp. rate (!) 21, height _0  (1.676 m), weight 58 kg, SpO2 90 %.    Vent Mode: PRVC FiO2 (%):  [40 %-100 %] 60 % Set Rate:  [22 bmp] 22 bmp Vt Set:  [470 mL] 470 mL PEEP:  [5 cmH20-8 cmH20] 8 cmH20 Pressure Support:  [8 cmH20] 8 cmH20 Plateau Pressure:  [15 WPY09-98 cmH20] 21 cmH20   Intake/Output Summary (Last 24 hours) at 05/27/2022 0804 Last data filed at 05/27/2022 0600 Gross per 24 hour  Intake 2299.18 ml  Output 2575 ml  Net -275.82 ml    Filed Weights   05/25/22 0103 05/26/22 0500 05/27/22 0349  Weight: 62.5 kg 58.1 kg 58 kg    Examination: Still with choreaform movements particularly of shoulder and tongue Ongoing rhonci, similar to yesterday, triggering vent Secretion burden mild-moderate thick She has nearly no cough or gag reflex, stable No edema Withdraws x 4, intermittent follow command, stable  H/H a little down, getting transfusion Stable AKI Leukocytosis stable  Assessment & Plan:  S/p cardiac arrest: found to be in PEA/shockable rhythm. Ultimate cause was likely respiratory from pneumococcal pneumonia.  Acute hypoxic respiratory failure due to Strep pneumonia pneumonia + aspiration.  Did not tolerate extubation (10/23) due to near absent ability to cough and heavy secretion burden.   Acute septic/toxic/anoxic encephalopathy- ongoing issue, shouldn't be w/d, followed commands during extubation trial.  Ammonia okay.  Spot EEG ok.  MRI okay. New diagnosis of acute HFpEF  Severe protein calorie malnutrition Hyperkalemia/Hypokalemia, resolved AKI due to septic ATN, rhabdo- improving, non-oliguric; renal US okay Anxiety depression bipolar PTA Rib fx from CPR Slowly dropping H/H- unclear cause, will keep an eye on, no s/s of bleeding  -  Continue seroquel/klonipin per tube - Try to keep RASS 0 - Standing lasix, add albumin to facilitate fluid mobilization - 1 unit pRBC, look for any s/s of bleeding;  PPI to BID - f/u BAL data, trach today, if bronch still heavy secretion burden will start HCAP coverage given ongoing fevers, high O2 needs, and leukocytosis  Best Practice (right click and "Reselect all SmartList Selections" daily)   Diet/type: Tube feeds DVT prophylaxis: heparin subq GI prophylaxis: PPI Lines: Arterial Line Foley:  yes Code Status:  full code Last date of multidisciplinary goals of care discussion [10/24 son updated at bedside]  37 min cc time Erskine Emery MD [facility] See Amion for pager If no response to pager, please call [phone] until 7pm After 7pm, Please call E-link [phone]

## 2022-05-27 NOTE — Progress Notes (Signed)
Orders placed for 1 unit of PRBC by Dr. Oletta Darter. Attempted to call patient husband Akeria Hedstrom for consent at this time but no answer and no voicemail. Will try again prior to shift end.

## 2022-05-27 NOTE — Progress Notes (Signed)
[facility] Progress Note Patient Name: Areyanna Figeroa DOB: [birth year] MRN: [id number]   Date of Service  05/27/2022  HPI/Events of Note  Anemia - Hgb = 6.9.  eICU Interventions  Will transfuse 1 unit PRBC.     Intervention Category Major Interventions: Other:  Prather Failla Cornelia Copa 05/27/2022, 4:32 AM

## 2022-05-28 LAB — TYPE AND SCREEN
ABO/RH(D): O POS
Antibody Screen: NEGATIVE
Unit division: 0

## 2022-05-28 LAB — CBC
HCT: 23.3 % — ABNORMAL LOW (ref 36.0–46.0)
Hemoglobin: 8 g/dL — ABNORMAL LOW (ref 12.0–15.0)
MCH: 31.3 pg (ref 26.0–34.0)
MCHC: 34.3 g/dL (ref 30.0–36.0)
MCV: 91 fL (ref 80.0–100.0)
Platelets: 261 10*3/uL (ref 150–400)
RBC: 2.56 MIL/uL — ABNORMAL LOW (ref 3.87–5.11)
RDW: 14.4 % (ref 11.5–15.5)
WBC: 18.8 10*3/uL — ABNORMAL HIGH (ref 4.0–10.5)
nRBC: 0 % (ref 0.0–0.2)

## 2022-05-28 LAB — CULTURE, RESPIRATORY W GRAM STAIN

## 2022-05-28 LAB — BPAM RBC
Blood Product Expiration Date: 202310312359
ISSUE DATE / TIME: 202310250754
Unit Type and Rh: 5100

## 2022-05-28 LAB — BASIC METABOLIC PANEL
Anion gap: 13 (ref 5–15)
BUN: 73 mg/dL — ABNORMAL HIGH (ref 6–20)
CO2: 27 mmol/L (ref 22–32)
Calcium: 9.7 mg/dL (ref 8.9–10.3)
Chloride: 100 mmol/L (ref 98–111)
Creatinine, Ser: 1.96 mg/dL — ABNORMAL HIGH (ref 0.44–1.00)
GFR, Estimated: 30 mL/min — ABNORMAL LOW (ref >60)
Glucose, Bld: 101 mg/dL — ABNORMAL HIGH (ref 70–99)
Potassium: 3.5 mmol/L (ref 3.5–5.1)
Sodium: 140 mmol/L (ref 135–145)

## 2022-05-28 LAB — MAGNESIUM: Magnesium: 2 mg/dL (ref 1.7–2.4)

## 2022-05-28 LAB — COPPER, SERUM: Copper: 111 ug/dL (ref 80–158)

## 2022-05-28 LAB — GLUCOSE, CAPILLARY
Glucose-Capillary: 106 mg/dL — ABNORMAL HIGH (ref 70–99)
Glucose-Capillary: 114 mg/dL — ABNORMAL HIGH (ref 70–99)
Glucose-Capillary: 120 mg/dL — ABNORMAL HIGH (ref 70–99)
Glucose-Capillary: 131 mg/dL — ABNORMAL HIGH (ref 70–99)
Glucose-Capillary: 151 mg/dL — ABNORMAL HIGH (ref 70–99)
Glucose-Capillary: 85 mg/dL (ref 70–99)

## 2022-05-28 LAB — ZINC: Zinc: 46 ug/dL (ref 44–115)

## 2022-05-28 MED ORDER — POTASSIUM CHLORIDE 20 MEQ PO PACK
40.0000 meq | PACK | Freq: Once | ORAL | Status: AC
  Administered 2022-05-28: 40 meq
  Filled 2022-05-28: qty 2

## 2022-05-28 MED ORDER — OXYCODONE HCL 5 MG PO TABS: 5.0000 mg | ORAL_TABLET | Freq: Four times a day (QID) | ORAL | Status: DC | PRN

## 2022-05-28 MED ORDER — OXYCODONE HCL 5 MG PO TABS
5.0000 mg | ORAL_TABLET | Freq: Four times a day (QID) | ORAL | Status: DC
  Administered 2022-05-28 – 2022-05-29 (×3): 5 mg
  Filled 2022-05-28 (×3): qty 1

## 2022-05-28 MED ORDER — OXYCODONE HCL 5 MG PO TABS
5.0000 mg | ORAL_TABLET | Freq: Four times a day (QID) | ORAL | Status: DC
  Administered 2022-05-28: 5 mg via ORAL
  Filled 2022-05-28: qty 1

## 2022-05-28 NOTE — Progress Notes (Signed)
Pt placed on ATC 10L 60% by RT. Pt tolerated for 60mins then pt was placed back on full vent support due to tachypnea, increased WOB and desat to 87%. Pt tolerating full vent support well at this time, vitals stable, Spo2 100%, RN at bedside, CCM aware, RT will monitor.

## 2022-05-28 NOTE — Progress Notes (Signed)
[facility] ADULT ICU REPLACEMENT PROTOCOL   The patient does apply for the [facility] Adult ICU Electrolyte Replacment Protocol based on the criteria listed below:   1.Exclusion criteria: TCTS patients, ECMO patients, and Dialysis patients 2. Is GFR >/= 30 ml/min? Yes.    Patient's GFR today is 30 3. Is SCr </= 2? Yes.   Patient's SCr is 1.96 mg/dL 4. Did SCr increase >/= 0.5 in 24 hours? No. 5.Pt's weight >40kg  Yes.   6. Abnormal electrolyte(s):   K 3.5  7. Electrolytes replaced per protocol 8.  Call MD STAT for K+ </= 2.5, Phos </= 1, or Mag </= 1 Physician:  S. Rosie Fate R Monserat Prestigiacomo 05/28/2022 5:24 AM

## 2022-05-28 NOTE — Progress Notes (Signed)
PT Cancellation Note  Patient Details Name: Monica Hopkins MRN: [id number] DOB: 09-02-1970   Cancelled Treatment:    Reason Eval/Treat Not Completed: Fatigue/lethargy limiting ability to participate. Per RN, poor tolerance to ATC trial with increased WOB and anxiety requiring medication, now lethargic with poor ability to participate. Will follow-up tomorrow for PT Evaluation.  Mabeline Caras, PT, DPT Acute Rehabilitation Services  Personal: [facility] Office: [place] 05/28/2022, 3:59 PM

## 2022-05-28 NOTE — TOC Progression Note (Signed)
Transition of Care [facility]) - Progression Note    Patient Details  Name: Monica Hopkins MRN: [id number] Date of Birth: 04-Jul-1971  Transition of Care [facility]) CM/SW Contact  Graves-Bigelow, Ocie Cornfield, RN Phone Number: 05/28/2022, 3:09 PM  Clinical Narrative: Patient was discussed in morning rounds-post trach 05/27/22. Patient continues on full vent support. Case Manager will continue to follow for transition of care needs.      Expected Discharge Plan:  (TBD) Barriers to Discharge: Continued Medical Work up  Expected Discharge Plan and Services Expected Discharge Plan:  (TBD)   Discharge Planning Services: CM Consult   Living arrangements for the past 2 months: Single Family Home                 Readmission Risk Interventions     No data to display

## 2022-05-28 NOTE — Progress Notes (Signed)
SLP Cancellation Note  Patient Details Name: Keyonta Madrid MRN: [id number] DOB: 08-28-1970   Cancelled treatment:       Reason Eval/Treat Not Completed: Patient not medically ready. With new trach and on vent. Per chart, pt to try to start weaning trials but secretion burden may limit this. Will continue to follow for now.     Osie Bond., M.A. Deputy Office [phone]  Secure chat preferred  05/28/2022, 9:59 AM

## 2022-05-28 NOTE — Progress Notes (Signed)
NAME:  Monica Hopkins, MRN:  [id number], DOB:  July 30, 1971, LOS: 9 ADMISSION DATE:  05/19/2022, CONSULTATION DATE:  05/19/22 REFERRING MD:  Billy Fischer, CHIEF COMPLAINT:  found down, cardiac arrest    History of Present Illness:  51 yo woman with hx of bipolar disorder,  here after cardiac arrest.   Per family has been feeling sick and taking trazodone and benadryl at night. They thought she had covid a couple of weeks ago, then recovered somewhat.  For past two weeks has been sob intermittently with some minimal cough, fevers, sore throat.  Her husband came home and she was alert and talking, he went to the bathroom.  10- min later he came back to find her unconscious.  He started CPR immediately, EMS came fairly quickly.  She was reportedly found to be in PEA.  Given 1 mg epi, also lidocaine.  Found once to be in shockable rhythm, shocked once.  Rosc after about 26 min, while in route.   Per family has undergone large weight loss  a few years ago, unclear cause.  Patient had felt it was from lithium but unclear when last takng this..  Poor po intake from low appeitite, nausea and vomiting.  She apparently has not wanted to go to visit the dr.  She recently has only done phone visits with psychiatry.   Sleeps sitting up in recliner.   Per her mother, she has not felt well in years, has complained of some virus or other acute illness affecting her for the past year.   Intubated in ED ( Had king airway in place) at 820 per drug record.  In ED hypothermic  ABG 7.206/49/82 Cefepime and vanc ordered.   AKI cr 1.2 from 0.79 WBC 14.3   Home meds klonipin, trazodone depakote.   Pertinent  Medical History  Bipolar disorder Unexplained weight loss  Poor dentition.  Significant Hospital Events: Including procedures, antibiotic start and stop dates in addition to other pertinent events   10/17: admitted post cardiac arrest; ROSC 26 minutes 10/20: off pressors, will awaken agitated, desaturates  SBT.  10/23 failed extubation trial 10/25 trach  Interim History / Subjective:  No events. More awake C/o ill defined pain.  Objective   Blood pressure (!) 151/76, pulse 98, temperature 100.2 F (37.9 C), resp. rate (!) 22, height 5\' 6"  (1.676 m), weight 58.1 kg, SpO2 98 %.    Vent Mode: PRVC FiO2 (%):  [60 %] 60 % Set Rate:  [22 bmp] 22 bmp Vt Set:  [470 mL] 470 mL PEEP:  [8 cmH20] 8 cmH20 Plateau Pressure:  [22 cmH20] 22 cmH20   Intake/Output Summary (Last 24 hours) at 05/28/2022 0826 Last data filed at 05/28/2022 0600 Gross per 24 hour  Intake 3219.84 ml  Output 2720 ml  Net 499.84 ml    Filed Weights   05/26/22 0500 05/27/22 0349 05/28/22 0438  Weight: 58.1 kg 58 kg 58.1 kg    Examination: Choreiform movements have stopped Follows commands x 4 Muscle wasting noted Small thick secretions from trach Abd soft, +BS  H/H a little down, getting transfusion Stable AKI Leukocytosis better Improved H/H after transfusion  Assessment & Plan:  S/p cardiac arrest: found to be in PEA/shockable rhythm. Ultimate cause was likely respiratory from pneumococcal pneumonia.  Acute hypoxic respiratory failure due to Strep pneumonia pneumonia + aspiration.  Did not tolerate extubation (10/23) due to near absent ability to cough and heavy secretion burden.  GNR in sputum so question HCAP.  On  cefepime, MRSA PCR neg so will stop vanc. Acute septic/toxic/anoxic encephalopathy- improving slowly New diagnosis of acute HFpEF  Severe protein calorie malnutrition Hyperkalemia/Hypokalemia, resolved AKI due to septic ATN, rhabdo- stable, non-oliguric; renal US okay Anxiety depression bipolar PTA Rib fx from CPR Slowly dropping H/H- s/p 1 unit pRBC 10/25  - Continue seroquel/klonipin/depakene per tube; start standing oxycodone to wean from opiate drip - Standing lasix, add albumin to facilitate fluid mobilization - Hold lasix today - Watch H/H - Start PS trials, potentially TC but  doubt her secretion burden will allow this  Best Practice (right click and "Reselect all SmartList Selections" daily)   Diet/type: Tube feeds DVT prophylaxis: heparin subq GI prophylaxis: PPI Lines: Arterial Line Foley:  yes Code Status:  full code Last date of multidisciplinary goals of care discussion [10/25 son updated at bedside]  32 min cc time Myrla Halsted MD PCCM [facility] See Amion for pager If no response to pager, please call [phone] until 7pm After 7pm, Please call E-link [phone]

## 2022-05-29 LAB — BASIC METABOLIC PANEL
Anion gap: 12 (ref 5–15)
BUN: 76 mg/dL — ABNORMAL HIGH (ref 6–20)
CO2: 28 mmol/L (ref 22–32)
Calcium: 9.7 mg/dL (ref 8.9–10.3)
Chloride: 102 mmol/L (ref 98–111)
Creatinine, Ser: 1.93 mg/dL — ABNORMAL HIGH (ref 0.44–1.00)
GFR, Estimated: 31 mL/min — ABNORMAL LOW (ref >60)
Glucose, Bld: 92 mg/dL (ref 70–99)
Potassium: 3.6 mmol/L (ref 3.5–5.1)
Sodium: 142 mmol/L (ref 135–145)

## 2022-05-29 LAB — CBC
HCT: 24.2 % — ABNORMAL LOW (ref 36.0–46.0)
Hemoglobin: 8.1 g/dL — ABNORMAL LOW (ref 12.0–15.0)
MCH: 31.3 pg (ref 26.0–34.0)
MCHC: 33.5 g/dL (ref 30.0–36.0)
MCV: 93.4 fL (ref 80.0–100.0)
Platelets: 344 10*3/uL (ref 150–400)
RBC: 2.59 MIL/uL — ABNORMAL LOW (ref 3.87–5.11)
RDW: 14.6 % (ref 11.5–15.5)
WBC: 25 10*3/uL — ABNORMAL HIGH (ref 4.0–10.5)
nRBC: 0 % (ref 0.0–0.2)

## 2022-05-29 LAB — GLUCOSE, CAPILLARY
Glucose-Capillary: 137 mg/dL — ABNORMAL HIGH (ref 70–99)
Glucose-Capillary: 133 mg/dL — ABNORMAL HIGH (ref 70–99)
Glucose-Capillary: 110 mg/dL — ABNORMAL HIGH (ref 70–99)
Glucose-Capillary: 113 mg/dL — ABNORMAL HIGH (ref 70–99)
Glucose-Capillary: 115 mg/dL — ABNORMAL HIGH (ref 70–99)
Glucose-Capillary: 152 mg/dL — ABNORMAL HIGH (ref 70–99)

## 2022-05-29 LAB — MAGNESIUM: Magnesium: 1.8 mg/dL (ref 1.7–2.4)

## 2022-05-29 MED ORDER — HYDROMORPHONE HCL 1 MG/ML IJ SOLN
0.5000 mg | INTRAMUSCULAR | Status: DC | PRN
  Administered 2022-05-29 (×2): 1 mg via INTRAVENOUS
  Administered 2022-05-29: 2 mg via INTRAVENOUS
  Administered 2022-05-29: 1 mg via INTRAVENOUS
  Administered 2022-05-30: 1.5 mg via INTRAVENOUS
  Administered 2022-05-30 – 2022-05-31 (×3): 1 mg via INTRAVENOUS
  Filled 2022-05-29: qty 2
  Filled 2022-05-29 (×2): qty 1
  Filled 2022-05-29: qty 2
  Filled 2022-05-29: qty 1
  Filled 2022-05-29: qty 2
  Filled 2022-05-29 (×2): qty 1

## 2022-05-29 MED ORDER — ACETAMINOPHEN 160 MG/5ML PO SOLN
650.0000 mg | Freq: Four times a day (QID) | ORAL | Status: DC
  Administered 2022-05-29 – 2022-06-23 (×91): 650 mg
  Filled 2022-05-29 (×94): qty 20.3

## 2022-05-29 MED ORDER — ACETAMINOPHEN 160 MG/5ML PO SOLN: 500.0000 mg | Freq: Four times a day (QID) | ORAL | Status: DC

## 2022-05-29 MED ORDER — OXYCODONE HCL 5 MG PO TABS
10.0000 mg | ORAL_TABLET | Freq: Four times a day (QID) | ORAL | Status: DC
  Administered 2022-05-29 – 2022-05-30 (×4): 10 mg
  Filled 2022-05-29 (×4): qty 2

## 2022-05-29 MED ORDER — HYDROMORPHONE HCL 1 MG/ML IJ SOLN: 0.5000 mg | INTRAMUSCULAR | Status: DC | PRN

## 2022-05-29 MED ORDER — ACETAMINOPHEN 160 MG/5ML PO SOLN
650.0000 mg | Freq: Four times a day (QID) | ORAL | Status: DC
  Filled 2022-05-29: qty 20.3

## 2022-05-29 MED ORDER — CLONAZEPAM 1 MG PO TABS
2.0000 mg | ORAL_TABLET | Freq: Three times a day (TID) | ORAL | Status: DC
  Administered 2022-05-29 – 2022-06-02 (×12): 2 mg
  Filled 2022-05-29 (×12): qty 2

## 2022-05-29 NOTE — Evaluation (Signed)
Physical Therapy Evaluation Patient Details Name: Monica Hopkins MRN: [id number] DOB: November 26, 1970 Today's Date: 05/29/2022  History of Present Illness  51 yo admitted 10/17 with cardiac arrest at home with ROSC 26 min. Intubated 10/17, failed extubation 10/23. Trach 10/25. New HFpEF. PMhx: bipolar disorder  Clinical Impression  Pt pleasant, engaged in session and joking with therapists. Pt PTA was independent living with spouse without DME. Pt currently with poor trunk and neck control with assist for all transfers and balance, delayed processing and response to commands and +2 physical assist for all mobility with limited standing trials. Pt will benefit from acute therapy to maximize mobility, safety and function to decrease burden of care. Pt encouraged to work on active neck and trunk control during waking hours. Spouse reports she sleeps with head flopped over with education for both of need for increased control prior to mobility progression.     PS/CPAP 50% FiO2, peep 8, SpO2 98% HR 108      Recommendations for follow up therapy are one component of a multi-disciplinary discharge planning process, led by the attending physician.  Recommendations may be updated based on patient status, additional functional criteria and insurance authorization.  Follow Up Recommendations PT at Long-term acute care hospital      Assistance Recommended at Discharge Frequent or constant Supervision/Assistance  Patient can return home with the following  Two people to help with walking and/or transfers;Two people to help with bathing/dressing/bathroom;Direct supervision/assist for medications management;Help with stairs or ramp for entrance;Assist for transportation;Assistance with cooking/housework;Direct supervision/assist for financial management;Assistance with feeding    Equipment Recommendations Wheelchair (measurements PT);Hospital bed;Wheelchair cushion (measurements PT)  Recommendations for Other  Services       Functional Status Assessment Patient has had a recent decline in their functional status and demonstrates the ability to make significant improvements in function in a reasonable and predictable amount of time.     Precautions / Restrictions Precautions Precautions: Fall;Other (comment) Precaution Comments: flexiseal, cortrak, trach, vent      Mobility  Bed Mobility Overal bed mobility: Needs Assistance Bed Mobility: Supine to Sit, Sit to Supine     Supine to sit: Max assist, +2 for physical assistance Sit to supine: Total assist   General bed mobility comments: utilized foot egress positioning of bed with max +2 to shift fully into unsupported sitting and scoot to eOB. Total for supine to sit with bed positioning and total +2 to slide toward HOB. In sitting max assist for trunk conrol with periods of engagement and LOB in all directions    Transfers Overall transfer level: Needs assistance   Transfers: Sit to/from Stand Sit to Stand: Max assist, +2 physical assistance           General transfer comment: x 2 trials with support of pad at sacrum, bil knees blocked, physical assist for anterior translation and power up with bil UE support on therapsists    Ambulation/Gait               General Gait Details: unable  Stairs            Wheelchair Mobility    Modified Rankin (Stroke Patients Only)       Balance Overall balance assessment: Needs assistance Sitting-balance support: No upper extremity supported, Bilateral upper extremity supported, Feet supported Sitting balance-Leahy Scale: Zero Sitting balance - Comments: with and without bil UE support with variation from mod- max assist for trunk control with LOB in all directions Postural control: Posterior lean, Right  lateral lean, Left lateral lean Standing balance support: Bilateral upper extremity supported Standing balance-Leahy Scale: Zero Standing balance comment: +2 max for  control and balance                             Pertinent Vitals/Pain Pain Assessment Pain Assessment: No/denies pain    Home Living Family/patient expects to be discharged to:: Private residence Living Arrangements: Spouse/significant other Available Help at Discharge: Family;Available 24 hours/day Type of Home: House Home Access: Stairs to enter   [employer] of Steps: 3   Home Layout: One level Home Equipment: None      Prior Function Prior Level of Function : Independent/Modified Independent                     Hand Dominance        Extremity/Trunk Assessment   Upper Extremity Assessment Upper Extremity Assessment: Defer to OT evaluation    Lower Extremity Assessment Lower Extremity Assessment: Generalized weakness;RLE deficits/detail;LLE deficits/detail RLE Deficits / Details: grossly 2+/5 pt with delay in response to motor commands with pt able to lift legs and tolerate standing trials LLE Deficits / Details: grossly 2+/5 pt with delay in response to motor commands with pt able to lift legs and tolerate standing trials    Cervical / Trunk Assessment Cervical / Trunk Assessment: Other exceptions Cervical / Trunk Exceptions: poor trunk and cervical control with tendency for right neck rotation with lateral flexion and extension.  Communication   Communication: Tracheostomy  Cognition Arousal/Alertness: Awake/alert Behavior During Therapy: WFL for tasks assessed/performed Overall Cognitive Status: Impaired/Different from baseline Area of Impairment: Memory, Following commands, Safety/judgement, Awareness, Attention, Problem solving                   Current Attention Level: Sustained Memory: Decreased short-term memory Following Commands: Follows one step commands with increased time Safety/Judgement: Decreased awareness of deficits, Decreased awareness of safety   Problem Solving: Slow processing, Decreased initiation,  Requires verbal cues, Requires tactile cues          General Comments      Exercises     Assessment/Plan    PT Assessment Patient needs continued PT services  PT Problem List Decreased strength;Decreased mobility;Decreased safety awareness;Decreased range of motion;Decreased activity tolerance;Decreased cognition;Decreased balance;Decreased knowledge of use of DME;Cardiopulmonary status limiting activity;Decreased coordination       PT Treatment Interventions DME instruction;Therapeutic exercise;Gait training;Balance training;Neuromuscular re-education;Functional mobility training;Cognitive remediation;Therapeutic activities;Patient/family education    PT Goals (Current goals can be found in the Care Plan section)  Acute Rehab PT Goals Patient Stated Goal: be able to walk PT Goal Formulation: With patient/family Time For Goal Achievement: 06/12/22 Potential to Achieve Goals: Fair    Frequency Min 3X/week     Co-evaluation PT/OT/SLP Co-Evaluation/Treatment: Yes Reason for Co-Treatment: Complexity of the patient's impairments (multi-system involvement);For patient/therapist safety PT goals addressed during session: Mobility/safety with mobility;Strengthening/ROM;Balance         AM-PAC PT "6 Clicks" Mobility  Outcome Measure Help needed turning from your back to your side while in a flat bed without using bedrails?: Total Help needed moving from lying on your back to sitting on the side of a flat bed without using bedrails?: Total Help needed moving to and from a bed to a chair (including a wheelchair)?: Total Help needed standing up from a chair using your arms (e.g., wheelchair or bedside chair)?: Total Help needed to walk in hospital  room?: Total Help needed climbing 3-5 steps with a railing? : Total 6 Click Score: 6    End of Session   Activity Tolerance: Patient tolerated treatment well Patient left: in bed;with call bell/phone within reach;with family/visitor  present;with restraints reapplied Nurse Communication: Mobility status PT Visit Diagnosis: Other abnormalities of gait and mobility (R26.89);Muscle weakness (generalized) (M62.81);Unsteadiness on feet (R26.81);Other symptoms and signs involving the nervous system (R29.898)    Time: 1438-8875 PT Time Calculation (min) (ACUTE ONLY): 36 min   Charges:   PT Evaluation $PT Eval High Complexity: 1 High          Kristoff Coonradt P, PT Acute Rehabilitation Services Office: [phone]   Monica Hopkins 05/29/2022, 12:54 PM

## 2022-05-29 NOTE — Progress Notes (Signed)
Pt placed on ATC 10 60% by RT. Pt tolerated for 15 mins. Then pt was back on vent in PSV/CPAP due to pt desat. to 88%. Pt tolerating well at this time SpO2 97% vitals stable, no increased WOB noted, RN at bedside, MD notified, RT will monitor.

## 2022-05-29 NOTE — Evaluation (Signed)
Occupational Therapy Evaluation Patient Details Name: Monica Hopkins MRN: [id number] DOB: 09-12-1970 Today's Date: 05/29/2022   History of Present Illness 51 yo admitted 10/17 with cardiac arrest at home with ROSC 26 min. Intubated 10/17, failed extubation 10/23. Trach 10/25. New HFpEF. PMhx: bipolar disorder   Clinical Impression   Pt was independent prior to admission. Presents with significant weakness, decreased head and trunk control, impaired cognition and dependence in all ADLs. She requires +2 physical assist for bed level mobility and standing x 2. She remains vent dependent via trach, but demonstrated stable VS throughout session. Pt will need intensive rehab upon discharge, will follow acutely.      Recommendations for follow up therapy are one component of a multi-disciplinary discharge planning process, led by the attending physician.  Recommendations may be updated based on patient status, additional functional criteria and insurance authorization.   Follow Up Recommendations  OT at Long-term acute care hospital    Assistance Recommended at Discharge Frequent or constant Supervision/Assistance  Patient can return home with the following Two people to help with walking and/or transfers;Two people to help with bathing/dressing/bathroom    Functional Status Assessment  Patient has had a recent decline in their functional status and demonstrates the ability to make significant improvements in function in a reasonable and predictable amount of time.  Equipment Recommendations  Other (comment) (defer to next venue)    Recommendations for Other Services       Precautions / Restrictions Precautions Precautions: Fall;Other (comment) Precaution Comments: flexiseal, cortrak, trach, vent Restrictions Weight Bearing Restrictions: No      Mobility Bed Mobility Overal bed mobility: Needs Assistance Bed Mobility: Supine to Sit, Sit to Supine     Supine to sit: Max assist, +2  for physical assistance Sit to supine: Total assist   General bed mobility comments: utilized foot egress positioning of bed with max +2 to shift fully into unsupported sitting and scoot to eOB. Total for supine to sit with bed positioning and total +2 to slide toward HOB. In sitting max assist for trunk conrol with periods of engagement and LOB in all directions    Transfers Overall transfer level: Needs assistance Equipment used: 2 person hand held assist Transfers: Sit to/from Stand Sit to Stand: Max assist, +2 physical assistance           General transfer comment: x 2 trials with support of pad at sacrum, bil knees blocked, physical assist for anterior translation and power up with bil UE support on therapsists      Balance Overall balance assessment: Needs assistance Sitting-balance support: No upper extremity supported, Bilateral upper extremity supported, Feet supported Sitting balance-Leahy Scale: Zero Sitting balance - Comments: with and without bil UE support with variation from mod- max assist for trunk control with LOB in all directions Postural control: Posterior lean, Right lateral lean, Left lateral lean Standing balance support: Bilateral upper extremity supported Standing balance-Leahy Scale: Zero Standing balance comment: +2 max for control and balance                           ADL either performed or assessed with clinical judgement   ADL                                         General ADL Comments: Currently NPO and dependent in self care.  Vision Baseline Vision/History: 1 Wears glasses Patient Visual Report: No change from baseline       Perception     Praxis      Pertinent Vitals/Pain Pain Assessment Pain Assessment: Faces Faces Pain Scale: Hurts a little bit Pain Location: buttocks Pain Descriptors / Indicators: Discomfort Pain Intervention(s): Repositioned     Hand Dominance Right   Extremity/Trunk  Assessment Upper Extremity Assessment Upper Extremity Assessment: RUE deficits/detail;LUE deficits/detail RUE Deficits / Details: 2/5 shoulder, 3/5 elbow, 4/5 gross grip RUE Coordination: decreased fine motor;decreased gross motor LUE Deficits / Details: 2/5 shoulder, 3/5 elbow, 3+/5 gross grip LUE Coordination: decreased fine motor;decreased gross motor   Lower Extremity Assessment Lower Extremity Assessment: Defer to PT evaluation   Cervical / Trunk Assessment Cervical / Trunk Assessment: Other exceptions;Kyphotic Cervical / Trunk Exceptions: poor trunk and cervical control with tendency for right neck rotation with lateral flexion and extension, hx of neck sx   Communication Communication Communication: Tracheostomy   Cognition Arousal/Alertness: Awake/alert Behavior During Therapy: WFL for tasks assessed/performed Overall Cognitive Status: Impaired/Different from baseline Area of Impairment: Memory, Following commands, Safety/judgement, Awareness, Attention, Problem solving                   Current Attention Level: Sustained Memory: Decreased short-term memory Following Commands: Follows one step commands with increased time Safety/Judgement: Decreased awareness of deficits, Decreased awareness of safety   Problem Solving: Slow processing, Decreased initiation, Requires verbal cues, Requires tactile cues       General Comments       Exercises     Shoulder Instructions      Home Living Family/patient expects to be discharged to:: Private residence Living Arrangements: Spouse/significant other Available Help at Discharge: Family;Available 24 hours/day Type of Home: House Home Access: Stairs to enter [employer] of Steps: 3   Home Layout: One level     Bathroom Shower/Tub: Chief Strategy Officer: Standard     Home Equipment: None          Prior Functioning/Environment Prior Level of Function : Independent/Modified  Independent                        OT Problem List: Decreased strength;Decreased activity tolerance;Impaired balance (sitting and/or standing);Decreased cognition;Decreased coordination;Decreased safety awareness;Decreased knowledge of use of DME or AE;Cardiopulmonary status limiting activity      OT Treatment/Interventions: Self-care/ADL training;DME and/or AE instruction;Therapeutic activities;Cognitive remediation/compensation;Patient/family education;Balance training    OT Goals(Current goals can be found in the care plan section) Acute Rehab OT Goals OT Goal Formulation: With patient Time For Goal Achievement: 06/12/22 Potential to Achieve Goals: Good ADL Goals Pt Will Perform Upper Body Dressing: with min assist;sitting Pt Will Transfer to Toilet: with mod assist;stand pivot transfer;bedside commode Pt/caregiver will Perform Home Exercise Program: Both right and left upper extremity;Increased strength;With minimal assist (A/AROM) Additional ADL Goal #1: Pt will demonstrate fair sitting balance at EOB. Additional ADL Goal #2: Pt will demonstrate selective attention.  OT Frequency: Min 2X/week    Co-evaluation PT/OT/SLP Co-Evaluation/Treatment: Yes Reason for Co-Treatment: Complexity of the patient's impairments (multi-system involvement);For patient/therapist safety PT goals addressed during session: Mobility/safety with mobility;Strengthening/ROM;Balance OT goals addressed during session: Strengthening/ROM      AM-PAC OT "6 Clicks" Daily Activity     Outcome Measure Help from another person eating meals?: Total Help from another person taking care of personal grooming?: Total Help from another person toileting, which includes using toliet, bedpan, or urinal?: Total  Help from another person bathing (including washing, rinsing, drying)?: Total Help from another person to put on and taking off regular upper body clothing?: Total Help from another person to put on and  taking off regular lower body clothing?: Total 6 Click Score: 6   End of Session Nurse Communication: Mobility status  Activity Tolerance: Patient tolerated treatment well Patient left: in bed;with family/visitor present;with call bell/phone within reach  OT Visit Diagnosis: Unsteadiness on feet (R26.81);Other abnormalities of gait and mobility (R26.89);Muscle weakness (generalized) (M62.81);Other symptoms and signs involving cognitive function;Pain                Time: 3267-1245 OT Time Calculation (min): 35 min Charges:  OT General Charges $OT Visit: 1 Visit OT Evaluation $OT Eval High Complexity: 1 High  Berna Spare, OTR/L Acute Rehabilitation Services Office: [phone]   Evern Bio 05/29/2022, 1:28 PM

## 2022-05-29 NOTE — Progress Notes (Signed)
Ice packs applied to bilateral underarms, groin and behind the neck to aid in cooling the patient. No complaints by the patient. Explained purpose and need to the patient. Patient has no questions.

## 2022-05-29 NOTE — TOC CM/SW Note (Signed)
Recommendation made for LTAC, pt does not have payor for LTAC. Will continue to follow for dc needs. [place], Heart Failure TOC CM [phone]

## 2022-05-29 NOTE — Progress Notes (Signed)
NAME:  Monica Hopkins, MRN:  [id number], DOB:  [birth year], LOS: 10 ADMISSION DATE:  05/19/2022, CONSULTATION DATE:  05/19/22 REFERRING MD:  Dalene Seltzer, CHIEF COMPLAINT:  found down, cardiac arrest    History of Present Illness:  51 yo woman with hx of bipolar disorder,  here after cardiac arrest.   Per family has been feeling sick and taking trazodone and benadryl at night. They thought she had covid a couple of weeks ago, then recovered somewhat.  For past two weeks has been sob intermittently with some minimal cough, fevers, sore throat.  Her husband came home and she was alert and talking, he went to the bathroom.  10- min later he came back to find her unconscious.  He started CPR immediately, EMS came fairly quickly.  She was reportedly found to be in PEA.  Given 1 mg epi, also lidocaine.  Found once to be in shockable rhythm, shocked once.  Rosc after about 26 min, while in route.   Per family has undergone large weight loss  a few years ago, unclear cause.  Patient had felt it was from lithium but unclear when last takng this..  Poor po intake from low appeitite, nausea and vomiting.  She apparently has not wanted to go to visit the dr.  She recently has only done phone visits with psychiatry.   Sleeps sitting up in recliner.   Per her mother, she has not felt well in years, has complained of some virus or other acute illness affecting her for the past year.   Intubated in ED ( Had king airway in place) at 820 per drug record.  In ED hypothermic  ABG 7.206/49/82 Cefepime and vanc ordered.   AKI cr 1.2 from 0.79 WBC 14.3   Home meds klonipin, trazodone depakote.   Pertinent  Medical History  Bipolar disorder Unexplained weight loss  Poor dentition.  Significant Hospital Events: Including procedures, antibiotic start and stop dates in addition to other pertinent events   10/17: admitted post cardiac arrest; ROSC 26 minutes 10/20: off pressors, will awaken agitated,  desaturates SBT.  10/23 failed extubation trial 10/25 trach  Interim History / Subjective:  No events. Still needing precedex and fent gtt. Hurts with turning. Seems more oriented.  Objective   Blood pressure 116/81, pulse 96, temperature 99.9 F (37.7 C), temperature source Bladder, resp. rate 14, height 5\' 6"  (1.676 m), weight 57.6 kg, SpO2 97 %.    Vent Mode: PRVC FiO2 (%):  [50 %-60 %] 60 % Set Rate:  [22 bmp] 22 bmp Vt Set:  [470 mL] 470 mL PEEP:  [8 cmH20] 8 cmH20 Plateau Pressure:  [19 cmH20] 19 cmH20   Intake/Output Summary (Last 24 hours) at 05/29/2022 0825 Last data filed at 05/29/2022 0800 Gross per 24 hour  Intake 2558.87 ml  Output 2905 ml  Net -346.13 ml    Filed Weights   05/27/22 0349 05/28/22 0438 05/29/22 0500  Weight: 58 kg 58.1 kg 57.6 kg    Examination: Frail cachetic woman in NAD Moves all 4 ext to command +muscle wasting Abd soft, rectal tube in place Small thick secretions Breath sounds with rhonci  BMP stable Weight near admit weight Sugars okay Why is white count up?  Assessment & Plan:  S/p cardiac arrest: found to be in PEA/shockable rhythm. Ultimate cause was likely respiratory from pneumococcal pneumonia.  Acute hypoxic respiratory failure due to Strep pneumonia pneumonia + aspiration.  Did not tolerate extubation (10/23) due to near absent ability to  cough and heavy secretion burden.   Enterobacter and klebsiella HCAP- on zosyn Acute septic/toxic/anoxic encephalopathy- improving slowly New diagnosis of acute HFpEF  Severe protein calorie malnutrition Hyperkalemia/Hypokalemia, resolved AKI due to septic ATN, rhabdo- stable, non-oliguric; renal US okay Anxiety depression bipolar PTA Rib fx from CPR Slowly dropping H/H- s/p 1 unit pRBC 10/25  - Continue seroquel/depakene per tube; DC fent drip, start standing tylenol and increase standing oxycodone; add PRN dilaudid - Increase klonipin, wean precedex - Hold lasix; I do not see  much fluid - Watch H/H and WBC curve - TC trials today - Zosyn x 7 days - Chair positionin bed, work on Health and safety inspector (right click and "Production designer, theatre/television/film" daily)   Diet/type: Tube feeds DVT prophylaxis: heparin subq GI prophylaxis: PPI Lines: Arterial Line Foley:  yes Code Status:  full code Last date of multidisciplinary goals of care discussion [10/27 husband updated at bedside]  33 min cc time Erskine Emery MD [place] for pager If no response to pager, please call [phone] until 7pm After 7pm, Please call E-link [phone]

## 2022-05-29 NOTE — Progress Notes (Signed)
Pharmacy Antibiotic Note  Monica Hopkins is a 51 y.o. female admitted on 05/19/2022 s/p cardiac arrest. Pt was previously treated for pneumonia with ceftraixone. Pt remains on mechanical ventilation. During tracheostomy on 10/25, pt was noted to still have significant pneumonia. Pharmacy has been consulted for Zosyn dosing.  -Day 3 antibiotics -WBC= 15, tmax =101.1 -SCr= 1.93 -BAL cultures with kelbsiella and enterobacter cloacae (sensitive to zosyn)   Plan: Continue Zosyn 3.375g every 8 hours  Consider total of 7 days treatment   Height: _0  (167.6 cm) Weight: 57.6 kg (126 lb 15.8 oz) IBW/kg (Calculated) : 59.3, Temp (24hrs), Avg:100.1 F (37.8 C), Min:98.1 F (36.7 C), Max:101.1 F (38.4 C)  Recent Labs  Lab 05/25/22 0407 05/26/22 0407 05/27/22 0343 05/28/22 0358 05/29/22 0505  WBC 22.3* 18.5* 21.6* 18.8* 25.0*  CREATININE 1.99* 1.88* 1.95* 1.96* 1.93*     Estimated Creatinine Clearance: 31.4 mL/min (A) (by C-G formula based on SCr of 1.93 mg/dL (H)).    Allergies  Allergen Reactions   Abilify [Aripiprazole] Swelling and Rash   Lamictal [Lamotrigine] Swelling and Rash   Latuda [Lurasidone] Other (See Comments)    Caused leg weakness    Thank you for allowing pharmacy to be a part of this patient's care.  Hildred Laser, PharmD Clinical Pharmacist **Pharmacist phone directory can now be found on [email or web address] ([email or web address]).  Listed under [place].

## 2022-05-29 NOTE — Progress Notes (Signed)
SLP Cancellation Note  Patient Details Name: [name] MRN: [id number] DOB: 1970-11-07   Cancelled treatment:       Reason Eval/Treat Not Completed: Patient not medically ready. Pt working on TC trials - did so for 15 minutes this morning. Will continue to follow for now.     Osie Bond., M.A. [place] Office [phone]  Secure chat preferred  05/29/2022, 11:35 AM

## 2022-05-30 ENCOUNTER — Inpatient Hospital Stay (HOSPITAL_COMMUNITY): Payer: Medicaid Other

## 2022-05-30 LAB — CBC
HCT: 24.5 % — ABNORMAL LOW (ref 36.0–46.0)
Hemoglobin: 8.1 g/dL — ABNORMAL LOW (ref 12.0–15.0)
MCH: 31.2 pg (ref 26.0–34.0)
MCHC: 33.1 g/dL (ref 30.0–36.0)
MCV: 94.2 fL (ref 80.0–100.0)
Platelets: 414 10*3/uL — ABNORMAL HIGH (ref 150–400)
RBC: 2.6 MIL/uL — ABNORMAL LOW (ref 3.87–5.11)
RDW: 14.7 % (ref 11.5–15.5)
WBC: 26.3 10*3/uL — ABNORMAL HIGH (ref 4.0–10.5)
nRBC: 0 % (ref 0.0–0.2)

## 2022-05-30 LAB — GLUCOSE, CAPILLARY
Glucose-Capillary: 109 mg/dL — ABNORMAL HIGH (ref 70–99)
Glucose-Capillary: 113 mg/dL — ABNORMAL HIGH (ref 70–99)
Glucose-Capillary: 114 mg/dL — ABNORMAL HIGH (ref 70–99)
Glucose-Capillary: 117 mg/dL — ABNORMAL HIGH (ref 70–99)
Glucose-Capillary: 129 mg/dL — ABNORMAL HIGH (ref 70–99)
Glucose-Capillary: 132 mg/dL — ABNORMAL HIGH (ref 70–99)

## 2022-05-30 LAB — BASIC METABOLIC PANEL
Anion gap: 15 (ref 5–15)
BUN: 79 mg/dL — ABNORMAL HIGH (ref 6–20)
CO2: 27 mmol/L (ref 22–32)
Calcium: 9.9 mg/dL (ref 8.9–10.3)
Chloride: 103 mmol/L (ref 98–111)
Creatinine, Ser: 1.9 mg/dL — ABNORMAL HIGH (ref 0.44–1.00)
GFR, Estimated: 32 mL/min — ABNORMAL LOW (ref >60)
Glucose, Bld: 121 mg/dL — ABNORMAL HIGH (ref 70–99)
Potassium: 3 mmol/L — ABNORMAL LOW (ref 3.5–5.1)
Sodium: 145 mmol/L (ref 135–145)

## 2022-05-30 LAB — PROCALCITONIN: Procalcitonin: 0.76 ng/mL

## 2022-05-30 LAB — TRIGLYCERIDES: Triglycerides: 167 mg/dL — ABNORMAL HIGH (ref <150)

## 2022-05-30 LAB — MAGNESIUM: Magnesium: 1.8 mg/dL (ref 1.7–2.4)

## 2022-05-30 MED ORDER — QUETIAPINE FUMARATE 100 MG PO TABS
100.0000 mg | ORAL_TABLET | Freq: Every day | ORAL | Status: DC
  Administered 2022-05-30 – 2022-06-04 (×6): 100 mg
  Filled 2022-05-30 (×8): qty 1

## 2022-05-30 MED ORDER — QUETIAPINE FUMARATE 25 MG PO TABS
50.0000 mg | ORAL_TABLET | Freq: Every day | ORAL | Status: DC
  Administered 2022-05-30 – 2022-06-06 (×8): 50 mg
  Filled 2022-05-30 (×2): qty 2
  Filled 2022-05-30 (×4): qty 1
  Filled 2022-05-30: qty 2

## 2022-05-30 MED ORDER — HYDROMORPHONE HCL 2 MG PO TABS
2.0000 mg | ORAL_TABLET | Freq: Four times a day (QID) | ORAL | Status: DC
  Administered 2022-05-30 – 2022-05-31 (×4): 2 mg
  Filled 2022-05-30 (×4): qty 1

## 2022-05-30 MED ORDER — POTASSIUM CHLORIDE 20 MEQ PO PACK: 40.0000 meq | PACK | Freq: Once | ORAL | Status: DC

## 2022-05-30 MED ORDER — POTASSIUM CHLORIDE 20 MEQ PO PACK
20.0000 meq | PACK | Freq: Once | ORAL | Status: AC
  Administered 2022-05-30: 20 meq via ORAL
  Filled 2022-05-30: qty 1

## 2022-05-30 MED ORDER — QUETIAPINE FUMARATE 50 MG PO TABS: 50.0000 mg | ORAL_TABLET | Freq: Every day | ORAL | Status: DC

## 2022-05-30 MED ORDER — METOPROLOL TARTRATE 12.5 MG HALF TABLET
12.5000 mg | ORAL_TABLET | Freq: Three times a day (TID) | ORAL | Status: DC
  Administered 2022-05-30 – 2022-06-01 (×5): 12.5 mg
  Filled 2022-05-30 (×5): qty 1

## 2022-05-30 MED ORDER — POTASSIUM CHLORIDE 20 MEQ PO PACK
40.0000 meq | PACK | Freq: Once | ORAL | Status: AC
  Administered 2022-05-30: 40 meq via ORAL
  Filled 2022-05-30: qty 2

## 2022-05-30 MED ORDER — GABAPENTIN 600 MG PO TABS
300.0000 mg | ORAL_TABLET | Freq: Two times a day (BID) | ORAL | Status: DC
  Administered 2022-05-30 – 2022-06-23 (×49): 300 mg
  Filled 2022-05-30 (×50): qty 1

## 2022-05-30 NOTE — Progress Notes (Signed)
Pt unable to tolerate ATC at this time due to increased RR and SPo2 80s. RT placed pt back on ventilator. Pt tolerating well at this time.

## 2022-05-30 NOTE — Progress Notes (Signed)
   05/30/22 1500  Clinical Encounter Type  Visited With Patient;Family;Health care provider  Visit Type Initial;Critical Care  Advance Directives (For Healthcare)  Does Patient Have a Medical Advance Directive? No  Would patient like information on creating a medical advance directive? Yes (Inpatient - patient requests chaplain consult to create a medical advance directive)   [place] visited pt. per verbal referral from pt.'s RN; RN shared that pt. expressed earlier today that she does not want her husband Kasandra Knudsen to be present in her room; RN clarified w/pt. that she does not want husband or his family to be allowed to visit her; RN then initiated discussion re: medical decisionmaking should pt. become unable to express her wishes; pt. does not want her husband to be her medical decisionmaker and would like to appoint her mother and stepfather as HCPOA.  CH confirmed these wishes in visit.  Pt.'s communication is limited d/t trach, but she clearly indicated by nodding/shaking her head that she does not want her husband involved in her medical care and would like her parents to serve as [place].  CH assisted in documenting pt.'s wishes in the first page of AD packet, but the remainder has not been completed; [place] recommends spiritual care follow up later this weekend or early this coming week to confirm pt.'s wishes and notarize document if possible and appropriate.  [name], Chaplain Pager: [phone]

## 2022-05-30 NOTE — Progress Notes (Signed)
RT placed pt on 10L/40% ATC at 0800. Pt is tolerating settings well at this time. RT will continue to monitor.

## 2022-05-30 NOTE — Progress Notes (Signed)
RT collected respiratory culture and sent to lab at this time.

## 2022-05-30 NOTE — Progress Notes (Signed)
NAME:  Monica Hopkins, MRN:  [id number], DOB:  10/31/1970, LOS: 11 ADMISSION DATE:  05/19/2022, CONSULTATION DATE:  05/19/22 REFERRING MD:  Dalene Seltzer, CHIEF COMPLAINT:  found down, cardiac arrest    History of Present Illness:  51 yo woman with hx of bipolar disorder,  here after cardiac arrest.   Per family has been feeling sick and taking trazodone and benadryl at night. They thought she had covid a couple of weeks ago, then recovered somewhat.  For past two weeks has been sob intermittently with some minimal cough, fevers, sore throat.  Her husband came home and she was alert and talking, he went to the bathroom.  10- min later he came back to find her unconscious.  He started CPR immediately, EMS came fairly quickly.  She was reportedly found to be in PEA.  Given 1 mg epi, also lidocaine.  Found once to be in shockable rhythm, shocked once.  Rosc after about 26 min, while in route.   Per family has undergone large weight loss  a few years ago, unclear cause.  Patient had felt it was from lithium but unclear when last takng this..  Poor po intake from low appeitite, nausea and vomiting.  She apparently has not wanted to go to visit the dr.  She recently has only done phone visits with psychiatry.   Sleeps sitting up in recliner.   Per her mother, she has not felt well in years, has complained of some virus or other acute illness affecting her for the past year.   Intubated in ED ( Had king airway in place) at 820 per drug record.  In ED hypothermic  ABG 7.206/49/82 Cefepime and vanc ordered.   AKI cr 1.2 from 0.79 WBC 14.3   Home meds klonipin, trazodone depakote.   Pertinent  Medical History  Bipolar disorder Unexplained weight loss  Poor dentition.  Significant Hospital Events: Including procedures, antibiotic start and stop dates in addition to other pertinent events   10/17: admitted post cardiac arrest; ROSC 26 minutes 10/20: off pressors, will awaken agitated,  desaturates SBT.  10/23 failed extubation trial 10/25 trach  Interim History / Subjective:  No events. Anxiety/pain/agitation still an issue.  Objective   Blood pressure 119/78, pulse (!) 109, temperature 99.7 F (37.6 C), resp. rate (!) 32, height 5\' 6"  (1.676 m), weight 57.6 kg, SpO2 91 %.    Vent Mode: PRVC FiO2 (%):  [40 %-80 %] 80 % Set Rate:  [22 bmp] 22 bmp Vt Set:  [470 mL] 470 mL PEEP:  [8 cmH20] 8 cmH20 Pressure Support:  [10 cmH20] 10 cmH20 Plateau Pressure:  [18 cmH20-20 cmH20] 20 cmH20   Intake/Output Summary (Last 24 hours) at 05/30/2022 0857 Last data filed at 05/30/2022 0700 Gross per 24 hour  Intake 1588.92 ml  Output 2700 ml  Net -1111.08 ml    Filed Weights   05/28/22 0438 05/29/22 0500 05/30/22 0500  Weight: 58.1 kg 57.6 kg 57.6 kg    Examination: Anxious woman in NAD Small thick secretions, rhonci bilaterally +muscle wasting Improved anasarca Moves all 4 ext to command Follows commands  BMP stable, K being repleted Weight near admit weight Sugars okay WBC remains up still for unclear reason  Assessment & Plan:  S/p cardiac arrest: found to be in PEA/shockable rhythm. Ultimate cause was likely respiratory from pneumococcal pneumonia.  Acute hypoxic respiratory failure due to Strep pneumonia pneumonia + aspiration.  Did not tolerate extubation (10/23) due to near absent ability to cough  and heavy secretion burden.   Enterobacter and klebsiella HCAP- on zosyn Acute septic/toxic/anoxic encephalopathy- improving slowly New diagnosis of acute HFpEF  Severe protein calorie malnutrition Hyperkalemia/Hypokalemia, resolved AKI due to septic ATN, rhabdo- stable, non-oliguric; renal US okay Anxiety depression bipolar PTA Rib fx from CPR Slowly dropping H/H- s/p 1 unit pRBC 10/25; stable now  - Continue klonipin/depakene at current doses.  Increase qHS seroquel.  Switch standing oxycodone to standing dilaudid + gabapentin per tube.  PRN IV dilaudid  for breakthrough. Try to wean precedex/ - Self-equilibrating fluid status, continue to hold diuretics - Watch H/H and WBC curve - Recheck CXR, Pct, and tracheal aspirate - TC trials as able - Zosyn x 7 days - Chair positionin bed, work on Health and safety inspector (right click and "Production designer, theatre/television/film" daily)   Diet/type: Tube feeds DVT prophylaxis: heparin subq GI prophylaxis: PPI Lines: Arterial Line Foley:  yes Code Status:  full code Last date of multidisciplinary goals of care discussion [10/27 husband updated at bedside]  35 min cc time Erskine Emery MD [facility] [place] for pager If no response to pager, please call [phone] until 7pm After 7pm, Please call E-link [phone]

## 2022-05-31 LAB — CBC
HCT: 23.1 % — ABNORMAL LOW (ref 36.0–46.0)
Hemoglobin: 7.6 g/dL — ABNORMAL LOW (ref 12.0–15.0)
MCH: 31.7 pg (ref 26.0–34.0)
MCHC: 32.9 g/dL (ref 30.0–36.0)
MCV: 96.3 fL (ref 80.0–100.0)
Platelets: 433 10*3/uL — ABNORMAL HIGH (ref 150–400)
RBC: 2.4 MIL/uL — ABNORMAL LOW (ref 3.87–5.11)
RDW: 15 % (ref 11.5–15.5)
WBC: 26 10*3/uL — ABNORMAL HIGH (ref 4.0–10.5)
nRBC: 0 % (ref 0.0–0.2)

## 2022-05-31 LAB — BASIC METABOLIC PANEL WITH GFR
Anion gap: 13 (ref 5–15)
BUN: 75 mg/dL — ABNORMAL HIGH (ref 6–20)
CO2: 28 mmol/L (ref 22–32)
Calcium: 9.8 mg/dL (ref 8.9–10.3)
Chloride: 107 mmol/L (ref 98–111)
Creatinine, Ser: 1.77 mg/dL — ABNORMAL HIGH (ref 0.44–1.00)
GFR, Estimated: 34 mL/min — ABNORMAL LOW
Glucose, Bld: 120 mg/dL — ABNORMAL HIGH (ref 70–99)
Potassium: 3.7 mmol/L (ref 3.5–5.1)
Sodium: 148 mmol/L — ABNORMAL HIGH (ref 135–145)

## 2022-05-31 LAB — GLUCOSE, CAPILLARY
Glucose-Capillary: 107 mg/dL — ABNORMAL HIGH (ref 70–99)
Glucose-Capillary: 113 mg/dL — ABNORMAL HIGH (ref 70–99)
Glucose-Capillary: 133 mg/dL — ABNORMAL HIGH (ref 70–99)
Glucose-Capillary: 148 mg/dL — ABNORMAL HIGH (ref 70–99)
Glucose-Capillary: 148 mg/dL — ABNORMAL HIGH (ref 70–99)
Glucose-Capillary: 96 mg/dL (ref 70–99)

## 2022-05-31 LAB — MAGNESIUM: Magnesium: 1.9 mg/dL (ref 1.7–2.4)

## 2022-05-31 MED ORDER — HYDROMORPHONE HCL 2 MG PO TABS
2.0000 mg | ORAL_TABLET | Freq: Four times a day (QID) | ORAL | Status: DC
  Administered 2022-05-31 – 2022-06-02 (×5): 2 mg
  Filled 2022-05-31 (×5): qty 1

## 2022-05-31 MED ORDER — FREE WATER
200.0000 mL | Freq: Four times a day (QID) | Status: DC
  Administered 2022-05-31 – 2022-06-02 (×8): 200 mL

## 2022-05-31 MED ORDER — POTASSIUM CHLORIDE 20 MEQ PO PACK
40.0000 meq | PACK | Freq: Once | ORAL | Status: AC
  Administered 2022-05-31: 40 meq
  Filled 2022-05-31: qty 2

## 2022-05-31 MED ORDER — MAGNESIUM SULFATE 2 GM/50ML IV SOLN
2.0000 g | Freq: Once | INTRAVENOUS | Status: AC
  Administered 2022-05-31: 2 g via INTRAVENOUS
  Filled 2022-05-31: qty 50

## 2022-05-31 MED ORDER — HYDROMORPHONE HCL 2 MG PO TABS: 4.0000 mg | ORAL_TABLET | Freq: Four times a day (QID) | ORAL | Status: DC
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
## 2022-06-01 ENCOUNTER — Inpatient Hospital Stay (HOSPITAL_COMMUNITY): Payer: Medicaid Other

## 2022-06-01 LAB — POCT I-STAT 7, (LYTES, BLD GAS, ICA,H+H)
Acid-Base Excess: 3 mmol/L — ABNORMAL HIGH (ref 0.0–2.0)
Bicarbonate: 27.9 mmol/L (ref 20.0–28.0)
Calcium, Ion: 1.4 mmol/L (ref 1.15–1.40)
HCT: 22 % — ABNORMAL LOW (ref 36.0–46.0)
Hemoglobin: 7.5 g/dL — ABNORMAL LOW (ref 12.0–15.0)
O2 Saturation: 95 %
Patient temperature: 98.6
Potassium: 3.8 mmol/L (ref 3.5–5.1)
Sodium: 148 mmol/L — ABNORMAL HIGH (ref 135–145)
TCO2: 29 mmol/L (ref 22–32)
pCO2 arterial: 45.4 mmHg (ref 32–48)
pH, Arterial: 7.397 (ref 7.35–7.45)
pO2, Arterial: 75 mmHg — ABNORMAL LOW (ref 83–108)

## 2022-06-01 LAB — GLUCOSE, CAPILLARY
Glucose-Capillary: 103 mg/dL — ABNORMAL HIGH (ref 70–99)
Glucose-Capillary: 105 mg/dL — ABNORMAL HIGH (ref 70–99)
Glucose-Capillary: 107 mg/dL — ABNORMAL HIGH (ref 70–99)
Glucose-Capillary: 112 mg/dL — ABNORMAL HIGH (ref 70–99)
Glucose-Capillary: 117 mg/dL — ABNORMAL HIGH (ref 70–99)
Glucose-Capillary: 95 mg/dL (ref 70–99)

## 2022-06-01 MED ORDER — METOPROLOL TARTRATE 25 MG PO TABS
25.0000 mg | ORAL_TABLET | Freq: Three times a day (TID) | ORAL | Status: DC
  Administered 2022-06-01 – 2022-06-15 (×34): 25 mg
  Filled 2022-06-01 (×39): qty 1

## 2022-06-01 MED ORDER — SODIUM CHLORIDE 0.9 % IV SOLN
2.0000 g | Freq: Two times a day (BID) | INTRAVENOUS | Status: DC
  Administered 2022-06-01 – 2022-06-04 (×6): 2 g via INTRAVENOUS
  Filled 2022-06-01 (×10): qty 40

## 2022-06-01 NOTE — Progress Notes (Signed)
Physical Therapy Treatment Patient Details Name: Monica Hopkins MRN: [id number] DOB: 03-27-1971 Today's Date: 06/01/2022   History of Present Illness Pt is a 51 y.o. female admitted 05/19/22 with cardiac arrest at home, ROSC 26 min, hypothermic. Intubated 10/17, failed extubation 10/23; trach placed 10/25. Pt with new HFpEF. PMH includes bipolar disorder.   PT Comments    Pt slowly progressing with mobility. Today's session focused on strengthening and seated balance from chair egress position; pt requires mod-maxA for repositioning and forward weight translation, demonstrates improving ability to maintain long sitting balance with assist. Pt with diffuse muscle weakness, but tolerates AAROM for strengthening all four extremities. Repositioned neck to encourage neutral cervical rotation, though limited by pain at trach site. Pt easily distracted and difficult to communicate with at times. Per CM note, no payor for LTAC, therefore recommend SNF-level therapies to maximize functional mobility and decrease caregiver burden before return home.    Recommendations for follow up therapy are one component of a multi-disciplinary discharge planning process, led by the attending physician.  Recommendations may be updated based on patient status, additional functional criteria and insurance authorization.  Follow Up Recommendations  Skilled nursing-short term rehab (<3 hours/day) Can patient physically be transported by private vehicle: No   Assistance Recommended at Discharge Frequent or constant Supervision/Assistance  Patient can return home with the following Two people to help with walking and/or transfers;Two people to help with bathing/dressing/bathroom;Direct supervision/assist for medications management;Help with stairs or ramp for entrance;Assist for transportation;Assistance with cooking/housework;Direct supervision/assist for financial management;Assistance with feeding   Equipment  Recommendations  Wheelchair (measurements PT);Hospital bed;Wheelchair cushion (measurements PT)    Recommendations for Other Services       Precautions / Restrictions Precautions Precautions: Fall;Other (comment) Precaution Comments: flexiseal, cortrak, trach, vent Restrictions Weight Bearing Restrictions: No     Mobility  Bed Mobility Overal bed mobility: Needs Assistance Bed Mobility: Supine to Sit, Sit to Supine     Supine to sit: Max assist, HOB elevated Sit to supine: Max assist, HOB elevated   General bed mobility comments: utilized foot egress bed positioning, pt requiring max-totalA for anterior trunk weight shift into unsupported long sitting, though pt requiring HHA or UE support on bedrail. pt becoming more distractable with difficulty regaining attention (suspect related to fatigue) deferred transfer to EOB/OOB. recommend use of maxisky lift with nursing    Transfers                        Ambulation/Gait                   Stairs             Wheelchair Mobility    Modified Rankin (Stroke Patients Only)       Balance Overall balance assessment: Needs assistance Sitting-balance support: No upper extremity supported, Bilateral upper extremity supported, Feet supported Sitting balance-Leahy Scale: Poor Sitting balance - Comments: reliant on UE support and or external assist to maintain static long sitting in bed egress position, forward flexed posture                                    Cognition Arousal/Alertness: Awake/alert Behavior During Therapy: Restless Overall Cognitive Status: Impaired/Different from baseline Area of Impairment: Attention, Memory, Following commands, Safety/judgement, Awareness, Problem solving  Current Attention Level: Sustained Memory: Decreased short-term memory Following Commands: Follows one step commands with increased time, Follows one step commands  inconsistently Safety/Judgement: Decreased awareness of deficits, Decreased awareness of safety Awareness: Intellectual Problem Solving: Slow processing, Decreased initiation, Requires verbal cues, Requires tactile cues General Comments: pt seemed to become more restless when parents coming in at beginning of session and talking with pt (they stepped out for rest of session per request). pt continuously attempts to verbalize/mouth words despite difficulty reading her lips and requesting she stick with yes/no responses with head nods. follows majority of simple commands with cues. easily distracted; seems to perseverate on wanting water, discomfort with flexiseal and thinking it's "gross" (despite attempts to cover it) and pointing to bathroom        Exercises Other Exercises Other Exercises: BLE AAROM hip abd/add, knee flex/ext, SAQ; BUE AAROM shoulder flex/abd, elbow flex/ext (able to extend R elbow against resistance) - easily distracted, requires frequent verbal/tactile cues for muscle activation Other Exercises: Significant neck muscle tightness with head rotated/flexed to R-side (away from vent), limited active movement even achieve midline and poor tolerance for PROM to midline with c/o trach site pain; pillow/pad placed in attempt to encourage more neutral cervical alignment Other Exercises: anterior weight translation from elevated HOB with BUE support to pull forward; attempting to also have pt pull on bedrails, but pt inconsistently doing this. inconsistent balance reactions to prevent falling back onto [facility] Other Exercises: pt gripping bed rails with partial rolling and repositioning with HOB elevated but not keeping hold of washcloth requiring maxA to bring it to face to wash lips/mouth    General Comments        Pertinent Vitals/Pain Pain Assessment Pain Assessment: Faces Faces Pain Scale: Hurts little more Pain Location: buttocks, BLEs with AAROM/PROM, trach site Pain Descriptors  / Indicators: Discomfort, Grimacing Pain Intervention(s): Monitored during session, Repositioned    Home Living                          Prior Function            PT Goals (current goals can now be found in the care plan section) Progress towards PT goals: Progressing toward goals    Frequency    Min 3X/week      PT Plan Current plan remains appropriate    Co-evaluation              AM-PAC PT "6 Clicks" Mobility   Outcome Measure  Help needed turning from your back to your side while in a flat bed without using bedrails?: Total Help needed moving from lying on your back to sitting on the side of a flat bed without using bedrails?: Total Help needed moving to and from a bed to a chair (including a wheelchair)?: Total Help needed standing up from a chair using your arms (e.g., wheelchair or bedside chair)?: Total Help needed to walk in hospital room?: Total Help needed climbing 3-5 steps with a railing? : Total 6 Click Score: 6    End of Session Equipment Utilized During Treatment: Oxygen Activity Tolerance: Patient tolerated treatment well;Patient limited by fatigue Patient left: in bed;with call bell/phone within reach Nurse Communication: Mobility status;Need for lift equipment PT Visit Diagnosis: Other abnormalities of gait and mobility (R26.89);Muscle weakness (generalized) (M62.81);Unsteadiness on feet (R26.81);Other symptoms and signs involving the nervous system (Y86.578)     Time: 4696-2952 PT Time Calculation (min) (ACUTE ONLY): 36 min  Charges:  $  Therapeutic Activity: 23-37 mins            Mabeline Caras, PT, DPT Acute Rehabilitation Services  Personal: [facility] Office: [place] 06/01/2022, 12:33 PM

## 2022-06-01 NOTE — Progress Notes (Addendum)
eLink Physician-Brief Progress Note Patient Name: Setsuko Robins DOB: [birth year] MRN: [id number]   Date of Service  06/01/2022  HPI/Events of Note  Camera: For sudden drop in oxygenation. RT adjusted ventilator. Has Trach- to vent 470 ( via trach > 8 ml/ibw), PEEP 10, rate 18, 60% sats. Sats now at 96%. Suctioning- no secretions. Resting now. VS stable.    eICU Interventions  Chest PT ( had twice bronch for mucous plugging and atelectasis) , CxR and Abg ordered.     Intervention Category Intermediate Interventions: Respiratory distress - evaluation and management  Elmer Sow 06/01/2022, 1:59 AM  4:53 CxR  ABG reviewed. Continue care. No pneumothorax. Pco2 ok.

## 2022-06-01 NOTE — Progress Notes (Signed)
SLP Cancellation Note  Patient Details Name: Monica Hopkins MRN: [id number] DOB: November 02, 1970   Cancelled treatment:  Pt on full vent support. SLP continues to follow for readiness for SLP interventions.  Terik Haughey L. Tivis Ringer, MA CCC/SLP Clinical Specialist - Acute Care SLP Acute Rehabilitation Services Office number [phone]        Juan Quam Laurice 06/01/2022, 8:58 AM
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
## 2022-06-01 NOTE — Progress Notes (Signed)
   06/01/22 1300  Clinical Encounter Type  Visited With Patient;Family  Visit Type Follow-up  Referral From Nurse  Consult/Referral To Chaplain   Per call from Nurse Jabier Gauss facilitated execution of HCPOA for patient. Family was at bedside and were very grateful. Document scanned into patient's chart and copies provided as requested.    Melody Haver, Resident Chaplain [phone]

## 2022-06-01 NOTE — Progress Notes (Signed)
NAME:  Monica Hopkins, MRN:  [id number], DOB:  1971-07-30, LOS: 13 ADMISSION DATE:  05/19/2022, CONSULTATION DATE:  05/19/22 REFERRING MD:  Dalene Seltzer, CHIEF COMPLAINT:  found down, cardiac arrest    History of Present Illness:  51 yo woman with hx of bipolar disorder,  here after cardiac arrest.   Per family has been feeling sick and taking trazodone and benadryl at night. They thought she had covid a couple of weeks ago, then recovered somewhat.  For past two weeks has been sob intermittently with some minimal cough, fevers, sore throat.  Her husband came home and she was alert and talking, he went to the bathroom.  10- min later he came back to find her unconscious.  He started CPR immediately, EMS came fairly quickly.  She was reportedly found to be in PEA.  Given 1 mg epi, also lidocaine.  Found once to be in shockable rhythm, shocked once.  Rosc after about 26 min, while in route.   Per family has undergone large weight loss  a few years ago, unclear cause.  Patient had felt it was from lithium but unclear when last takng this..  Poor po intake from low appeitite, nausea and vomiting.  She apparently has not wanted to go to visit the dr.  She recently has only done phone visits with psychiatry.   Sleeps sitting up in recliner.   Per her mother, she has not felt well in years, has complained of some virus or other acute illness affecting her for the past year.   Intubated in ED ( Had king airway in place) at 820 per drug record.  In ED hypothermic  ABG 7.206/49/82 Cefepime and vanc ordered.   AKI cr 1.2 from 0.79 WBC 14.3   Home meds klonipin, trazodone depakote.   Pertinent  Medical History  Bipolar disorder Unexplained weight loss  Poor dentition.  Significant Hospital Events: Including procedures, antibiotic start and stop dates in addition to other pertinent events   10/17: admitted post cardiac arrest; ROSC 26 minutes 10/20: off pressors, will awaken agitated,  desaturates SBT.  10/23 failed extubation trial 10/25 trach  Interim History / Subjective:  Overnight patient became hypoxic with increasing FiO2 requirement, PEEP increased to 10 with improvement in oxygenation Spiked fever yesterday with Tmax 101 X-ray chest is suggestive of increasing left lower lobe infiltrate  Objective   Blood pressure 95/75, pulse 98, temperature 98.7 F (37.1 C), temperature source Oral, resp. rate (!) 23, height 5\' 6"  (1.676 m), weight 57.2 kg, SpO2 96 %.    Vent Mode: PRVC FiO2 (%):  [40 %-60 %] 50 % Set Rate:  [18 bmp-22 bmp] 18 bmp Vt Set:  [470 mL] 470 mL PEEP:  [8 cmH20-10 cmH20] 8 cmH20 Plateau Pressure:  [20 cmH20-23 cmH20] 20 cmH20   Intake/Output Summary (Last 24 hours) at 06/01/2022 06/03/2022 Last data filed at 06/01/2022 0800 Gross per 24 hour  Intake 1587.54 ml  Output 2450 ml  Net -862.46 ml   Filed Weights   05/29/22 0500 05/30/22 0500 05/31/22 0600  Weight: 57.6 kg 57.6 kg 57.2 kg    Examination:   Physical exam: General: Crtitically ill-appearing cachectic female, s/p trach HEENT: [place]/AT, eyes anicteric.  Cortrak in place Neuro: Awake, following commands, moving all 4 extremities Chest: Coarse breath sounds, no wheezes or rhonchi Heart: Regular rate and rhythm, no murmurs or gallops Abdomen: Soft, nontender, nondistended, bowel sounds present Skin: No rash  Assessment & Plan:  S/p cardiac arrest: found to be  in PEA/shockable rhythm, in the setting of hypoxia due to pneumococcal pneumonia  Acute hypoxic respiratory failure due to Strep pneumonia pneumonia, complicated with aspiration and healthcare associated pneumonia with Enterobacter and Klebsiella, now s/p trach Overnight patient became hypoxic, requiring increasing FiO2 and PEEP Spiked fever yesterday with Tmax 101, despite being on appropriate antibiotics with Zosyn X-ray chest is suggestive of increasing left lower lobe infiltrate Switch antibiotics to meropenem Follow-up  repeat respiratory culture Continue titrate FiO2 and PEEP Avoid deep sedation  Acute septic/toxic encephalopathy-improved Patient is much more alert and awake Avoid deep sedation  New diagnosis of acute HFpEF  Monitor intake and output  She looks euvolemic Hold diuretic  Severe protein calorie malnutrition Continue tube feeds Dietitian is following  Acute rhabdomyolysis, resolved Hyperkalemia/Hypokalemia, resolved AKI due to septic ATN, rhabdomyolysis Hypernatremia Serum creatinine slowly trending down Monitor intake and output Avoid nephrotoxic agent Closely monitor electrolytes Renal ultrasound showed no abscess patient Continue free water flushes  Anxiety depression bipolar PTA Rib fx from CPR Continue Klonopin, Depakote and Seroquel  Acute blood loss anemia on anemia of chronic disease Patient hemoglobin slowly trending down likely due to acute illness  Best Practice (right click and "Reselect all SmartList Selections" daily)   Diet/type: Tube feeds DVT prophylaxis: heparin subq GI prophylaxis: PPI Lines: Arterial Line Foley:  yes Code Status:  full code Last date of multidisciplinary goals of care discussion 10/30: Patient mother and sister were updated at bedside  Total critical care time: 35 minutes  Performed by: Jacky Kindle   Critical care time was exclusive of separately billable procedures and treating other patients.   Critical care was necessary to treat or prevent imminent or life-threatening deterioration.   Critical care was time spent personally by me on the following activities: development of treatment plan with patient and/or surrogate as well as nursing, discussions with consultants, evaluation of patient's response to treatment, examination of patient, obtaining history from patient or surrogate, ordering and performing treatments and interventions, ordering and review of laboratory studies, ordering and review of radiographic studies, pulse  oximetry and re-evaluation of patient's condition.   Jacky Kindle, MD [place] [facility] See Amion for pager If no response to pager, please call [phone] until 7pm After 7pm, Please call E-link [phone]

## 2022-06-02 LAB — CBC WITH DIFFERENTIAL/PLATELET
Abs Immature Granulocytes: 0.15 10*3/uL — ABNORMAL HIGH (ref 0.00–0.07)
Basophils Absolute: 0.1 10*3/uL (ref 0.0–0.1)
Basophils Relative: 1 %
Eosinophils Absolute: 0.1 10*3/uL (ref 0.0–0.5)
Eosinophils Relative: 1 %
HCT: 22.2 % — ABNORMAL LOW (ref 36.0–46.0)
Hemoglobin: 7.1 g/dL — ABNORMAL LOW (ref 12.0–15.0)
Immature Granulocytes: 1 %
Lymphocytes Relative: 7 %
Lymphs Abs: 1.3 10*3/uL (ref 0.7–4.0)
MCH: 31.7 pg (ref 26.0–34.0)
MCHC: 32 g/dL (ref 30.0–36.0)
MCV: 99.1 fL (ref 80.0–100.0)
Monocytes Absolute: 0.7 10*3/uL (ref 0.1–1.0)
Monocytes Relative: 4 %
Neutro Abs: 17 10*3/uL — ABNORMAL HIGH (ref 1.7–7.7)
Neutrophils Relative %: 86 %
Platelets: 413 10*3/uL — ABNORMAL HIGH (ref 150–400)
RBC: 2.24 MIL/uL — ABNORMAL LOW (ref 3.87–5.11)
RDW: 15.4 % (ref 11.5–15.5)
WBC: 19.3 10*3/uL — ABNORMAL HIGH (ref 4.0–10.5)
nRBC: 0 % (ref 0.0–0.2)

## 2022-06-02 LAB — COMPREHENSIVE METABOLIC PANEL
ALT: 23 U/L (ref 0–44)
AST: 21 U/L (ref 15–41)
Albumin: 1.9 g/dL — ABNORMAL LOW (ref 3.5–5.0)
Alkaline Phosphatase: 61 U/L (ref 38–126)
Anion gap: 9 (ref 5–15)
BUN: 69 mg/dL — ABNORMAL HIGH (ref 6–20)
CO2: 28 mmol/L (ref 22–32)
Calcium: 9.8 mg/dL (ref 8.9–10.3)
Chloride: 114 mmol/L — ABNORMAL HIGH (ref 98–111)
Creatinine, Ser: 1.47 mg/dL — ABNORMAL HIGH (ref 0.44–1.00)
GFR, Estimated: 43 mL/min — ABNORMAL LOW (ref >60)
Glucose, Bld: 115 mg/dL — ABNORMAL HIGH (ref 70–99)
Potassium: 3.5 mmol/L (ref 3.5–5.1)
Sodium: 151 mmol/L — ABNORMAL HIGH (ref 135–145)
Total Bilirubin: 0.5 mg/dL (ref 0.3–1.2)
Total Protein: 5.9 g/dL — ABNORMAL LOW (ref 6.5–8.1)

## 2022-06-02 LAB — MAGNESIUM: Magnesium: 2.2 mg/dL (ref 1.7–2.4)

## 2022-06-02 LAB — POCT I-STAT 7, (LYTES, BLD GAS, ICA,H+H)
Acid-Base Excess: 4 mmol/L — ABNORMAL HIGH (ref 0.0–2.0)
Bicarbonate: 28.6 mmol/L — ABNORMAL HIGH (ref 20.0–28.0)
Calcium, Ion: 1.4 mmol/L (ref 1.15–1.40)
HCT: 21 % — ABNORMAL LOW (ref 36.0–46.0)
Hemoglobin: 7.1 g/dL — ABNORMAL LOW (ref 12.0–15.0)
O2 Saturation: 90 %
Potassium: 3.7 mmol/L (ref 3.5–5.1)
Sodium: 151 mmol/L — ABNORMAL HIGH (ref 135–145)
TCO2: 30 mmol/L (ref 22–32)
pCO2 arterial: 44.1 mmHg (ref 32–48)
pH, Arterial: 7.42 (ref 7.35–7.45)
pO2, Arterial: 59 mmHg — ABNORMAL LOW (ref 83–108)

## 2022-06-02 LAB — GLUCOSE, CAPILLARY
Glucose-Capillary: 99 mg/dL (ref 70–99)
Glucose-Capillary: 94 mg/dL (ref 70–99)
Glucose-Capillary: 108 mg/dL — ABNORMAL HIGH (ref 70–99)
Glucose-Capillary: 96 mg/dL (ref 70–99)
Glucose-Capillary: 88 mg/dL (ref 70–99)

## 2022-06-02 LAB — TRIGLYCERIDES: Triglycerides: 162 mg/dL — ABNORMAL HIGH (ref <150)

## 2022-06-02 LAB — PHOSPHORUS: Phosphorus: 4.1 mg/dL (ref 2.5–4.6)

## 2022-06-02 LAB — CULTURE, RESPIRATORY W GRAM STAIN

## 2022-06-02 MED ORDER — FAMOTIDINE 20 MG PO TABS
20.0000 mg | ORAL_TABLET | Freq: Two times a day (BID) | ORAL | Status: DC
  Administered 2022-06-02 – 2022-06-23 (×42): 20 mg
  Filled 2022-06-02 (×42): qty 1

## 2022-06-02 MED ORDER — ORAL CARE MOUTH RINSE
15.0000 mL | OROMUCOSAL | Status: DC | PRN
  Administered 2022-06-20: 15 mL via OROMUCOSAL

## 2022-06-02 MED ORDER — HYDROMORPHONE HCL 2 MG PO TABS
1.0000 mg | ORAL_TABLET | Freq: Four times a day (QID) | ORAL | Status: DC
  Administered 2022-06-02 (×2): 1 mg
  Filled 2022-06-02 (×2): qty 1

## 2022-06-02 MED ORDER — VITAMIN C 500 MG PO TABS
500.0000 mg | ORAL_TABLET | Freq: Two times a day (BID) | ORAL | Status: DC
  Administered 2022-06-02 – 2022-06-23 (×42): 500 mg
  Filled 2022-06-02 (×41): qty 1

## 2022-06-02 MED ORDER — POTASSIUM CHLORIDE 20 MEQ PO PACK
40.0000 meq | PACK | Freq: Once | ORAL | Status: AC
  Administered 2022-06-02: 40 meq
  Filled 2022-06-02: qty 2

## 2022-06-02 MED ORDER — FREE WATER
200.0000 mL | Status: DC
  Administered 2022-06-02 – 2022-06-07 (×58): 200 mL

## 2022-06-02 MED ORDER — CLONAZEPAM 0.5 MG PO TABS
2.0000 mg | ORAL_TABLET | Freq: Every day | ORAL | Status: DC
  Administered 2022-06-03 – 2022-06-23 (×19): 2 mg
  Filled 2022-06-02 (×5): qty 4
  Filled 2022-06-02: qty 2
  Filled 2022-06-02 (×2): qty 4
  Filled 2022-06-02: qty 2
  Filled 2022-06-02 (×2): qty 4
  Filled 2022-06-02: qty 2
  Filled 2022-06-02 (×8): qty 4

## 2022-06-02 MED ORDER — BANATROL TF EN LIQD
60.0000 mL | Freq: Two times a day (BID) | ENTERAL | Status: DC
  Administered 2022-06-02 – 2022-06-10 (×16): 60 mL
  Filled 2022-06-02 (×17): qty 60

## 2022-06-02 MED ORDER — JUVEN PO PACK
1.0000 | PACK | Freq: Two times a day (BID) | ORAL | Status: DC
  Administered 2022-06-03 – 2022-06-23 (×42): 1
  Filled 2022-06-02 (×41): qty 1

## 2022-06-02 MED ORDER — ORAL CARE MOUTH RINSE
15.0000 mL | OROMUCOSAL | Status: DC
  Administered 2022-06-03 – 2022-06-25 (×82): 15 mL via OROMUCOSAL

## 2022-06-02 MED ORDER — HYDROMORPHONE HCL 2 MG PO TABS
1.0000 mg | ORAL_TABLET | Freq: Four times a day (QID) | ORAL | Status: DC | PRN
  Administered 2022-06-03 – 2022-06-10 (×13): 1 mg
  Filled 2022-06-02 (×14): qty 1

## 2022-06-02 NOTE — Progress Notes (Signed)
Patient was found to be lethargic and sleepy on trach collar  ABGs were done showed normal pH and O2 sat 90%  She was on Klonopin 2 mg 3 times daily and Dilaudid 1 mg every 6 hours scheduled  Klonopin was changed to 2 mg nightly and Dilaudid to 1 mg every 6 hours as needed  Patient is able to wake up and follow simple commands.  We will keep her on trach collar as long as she is able to maintain O2 sat ~90% and not in respiratory distress    Jacky Kindle, MD [place] [facility] See Amion for pager If no response to pager, please call [phone] until 7pm After 7pm, Please call E-link [phone]

## 2022-06-02 NOTE — Progress Notes (Signed)
Nutrition Follow-up  DOCUMENTATION CODES:   Severe malnutrition in context of social or environmental circumstances, Underweight  INTERVENTION:   Recommend considering PEG placement; discussed with PCCM  Poor dentition remains an issue and will likely impact po intake in a negative way once diet advanced. Discussed plan regarding pt's teeth with PCCM. Plan at this time is for outpatient dental extraction.   Tube Feeding via Cortrak:  Vital AF 1.2 at 55 ml/hr  Provides 1584 kcals, 99 g of protein and 1069 mL of free water daily  Current free water flushes of 200 mL q 2 hours with current TF provide 3469 mL of free water  Add Juven BID, each packet provides 80 calories, 8 grams of carbohydrate, 2.5  grams of protein (collagen), 7 grams of L-arginine and 7 grams of L-glutamine; supplement contains CaHMB, Vitamins C, E, B12 and Zinc to promote wound healing  Add Vitamin C 500 mg BID given vitamin C level of <0.1 and increased needs related to wound healing. Plan to add additional zinc at this time as zinc wdl  Continue Thiamine and Folic Acid Continue Vitamin A as able   Banatrol TF BID via tube- Provides 45kcal, 5g soluble fiber and 2g protein per serving.   NUTRITION DIAGNOSIS:   Severe Malnutrition related to social / environmental circumstances (poor PO intake related to poor dentition after taking lithium) as evidenced by severe fat depletion, severe muscle depletion.  Being addressed via TF   GOAL:   Patient will meet greater than or equal to 90% of their needs  Progressing  MONITOR:   Vent status, Labs, Weight trends, TF tolerance, Skin, I & O's  REASON FOR ASSESSMENT:   Ventilator    ASSESSMENT:   51 year old female with PMHx of bipolar disorder, poor dentition, unexplained wt loss admitted after cardiac arrest. Found unconscious by husband who started CPR immediately. Intubated in ED on 05/19/22.   10/17 Admitted post arrest 10/18 Cortrak placed, TF  initiated at 15 ml/hr 10/19 TF increased to 25 ml/hr 10/20 Off pressors 10/23 Failed extubation  10/25 Trach  Pt sleeping on visit today; Pt's sister was at bedside. Dicussed current nutrition plan with Sister; in addition to possible nutrition considerations moving forward.   Pt has been tolerating Vital AF 1.2 at 55 ml/hr  Pt remains NPO, SLP following . Tolerating TC trials  Noted free water flush of 200 mL q 2 hours per MD  Current wt 55.2 kg; admit weight 50 kg. Net +7 L. Very mild edema on exam. Of note, pt was volume depleted on admission.   Pt with small amounts of liquid stool via rectal tube. Plan to add Banatrol with goal of removing rectal tube    Micronutrient Labs: CRP: 23.2 (H) Vitamin B12: 4289 (H) Folate B9: 6.0 (low normal)-continue supplementation Vitamin A: 3.7 (L) Vitamin C: <0.01 (L) Copper: 222 (wdl) Zinc: 46 (wdl)  Labs: sodium 151 (H) Meds: ss novolog, folic acid, thiamine, Vitamin A   Diet Order:   Diet Order             Diet NPO time specified  Diet effective now                   EDUCATION NEEDS:   Not appropriate for education at this time  Skin:  Skin Assessment: Skin Integrity Issues: Skin Integrity Issues:: DTI, Stage II, Stage III DTI: left lateral heel (2cm x 2cm); right lateral heel (1cm x 2cm) Stage II: sacrum (7cm x  10cm), vertebral column (3cm x 1cm), lower vertebral column (1cm x 1cm) Stage III: upper vertebral column (1cm x 1cm)  Last BM:  10/31 rectal tube  Height:   Ht Readings from Last 1 Encounters:  05/19/22 5\' 6"  (1.676 m)    Weight:   Wt Readings from Last 1 Encounters:  06/02/22 55.2 kg    Ideal Body Weight:  59.1 kg  BMI:  Body mass index is 19.64 kg/m.  Estimated Nutritional Needs:   Kcal:  1500-1750  Protein:  75-100 grams  Fluid:  1.5-1.75 L/day   02-26-2005 MS, RDN, LDN, CNSC Registered Dietitian 3 Clinical Nutrition RD Pager and On-Call Pager Number Located in [place]

## 2022-06-02 NOTE — Progress Notes (Signed)
   06/02/22 1440  Clinical Encounter Type  Visited With Patient not available;Health care provider Gorden Harms, RN)  Visit Type Follow-up;Spiritual support;Critical Care  Referral From Physician;Nurse Quillian Quince C. Tamala Julian, MD; Lourdes Sledge, RN)  Consult/Referral To Chaplain  Recommendations Patient Requests Prayer  Spiritual Encounters  Spiritual Needs Prayer   Spiritual Care Consult: "Patient requests prayer" Chaplain stopped for visit. Patient resting comfortably. Sister was in room but on phone. Intercessory Prayer offered at patient's bedside. Patient's Nurse - Gorden Harms, RN suggested to attempt visit tomorrow. [address] [place], [place] [phone]

## 2022-06-02 NOTE — Progress Notes (Signed)
   05/31/22 0200  Clinical Encounter Type  Visited With Health care provider  Visit Type Follow-up (Referral from [place])  Referral From [place] [name])  Consult/Referral To Chaplain Melvenia Beam)  Recommendations Advance Directive Follow-Up   Follow-Up referral in regards to request for Advance Directive. Patient not resting.[address] [place], [place] [phone]

## 2022-06-02 NOTE — Progress Notes (Signed)
[facility] ADULT ICU REPLACEMENT PROTOCOL   The patient does apply for the [facility] Adult ICU Electrolyte Replacment Protocol based on the criteria listed below:   1.Exclusion criteria: TCTS patients, ECMO patients, and Dialysis patients 2. Is GFR >/= 30 ml/min? Yes.    Patient's GFR today is 43 3. Is SCr </= 2? Yes.   Patient's SCr is 1.47 mg/dL 4. Did SCr increase >/= 0.5 in 24 hours? No. 5.Pt's weight >40kg  Yes.   6. Abnormal electrolyte(s): K+ 3.5  7. Electrolytes replaced per protocol 8.  Call MD STAT for K+ </= 2.5, Phos </= 1, or Mag </= 1 Physician:  n/a  Darlys Gales 06/02/2022 4:44 AM

## 2022-06-02 NOTE — Progress Notes (Signed)
NAME:  Monica Hopkins, MRN:  [id number], DOB:  1971-02-12, LOS: 13 ADMISSION DATE:  05/19/2022, CONSULTATION DATE:  05/19/22 REFERRING MD:  Billy Fischer, CHIEF COMPLAINT:  found down, cardiac arrest    History of Present Illness:  51 yo woman with hx of bipolar disorder,  here after cardiac arrest.   Per family has been feeling sick and taking trazodone and benadryl at night. They thought she had covid a couple of weeks ago, then recovered somewhat.  For past two weeks has been sob intermittently with some minimal cough, fevers, sore throat.  Her husband came home and she was alert and talking, he went to the bathroom.  10- min later he came back to find her unconscious.  He started CPR immediately, EMS came fairly quickly.  She was reportedly found to be in PEA.  Given 1 mg epi, also lidocaine.  Found once to be in shockable rhythm, shocked once.  Rosc after about 26 min, while in route.   Per family has undergone large weight loss  a few years ago, unclear cause.  Patient had felt it was from lithium but unclear when last takng this..  Poor po intake from low appeitite, nausea and vomiting.  She apparently has not wanted to go to visit the dr.  She recently has only done phone visits with psychiatry.   Sleeps sitting up in recliner.   Per her mother, she has not felt well in years, has complained of some virus or other acute illness affecting her for the past year.   Intubated in ED ( Had king airway in place) at 820 per drug record.  In ED hypothermic  ABG 7.206/49/82 Cefepime and vanc ordered.   AKI cr 1.2 from 0.79 WBC 14.3   Home meds klonipin, trazodone depakote.   Pertinent  Medical History  Bipolar disorder Unexplained weight loss  Poor dentition.  Significant Hospital Events: Including procedures, antibiotic start and stop dates in addition to other pertinent events   10/17: admitted post cardiac arrest; ROSC 26 minutes 10/20: off pressors, will awaken agitated,  desaturates SBT.  10/23 failed extubation trial 10/25 trach  Interim History / Subjective:  FiO2 and PEEP was titrated down to 40%/8 Placed on spontaneous breathing trial, tolerating well so far  Objective   Blood pressure 120/80, pulse (!) 102, temperature 98.3 F (36.8 C), temperature source Oral, resp. rate (!) 24, height 5\' 6"  (1.676 m), weight 55.2 kg, SpO2 90 %.    Vent Mode: PRVC FiO2 (%):  [40 %-60 %] 60 % Set Rate:  [18 bmp] 18 bmp Vt Set:  [470 mL] 470 mL PEEP:  [8 cmH20-10 cmH20] 8 cmH20 Plateau Pressure:  [19 cmH20-28 cmH20] 22 cmH20   Intake/Output Summary (Last 24 hours) at 06/02/2022 1145 Last data filed at 06/02/2022 1100 Gross per 24 hour  Intake 2118.52 ml  Output 680 ml  Net 1438.52 ml   Filed Weights   05/30/22 0500 05/31/22 0600 06/02/22 0335  Weight: 57.6 kg 57.2 kg 55.2 kg    Examination: Physical exam: General: Chronically ill-appearing cachectic female, s/p trach HEENT: [place]/AT, eyes anicteric.  Cortrak in place Neuro: Awake, following commands, moving all 4 extremities Chest: Coarse breath sounds, no wheezes or rhonchi Heart: Regular rate and rhythm, no murmurs or gallops Abdomen: Soft, nontender, nondistended, bowel sounds present Skin: No rash  Assessment & Plan:  S/p cardiac arrest: found to be in PEA/shockable rhythm, in the setting of hypoxia due to pneumococcal pneumonia  Acute hypoxic respiratory failure  due to Strep pneumonia pneumonia, complicated with aspiration and healthcare associated pneumonia with Enterobacter, Klebsiella and stenotrophomonas now s/p trach FiO2 and PEEP to 40% and 8 respectively Remain afebrile after switching antibiotic to meropenem Placed on spontaneous breathing trial, tolerating well so far, will try trach collar  Acute septic/toxic encephalopathy-improved Mental status has significantly improved Patient is alert and awake and following commands Avoid deep sedation  New diagnosis of acute HFpEF  Monitor  intake and output  She looks euvolemic Hold diuretic  Severe protein calorie malnutrition Continue tube feeds Dietitian is following  Acute rhabdomyolysis, resolved Hyperkalemia/Hypokalemia, resolved AKI due to septic ATN, rhabdomyolysis Hypernatremia Serum creatinine slowly trending down Monitor intake and output Avoid nephrotoxic agent Closely monitor electrolytes Renal ultrasound showed no hydronephrosis Continue free water flushes now 200 every 2 hours  Anxiety depression bipolar PTA Rib fx from CPR Continue Klonopin, Depakote and Seroquel  Acute blood loss anemia on anemia of chronic disease Patient hemoglobin slowly trending down likely due to acute illness Now at 7.1, transfuse if drops below 7  Best Practice (right click and "Reselect all SmartList Selections" daily)   Diet/type: Tube feeds DVT prophylaxis: heparin subq GI prophylaxis: PPI Lines: Arterial Line Foley:  yes Code Status:  full code Last date of multidisciplinary goals of care discussion 10/30: Patient mother and sister were updated at bedside  Total critical care time: 32 minutes  Performed by: Jacky Kindle   Critical care time was exclusive of separately billable procedures and treating other patients.   Critical care was necessary to treat or prevent imminent or life-threatening deterioration.   Critical care was time spent personally by me on the following activities: development of treatment plan with patient and/or surrogate as well as nursing, discussions with consultants, evaluation of patient's response to treatment, examination of patient, obtaining history from patient or surrogate, ordering and performing treatments and interventions, ordering and review of laboratory studies, ordering and review of radiographic studies, pulse oximetry and re-evaluation of patient's condition.   Jacky Kindle, MD [facility] See Amion for pager If no response to pager, please call  [phone] until 7pm After 7pm, Please call E-link [phone]

## 2022-06-03 LAB — CBC
HCT: 23.5 % — ABNORMAL LOW (ref 36.0–46.0)
Hemoglobin: 7.3 g/dL — ABNORMAL LOW (ref 12.0–15.0)
MCH: 31.3 pg (ref 26.0–34.0)
MCHC: 31.1 g/dL (ref 30.0–36.0)
MCV: 100.9 fL — ABNORMAL HIGH (ref 80.0–100.0)
Platelets: 451 10*3/uL — ABNORMAL HIGH (ref 150–400)
RBC: 2.33 MIL/uL — ABNORMAL LOW (ref 3.87–5.11)
RDW: 15.9 % — ABNORMAL HIGH (ref 11.5–15.5)
WBC: 9.2 10*3/uL (ref 4.0–10.5)
nRBC: 0 % (ref 0.0–0.2)

## 2022-06-03 LAB — GLUCOSE, CAPILLARY
Glucose-Capillary: 111 mg/dL — ABNORMAL HIGH (ref 70–99)
Glucose-Capillary: 131 mg/dL — ABNORMAL HIGH (ref 70–99)
Glucose-Capillary: 92 mg/dL (ref 70–99)
Glucose-Capillary: 97 mg/dL (ref 70–99)

## 2022-06-03 LAB — BASIC METABOLIC PANEL
Anion gap: 9 (ref 5–15)
BUN: 61 mg/dL — ABNORMAL HIGH (ref 6–20)
CO2: 27 mmol/L (ref 22–32)
Calcium: 9.5 mg/dL (ref 8.9–10.3)
Chloride: 112 mmol/L — ABNORMAL HIGH (ref 98–111)
Creatinine, Ser: 1.17 mg/dL — ABNORMAL HIGH (ref 0.44–1.00)
GFR, Estimated: 56 mL/min — ABNORMAL LOW (ref >60)
Glucose, Bld: 94 mg/dL (ref 70–99)
Potassium: 3.8 mmol/L (ref 3.5–5.1)
Sodium: 148 mmol/L — ABNORMAL HIGH (ref 135–145)

## 2022-06-03 LAB — POCT I-STAT 7, (LYTES, BLD GAS, ICA,H+H)
Acid-Base Excess: 3 mmol/L — ABNORMAL HIGH (ref 0.0–2.0)
Bicarbonate: 26.9 mmol/L (ref 20.0–28.0)
Calcium, Ion: 1.36 mmol/L (ref 1.15–1.40)
HCT: 23 % — ABNORMAL LOW (ref 36.0–46.0)
Hemoglobin: 7.8 g/dL — ABNORMAL LOW (ref 12.0–15.0)
O2 Saturation: 86 %
Patient temperature: 99.3
Potassium: 3.8 mmol/L (ref 3.5–5.1)
Sodium: 147 mmol/L — ABNORMAL HIGH (ref 135–145)
TCO2: 28 mmol/L (ref 22–32)
pCO2 arterial: 38.1 mmHg (ref 32–48)
pH, Arterial: 7.458 — ABNORMAL HIGH (ref 7.35–7.45)
pO2, Arterial: 49 mmHg — ABNORMAL LOW (ref 83–108)

## 2022-06-03 MED ORDER — CLONAZEPAM 1 MG PO TABS
1.0000 mg | ORAL_TABLET | Freq: Once | ORAL | Status: AC
  Administered 2022-06-03: 1 mg
  Filled 2022-06-03: qty 1

## 2022-06-03 NOTE — Progress Notes (Signed)
Pharmacy Antibiotic Note  Monica Hopkins is a 51 y.o. female admitted on 05/19/2022 s/p cardiac arrest. Pt was previously treated for pneumonia with ceftraixone. VDRF > trach 10/25 10/28 TA grew stenotrophanomus + enterobacter  Zosyn changed to meropenem   Wbc 26> wnl afebrile, Cr 1.1, crcl about 20ml/min, wt 50kg   Plan: Meropenem 1gm q12h    Height: 5\' 6"  (167.6 cm) Weight: 56.3 kg (124 lb 1.9 oz) IBW/kg (Calculated) : 59.3, Temp (24hrs), Avg:98.5 F (36.9 C), Min:98 F (36.7 C), Max:99.3 F (37.4 C)  Recent Labs  Lab 05/29/22 0505 05/30/22 0520 05/31/22 0538 06/02/22 0343 06/03/22 0439  WBC 25.0* 26.3* 26.0* 19.3* 9.2  CREATININE 1.93* 1.90* 1.77* 1.47* 1.17*     Estimated Creatinine Clearance: 50.6 mL/min (A) (by C-G formula based on SCr of 1.17 mg/dL (H)).    Allergies  Allergen Reactions   Abilify [Aripiprazole] Swelling and Rash   Lamictal [Lamotrigine] Swelling and Rash   Latuda [Lurasidone] Other (See Comments)    Caused leg weakness     Bonnita Nasuti Pharm.D. CPP, BCPS Clinical Pharmacist [phone] 06/03/2022 11:56 AM   **Pharmacist phone directory can now be found on [email or web address] ([email or web address]).  Listed under [place].

## 2022-06-03 NOTE — Progress Notes (Addendum)
Physical Therapy Treatment Patient Details Name: Monica Hopkins MRN: [id number] DOB: 1970/11/05 Today's Date: 06/03/2022   History of Present Illness Pt is a 51 y.o. female admitted 05/19/22 with cardiac arrest at home, ROSC 26 min, hypothermic. Intubated 10/17, failed extubation 10/23; trach placed 10/25. Pt with new HFpEF. PMH includes bipolar disorder.   PT Comments    Pt progressing with mobility. Today's session focused on out of bed transfer to recliner; pt tolerated maxisky transfer well, though significant increase in distractibility once seated in recliner, therefore standing trials deferred. Pt remains limited by generalized weakness, decreased activity tolerance, poor balance strategies/postural reactions and impaired cognition, including decreased awareness, poor attention and difficulty problem solving. Continue to recommend SNF-level therapies to maximize functional mobility and independence prior to return home.  HR 82 SpO2 95% on 10L O2 via trach collar 60% FiO2    Recommendations for follow up therapy are one component of a multi-disciplinary discharge planning process, led by the attending physician.  Recommendations may be updated based on patient status, additional functional criteria and insurance authorization.  Follow Up Recommendations  Skilled nursing-short term rehab (<3 hours/day) Can patient physically be transported by private vehicle: No   Assistance Recommended at Discharge Frequent or constant Supervision/Assistance  Patient can return home with the following Two people to help with walking and/or transfers;Two people to help with bathing/dressing/bathroom;Direct supervision/assist for medications management;Help with stairs or ramp for entrance;Assist for transportation;Assistance with cooking/housework;Direct supervision/assist for financial management;Assistance with feeding   Equipment Recommendations  Wheelchair (measurements PT);Hospital bed;Wheelchair  cushion (measurements PT)    Recommendations for Other Services       Precautions / Restrictions Precautions Precautions: Fall;Other (comment) Precaution Comments: flexiseal, cortrak, trach collar Restrictions Weight Bearing Restrictions: No     Mobility  Bed Mobility Overal bed mobility: Needs Assistance Bed Mobility: Rolling Rolling: Max assist         General bed mobility comments: cues for initiation and sequencing, pt assisting by holding onto bedrails with repeated cues    Transfers Overall transfer level: Needs assistance Equipment used: Ambulation equipment used Transfers: Bed to chair/wheelchair/BSC             General transfer comment: tolerated transfer to recliner via maxisky lift well. deferred standing trial from chair due to increased restlessness and distraction, difficult to redirect Transfer via Lift Equipment: Maxisky  Ambulation/Gait                   Stairs             Wheelchair Mobility    Modified Rankin (Stroke Patients Only)       Balance Overall balance assessment: Needs assistance Sitting-balance support: Bilateral upper extremity supported, Feet supported Sitting balance-Leahy Scale: Poor Sitting balance - Comments: poor trunk and head control, though improving from prior sessions; LOB anteriorly and in all directions in chair requiring assist to correct and reposition to encourage neutral/safe alignment                                    Cognition Arousal/Alertness: Awake/alert Behavior During Therapy: Restless Overall Cognitive Status: Impaired/Different from baseline Area of Impairment: Attention, Memory, Following commands, Safety/judgement, Awareness, Problem solving                   Current Attention Level: Sustained Memory: Decreased recall of precautions, Decreased short-term memory Following Commands: Follows one step commands with increased time,  Follows one step commands  inconsistently Safety/Judgement: Decreased awareness of deficits, Decreased awareness of safety Awareness: Emergent Problem Solving: Slow processing, Requires verbal cues General Comments: alert and talking upon arrival. Requires low distraction environment and cues for attention, internally distracted by pain and news from MD (transferring ICU, IPR, pulling flexi). perseverating on those topics listed thorughout session.        Exercises Other Exercises Other Exercises: BLE AAROM heel slides, LAQ, SLR - noted increased tightness in LLE    General Comments General comments (skin integrity, edema, etc.): pt's mom Monica Hopkins) present and supportive. SpO2 95% on 10L O2 via trach collar at 60% FiO2; HR 80s      Pertinent Vitals/Pain Pain Assessment Pain Assessment: Faces Faces Pain Scale: Hurts a little bit Pain Location: rectal area/flexiseal Pain Descriptors / Indicators: Discomfort, Grimacing Pain Intervention(s): Monitored during session, Limited activity within patient's tolerance, Repositioned    Home Living                          Prior Function            PT Goals (current goals can now be found in the care plan section) Progress towards PT goals: Progressing toward goals    Frequency    Min 3X/week      PT Plan Current plan remains appropriate    Co-evaluation PT/OT/SLP Co-Evaluation/Treatment: Yes Reason for Co-Treatment: Complexity of the patient's impairments (multi-system involvement);Necessary to address cognition/behavior during functional activity;For patient/therapist safety;To address functional/ADL transfers   OT goals addressed during session: ADL's and self-care      AM-PAC PT "6 Clicks" Mobility   Outcome Measure  Help needed turning from your back to your side while in a flat bed without using bedrails?: Total Help needed moving from lying on your back to sitting on the side of a flat bed without using bedrails?: Total Help needed  moving to and from a bed to a chair (including a wheelchair)?: Total Help needed standing up from a chair using your arms (e.g., wheelchair or bedside chair)?: Total Help needed to walk in hospital room?: Total Help needed climbing 3-5 steps with a railing? : Total 6 Click Score: 6    End of Session Equipment Utilized During Treatment: Oxygen Activity Tolerance: Patient tolerated treatment well;Other (comment) (limited by cognition) Patient left: in chair;with call bell/phone within reach Nurse Communication: Mobility status;Need for lift equipment PT Visit Diagnosis: Other abnormalities of gait and mobility (R26.89);Muscle weakness (generalized) (M62.81);Unsteadiness on feet (R26.81);Other symptoms and signs involving the nervous system (M19.622)     Time: 2979-8921 PT Time Calculation (min) (ACUTE ONLY): 24 min  Charges:  $Therapeutic Activity: 8-22 mins                     Mabeline Caras, PT, DPT Acute Rehabilitation Services  Personal: [facility] Office: [place] 06/03/2022, 11:37 AM

## 2022-06-03 NOTE — Progress Notes (Signed)
Ms. Monica Hopkins was very vocal tonight despite being trached. She seems to be ready for a [employer] Valve. She is also quite talkative, but gets frustrated with trying to communicate effectively. She refused her purewick late in the shift and would like to have her fecal management system removed. The RN educated her on reason why keeping it was important right now with her pressure injury and incontinence of both urine and stool. The RN tried to comfort her and was patient while communicating with her using the white board and working to make sure she was understood. Day shift RN updated on the night's events.

## 2022-06-03 NOTE — Progress Notes (Signed)
Occupational Therapy Treatment Patient Details Name: Monica Hopkins MRN: [id number] DOB: 03-Feb-1971 Today's Date: 06/03/2022   History of present illness Pt is a 51 y.o. female admitted 05/19/22 with cardiac arrest at home, ROSC 26 min, hypothermic. Intubated 10/17, failed extubation 10/23; trach placed 10/25. Pt with new HFpEF. PMH includes bipolar disorder.   OT comments  Pt making good progress, she was seen with PT to safetly progress OOB. Upon arrival she was alert and communicative. She required cues throughout for attention, initiation and sequencing of all tasks. Overall she required max A for rolling and total +2 for MaxiSky transfer OOB to the chair. She remains limited by generalized weakness with poor trunk control. She was supported with pillows for upright positioning in the chair. OT to continue to follow. D/c remains appropriate, if patient continues to make solid progression d/c venue may need to be update.    Recommendations for follow up therapy are one component of a multi-disciplinary discharge planning process, led by the attending physician.  Recommendations may be updated based on patient status, additional functional criteria and insurance authorization.    Follow Up Recommendations  OT at Long-term acute care hospital (pending acute progression)    Assistance Recommended at Discharge Frequent or constant Supervision/Assistance  Patient can return home with the following  Two people to help with walking and/or transfers;Two people to help with bathing/dressing/bathroom   Equipment Recommendations  Other (comment)       Precautions / Restrictions Precautions Precautions: Fall;Other (comment) Precaution Comments: flexiseal, cortrak, trach Restrictions Weight Bearing Restrictions: No       Mobility Bed Mobility Overal bed mobility: Needs Assistance Bed Mobility: Rolling Rolling: Max assist         General bed mobility comments: cues for initiation and  sequencing    Transfers Overall transfer level: Needs assistance Equipment used: Ambulation equipment used Transfers: Bed to chair/wheelchair/BSC               Transfer via Lift Equipment: Maxisky   Balance Overall balance assessment: Needs assistance Sitting-balance support: Bilateral upper extremity supported, Feet supported Sitting balance-Leahy Scale: Poor Sitting balance - Comments: poor trunk and head control                                   ADL either performed or assessed with clinical judgement   ADL Overall ADL's : Needs assistance/impaired Eating/Feeding: Maximal assistance   Grooming: Maximal assistance           Upper Body Dressing : Minimal assistance       Toilet Transfer: Total assistance;+2 for physical assistance;+2 for safety/equipment   Toileting- Clothing Manipulation and Hygiene: Total assistance       Functional mobility during ADLs: Total assistance;+2 for physical assistance;+2 for safety/equipment General ADL Comments: pt requires supported sitting for UB ADLs due to poor turnk control. maxi ski hoyer used for OOB transfer    Extremity/Trunk Assessment Upper Extremity Assessment Upper Extremity Assessment: RUE deficits/detail;LUE deficits/detail RUE Deficits / Details: 2/5 shoulder, 3/5 elbow, 4/5 gross grip RUE Coordination: decreased fine motor;decreased gross motor LUE Deficits / Details: 2/5 shoulder, 3/5 elbow, 3+/5 gross grip LUE Coordination: decreased fine motor;decreased gross motor   Lower Extremity Assessment Lower Extremity Assessment: Defer to PT evaluation        Vision   Vision Assessment?: No apparent visual deficits   Perception Perception Perception: Not tested   Praxis Praxis Praxis: Not tested  Cognition Arousal/Alertness: Awake/alert Behavior During Therapy: Restless Overall Cognitive Status: Impaired/Different from baseline Area of Impairment: Attention, Memory, Following  commands, Safety/judgement, Awareness, Problem solving                   Current Attention Level: Sustained Memory: Decreased recall of precautions, Decreased short-term memory Following Commands: Follows one step commands with increased time, Follows one step commands inconsistently Safety/Judgement: Decreased awareness of deficits, Decreased awareness of safety Awareness: Emergent Problem Solving: Slow processing, Requires verbal cues General Comments: alert and talking upon arrival. Requires low distraction environment and cues for attention, internally distracted by pain and news from MD (transferring ICU, IPR, pulling flexi). perseverating on those topics listed thorughout session.        Exercises      Shoulder Instructions       General Comments VSS    Pertinent Vitals/ Pain       Pain Assessment Pain Assessment: Faces Faces Pain Scale: Hurts a little bit Pain Location: rectal area/flexi Pain Descriptors / Indicators: Discomfort, Grimacing Pain Intervention(s): Limited activity within patient's tolerance, Monitored during session  Home Living                                          Prior Functioning/Environment              Frequency  Min 2X/week        Progress Toward Goals  OT Goals(current goals can now be found in the care plan section)  Progress towards OT goals: Progressing toward goals  Acute Rehab OT Goals OT Goal Formulation: With patient Time For Goal Achievement: 06/12/22 Potential to Achieve Goals: Good ADL Goals Pt Will Perform Upper Body Dressing: with min assist;sitting Pt Will Transfer to Toilet: with mod assist;stand pivot transfer;bedside commode Pt/caregiver will Perform Home Exercise Program: Both right and left upper extremity;Increased strength;With minimal assist Additional ADL Goal #1: Pt will demonstrate fair sitting balance at EOB. Additional ADL Goal #2: Pt will demonstrate selective attention.   Plan Discharge plan remains appropriate    Co-evaluation    PT/OT/SLP Co-Evaluation/Treatment: Yes Reason for Co-Treatment: Complexity of the patient's impairments (multi-system involvement);For patient/therapist safety;To address functional/ADL transfers   OT goals addressed during session: ADL's and self-care      AM-PAC OT "6 Clicks" Daily Activity     Outcome Measure   Help from another person eating meals?: A Lot Help from another person taking care of personal grooming?: A Lot Help from another person toileting, which includes using toliet, bedpan, or urinal?: Total Help from another person bathing (including washing, rinsing, drying)?: A Lot Help from another person to put on and taking off regular upper body clothing?: A Lot Help from another person to put on and taking off regular lower body clothing?: Total 6 Click Score: 10    End of Session Equipment Utilized During Treatment: Oxygen  OT Visit Diagnosis: Unsteadiness on feet (R26.81);Other abnormalities of gait and mobility (R26.89);Muscle weakness (generalized) (M62.81);Other symptoms and signs involving cognitive function;Pain   Activity Tolerance Patient tolerated treatment well   Patient Left in chair;with call bell/phone within reach;with chair alarm set;with family/visitor present   Nurse Communication Mobility status        Time: 8416-6063 OT Time Calculation (min): 23 min  Charges: OT General Charges $OT Visit: 1 Visit OT Treatments $Therapeutic Activity: 8-22 mins    Elliot Cousin  06/03/2022, 10:28 AM

## 2022-06-03 NOTE — Evaluation (Signed)
[facility] - Evaluation Patient Details  Name: Monica Hopkins MRN: [id number] Date of Birth: 06-06-1971  Today's Date: 06/03/2022 Time: 1043-1101 SLP Time Calculation (min) (ACUTE ONLY): 18 min  Past Medical History:  Past Medical History:  Diagnosis Date   Prolapse, disk    Past Surgical History:  Past Surgical History:  Procedure Laterality Date   NECK SURGERY     HPI:  Pt is a 51 y.o. female admitted 05/19/22 after cardiac arrest at home, ROSC 26 min, hypothermic. ETT 10/17-10/23; reintubated 10/23-trach10/25. Pt with new HFpEF. MRI brain negative for acute changes. CXR 10/30: Stable multifocal pulmonary infiltrates and small left pleural effusion. PMH: bipolar disorder.    Assessment / Plan / Recommendation  Clinical Impression  Pt was seen for PMSV evaluation with her mother present for most of it. Pt was speaking upon SLP's arrival, but speech intelligibility was negatively impacted by reduced vocal intensity and a breathy vocal quality. Pt presented with vitals of RR 24, SpO2 87, and HR 97 upon SLP's entry. Pt's RN reported that SpO2 had been in the 90s prior. Tracheal suctioning was provided and SLP removed the remainder of air from the cuff during suctioning. Following suctioning, pt's SpO2 was 83-86%; RT was contacted by RN to further assess pt and RT indicated that a repeat ABG may be completed subsequently. PMSV placement was deferred during this session, but SLP will follow pt. SLP Visit Diagnosis: [name] (R49.1)    SLP Assessment  Patient needs continued [facility]    Recommendations for follow up therapy are one component of a multi-disciplinary discharge planning process, led by the attending physician.  Recommendations may be updated based on patient status, additional functional criteria and insurance authorization.  Follow Up Recommendations  Skilled nursing-short term rehab (<3 hours/day)    Assistance Recommended at  Discharge Frequent or constant Supervision/Assistance  Functional Status Assessment Patient has had a recent decline in their functional status and demonstrates the ability to make significant improvements in function in a reasonable and predictable amount of time.  Frequency and Duration min 2x/week  2 weeks    PMSV Trial PMSV was placed for: PMSV placement deferred   Tracheostomy Tube       Vent Dependency  FiO2 (%): 60 %    Cuff Deflation Trial Tolerated Cuff Deflation: Yes Length of Time for Cuff Deflation Trial: partially deflated at baseline Behavior: Alert;Good eye contact         Milana Salay I. Hardin Negus, [place], [place] Office number [phone]  Horton Marshall 06/03/2022, 11:14 AM

## 2022-06-03 NOTE — Progress Notes (Signed)
NAME:  Monica Hopkins, MRN:  [id number], DOB:  12/23/1970, LOS: 15 ADMISSION DATE:  05/19/2022, CONSULTATION DATE:  05/19/22 REFERRING MD:  Dalene Seltzer, CHIEF COMPLAINT:  found down, cardiac arrest    History of Present Illness:  51 yo woman with hx of bipolar disorder,  here after cardiac arrest.   Per family has been feeling sick and taking trazodone and benadryl at night. They thought she had covid a couple of weeks ago, then recovered somewhat.  For past two weeks has been sob intermittently with some minimal cough, fevers, sore throat.  Her husband came home and she was alert and talking, he went to the bathroom.  10- min later he came back to find her unconscious.  He started CPR immediately, EMS came fairly quickly.  She was reportedly found to be in PEA.  Given 1 mg epi, also lidocaine.  Found once to be in shockable rhythm, shocked once.  Rosc after about 26 min, while in route.   Per family has undergone large weight loss  a few years ago, unclear cause.  Patient had felt it was from lithium but unclear when last takng this..  Poor po intake from low appeitite, nausea and vomiting.  She apparently has not wanted to go to visit the dr.  She recently has only done phone visits with psychiatry.   Sleeps sitting up in recliner.   Per her mother, she has not felt well in years, has complained of some virus or other acute illness affecting her for the past year.   Intubated in ED ( Had king airway in place) at 820 per drug record.  In ED hypothermic  ABG 7.206/49/82 Cefepime and vanc ordered.   AKI cr 1.2 from 0.79 WBC 14.3   Home meds klonipin, trazodone depakote.   Pertinent  Medical History  Bipolar disorder Unexplained weight loss  Poor dentition.  Significant Hospital Events: Including procedures, antibiotic start and stop dates in addition to other pertinent events   10/17: admitted post cardiac arrest; ROSC 26 minutes 10/20: off pressors, will awaken agitated,  desaturates SBT.  10/23 failed extubation trial 10/25 trach  Interim History / Subjective:  Patient remained on trach collar for 24 hours Very anxious and restless, intermittently desats but oxygen saturation improves when she is calm and relax  Objective   Blood pressure (!) 123/90, pulse 83, temperature 98 F (36.7 C), temperature source Oral, resp. rate (!) 24, height 5\' 6"  (1.676 m), weight 56.3 kg, SpO2 100 %.    FiO2 (%):  [50 %-60 %] 50 %   Intake/Output Summary (Last 24 hours) at 06/03/2022 1056 Last data filed at 06/03/2022 0800 Gross per 24 hour  Intake 1990 ml  Output 1285 ml  Net 705 ml   Filed Weights   05/31/22 0600 06/02/22 0335 06/03/22 0500  Weight: 57.2 kg 55.2 kg 56.3 kg    Examination: Physical exam: General: Chronically ill-appearing cachectic female, s/p trach HEENT: [place]/AT, eyes anicteric.  Cortrak in place.  S/p trach Neuro: Awake, following commands, moving all 4 extremities Chest: Coarse breath sounds, no wheezes or rhonchi Heart: Regular rate and rhythm, no murmurs or gallops Abdomen: Soft, nontender, nondistended, bowel sounds present Skin: No rash  Assessment & Plan:  S/p cardiac arrest: found to be in PEA/shockable rhythm, in the setting of hypoxia due to pneumococcal pneumonia  Acute hypoxic respiratory failure due to Strep pneumonia pneumonia, complicated with aspiration and healthcare associated pneumonia with Enterobacter, Klebsiella and stenotrophomonas now s/p trach Patient tolerated  trach collar for 24 hours, intermittently desats due to agitation and restlessness but oxygen saturation improves when she is calm and relaxed Continue meropenem Continue trach collar as long as patient can tolerate  Acute septic/toxic encephalopathy-improved Mental status has significantly improved, she is anxious at baseline Patient is alert and awake and following commands Avoid deep sedation Decrease Klonopin to 1 mg in the morning and 2 mg at  bedtime Change Dilaudid to as needed  New diagnosis of acute HFpEF  Monitor intake and output  She looks euvolemic Hold diuretic  Severe protein calorie malnutrition Continue tube feeds Dietitian is following  Acute rhabdomyolysis, resolved Hyperkalemia/Hypokalemia, resolved AKI due to septic ATN, rhabdomyolysis Hypernatremia Serum creatinine to improve Monitor intake and output Avoid nephrotoxic agent Closely monitor electrolytes Serum sodium is trending down, currently at 148 Continue free water flushes now 200 every 2 hours  Anxiety depression bipolar PTA Rib fx from CPR Continue Klonopin, Depakote and Seroquel  Acute blood loss anemia on anemia of chronic disease Patient hemoglobin has stabilized now Monitor H&H and transfuse if less than 7  Best Practice (right click and "Reselect all SmartList Selections" daily)   Diet/type: Tube feeds DVT prophylaxis: heparin subq GI prophylaxis: PPI Lines: Arterial Line Foley:  yes Code Status:  full code Last date of multidisciplinary goals of care discussion 11/1: Patient's mother was updated at bedside    Jacky Kindle, MD [facility] See Amion for pager If no response to pager, please call [phone] until 7pm After 7pm, Please call E-link [phone]

## 2022-06-03 NOTE — Progress Notes (Signed)
O2 saturation goal is 85% or greater per Dr. Tacy Learn. FIO2 was increased to 80% on ATC per Dr. Tacy Learn based on ABG results.

## 2022-06-04 ENCOUNTER — Inpatient Hospital Stay (HOSPITAL_COMMUNITY): Payer: Medicaid Other

## 2022-06-04 LAB — CBC WITH DIFFERENTIAL/PLATELET
Abs Immature Granulocytes: 0.11 10*3/uL — ABNORMAL HIGH (ref 0.00–0.07)
Basophils Absolute: 0.1 10*3/uL (ref 0.0–0.1)
Basophils Relative: 1 %
Eosinophils Absolute: 0.2 10*3/uL (ref 0.0–0.5)
Eosinophils Relative: 2 %
HCT: 24.4 % — ABNORMAL LOW (ref 36.0–46.0)
Hemoglobin: 7.9 g/dL — ABNORMAL LOW (ref 12.0–15.0)
Immature Granulocytes: 1 %
Lymphocytes Relative: 19 %
Lymphs Abs: 2 10*3/uL (ref 0.7–4.0)
MCH: 31.2 pg (ref 26.0–34.0)
MCHC: 32.4 g/dL (ref 30.0–36.0)
MCV: 96.4 fL (ref 80.0–100.0)
Monocytes Absolute: 0.6 10*3/uL (ref 0.1–1.0)
Monocytes Relative: 5 %
Neutro Abs: 7.9 10*3/uL — ABNORMAL HIGH (ref 1.7–7.7)
Neutrophils Relative %: 72 %
Platelets: 546 10*3/uL — ABNORMAL HIGH (ref 150–400)
RBC: 2.53 MIL/uL — ABNORMAL LOW (ref 3.87–5.11)
RDW: 15.5 % (ref 11.5–15.5)
WBC: 10.9 10*3/uL — ABNORMAL HIGH (ref 4.0–10.5)
nRBC: 0 % (ref 0.0–0.2)

## 2022-06-04 LAB — GLUCOSE, CAPILLARY
Glucose-Capillary: 102 mg/dL — ABNORMAL HIGH (ref 70–99)
Glucose-Capillary: 103 mg/dL — ABNORMAL HIGH (ref 70–99)
Glucose-Capillary: 83 mg/dL (ref 70–99)
Glucose-Capillary: 87 mg/dL (ref 70–99)
Glucose-Capillary: 92 mg/dL (ref 70–99)
Glucose-Capillary: 97 mg/dL (ref 70–99)

## 2022-06-04 LAB — COMPREHENSIVE METABOLIC PANEL
ALT: 25 U/L (ref 0–44)
AST: 26 U/L (ref 15–41)
Albumin: 2.2 g/dL — ABNORMAL LOW (ref 3.5–5.0)
Alkaline Phosphatase: 63 U/L (ref 38–126)
Anion gap: 11 (ref 5–15)
BUN: 62 mg/dL — ABNORMAL HIGH (ref 6–20)
CO2: 25 mmol/L (ref 22–32)
Calcium: 9.8 mg/dL (ref 8.9–10.3)
Chloride: 108 mmol/L (ref 98–111)
Creatinine, Ser: 1.05 mg/dL — ABNORMAL HIGH (ref 0.44–1.00)
GFR, Estimated: >60 mL/min (ref >60)
Glucose, Bld: 101 mg/dL — ABNORMAL HIGH (ref 70–99)
Potassium: 3.7 mmol/L (ref 3.5–5.1)
Sodium: 144 mmol/L (ref 135–145)
Total Bilirubin: 0.4 mg/dL (ref 0.3–1.2)
Total Protein: 6.1 g/dL — ABNORMAL LOW (ref 6.5–8.1)

## 2022-06-04 LAB — MAGNESIUM: Magnesium: 1.9 mg/dL (ref 1.7–2.4)

## 2022-06-04 LAB — PHOSPHORUS: Phosphorus: 3.6 mg/dL (ref 2.5–4.6)

## 2022-06-04 MED ORDER — SULFAMETHOXAZOLE-TRIMETHOPRIM 800-160 MG PO TABS
2.0000 | ORAL_TABLET | Freq: Two times a day (BID) | ORAL | Status: AC
  Administered 2022-06-04 – 2022-06-17 (×28): 2 via ORAL
  Filled 2022-06-04 (×30): qty 2

## 2022-06-04 MED ORDER — HYDROMORPHONE HCL 1 MG/ML IJ SOLN
0.5000 mg | INTRAMUSCULAR | Status: DC | PRN
  Administered 2022-06-05 (×3): 0.5 mg via INTRAVENOUS
  Filled 2022-06-04 (×3): qty 0.5

## 2022-06-04 MED ORDER — HYDROMORPHONE HCL 1 MG/ML IJ SOLN
0.5000 mg | INTRAMUSCULAR | Status: AC
  Administered 2022-06-04: 0.5 mg via INTRAVENOUS
  Filled 2022-06-04: qty 0.5

## 2022-06-04 MED ORDER — GUAIFENESIN 100 MG/5ML PO LIQD
5.0000 mL | ORAL | Status: DC | PRN
  Administered 2022-06-13 – 2022-06-18 (×3): 5 mL
  Filled 2022-06-04 (×3): qty 10

## 2022-06-04 MED ORDER — SENNOSIDES-DOCUSATE SODIUM 8.6-50 MG PO TABS
1.0000 | ORAL_TABLET | Freq: Every day | ORAL | Status: DC
  Administered 2022-06-04 – 2022-06-23 (×10): 1 via ORAL
  Filled 2022-06-04 (×11): qty 1

## 2022-06-04 MED ORDER — LEVALBUTEROL HCL 0.63 MG/3ML IN NEBU
0.6300 mg | INHALATION_SOLUTION | Freq: Four times a day (QID) | RESPIRATORY_TRACT | Status: DC
  Administered 2022-06-04 – 2022-06-05 (×4): 0.63 mg via RESPIRATORY_TRACT
  Filled 2022-06-04 (×5): qty 3

## 2022-06-04 MED ORDER — SODIUM CHLORIDE 3 % IN NEBU
4.0000 mL | INHALATION_SOLUTION | Freq: Four times a day (QID) | RESPIRATORY_TRACT | Status: DC
  Administered 2022-06-04 – 2022-06-05 (×4): 4 mL via RESPIRATORY_TRACT
  Filled 2022-06-04 (×11): qty 4

## 2022-06-04 NOTE — Progress Notes (Signed)
   06/04/22 1150  Clinical Encounter Type  Visited With Patient  Visit Type Initial;Spiritual support  Referral From Nurse  Consult/Referral To Chaplain   Chaplain responded to a spiritual consult for prayer. The patient, Monica Hopkins, opened up about the burden she carries with her health, the concern she has for her sons the love she holds for her youngest son. He feels so alone and Monica Hopkins takes on the burden that it is her fault he feels as he does. She knows that she must take care of herself but she doesn't follow through. Sometimes she stated I just want to fall asleep and stay that way. I encouraged her to keep going and that life is hard. We prayed accordingly.   I will continue to monitor.   Danice Goltz [facility]  [phone]

## 2022-06-04 NOTE — Progress Notes (Signed)
PROGRESS NOTE    Monica Hopkins  UUV:[id number] DOB: 28-Nov-1970 DOA: 05/19/2022 PCP: Kaleen Mask, MD   Brief Narrative:  Patient is a 51 year old Caucasian female with a past medical history significant for but limited to bipolar disorder who after cardiac arrest.  Per her family she had been feeling sick and taking trazodone and Benadryl at night.  They thought she had COVID a couple weeks ago and then recovered somewhat.  The last few weeks she has been short of breath intermittently with a mild cough fevers and sore throat.  Her husband came home she is alert and talking and then he went about a minute about 10 minutes later he got back to find her unconscious.  He started CPR immediately and EMS came fairly quickly.  She is reportedly found in PEA arrest and was given 1 mg of epi as well as lidocaine.  Then subsequently she is found wants to be in a shockable rhythm and was shocked and received ROSC after about 26 minutes while in route.  Per the family she is gone under a large weight loss a few years ago with unclear cause and has felt it was from the lithium but is unclear when she last taken this.  She has had poor p.o. intake from low appetite nausea vomiting and she apparently not wanted to go to the physician and had only recently done a outpatient follow-up with psychiatry.  Normally she sleeps in recliner.  In the ED when she was born and she is intubated and was hypothermic.  ABG was 7.206 with a PCO2 49 and O2 level of 82.  She was given IV vancomycin and cefepime and was noted to have AKI.  Home medications include Klonopin and trazodone.  Currently she was admitted and significant hospital events included  Significant Hospital Events: Including procedures, antibiotic start and stop dates in addition to other pertinent events   10/17: admitted post cardiac arrest; ROSC 26 minutes 10/20: off pressors, will awaken agitated, desaturates SBT.  10/23 failed extubation trial 10/25  trach 06/04/2022 she was transferred to the [facility] service 06/04/22 she desaturated on trach collar and PCCM was reconsulted.  Assessment and Plan: No notes have been filed under this hospital service. Service: Hospitalist  S/p cardiac arrest: found to be in PEA/shockable rhythm, in the setting of hypoxia due to pneumococcal pneumonia  Acute hypoxic respiratory failure due to Strep pneumonia pneumonia, complicated with aspiration and healthcare associated pneumonia with Enterobacter, Klebsiella and stenotrophomonas now s/p trach -Patient tolerated trach collar for 24 hours, intermittently desats due to agitation and restlessness but oxygen saturation improves when she is calm and relaxed: Desaturated significantly to 70% today for about half an hour so PCCM was reconsulted and subsequently she improved with suctioning -Meropenem changed to Bactrim given improvement in renal function -Continue trach collar as long as patient can tolerate; PCCM will monitor weekly -Chest x-ray today showed "Progression of right lower lobe airspace disease. Progressive consolidation left lower lobe with overall decreased size of infiltrate." -SLP working with Passy-Muir valve -Continue pulm toileting and added Xopenex in addition to pupillary -SLP recommending continuing n.p.o. and ice chips as needed after oral care   Acute septic/toxic encephalopathy-improved -Mental status has significantly improved, she is extremely anxious at baseline -Patient is alert and awake and following commands -Avoid deep sedation -Decrease Klonopin to 1 mg in the morning and 2 mg at bedtime -Change Dilaudid to as needed   New diagnosis of acute HFpEF  -Monitor intake  and output  -She looks euvolemic -Hold diuretic for now -Chest x-ray done today and showed above -Continue monitor for signs and symptoms of volume overload   Severe protein calorie malnutrition -Nutrition Status: Nutrition Problem: Severe Malnutrition Etiology:  social / environmental circumstances (poor PO intake related to poor dentition after taking lithium) Signs/Symptoms: severe fat depletion, severe muscle depletion Interventions: Refer to RD note for recommendations -Per my discussion with dietary today she will likely need a PEG tube and will discuss with the patient and family tomorrow   Acute rhabdomyolysis, resolved Hyperkalemia/Hypokalemia, resolved AKI due to septic ATN, rhabdomyolysis Hypernatremia Serum creatinine continues to improve Monitor intake and output -Avoid further nephrotoxic medications, contrast dyes, hypotension and dehydration ensure adequate renal perfusion and renally dose medications -Patient's BUNs/creatinine is now 62/1.05 and improved significantly from a few days ago when it was 69/1.47 Serum sodium is trending down, currently at 148 yesterday and today is 144 Continue free water flushes now 200 every 2 hours   Anxiety depression bipolar PTA Rib fx from CPR -Continue Klonopin, Depakote and Seroquel   Acute blood loss anemia on anemia of chronic disease -Patient hemoglobin has stabilized now -Monitor H&H and transfuse if less than 7 -Hemoglobin/hematocrit is now 7.9/24.4 and stable  Thrombocytosis -The patient's platelet count went from 413 and trended up to 546 -Continue monitor and trend and repeat CBC in a.m.  Hypoalbuminemia -The patient albumin level is now gone from 1.9 is now 2.2 -Continue to monitor trend and repeat CMP in a.m.  DVT prophylaxis: heparin injection 5,000 Units Start: 05/19/22 2200 SCDs Start: 05/19/22 2151    Code Status: Full Code Family Communication: No family currently at bedside  Disposition Plan:  Level of care: Progressive Status is: Inpatient Remains inpatient appropriate because: Needs further clinical improvement and will need discussion about PEG tube tomorrow   Consultants:  PCCM transfer  Procedures:  As delineated as above  Antimicrobials:   Anti-infectives (From admission, onward)    Start     Dose/Rate Route Frequency Ordered Stop   06/04/22 1045  sulfamethoxazole-trimethoprim (BACTRIM DS) 800-160 MG per tablet 2 tablet        2 tablet Oral Every 12 hours 06/04/22 0950     06/01/22 1030  meropenem (MERREM) 2 g in sodium chloride 0.9 % 100 mL IVPB  Status:  Discontinued        2 g 280 mL/hr over 30 Minutes Intravenous Every 12 hours 06/01/22 0933 06/04/22 0950   05/27/22 1600  piperacillin-tazobactam (ZOSYN) IVPB 3.375 g  Status:  Discontinued        3.375 g 12.5 mL/hr over 240 Minutes Intravenous Every 8 hours 05/27/22 1018 06/01/22 0930   05/27/22 1130  vancomycin (VANCOREADY) IVPB 1250 mg/250 mL  Status:  Discontinued        1,250 mg 166.7 mL/hr over 90 Minutes Intravenous Every 24 hours 05/27/22 1036 05/27/22 1037   05/27/22 1130  vancomycin (VANCOREADY) IVPB 1250 mg/250 mL  Status:  Discontinued        1,250 mg 166.7 mL/hr over 90 Minutes Intravenous Every 48 hours 05/27/22 1037 05/28/22 0832   05/27/22 1115  vancomycin (VANCOCIN) IVPB 1000 mg/200 mL premix  Status:  Discontinued        1,000 mg 200 mL/hr over 60 Minutes Intravenous  Once 05/27/22 1018 05/27/22 1036   05/27/22 1115  piperacillin-tazobactam (ZOSYN) IVPB 3.375 g        3.375 g 100 mL/hr over 30 Minutes Intravenous  Once 05/27/22 1018 05/27/22  1119   05/20/22 2200  vancomycin (VANCOREADY) IVPB 750 mg/150 mL  Status:  Discontinued        750 mg 150 mL/hr over 60 Minutes Intravenous Every 24 hours 05/19/22 2158 05/20/22 1017   05/20/22 2000  vancomycin (VANCOCIN) IVPB 1000 mg/200 mL premix  Status:  Discontinued        1,000 mg 200 mL/hr over 60 Minutes Intravenous Every 24 hours 05/19/22 2107 05/19/22 2158   05/20/22 1115  cefTRIAXone (ROCEPHIN) 2 g in sodium chloride 0.9 % 100 mL IVPB        2 g 200 mL/hr over 30 Minutes Intravenous Every 24 hours 05/20/22 1017 05/25/22 1042   05/20/22 1000  ceFEPIme (MAXIPIME) 2 g in sodium chloride 0.9 % 100 mL  IVPB  Status:  Discontinued        2 g 200 mL/hr over 30 Minutes Intravenous Every 12 hours 05/19/22 2107 05/20/22 1017   05/19/22 2200  vancomycin (VANCOREADY) IVPB 1250 mg/250 mL        1,250 mg 166.7 mL/hr over 90 Minutes Intravenous  Once 05/19/22 2158 05/20/22 0116   05/19/22 2100  ceFEPIme (MAXIPIME) 2 g in sodium chloride 0.9 % 100 mL IVPB        2 g 200 mL/hr over 30 Minutes Intravenous  Once 05/19/22 2048 05/19/22 2208   05/19/22 2100  vancomycin (VANCOREADY) IVPB 1500 mg/300 mL  Status:  Discontinued        1,500 mg 150 mL/hr over 120 Minutes Intravenous  Once 05/19/22 2048 05/19/22 2158       Subjective: Seen and examined at bedside and had just desaturated and was very anxious this morning.  Was very fidgety as well.  Denies any chest pain or shortness of breath.  Appears a little bit restless  Objective: Vitals:   06/04/22 1238 06/04/22 1306 06/04/22 1443 06/04/22 1601  BP:    124/84  Pulse: 100 100 85 94  Resp: 14  (!) 29 18  Temp:    98.2 F (36.8 C)  TempSrc:    Oral  SpO2: 90%  99% 92%  Weight:      Height:        Intake/Output Summary (Last 24 hours) at 06/04/2022 1907 Last data filed at 06/03/2022 2329 Gross per 24 hour  Intake --  Output 0 ml  Net 0 ml   Filed Weights   05/31/22 0600 06/02/22 0335 06/03/22 0500  Weight: 57.2 kg 55.2 kg 56.3 kg   Examination: Physical Exam:  Constitutional: Thin Caucasian female who is anxious and little agitated Respiratory: Diminished to auscultation bilaterally with coarse breath sounds, no wheezing, rales, rhonchi or crackles. Normal respiratory effort and patient is not tachypenic.  Wearing trach collar and tolerating ATC Cardiovascular: RRR, no murmurs / rubs / gallops. S1 and S2 auscultated. No extremity edema. Abdomen: Soft, non-tender, non-distended. Bowel sounds positive.  GU: Deferred. Musculoskeletal: No clubbing / cyanosis of digits/nails. No joint deformity upper and lower extremities.  Skin: No  rashes, lesions, ulcers on limited skin evaluation. No induration; Warm and dry.  Neurologic: CN 2-12 grossly intact with no focal deficits. Romberg sign and cerebellar reflexes not assessed.  Psychiatric: Normal judgment and insight. Alert and oriented x 3.  Anxious mood and appropriate affect.   Data Reviewed: I have personally reviewed following labs and imaging studies  CBC: Recent Labs  Lab 05/30/22 0520 05/31/22 0538 06/01/22 0215 06/02/22 0343 06/02/22 1316 06/03/22 0439 06/03/22 1139 06/04/22 1109  WBC 26.3* 26.0*  --  19.3*  --  9.2  --  10.9*  NEUTROABS  --   --   --  17.0*  --   --   --  7.9*  HGB 8.1* 7.6*   < > 7.1* 7.1* 7.3* 7.8* 7.9*  HCT 24.5* 23.1*   < > 22.2* 21.0* 23.5* 23.0* 24.4*  MCV 94.2 96.3  --  99.1  --  100.9*  --  96.4  PLT 414* 433*  --  413*  --  451*  --  546*   < > = values in this interval not displayed.   Basic Metabolic Panel: Recent Labs  Lab 05/29/22 0505 05/30/22 0520 05/31/22 0538 06/01/22 0215 06/02/22 0343 06/02/22 1316 06/03/22 0439 06/03/22 1139 06/04/22 0857  NA 142 145 148*   < > 151* 151* 148* 147* 144  K 3.6 3.0* 3.7   < > 3.5 3.7 3.8 3.8 3.7  CL 102 103 107  --  114*  --  112*  --  108  CO2 28 27 28   --  28  --  27  --  25  GLUCOSE 92 121* 120*  --  115*  --  94  --  101*  BUN 76* 79* 75*  --  69*  --  61*  --  62*  CREATININE 1.93* 1.90* 1.77*  --  1.47*  --  1.17*  --  1.05*  CALCIUM 9.7 9.9 9.8  --  9.8  --  9.5  --  9.8  MG 1.8 1.8 1.9  --  2.2  --   --   --  1.9  PHOS  --   --   --   --  4.1  --   --   --  3.6   < > = values in this interval not displayed.   GFR: Estimated Creatinine Clearance: 56.3 mL/min (A) (by C-G formula based on SCr of 1.05 mg/dL (H)). Liver Function Tests: Recent Labs  Lab 06/02/22 0343 06/04/22 0857  AST 21 26  ALT 23 25  ALKPHOS 61 63  BILITOT 0.5 0.4  PROT 5.9* 6.1*  ALBUMIN 1.9* 2.2*   No results for input(s): "LIPASE", "AMYLASE" in the last 168 hours. No results for  input(s): "AMMONIA" in the last 168 hours. Coagulation Profile: No results for input(s): "INR", "PROTIME" in the last 168 hours. Cardiac Enzymes: No results for input(s): "CKTOTAL", "CKMB", "CKMBINDEX", "TROPONINI" in the last 168 hours. BNP (last 3 results) No results for input(s): "PROBNP" in the last 8760 hours. HbA1C: No results for input(s): "HGBA1C" in the last 72 hours. CBG: Recent Labs  Lab 06/04/22 0020 06/04/22 0446 06/04/22 1002 06/04/22 1245 06/04/22 1605  GLUCAP 102* 97 92 83 87   Lipid Profile: Recent Labs    06/02/22 0343  TRIG 162*   Thyroid Function Tests: No results for input(s): "TSH", "T4TOTAL", "FREET4", "T3FREE", "THYROIDAB" in the last 72 hours. Anemia Panel: No results for input(s): "VITAMINB12", "FOLATE", "FERRITIN", "TIBC", "IRON", "RETICCTPCT" in the last 72 hours. Sepsis Labs: Recent Labs  Lab 05/30/22 0520  PROCALCITON 0.76    Recent Results (from the past 240 hour(s))  MRSA Next Gen by PCR, Nasal     Status: None   Collection Time: 05/27/22 10:43 AM   Specimen: Nasal Mucosa; Nasal Swab  Result Value Ref Range Status   MRSA by PCR Next Gen NOT DETECTED NOT DETECTED Final    Comment: (NOTE) The GeneXpert MRSA Assay (FDA approved for NASAL specimens only), is one component of  a comprehensive MRSA colonization surveillance program. It is not intended to diagnose MRSA infection nor to guide or monitor treatment for MRSA infections. Test performance is not FDA approved in patients less than 51 years old. Performed at [facility], [address] [place], [place] [address]   Culture, Respiratory w Gram Stain     Status: None   Collection Time: 05/30/22  9:23 AM   Specimen: Tracheal Aspirate; Respiratory  Result Value Ref Range Status   Specimen Description TRACHEAL ASPIRATE  Final   Special Requests NONE  Final   Gram Stain   Final    MODERATE WBC PRESENT,BOTH PMN AND MONONUCLEAR FEW GRAM NEGATIVE RODS FEW SQUAMOUS EPITHELIAL  CELLS PRESENT Performed at [facility], [address] [place], [place] [address]    Culture   Final    FEW ENTEROBACTER CLOACAE FEW STENOTROPHOMONAS MALTOPHILIA    Report Status 06/02/2022 FINAL  Final   Organism ID, Bacteria ENTEROBACTER CLOACAE  Final   Organism ID, Bacteria STENOTROPHOMONAS MALTOPHILIA  Final      Susceptibility   Enterobacter cloacae - MIC*    CEFAZOLIN >=64 RESISTANT Resistant     CEFEPIME 4 INTERMEDIATE Intermediate     CEFTAZIDIME >=64 RESISTANT Resistant     CIPROFLOXACIN <=0.25 SENSITIVE Sensitive     GENTAMICIN <=1 SENSITIVE Sensitive     IMIPENEM <=0.25 SENSITIVE Sensitive     TRIMETH/SULFA <=20 SENSITIVE Sensitive     PIP/TAZO >=128 RESISTANT Resistant     * FEW ENTEROBACTER CLOACAE   Stenotrophomonas maltophilia - MIC*    LEVOFLOXACIN 0.25 SENSITIVE Sensitive     TRIMETH/SULFA <=20 SENSITIVE Sensitive     * FEW STENOTROPHOMONAS MALTOPHILIA     Radiology Studies: DG CHEST PORT 1 VIEW  Result Date: 06/04/2022 CLINICAL DATA:  Short of breath. EXAM: PORTABLE CHEST 1 VIEW COMPARISON:  Chest 06/01/2022 FINDINGS: Tracheostomy remains in good position. Feeding tube enters the stomach with the tip not visualized Bibasilar airspace disease. Progressive consolidation right lung base. Progressive consolidation left lower lobe with overall decrease size of infiltrate. Small bilateral pleural effusions. IMPRESSION: Progression of right lower lobe airspace disease Progressive consolidation left lower lobe with overall decreased size of infiltrate. Electronically Signed   By: Marlan Palauharles  Clark M.D.   On: 06/04/2022 10:22     Scheduled Meds:  acetaminophen (TYLENOL) oral liquid 160 mg/5 mL  650 mg Per Tube QID   arformoterol  15 mcg Nebulization BID   ascorbic acid  500 mg Per Tube BID   Chlorhexidine Gluconate Cloth  6 each Topical Daily   clonazePAM  2 mg Per Tube QHS   famotidine  20 mg Per Tube BID   feeding supplement (VITAL AF 1.2 CAL)  1,000 mL Per  Tube Q24H   fiber supplement (BANATROL TF)  60 mL Per Tube BID   folic acid  1 mg Per Tube Daily   free water  200 mL Per Tube Q2H   gabapentin  300 mg Per Tube BID   heparin  5,000 Units Subcutaneous Q8H   insulin aspart  0-9 Units Subcutaneous Q4H   leptospermum manuka honey  1 Application Topical Daily   levalbuterol  0.63 mg Nebulization Q6H   metoprolol tartrate  25 mg Per Tube Q8H   nutrition supplement (JUVEN)  1 packet Per Tube BID BM   mouth rinse  15 mL Mouth Rinse 4 times per day   polyethylene glycol  17 g Per Tube Daily   QUEtiapine  100  mg Per Tube QHS   QUEtiapine  50 mg Per Tube Daily   revefenacin  175 mcg Nebulization Daily   sodium chloride HYPERTONIC  4 mL Nebulization Q6H   sulfamethoxazole-trimethoprim  2 tablet Oral Q12H   thiamine  100 mg Per Tube Daily   valproic acid  250 mg Per Tube BID   vitamin A  10,000 Units Oral Daily   Continuous Infusions:  sodium chloride     sodium chloride Stopped (05/27/22 1640)     LOS: 16 days   Raiford Noble, DO [facility] Available via Epic secure chat 7am-7pm After these hours, please refer to coverage provider listed on [email or web address] 06/04/2022, 7:07 PM

## 2022-06-04 NOTE — Evaluation (Signed)
Clinical/Bedside Swallow Evaluation Patient Details  Name: Monica Hopkins MRN: [id number] Date of Birth: 02-17-1971  Today's Date: 06/04/2022 Time: SLP Start Time (ACUTE ONLY): 1038 SLP Stop Time (ACUTE ONLY): 1047 SLP Time Calculation (min) (ACUTE ONLY): 9 min  Past Medical History:  Past Medical History:  Diagnosis Date   Prolapse, disk    Past Surgical History:  Past Surgical History:  Procedure Laterality Date   NECK SURGERY     HPI:  Pt is a 51 y.o. female admitted 05/19/22 after cardiac arrest at home, ROSC 26 min, hypothermic. ETT 10/17-10/23; reintubated 10/23-trach10/25. Pt with new HFpEF. MRI brain negative for acute changes. CXR 10/30: Stable multifocal pulmonary infiltrates and small left pleural effusion. PMH: bipolar disorder.    Assessment / Plan / Recommendation  Clinical Impression  Pt was seen for bedside swallow evaluation which was limited due to pt's participation. Pt denied a history of dysphagia. Oral mechanism exam was [facility] and pt presented with adequate, natural dentition in poor condition. Pt exhibited a wet vocal quality at baseline which worsened with trials of ice chips and did not improve with prompted coughing. Pt stated that she was eager to have something to drink; however, she refused all p.o. trials after ice chips stating, "It's too much.Marland KitchenMarland KitchenI can't do all of this now.Marland KitchenMarland KitchenI'm scared that it'll make me code again...maybe tomorrow". Pt's level of agitation increased with SLP's encouragement. It is recommended that the pt's NPO status be maintained, but limited ice chips may be provided after oral care. SLP will follow and assess pt's readiness for instrumental assessment. SLP Visit Diagnosis: Dysphagia, unspecified (R13.10)    Aspiration Risk  Moderate aspiration risk    Diet Recommendation NPO;Ice chips PRN after oral care;Alternative means - temporary   Medication Administration: Via alternative means Postural Changes: Seated upright at 90 degrees     Other  Recommendations Oral Care Recommendations: Oral care prior to ice chip/H20    Recommendations for follow up therapy are one component of a multi-disciplinary discharge planning process, led by the attending physician.  Recommendations may be updated based on patient status, additional functional criteria and insurance authorization.  Follow up Recommendations Skilled nursing-short term rehab (<3 hours/day)      Assistance Recommended at Discharge Frequent or constant Supervision/Assistance  Functional Status Assessment Patient has had a recent decline in their functional status and demonstrates the ability to make significant improvements in function in a reasonable and predictable amount of time.  Frequency and Duration min 2x/week  2 weeks       Prognosis Prognosis for Safe Diet Advancement: Good Barriers to Reach Goals: Cognitive deficits;Severity of deficits;Motivation Barriers/Prognosis Comment: cooperation      Swallow Study   General Date of Onset: 05/20/22 HPI: Pt is a 51 y.o. female admitted 05/19/22 after cardiac arrest at home, ROSC 26 min, hypothermic. ETT 10/17-10/23; reintubated 10/23-trach10/25. Pt with new HFpEF. MRI brain negative for acute changes. CXR 10/30: Stable multifocal pulmonary infiltrates and small left pleural effusion. PMH: bipolar disorder. Type of Study: Bedside Swallow Evaluation Previous Swallow Assessment: none Diet Prior to this Study: NPO;NG Tube Temperature Spikes Noted: No Respiratory Status: Trach;Trach Collar Trach Size and Type: #6;Cuff;With PMSV in place;Deflated History of Recent Intubation: Yes Length of Intubations (days): 8 days Date extubated:  (trach 10/25) Behavior/Cognition: Alert;Agitated;Confused;Requires cueing Oral Cavity Assessment: Within Functional Limits Oral Care Completed by SLP: No Oral Cavity - Dentition: Adequate natural dentition;Poor condition Vision: Functional for self-feeding Self-Feeding Abilities:  Needs assist Patient Positioning: Upright in bed;Postural control  adequate for testing Baseline Vocal Quality: Wet Volitional Cough: Weak;Congested Volitional Swallow: Able to elicit    Oral/Motor/Sensory Function Overall Oral Motor/Sensory Function: Within functional limits   Ice Chips Ice chips: Impaired Presentation: Spoon Pharyngeal Phase Impairments: Wet Vocal Quality   Thin Liquid Thin Liquid: Not tested (pt refused)    Nectar Thick Nectar Thick Liquid: Not tested   Honey Thick Honey Thick Liquid: Not tested   Puree Puree: Not tested (pt refused)   Solid     Solid: Not tested     Sandon Yoho I. Hardin Negus, [place], [place] Office number [phone]  Horton Marshall 06/04/2022,11:28 AM

## 2022-06-04 NOTE — Progress Notes (Signed)
NAME:  Monica Hopkins, MRN:  [id number], DOB:  [birth year], LOS: 16 ADMISSION DATE:  05/19/2022, CONSULTATION DATE:  05/19/22 REFERRING MD:  Dalene Seltzer, CHIEF COMPLAINT:  found down, cardiac arrest    History of Present Illness:  51 yo woman with hx of bipolar disorder,  here after cardiac arrest.   Per family has been feeling sick and taking trazodone and benadryl at night. They thought she had covid a couple of weeks ago, then recovered somewhat.  For past two weeks has been sob intermittently with some minimal cough, fevers, sore throat.  Her husband came home and she was alert and talking, he went to the bathroom.  10- min later he came back to find her unconscious.  He started CPR immediately, EMS came fairly quickly.  She was reportedly found to be in PEA.  Given 1 mg epi, also lidocaine.  Found once to be in shockable rhythm, shocked once.  Rosc after about 26 min, while in route.   Per family has undergone large weight loss  a few years ago, unclear cause.  Patient had felt it was from lithium but unclear when last takng this..  Poor po intake from low appeitite, nausea and vomiting.  She apparently has not wanted to go to visit the dr.  She recently has only done phone visits with psychiatry.   Sleeps sitting up in recliner.   Per her mother, she has not felt well in years, has complained of some virus or other acute illness affecting her for the past year.   Intubated in ED ( Had king airway in place) at 820 per drug record.  In ED hypothermic  ABG 7.206/49/82 Cefepime and vanc ordered.   AKI cr 1.2 from 0.79 WBC 14.3   Home meds klonipin, trazodone depakote.   Pertinent  Medical History  Bipolar disorder Unexplained weight loss  Poor dentition.  Significant Hospital Events: Including procedures, antibiotic start and stop dates in addition to other pertinent events   10/17: admitted post cardiac arrest; ROSC 26 minutes 10/20: off pressors, will awaken agitated,  desaturates SBT.  10/23 failed extubation trial 10/25 trach  Interim History / Subjective:  This am PCCM called for desaturation despite tracheal suction Sats 100% currently on 60% Trach collar RR 22 No accessory muscle use Patient denies sob Says she does feel anxious and wants ice chips  Objective   Blood pressure (!) 151/75, pulse 95, temperature 98.1 F (36.7 C), temperature source Oral, resp. rate 12, height 5\' 6"  (1.676 m), weight 56.3 kg, SpO2 99 %.    FiO2 (%):  [80 %] 80 %   Intake/Output Summary (Last 24 hours) at 06/04/2022 1000 Last data filed at 06/03/2022 2329 Gross per 24 hour  Intake 470 ml  Output 0 ml  Net 470 ml    Filed Weights   05/31/22 0600 06/02/22 0335 06/03/22 0500  Weight: 57.2 kg 55.2 kg 56.3 kg    Examination: General:  ill and anxious appearing female in NAD HEENT: MM pink/moist; trach and cor trak in place Neuro: Aox3; MAE CV: s1s2, RRR, no m/r/g PULM:  dim rhonchi BS bilaterally; trach collar 60% sats 100% GI: soft, bsx4 active  Extremities: warm/dry, no edema  Skin: no rashes or lesions appreciated  Assessment & Plan:  Acute hypoxic respiratory failure due to Strep pneumonia pneumonia, complicated with aspiration and healthcare associated pneumonia with Enterobacter, Klebsiella and stenotrophomonas now s/p trach Patient tolerated trach collar for 24 hours, intermittently desats due to agitation and restlessness  but oxygen saturation improves when she is calm and relaxed P: -continue to wean fio2 on trach collar for sats >92% -TF held this am due to desaturation episode; resume later today as long as sats stable -abx changed to bactrim per primary -CXR performed with no acute abnormality -pulm toiletry: CPT tid -continue guaifenesin -adding hypertonic neb q6 w/ xopenex -continue yupelri, brovana -trach care per protocol -tracheal suction as needed; lavage as needed -spoke with primary and recommended increasing anxiety med  regimen -PT/OT/SLP  S/p cardiac arrest: found to be in PEA/shockable rhythm, in the setting of hypoxia due to pneumococcal pneumonia  Acute septic/toxic encephalopathy-improved New diagnosis of acute HFpEF  Severe protein calorie malnutrition Acute rhabdomyolysis, resolved Hyperkalemia/Hypokalemia, resolved AKI due to septic ATN, rhabdomyolysis Hypernatremia Anxiety depression bipolar PTA Rib fx from CPR Acute blood loss anemia on anemia of chronic disease P: -per primary  Best Practice (right click and "Reselect all SmartList Selections" daily)   Per primary   JD Rexene Agent [facility] 06/04/2022, 10:09 AM  Please see [email or web address] for pager details.  From 7A-7P if no response, please call [phone]. After hours, please call ELink [phone].

## 2022-06-04 NOTE — Progress Notes (Signed)
Speech Language Pathology Treatment: [name] Speaking valve  Patient Details Name: Monica Hopkins MRN: [id number] DOB: 1971-03-15 Today's Date: 06/04/2022 Time: 9518-8416 SLP Time Calculation (min) (ACUTE ONLY): 18 min  Assessment / Plan / Recommendation Clinical Impression  Pt was alert throughout the session and her daughter was present. Pt was educated regarding the anatomy of the larynx, the impact of the trach on voicing, and the goals of the session. Pt verbalized understanding. Cuff was deflated upon SLP's arrival. Per EMR, SpO2 goal is >85%. Pt presented with vitals of RR 34, SpO2 87, and HR 116 at baseline. She tolerated PMSV for 15  minutes with vitals WFL ranging RR 24-30, SpO2 83-93, and HR 115-116. Attempts were made, but pt's cough was weak and she was unable to expectorate secretions orally following PMSV placement. Vocal quality was notably improved following PMSV placement and intelligibility was adequate. Pt was very impressed with her improved vocal quality and commented on how much she enjoys it. PMSV was removed at the end of session and cuff was left deflated. PMSV may be used intermittently to facilitate communication if full staff supervision is provided. SLP will continue to follow pt.   HPI HPI: Pt is a 51 y.o. female admitted 05/19/22 after cardiac arrest at home, ROSC 26 min, hypothermic. ETT 10/17-10/23; reintubated 10/23-trach10/25. Pt with new HFpEF. MRI brain negative for acute changes. CXR 10/30: Stable multifocal pulmonary infiltrates and small left pleural effusion. PMH: bipolar disorder.      SLP Plan  Continue with current plan of care      Recommendations for follow up therapy are one component of a multi-disciplinary discharge planning process, led by the attending physician.  Recommendations may be updated based on patient status, additional functional criteria and insurance authorization.    Recommendations         Patient may use Passy-Muir Speech  Valve: Intermittently with supervision PMSV Supervision: Full         Follow Up Recommendations: Skilled nursing-short term rehab (<3 hours/day) Assistance recommended at discharge: Frequent or constant Supervision/Assistance SLP Visit Diagnosis: [name] (R49.1) Plan: Continue with current plan of care          Monica Hopkins, [place], [place] Office number [phone]  Horton Marshall  06/04/2022, 11:14 AM

## 2022-06-04 NOTE — Progress Notes (Signed)
Meropenem 1 gm requested twice via epic. Pharmacy contacted via phone and notified of need.

## 2022-06-04 NOTE — Progress Notes (Signed)
Patient is anxious and only tolerated Metaneb for 1/2 of the treatment time.

## 2022-06-05 ENCOUNTER — Inpatient Hospital Stay (HOSPITAL_COMMUNITY): Payer: Medicaid Other

## 2022-06-05 DIAGNOSIS — Z515 Encounter for palliative care: Secondary | ICD-10-CM

## 2022-06-05 DIAGNOSIS — J9601 Acute respiratory failure with hypoxia: Secondary | ICD-10-CM

## 2022-06-05 DIAGNOSIS — E43 Unspecified severe protein-calorie malnutrition: Secondary | ICD-10-CM

## 2022-06-05 DIAGNOSIS — J189 Pneumonia, unspecified organism: Secondary | ICD-10-CM

## 2022-06-05 DIAGNOSIS — Z7189 Other specified counseling: Secondary | ICD-10-CM

## 2022-06-05 LAB — GLUCOSE, CAPILLARY
Glucose-Capillary: 111 mg/dL — ABNORMAL HIGH (ref 70–99)
Glucose-Capillary: 90 mg/dL (ref 70–99)
Glucose-Capillary: 75 mg/dL (ref 70–99)
Glucose-Capillary: 99 mg/dL (ref 70–99)
Glucose-Capillary: 87 mg/dL (ref 70–99)
Glucose-Capillary: 107 mg/dL — ABNORMAL HIGH (ref 70–99)

## 2022-06-05 LAB — PHOSPHORUS: Phosphorus: 3.9 mg/dL (ref 2.5–4.6)

## 2022-06-05 LAB — COMPREHENSIVE METABOLIC PANEL
ALT: 21 U/L (ref 0–44)
AST: 20 U/L (ref 15–41)
Albumin: 1.8 g/dL — ABNORMAL LOW (ref 3.5–5.0)
Alkaline Phosphatase: 57 U/L (ref 38–126)
Anion gap: 9 (ref 5–15)
BUN: 69 mg/dL — ABNORMAL HIGH (ref 6–20)
CO2: 25 mmol/L (ref 22–32)
Calcium: 9 mg/dL (ref 8.9–10.3)
Chloride: 107 mmol/L (ref 98–111)
Creatinine, Ser: 1.15 mg/dL — ABNORMAL HIGH (ref 0.44–1.00)
GFR, Estimated: 58 mL/min — ABNORMAL LOW (ref >60)
Glucose, Bld: 107 mg/dL — ABNORMAL HIGH (ref 70–99)
Potassium: 3.5 mmol/L (ref 3.5–5.1)
Sodium: 141 mmol/L (ref 135–145)
Total Bilirubin: 0.4 mg/dL (ref 0.3–1.2)
Total Protein: 4.8 g/dL — ABNORMAL LOW (ref 6.5–8.1)

## 2022-06-05 LAB — CBC WITH DIFFERENTIAL/PLATELET
Abs Immature Granulocytes: 0.08 K/uL — ABNORMAL HIGH (ref 0.00–0.07)
Basophils Absolute: 0.1 K/uL (ref 0.0–0.1)
Basophils Relative: 1 %
Eosinophils Absolute: 0.3 K/uL (ref 0.0–0.5)
Eosinophils Relative: 3 %
HCT: 22.2 % — ABNORMAL LOW (ref 36.0–46.0)
Hemoglobin: 7.1 g/dL — ABNORMAL LOW (ref 12.0–15.0)
Immature Granulocytes: 1 %
Lymphocytes Relative: 24 %
Lymphs Abs: 2.1 K/uL (ref 0.7–4.0)
MCH: 31.4 pg (ref 26.0–34.0)
MCHC: 32 g/dL (ref 30.0–36.0)
MCV: 98.2 fL (ref 80.0–100.0)
Monocytes Absolute: 0.5 K/uL (ref 0.1–1.0)
Monocytes Relative: 6 %
Neutro Abs: 5.8 K/uL (ref 1.7–7.7)
Neutrophils Relative %: 65 %
Platelets: 467 K/uL — ABNORMAL HIGH (ref 150–400)
RBC: 2.26 MIL/uL — ABNORMAL LOW (ref 3.87–5.11)
RDW: 15.8 % — ABNORMAL HIGH (ref 11.5–15.5)
WBC: 8.8 K/uL (ref 4.0–10.5)
nRBC: 0 % (ref 0.0–0.2)

## 2022-06-05 LAB — IRON AND TIBC
Iron: 61 ug/dL (ref 28–170)
Saturation Ratios: 40 % — ABNORMAL HIGH (ref 10.4–31.8)
TIBC: 154 ug/dL — ABNORMAL LOW (ref 250–450)
UIBC: 93 ug/dL

## 2022-06-05 LAB — FOLATE: Folate: 17.3 ng/mL (ref >5.9)

## 2022-06-05 LAB — RETICULOCYTES
Immature Retic Fract: 13.6 % (ref 2.3–15.9)
RBC.: 2.15 MIL/uL — ABNORMAL LOW (ref 3.87–5.11)
Retic Count, Absolute: 51.6 K/uL (ref 19.0–186.0)
Retic Ct Pct: 2.4 % (ref 0.4–3.1)

## 2022-06-05 LAB — FERRITIN: Ferritin: 415 ng/mL — ABNORMAL HIGH (ref 11–307)

## 2022-06-05 LAB — MAGNESIUM: Magnesium: 1.8 mg/dL (ref 1.7–2.4)

## 2022-06-05 LAB — VITAMIN B12: Vitamin B-12: 1088 pg/mL — ABNORMAL HIGH (ref 180–914)

## 2022-06-05 MED ORDER — PROCHLORPERAZINE EDISYLATE 10 MG/2ML IJ SOLN
10.0000 mg | Freq: Four times a day (QID) | INTRAMUSCULAR | Status: DC | PRN
  Administered 2022-06-05: 10 mg via INTRAVENOUS
  Filled 2022-06-05: qty 2

## 2022-06-05 MED ORDER — CLONAZEPAM 0.5 MG PO TABS
0.5000 mg | ORAL_TABLET | Freq: Two times a day (BID) | ORAL | Status: DC | PRN
  Administered 2022-06-05 – 2022-06-07 (×6): 0.5 mg via ORAL
  Filled 2022-06-05 (×7): qty 1

## 2022-06-05 MED ORDER — HYDROMORPHONE HCL 1 MG/ML IJ SOLN
0.5000 mg | INTRAMUSCULAR | Status: DC | PRN
  Administered 2022-06-05 – 2022-06-10 (×24): 0.5 mg via INTRAVENOUS
  Filled 2022-06-05 (×27): qty 0.5

## 2022-06-05 NOTE — Progress Notes (Signed)
Physical Therapy Treatment Patient Details Name: Monica Hopkins MRN: [id number] DOB: 1970-12-17 Today's Date: 06/05/2022   History of Present Illness Pt is a 51 y.o. female admitted 05/19/22 with cardiac arrest at home, ROSC 26 min, hypothermic. Intubated 10/17, failed extubation 10/23; trach placed 10/25. Pt with new HFpEF. PMH includes bipolar disorder.    PT Comments    Pt is making good, gradual progress with mobility. She was able to progress to transitioning supine > sit EOB and transferring to stand with a RW with modA x1. She continues to demonstrate deficits in gross overall strength and core control, impacting her balance and ease with mobility. She does fatigue fairly quickly still, but is very motivated to improve and make progress. As pt was independent PTA, she may become a good candidate for AIR pending her continued progress with activity tolerance. Will continue to recommend rehab at a SNF at this time though. Will continue to follow acutely.    Recommendations for follow up therapy are one component of a multi-disciplinary discharge planning process, led by the attending physician.  Recommendations may be updated based on patient status, additional functional criteria and insurance authorization.  Follow Up Recommendations  Skilled nursing-short term rehab (<3 hours/day) (potentially AIR if tolerance improves) Can patient physically be transported by private vehicle: No   Assistance Recommended at Discharge Frequent or constant Supervision/Assistance  Patient can return home with the following Two people to help with walking and/or transfers;Two people to help with bathing/dressing/bathroom;Direct supervision/assist for medications management;Help with stairs or ramp for entrance;Assist for transportation;Assistance with cooking/housework;Direct supervision/assist for financial management;Assistance with feeding   Equipment Recommendations  Wheelchair (measurements  PT);Hospital bed;Wheelchair cushion (measurements PT)    Recommendations for Other Services Rehab consult     Precautions / Restrictions Precautions Precautions: Fall;Other (comment) Precaution Comments: cortrak, trach collar, multiple wounds along spine and sacrum Restrictions Weight Bearing Restrictions: No     Mobility  Bed Mobility Overal bed mobility: Needs Assistance Bed Mobility: Supine to Sit, Sit to Supine     Supine to sit: Mod assist, HOB elevated Sit to supine: Min assist, HOB elevated   General bed mobility comments: ModA to ascend trunk with pt pulling up on therapist's hand and to scoot hips to EOB using bed pad. MinA to lift legs onto bed. MaxA to scoot superiorly in bed using bed pad, cuing pt to push through feet to bridge up in bed.    Transfers Overall transfer level: Needs assistance Equipment used: Rolling walker (2 wheels) Transfers: Sit to/from Stand Sit to Stand: Mod assist           General transfer comment: ModA under buttocks to assist pt in powering up to stand and to extend her hips, unsteadiness noted but able to recover with assistance. Pt declined attempts to progress further to step/pivot to chair due to pain and fatigue.    Ambulation/Gait               General Gait Details: unable   Stairs             Wheelchair Mobility    Modified Rankin (Stroke Patients Only)       Balance Overall balance assessment: Needs assistance Sitting-balance support: Bilateral upper extremity supported, Feet supported, Single extremity supported, No upper extremity supported Sitting balance-Leahy Scale: Fair Sitting balance - Comments: Able to sit statically without UE support with supervision, but has LOB when trying to reach across body min off BOS, requiring UE support to catch self  or minA. Facilitated core control through having pt reach repeatedly across body, cuing pt to slow to improve control.   Standing balance support:  Bilateral upper extremity supported Standing balance-Leahy Scale: Poor Standing balance comment: Reliant on RW and modA to stand statically ~30 sec                            Cognition Arousal/Alertness: Awake/alert Behavior During Therapy: Anxious (intermittent) Overall Cognitive Status: Impaired/Different from baseline Area of Impairment: Attention, Memory, Awareness, Problem solving, Following commands, Safety/judgement                   Current Attention Level: Selective Memory: Decreased short-term memory Following Commands: Follows one step commands with increased time, Follows one step commands consistently Safety/Judgement: Decreased awareness of deficits, Decreased awareness of safety Awareness: Emergent Problem Solving: Slow processing, Difficulty sequencing, Requires verbal cues General Comments: Pt anxious in regards to mobility progression, but easy to calm pt. Very motivated to improve. Slow processing, needing cues to sequence tasks as pt would ask "how?".        Exercises General Exercises - Lower Extremity Long Arc Quad: AROM, Both, 5 reps, Seated Other Exercises Other Exercises: core strengthening/control through having pt reach off BOS min and across body to touch targets    General Comments        Pertinent Vitals/Pain Pain Assessment Pain Assessment: Faces Faces Pain Scale: Hurts little more Pain Location: migraine, back, neck, wounds Pain Descriptors / Indicators: Discomfort, Grimacing Pain Intervention(s): Limited activity within patient's tolerance, Monitored during session, Repositioned, Other (comment) (requested for RN to get an air mattress for pt)    Home Living                          Prior Function            PT Goals (current goals can now be found in the care plan section) Acute Rehab PT Goals Patient Stated Goal: to get an air mattress PT Goal Formulation: With patient Time For Goal Achievement:  06/12/22 Potential to Achieve Goals: Fair Progress towards PT goals: Progressing toward goals    Frequency    Min 3X/week      PT Plan Current plan remains appropriate    Co-evaluation              AM-PAC PT "6 Clicks" Mobility   Outcome Measure  Help needed turning from your back to your side while in a flat bed without using bedrails?: A Lot Help needed moving from lying on your back to sitting on the side of a flat bed without using bedrails?: A Lot Help needed moving to and from a bed to a chair (including a wheelchair)?: Total Help needed standing up from a chair using your arms (e.g., wheelchair or bedside chair)?: A Lot Help needed to walk in hospital room?: Total Help needed climbing 3-5 steps with a railing? : Total 6 Click Score: 9    End of Session Equipment Utilized During Treatment: Oxygen Activity Tolerance: Patient tolerated treatment well Patient left: in bed;with call bell/phone within reach;with bed alarm set Nurse Communication: Mobility status;Other (comment) (request for air mattress) PT Visit Diagnosis: Other abnormalities of gait and mobility (R26.89);Muscle weakness (generalized) (M62.81);Unsteadiness on feet (R26.81);Other symptoms and signs involving the nervous system (R29.898);Difficulty in walking, not elsewhere classified (R26.2)     Time: 1610-9604 PT Time Calculation (min) (ACUTE ONLY):  25 min  Charges:  $Therapeutic Activity: 23-37 mins                     Raymond Gurney, PT, DPT Acute Rehabilitation Services  Office: [phone]    Jewel Baize 06/05/2022, 5:17 PM

## 2022-06-05 NOTE — Progress Notes (Signed)
   06/05/22 2000  Clinical Encounter Type  Visited With Patient and family together  Visit Type Follow-up;Spiritual support   Chaplain responded to a spiritual consult for prayer. Alicja's son was with her this evening so I know she was pleased and happy to have his company. I prayed with them for a restful night and a fresh new day.   Danice Goltz [facility]  [phone]

## 2022-06-05 NOTE — Progress Notes (Signed)
Patient refusing metaneb today.

## 2022-06-05 NOTE — Progress Notes (Signed)
Patient is off the floor. Will check on patient when she returns

## 2022-06-05 NOTE — Progress Notes (Signed)
Patient and patients son state they have not seen patients PMV since shift change.  RT looked for PMV in bed and in PMV case.  PMV not located.  RT will let RN know.

## 2022-06-05 NOTE — Progress Notes (Signed)
PROGRESS NOTE    Monica Hopkins  OIZ:[id number] DOB: [birth year] DOA: 05/19/2022 PCP: Kaleen Mask, MD   Brief Narrative:  Patient is a 51 year old Caucasian female with a past medical history significant for but limited to bipolar disorder who after cardiac arrest.  Per her family she had been feeling sick and taking trazodone and Benadryl at night.  They thought she had COVID a couple weeks ago and then recovered somewhat.  The last few weeks she has been short of breath intermittently with a mild cough fevers and sore throat.  Her husband came home she is alert and talking and then he went about a minute about 10 minutes later he got back to find her unconscious.  He started CPR immediately and EMS came fairly quickly.  She is reportedly found in PEA arrest and was given 1 mg of epi as well as lidocaine.  Then subsequently she is found wants to be in a shockable rhythm and was shocked and received ROSC after about 26 minutes while in route.  Per the family she is gone under a large weight loss a few years ago with unclear cause and has felt it was from the lithium but is unclear when she last taken this.  She has had poor p.o. intake from low appetite nausea vomiting and she apparently not wanted to go to the physician and had only recently done a outpatient follow-up with psychiatry.  Normally she sleeps in recliner.  In the ED when she was born and she is intubated and was hypothermic.  ABG was 7.206 with a PCO2 49 and O2 level of 82.  She was given IV vancomycin and cefepime and was noted to have AKI.  Home medications include Klonopin and trazodone.  Currently she was admitted and significant hospital events included   Significant Hospital Events: Including procedures, antibiotic start and stop dates in addition to other pertinent events   10/17: admitted post cardiac arrest; ROSC 26 minutes 10/20: off pressors, will awaken agitated, desaturates SBT.  10/23 failed extubation trial 10/25  trach 06/04/2022 she was transferred to the [facility] service 06/04/22 she desaturated on trach collar and PCCM was reconsulted. They 2023.  Continues to fail at swallowing and will need alternate means of feeding and she is resistant to PEG tube.  Consulted palliative care for further goals of care discussion and will also need a psychiatric evaluation   Assessment and Plan: S/p cardiac arrest: found to be in PEA/shockable rhythm, in the setting of hypoxia due to pneumococcal pneumonia  Acute hypoxic respiratory failure due to Strep pneumonia pneumonia, complicated with aspiration and healthcare associated pneumonia with Enterobacter, Klebsiella and stenotrophomonas now s/p trach -Patient tolerated trach collar for 24 hours, intermittently desats due to agitation and restlessness but oxygen saturation improves when she is calm and relaxed: Desaturated significantly to 70% today for about half an hour so PCCM was reconsulted and subsequently she improved with suctioning -Meropenem changed to Bactrim given improvement in renal function -Continue trach collar as long as patient can tolerate; PCCM will monitor weekly -Chest x-ray today showed "Progression of right lower lobe airspace disease. Progressive consolidation left lower lobe with overall decreased size of infiltrate." -SLP working with Passy-Muir valve -Continue pulm toileting and added Xopenex in addition to pupillary -SLP recommending continuing n.p.o. and ice chips as needed after oral care; MBS done and still recommending n.p.o. with ice chips to be continued after oral care given that she exhibited since aspiration of all trials including thin  liquids, nectar thick liquids and pure aspiration triggered coughing but was ineffective and expelling all of aspirated and aspirated material was expelled with subsequently aspirated again due to poor pharyngeal clearance -SLP feels that she may have acute on chronic dysphagia in the possible setting of  recent significant weight loss after use of lithium -PT OT recommending SNF versus AIR if she improves her tolerance -Palliative care consulted for further goals of care discussion   Acute septic/toxic encephalopathy-improved -Mental status has significantly improved, she is extremely anxious at baseline and has significant emotional lability -Patient is alert and awake and following commands -Avoid deep sedation -Decrease Klonopin to 1 mg in the morning and 2 mg at bedtime but will resume her home 0.5 mg p.o. twice daily -Change Dilaudid to as needed and monitor   New diagnosis of acute HFpEF  -Monitor intake and output  -She looks euvolemic -Hold diuretic for now -Chest x-ray done today and showed "Stable cardiomediastinal silhouette. Tracheostomy and feeding tubes are unchanged in position. Stable bibasilar opacities are noted concerning for atelectasis pneumonia. Small left pleural effusion is noted. Bony thorax is unremarkable." -Continue monitor for signs and symptoms of volume overload   Severe protein calorie malnutrition -Nutrition Status: Nutrition Problem: Severe Malnutrition Etiology: social / environmental circumstances (poor PO intake related to poor dentition after taking lithium) Signs/Symptoms: severe fat depletion, severe muscle depletion Interventions: Refer to RD note for recommendations -MBS done -Per my discussion with dietary today she will likely need a PEG tube and will discuss with the patient and she is is resistant against this currently.  I consulted palliative care given that she cannot keep her core track in place long-term   Acute rhabdomyolysis, resolved Hyperkalemia/Hypokalemia, resolved AKI due to septic ATN, rhabdomyolysis Hypernatremia Serum creatinine continues to improve Monitor intake and output -Avoid further nephrotoxic medications, contrast dyes, hypotension and dehydration ensure adequate renal perfusion and renally dose  medications -Patient's BUNs/creatinine is now 69/1.15 and improved significantly from a few days ago when it was 69/1.47 Serum sodium is trending down and has now normalized to 141 Continue free water flushes now 200 every 2 hours   Anxiety depression bipolar PTA Rib fx from CPR -Continue Klonopin, Depakote and Seroquel -Given her emotional lability and extreme mood swings we will consult psychiatry   Acute blood loss anemia on anemia of chronic disease -Patient hemoglobin has stabilized now -Monitor H&H and transfuse if less than 7 -Anemia panel done and showed an iron level of 61, U IBC of 93, TIBC of 154, saturation ratios of 40%, ferritin level of 415, folate of 17.3, vitamin B-12 level 1088 -Hemoglobin/hematocrit is now 7.9/24.4 yesterday and was stable but did drop slightly to 7.1/22.2 and will need to continue monitor and see if she needs to be transfused   Thrombocytosis -The patient's platelet count went from 413 and trended up to 546 and is now trending back down to 467 -Continue monitor and trend and repeat CBC in a.m.   Hypoalbuminemia -The patient albumin level is now gone from 1.9 is now 2.2 yesterday and today is now 1.8 -Continue to monitor trend and repeat CMP in a.m.    DVT prophylaxis: heparin injection 5,000 Units Start: 05/19/22 2200 SCDs Start: 05/19/22 2151    Code Status: Full Code Family Communication: No family currently at bedside  Disposition Plan:  Level of care: Progressive Status is: Inpatient Remains inpatient appropriate because: Has ineffective and inability to swallow so will need a long-term nutritional needs but she is  refused PEG    Consultants:  Palliative care Psychiatry Critical care  Procedures:  As delineated as above  Antimicrobials:  Anti-infectives (From admission, onward)    Start     Dose/Rate Route Frequency Ordered Stop   06/04/22 1045  sulfamethoxazole-trimethoprim (BACTRIM DS) 800-160 MG per tablet 2 tablet        2  tablet Oral Every 12 hours 06/04/22 0950     06/01/22 1030  meropenem (MERREM) 2 g in sodium chloride 0.9 % 100 mL IVPB  Status:  Discontinued        2 g 280 mL/hr over 30 Minutes Intravenous Every 12 hours 06/01/22 0933 06/04/22 0950   05/27/22 1600  piperacillin-tazobactam (ZOSYN) IVPB 3.375 g  Status:  Discontinued        3.375 g 12.5 mL/hr over 240 Minutes Intravenous Every 8 hours 05/27/22 1018 06/01/22 0930   05/27/22 1130  vancomycin (VANCOREADY) IVPB 1250 mg/250 mL  Status:  Discontinued        1,250 mg 166.7 mL/hr over 90 Minutes Intravenous Every 24 hours 05/27/22 1036 05/27/22 1037   05/27/22 1130  vancomycin (VANCOREADY) IVPB 1250 mg/250 mL  Status:  Discontinued        1,250 mg 166.7 mL/hr over 90 Minutes Intravenous Every 48 hours 05/27/22 1037 05/28/22 0832   05/27/22 1115  vancomycin (VANCOCIN) IVPB 1000 mg/200 mL premix  Status:  Discontinued        1,000 mg 200 mL/hr over 60 Minutes Intravenous  Once 05/27/22 1018 05/27/22 1036   05/27/22 1115  piperacillin-tazobactam (ZOSYN) IVPB 3.375 g        3.375 g 100 mL/hr over 30 Minutes Intravenous  Once 05/27/22 1018 05/27/22 1119   05/20/22 2200  vancomycin (VANCOREADY) IVPB 750 mg/150 mL  Status:  Discontinued        750 mg 150 mL/hr over 60 Minutes Intravenous Every 24 hours 05/19/22 2158 05/20/22 1017   05/20/22 2000  vancomycin (VANCOCIN) IVPB 1000 mg/200 mL premix  Status:  Discontinued        1,000 mg 200 mL/hr over 60 Minutes Intravenous Every 24 hours 05/19/22 2107 05/19/22 2158   05/20/22 1115  cefTRIAXone (ROCEPHIN) 2 g in sodium chloride 0.9 % 100 mL IVPB        2 g 200 mL/hr over 30 Minutes Intravenous Every 24 hours 05/20/22 1017 05/25/22 1042   05/20/22 1000  ceFEPIme (MAXIPIME) 2 g in sodium chloride 0.9 % 100 mL IVPB  Status:  Discontinued        2 g 200 mL/hr over 30 Minutes Intravenous Every 12 hours 05/19/22 2107 05/20/22 1017   05/19/22 2200  vancomycin (VANCOREADY) IVPB 1250 mg/250 mL        1,250  mg 166.7 mL/hr over 90 Minutes Intravenous  Once 05/19/22 2158 05/20/22 0116   05/19/22 2100  ceFEPIme (MAXIPIME) 2 g in sodium chloride 0.9 % 100 mL IVPB        2 g 200 mL/hr over 30 Minutes Intravenous  Once 05/19/22 2048 05/19/22 2208   05/19/22 2100  vancomycin (VANCOREADY) IVPB 1500 mg/300 mL  Status:  Discontinued        1,500 mg 150 mL/hr over 120 Minutes Intravenous  Once 05/19/22 2048 05/19/22 2158       Subjective: Seen and examined at bedside and she is extremely emotionally labile and had significant tearful episodes after I spoke with her about pursuing a PEG tube.  She got very sad and almost to her phone across  the room.  Patient started having some mania as well.  Denies any chest pain or shortness of breath.  Requesting her Klonopin given that she is very "jittery".  Objective: Vitals:   06/05/22 0747 06/05/22 1110 06/05/22 1200 06/05/22 1600  BP: 121/85 125/84  111/79  Pulse: 93 (!) 113  84  Resp: 20 (!) 21 (!) 21 19  Temp: 98.8 F (37.1 C) 98.1 F (36.7 C)  98.5 F (36.9 C)  TempSrc: Oral Oral  Oral  SpO2: 92% 91%  97%  Weight:      Height:        Intake/Output Summary (Last 24 hours) at 06/05/2022 1916 Last data filed at 06/05/2022 0051 Gross per 24 hour  Intake 660 ml  Output 0 ml  Net 660 ml   Filed Weights   05/31/22 0600 06/02/22 0335 06/03/22 0500  Weight: 57.2 kg 55.2 kg 56.3 kg   Examination: Physical Exam:  Constitutional: Thin Caucasian female who is extremely anxious and agitated Respiratory: Diminished to auscultation bilaterally with coarse breath sounds and she has a trach collar wearing ATC from her tracheostomy, no wheezing, rales, rhonchi or crackles.  Slightly increased respiratory effort and patient is not tachypenic. No accessory muscle use.  Cardiovascular: Tachycardic rate but regular rhythm. Abdomen: Soft, non-tender, non-distended.  Bowel sounds positive.  GU: Deferred. Musculoskeletal: No clubbing / cyanosis of digits/nails.  No joint deformity upper and lower extremities.  Skin: No rashes, lesions, ulcers on limited skin evaluation. No induration; Warm and dry.  Neurologic: CN 2-12 grossly intact with no focal deficits.  Romberg sign cerebellar reflexes not assessed.  Psychiatric: Extremely anxious and agitated and she is awake and alert  Data Reviewed: I have personally reviewed following labs and imaging studies  CBC: Recent Labs  Lab 05/31/22 0538 06/01/22 0215 06/02/22 0343 06/02/22 1316 06/03/22 0439 06/03/22 1139 06/04/22 1109 06/05/22 0106  WBC 26.0*  --  19.3*  --  9.2  --  10.9* 8.8  NEUTROABS  --   --  17.0*  --   --   --  7.9* 5.8  HGB 7.6*   < > 7.1* 7.1* 7.3* 7.8* 7.9* 7.1*  HCT 23.1*   < > 22.2* 21.0* 23.5* 23.0* 24.4* 22.2*  MCV 96.3  --  99.1  --  100.9*  --  96.4 98.2  PLT 433*  --  413*  --  451*  --  546* 467*   < > = values in this interval not displayed.   Basic Metabolic Panel: Recent Labs  Lab 05/30/22 0520 05/31/22 0538 06/01/22 0215 06/02/22 0343 06/02/22 1316 06/03/22 0439 06/03/22 1139 06/04/22 0857 06/05/22 0106  NA 145 148*   < > 151* 151* 148* 147* 144 141  K 3.0* 3.7   < > 3.5 3.7 3.8 3.8 3.7 3.5  CL 103 107  --  114*  --  112*  --  108 107  CO2 27 28  --  28  --  27  --  25 25  GLUCOSE 121* 120*  --  115*  --  94  --  101* 107*  BUN 79* 75*  --  69*  --  61*  --  62* 69*  CREATININE 1.90* 1.77*  --  1.47*  --  1.17*  --  1.05* 1.15*  CALCIUM 9.9 9.8  --  9.8  --  9.5  --  9.8 9.0  MG 1.8 1.9  --  2.2  --   --   --  1.9 1.8  PHOS  --   --   --  4.1  --   --   --  3.6 3.9   < > = values in this interval not displayed.   GFR: Estimated Creatinine Clearance: 51.4 mL/min (A) (by C-G formula based on SCr of 1.15 mg/dL (H)). Liver Function Tests: Recent Labs  Lab 06/02/22 0343 06/04/22 0857 06/05/22 0106  AST 21 26 20   ALT 23 25 21   ALKPHOS 61 63 57  BILITOT 0.5 0.4 0.4  PROT 5.9* 6.1* 4.8*  ALBUMIN 1.9* 2.2* 1.8*   No results for input(s):  "LIPASE", "AMYLASE" in the last 168 hours. No results for input(s): "AMMONIA" in the last 168 hours. Coagulation Profile: No results for input(s): "INR", "PROTIME" in the last 168 hours. Cardiac Enzymes: No results for input(s): "CKTOTAL", "CKMB", "CKMBINDEX", "TROPONINI" in the last 168 hours. BNP (last 3 results) No results for input(s): "PROBNP" in the last 8760 hours. HbA1C: No results for input(s): "HGBA1C" in the last 72 hours. CBG: Recent Labs  Lab 06/05/22 0008 06/05/22 0403 06/05/22 0757 06/05/22 1138 06/05/22 1548  GLUCAP 90 111* 75 99 87   Lipid Profile: No results for input(s): "CHOL", "HDL", "LDLCALC", "TRIG", "CHOLHDL", "LDLDIRECT" in the last 72 hours. Thyroid Function Tests: No results for input(s): "TSH", "T4TOTAL", "FREET4", "T3FREE", "THYROIDAB" in the last 72 hours. Anemia Panel: Recent Labs    06/05/22 0106  VITAMINB12 1,088*  FOLATE 17.3  FERRITIN 415*  TIBC 154*  IRON 61  RETICCTPCT 2.4   Sepsis Labs: Recent Labs  Lab 05/30/22 0520  PROCALCITON 0.76    Recent Results (from the past 240 hour(s))  MRSA Next Gen by PCR, Nasal     Status: None   Collection Time: 05/27/22 10:43 AM   Specimen: Nasal Mucosa; Nasal Swab  Result Value Ref Range Status   MRSA by PCR Next Gen NOT DETECTED NOT DETECTED Final    Comment: (NOTE) The GeneXpert MRSA Assay (FDA approved for NASAL specimens only), is one component of a comprehensive MRSA colonization surveillance program. It is not intended to diagnose MRSA infection nor to guide or monitor treatment for MRSA infections. Test performance is not FDA approved in patients less than 66 years old. Performed at [facility], [address], [place], [address] [place]   Culture, Respiratory w Gram Stain     Status: None   Collection Time: 05/30/22  9:23 AM   Specimen: Tracheal Aspirate; Respiratory  Result Value Ref Range Status   Specimen Description TRACHEAL ASPIRATE  Final   Special Requests NONE   Final   Gram Stain   Final    MODERATE WBC PRESENT,BOTH PMN AND MONONUCLEAR FEW GRAM NEGATIVE RODS FEW SQUAMOUS EPITHELIAL CELLS PRESENT Performed at [facility], [address] [place], [address] [place]    Culture   Final    FEW ENTEROBACTER CLOACAE FEW STENOTROPHOMONAS MALTOPHILIA    Report Status 06/02/2022 FINAL  Final   Organism ID, Bacteria ENTEROBACTER CLOACAE  Final   Organism ID, Bacteria STENOTROPHOMONAS MALTOPHILIA  Final      Susceptibility   Enterobacter cloacae - MIC*    CEFAZOLIN >=64 RESISTANT Resistant     CEFEPIME 4 INTERMEDIATE Intermediate     CEFTAZIDIME >=64 RESISTANT Resistant     CIPROFLOXACIN <=0.25 SENSITIVE Sensitive     GENTAMICIN <=1 SENSITIVE Sensitive     IMIPENEM <=0.25 SENSITIVE Sensitive     TRIMETH/SULFA <=20 SENSITIVE Sensitive     PIP/TAZO >=128 RESISTANT  Resistant     * FEW ENTEROBACTER CLOACAE   Stenotrophomonas maltophilia - MIC*    LEVOFLOXACIN 0.25 SENSITIVE Sensitive     TRIMETH/SULFA <=20 SENSITIVE Sensitive     * FEW STENOTROPHOMONAS MALTOPHILIA     Radiology Studies: DG Swallowing Func-Speech Pathology  Result Date: 06/05/2022 Table formatting from the original result was not included. Images from the original result were not included. Objective Swallowing Evaluation: Type of Study: MBS-Modified Barium Swallow Study  Patient Details Name: Payzlee Ryder MRN: [id number] Date of Birth: 08/07/1970 Today's Date: 06/05/2022 Time: SLP Start Time (ACUTE ONLY): 1440 -SLP Stop Time (ACUTE ONLY): 1500 SLP Time Calculation (min) (ACUTE ONLY): 20 min Past Medical History: Past Medical History: Diagnosis Date  Prolapse, disk  Past Surgical History: Past Surgical History: Procedure Laterality Date  NECK SURGERY   HPI: Pt is a 51 y.o. female admitted 05/19/22 after cardiac arrest at home, ROSC 26 min, hypothermic. ETT 10/17-10/23; reintubated 10/23-trach10/25. Pt with new HFpEF. MRI brain negative for acute changes. CXR 10/30: Stable multifocal  pulmonary infiltrates and small left pleural effusion. PMH: bipolar disorder.  No data recorded  Recommendations for follow up therapy are one component of a multi-disciplinary discharge planning process, led by the attending physician.  Recommendations may be updated based on patient status, additional functional criteria and insurance authorization. Assessment / Plan / Recommendation   06/05/2022   3:40 PM Clinical Impressions Clinical Impression Pt presents with severe pharyngeal dysphagia characterized by reduced tongue base retraction, pharyngeal constriction, hyolaryngeal elevation, and anterior laryngeal movement. She demonstrated vallecular residue, limited epiglottic movement, and reduced airway closure. She exhibited sensed aspiration (PAS 7) of all trials including thin liquids, nectar thick liquids, and puree. Aspiration triggered coughing, but this was ineffective in expelling all of the aspirate and the aspirated material that was expelled was subsequently aspirated again due to poor pharyngeal clearance. No functional benefit was noted with left and right head turns. A chin tuck posture with effortful swallow did facilitate epiglottic movement to about horizontal position, but full epiglottic inversion was not demonstrated throughout this study despite efforts and this increases pt's risk of aspiration during and after deglutition. The etiology of this severe of a presentation is unclear considering pt's negative MRI, but acute on chronic dysphagia is possible in the setting of recent significant weight loss after use of lithium. It is recommended that the pt's NPO status be maintained, but that ice chips continue to be allowed. SLP will follow for dysphagia treatment. SLP Visit Diagnosis Dysphagia, unspecified (R13.10) Impact on safety and function Moderate aspiration risk      No data to display        06/05/2022   3:40 PM Prognosis Prognosis for Safe Diet Advancement Fair Barriers to Reach Goals  Motivation;Severity of deficits;Behavior Barriers/Prognosis Comment cooperation   06/05/2022   3:40 PM Diet Recommendations SLP Diet Recommendations NPO;Alternative means - temporary;Ice chips PRN after oral care Medication Administration Via alternative means     06/05/2022   3:40 PM Other Recommendations Oral Care Recommendations Oral care QID;Oral care before and after PO Follow Up Recommendations Skilled nursing-short term rehab (<3 hours/day) Assistance recommended at discharge Frequent or constant Supervision/Assistance Functional Status Assessment Patient has had a recent decline in their functional status and demonstrates the ability to make significant improvements in function in a reasonable and predictable amount of time.   06/05/2022   3:40 PM Frequency and Duration  Speech Therapy Frequency (ACUTE ONLY) min 2x/week Treatment Duration 2 weeks  06/05/2022   3:40 PM [facility]    06/05/2022   3:40 PM Pharyngeal Phase Pharyngeal Phase Impaired Pharyngeal- Nectar Cup Pharyngeal residue - valleculae;Pharyngeal residue - pyriform;Reduced tongue base retraction;Reduced airway/laryngeal closure;Reduced laryngeal elevation;Reduced anterior laryngeal mobility;Reduced epiglottic inversion;Penetration/Aspiration during swallow;Penetration/Apiration after swallow Pharyngeal Material enters airway, passes BELOW cords and not ejected out despite cough attempt by patient Pharyngeal- Thin Cup Pharyngeal residue - valleculae;Pharyngeal residue - pyriform;Reduced tongue base retraction;Reduced airway/laryngeal closure;Reduced laryngeal elevation;Reduced anterior laryngeal mobility;Reduced epiglottic inversion;Penetration/Aspiration during swallow;Penetration/Apiration after swallow Pharyngeal Material enters airway, passes BELOW cords and not ejected out despite cough attempt by patient Pharyngeal- Puree Pharyngeal residue - valleculae;Pharyngeal residue - pyriform;Reduced tongue base retraction;Reduced  airway/laryngeal closure;Reduced laryngeal elevation;Reduced anterior laryngeal mobility;Reduced epiglottic inversion;Penetration/Aspiration during swallow;Penetration/Apiration after swallow Pharyngeal Material enters airway, passes BELOW cords and not ejected out despite cough attempt by patient    06/05/2022   3:40 PM Cervical Esophageal Phase  Cervical Esophageal Phase Impaired Nectar Cup Reduced cricopharyngeal relaxation Thin Cup Reduced cricopharyngeal relaxation Shanika I. Vear ClockPhillips, MS, CCC-SLP Acute Rehabilitation Services Office number [phone] Scheryl MartenShanika I Phillips 06/05/2022, 4:57 PM                     DG CHEST PORT 1 VIEW  Result Date: 06/05/2022 CLINICAL DATA:  Shortness of breath. EXAM: PORTABLE CHEST 1 VIEW COMPARISON:  June 04, 2022. FINDINGS: Stable cardiomediastinal silhouette. Tracheostomy and feeding tubes are unchanged in position. Stable bibasilar opacities are noted concerning for atelectasis pneumonia. Small left pleural effusion is noted. Bony thorax is unremarkable. IMPRESSION: Stable support apparatus. Stable bibasilar opacities as described above. Electronically Signed   By: Lupita RaiderJames  Green Jr M.D.   On: 06/05/2022 08:43   DG CHEST PORT 1 VIEW  Result Date: 06/04/2022 CLINICAL DATA:  Short of breath. EXAM: PORTABLE CHEST 1 VIEW COMPARISON:  Chest 06/01/2022 FINDINGS: Tracheostomy remains in good position. Feeding tube enters the stomach with the tip not visualized Bibasilar airspace disease. Progressive consolidation right lung base. Progressive consolidation left lower lobe with overall decrease size of infiltrate. Small bilateral pleural effusions. IMPRESSION: Progression of right lower lobe airspace disease Progressive consolidation left lower lobe with overall decreased size of infiltrate. Electronically Signed   By: Marlan Palauharles  Clark M.D.   On: 06/04/2022 10:22    Scheduled Meds:  acetaminophen (TYLENOL) oral liquid 160 mg/5 mL  650 mg Per Tube QID   arformoterol  15 mcg  Nebulization BID   ascorbic acid  500 mg Per Tube BID   Chlorhexidine Gluconate Cloth  6 each Topical Daily   clonazePAM  2 mg Per Tube QHS   famotidine  20 mg Per Tube BID   feeding supplement (VITAL AF 1.2 CAL)  1,000 mL Per Tube Q24H   fiber supplement (BANATROL TF)  60 mL Per Tube BID   folic acid  1 mg Per Tube Daily   free water  200 mL Per Tube Q2H   gabapentin  300 mg Per Tube BID   heparin  5,000 Units Subcutaneous Q8H   insulin aspart  0-9 Units Subcutaneous Q4H   leptospermum manuka honey  1 Application Topical Daily   levalbuterol  0.63 mg Nebulization Q6H   metoprolol tartrate  25 mg Per Tube Q8H   nutrition supplement (JUVEN)  1 packet Per Tube BID BM   mouth rinse  15 mL Mouth Rinse 4 times per day   polyethylene glycol  17 g Per Tube Daily   QUEtiapine  100 mg Per Tube  QHS   QUEtiapine  50 mg Per Tube Daily   revefenacin  175 mcg Nebulization Daily   senna-docusate  1 tablet Oral QHS   sodium chloride HYPERTONIC  4 mL Nebulization Q6H   sulfamethoxazole-trimethoprim  2 tablet Oral Q12H   thiamine  100 mg Per Tube Daily   valproic acid  250 mg Per Tube BID   vitamin A  10,000 Units Oral Daily   Continuous Infusions:  sodium chloride     sodium chloride Stopped (05/27/22 1640)    LOS: 17 days   Marguerita Merles, DO [facility] Available via Epic secure chat 7am-7pm After these hours, please refer to coverage provider listed on [email or web address] 06/05/2022, 7:16 PM

## 2022-06-05 NOTE — Progress Notes (Signed)
Modified Barium Swallow Progress Note  Patient Details  Name: Monica Hopkins MRN: [id number] Date of Birth: 26-Jul-1971  Today's Date: 06/05/2022  Modified Barium Swallow completed.  Full report located under Chart Review in the Imaging Section.  Brief recommendations include the following:  Clinical Impression  Pt presents with severe pharyngeal dysphagia characterized by reduced tongue base retraction, pharyngeal constriction, hyolaryngeal elevation, and anterior laryngeal movement. She demonstrated vallecular residue, limited epiglottic movement, and reduced airway closure. She exhibited sensed aspiration (PAS 7) of all trials including thin liquids, nectar thick liquids, and puree. Aspiration triggered coughing, but this was ineffective in expelling all of the aspirate and the aspirated material that was expelled was subsequently aspirated again due to poor pharyngeal clearance. No functional benefit was noted with left and right head turns. A chin tuck posture with effortful swallow did facilitate epiglottic movement to about horizontal position, but full epiglottic inversion was not demonstrated throughout this study despite efforts and this increases pt's risk of aspiration during and after deglutition. The etiology of this severe of a presentation is unclear considering pt's negative MRI, but acute on chronic dysphagia is possible in the setting of recent significant weight loss after use of lithium. It is recommended that the pt's NPO status be maintained, but that ice chips continue to be allowed. SLP will follow for dysphagia treatment.   Swallow Evaluation Recommendations       SLP Diet Recommendations: NPO;Alternative means - temporary;Ice chips PRN after oral care       Medication Administration: Via alternative means               Oral Care Recommendations: Oral care QID;Oral care before and after PO     Kilian Schwartz I. Hardin Negus, [place], [place] Office number [place] 06/05/2022,4:49 PM

## 2022-06-05 NOTE — Progress Notes (Signed)
Occupational Therapy Treatment Patient Details Name: Monica Hopkins MRN: [id number] DOB: February 13, 1971 Today's Date: 06/05/2022   History of present illness Pt is a 51 y.o. female admitted 05/19/22 with cardiac arrest at home, ROSC 26 min, hypothermic. Intubated 10/17, failed extubation 10/23; trach placed 10/25. Pt with new HFpEF. PMH includes bipolar disorder.   OT comments  Worked with pt with bed in chair position on trunk strengthening, sitting balance, B shoulder strengthening, grooming and cognitive activities with her smart phone. Pt alert and participating well. Demonstrating improved head and trunk control, ability to bring hand to mouth for grooming and ability to follow commands. Pt with emotional lability at times, but easily redirected. PSMV donned with stable VS throughout session with pt using voice to send text messages to her mom. Pt's visual acuity precludes her from being able to see small keyboard, does not improve with glasses.    Recommendations for follow up therapy are one component of a multi-disciplinary discharge planning process, led by the attending physician.  Recommendations may be updated based on patient status, additional functional criteria and insurance authorization.    Follow Up Recommendations  Skilled nursing-short term rehab (<3 hours/day)    Assistance Recommended at Discharge Frequent or constant Supervision/Assistance  Patient can return home with the following  Two people to help with walking and/or transfers;A lot of help with bathing/dressing/bathroom;Assistance with cooking/housework;Direct supervision/assist for medications management;Direct supervision/assist for financial management;Assist for transportation;Help with stairs or ramp for entrance   Equipment Recommendations  Other (comment) (defer to next venue)    Recommendations for Other Services      Precautions / Restrictions Precautions Precautions: Fall;Other (comment) Precaution  Comments: cortrak, trach Restrictions Weight Bearing Restrictions: No       Mobility Bed Mobility Overal bed mobility: Needs Assistance Bed Mobility: Supine to Sit       Sit to supine: Supervision   General bed mobility comments: bed in chair position, worked on pulling forward from back of bed with HOB gradually declined to increase difficulty using bed rails    Transfers                         Balance       Sitting balance - Comments: pt now with head control in sitting, reports her neck tires, but that was typically prior to admission                                   ADL either performed or assessed with clinical judgement   ADL Overall ADL's : Needs assistance/impaired     Grooming: Oral care;Sitting;Supervision/safety           Upper Body Dressing : Minimal assistance                          Extremity/Trunk Assessment              Vision   Additional Comments: pt reports difficulty seeing small icons on her phone, but can read the large print of her texts, her glasses are not helpful   Perception     Praxis      Cognition Arousal/Alertness: Awake/alert Behavior During Therapy: Anxious (tearful when OT arrived) Overall Cognitive Status: Impaired/Different from baseline Area of Impairment: Attention, Memory, Awareness, Problem solving  Current Attention Level: Sustained Memory: Decreased short-term memory     Awareness: Emergent Problem Solving: Slow processing, Difficulty sequencing, Requires verbal cues General Comments: pt tearful after daughter sent her pictures of her granddaughters, wants to go home and help with them        Exercises Other Exercises Other Exercises: AAROM B shoulders x 10 each, place and hold to count of 3 x 5 FF and abduction    Shoulder Instructions       General Comments Worked with pt on accessing her phone with PSMV donned and using microphone  to text her mother.    Pertinent Vitals/ Pain       Pain Assessment Pain Assessment: Faces Faces Pain Scale: Hurts a little bit Pain Location: generalized Pain Descriptors / Indicators: Discomfort Pain Intervention(s): RN gave pain meds during session  Home Living                                          Prior Functioning/Environment              Frequency  Min 2X/week        Progress Toward Goals  OT Goals(current goals can now be found in the care plan section)  Progress towards OT goals: Progressing toward goals  Acute Rehab OT Goals OT Goal Formulation: With patient Time For Goal Achievement: 06/12/22 Potential to Achieve Goals: Good  Plan Discharge plan needs to be updated    Co-evaluation                 AM-PAC OT "6 Clicks" Daily Activity     Outcome Measure   Help from another person eating meals?: Total Help from another person taking care of personal grooming?: A Lot Help from another person toileting, which includes using toliet, bedpan, or urinal?: Total Help from another person bathing (including washing, rinsing, drying)?: A Lot Help from another person to put on and taking off regular upper body clothing?: A Lot Help from another person to put on and taking off regular lower body clothing?: Total 6 Click Score: 9    End of Session Equipment Utilized During Treatment: Oxygen  OT Visit Diagnosis: Muscle weakness (generalized) (M62.81);Other symptoms and signs involving cognitive function;Pain   Activity Tolerance Patient tolerated treatment well   Patient Left in bed;with call bell/phone within reach;with nursing/sitter in room   Nurse Communication Other (comment) (aware PSMV donned)        Time: 6269-4854 OT Time Calculation (min): 44 min  Charges: OT General Charges $OT Visit: 1 Visit OT Treatments $Self Care/Home Management : 23-37 mins $Therapeutic Exercise: 8-22 mins  Cleta Alberts, OTR/L Acute  Rehabilitation Services Office: [phone]   Malka So 06/05/2022, 1:08 PM

## 2022-06-05 NOTE — Progress Notes (Signed)
Speech Language Pathology Treatment: Dysphagia  Patient Details Name: Monica Hopkins MRN: [id number] DOB: May 19, 1971 Today's Date: 06/05/2022 Time: 9326-7124 SLP Time Calculation (min) (ACUTE ONLY): 14 min  Assessment / Plan / Recommendation Clinical Impression  Pt was seen for dysphagia treatment. Pt was educated regarding the results of the modified barium swallow study, SLP's concern regarding her performance and for dysphagia-related adverse events, and the rationale for SLP's recommendations for an NPO status. Pt was reminded of images shown during the swallow study and she was able to verbalize her performance with minimal prompts. She verbalized understanding and agreement regarding all areas of education. Pt was educated regarding the allowance of ice chips and SLP's recommendation to continue use of effortful swallows to improve function. All of the pt's questions were answered to her satisfaction. SLP will continue to follow pt.    HPI HPI: Pt is a 51 y.o. female admitted 05/19/22 after cardiac arrest at home, ROSC 26 min, hypothermic. ETT 10/17-10/23; reintubated 10/23-trach10/25. Pt with new HFpEF. MRI brain negative for acute changes. CXR 10/30: Stable multifocal pulmonary infiltrates and small left pleural effusion. PMH: bipolar disorder.      SLP Plan  Continue with current plan of care      Recommendations for follow up therapy are one component of a multi-disciplinary discharge planning process, led by the attending physician.  Recommendations may be updated based on patient status, additional functional criteria and insurance authorization.    Recommendations  Diet recommendations: NPO (ice chips allowed) Medication Administration: Via alternative means      Patient may use Passy-Muir Speech Valve: During all therapies with supervision PMSV Supervision: Full         Oral Care Recommendations: Oral care prior to ice chip/H20 Follow Up Recommendations: Skilled  nursing-short term rehab (<3 hours/day) Assistance recommended at discharge: Frequent or constant Supervision/Assistance SLP Visit Diagnosis: [name] (R49.1) Plan: Continue with current plan of care        Monica Hopkins, [place], [place] Office number [phone]     Horton Marshall  06/05/2022, 5:09 PM

## 2022-06-05 NOTE — Progress Notes (Signed)
Speech Language Pathology Treatment: [name] Speaking valve;Dysphagia  Patient Details Name: Monica Hopkins MRN: [id number] DOB: [birth year] Today's Date: 06/05/2022 Time: 1194-1740 SLP Time Calculation (min) (ACUTE ONLY): 25 min  Assessment / Plan / Recommendation Clinical Impression  Pt was seen for treatment with her mother present for part of the session. Pt was upset after reportedly being told that she and her family needed to decide on her having a feeding tube. Pt's SpO2 was in the 70s upon SLP's arrival. Monica Hopkins, [place] was contacted and performed tracheal suctioning. With this, pt's SpO2 improved to the 90s. Pt tolerated PMSV for over 20 minutes with stable vitals (SpO2 90-93; RR 23-26) and no evidence of back pressure. Pt demonstrated a hoarse vocal quality and mildly reduced respiratory support for speech, but she was able to participate in conversation and vocal intensity was WNL. Pt's level of cooperation and participation were notably improved compared to yesterday. She tolerated thin liquids and puree without immediate signs of aspiration, but exhibited a delayed wet vocal quality. It is noteworthy that a wet vocal quality was also noted at baseline and that pt's cough was too weak to significantly improve it. A modified barium swallow study is recommended to further assess swallow physiology and it is scheduled for today at 1400.    HPI HPI: Pt is a 51 y.o. female admitted 05/19/22 after cardiac arrest at home, ROSC 26 min, hypothermic. ETT 10/17-10/23; reintubated 10/23-trach10/25. Pt with new HFpEF. MRI brain negative for acute changes. CXR 10/30: Stable multifocal pulmonary infiltrates and small left pleural effusion. PMH: bipolar disorder.      SLP Plan  MBS      Recommendations for follow up therapy are one component of a multi-disciplinary discharge planning process, led by the attending physician.  Recommendations may be updated based on patient status, additional functional  criteria and insurance authorization.    Recommendations  Diet recommendations: NPO; ice chips may be continued after oral care Medication Administration: Via alternative means      Patient may use Passy-Muir Speech Valve: During all therapies with supervision PMSV Supervision: Full         Oral Care Recommendations: Oral care prior to ice chip/H20 Follow Up Recommendations: Skilled nursing-short term rehab (<3 hours/day) Assistance recommended at discharge: Frequent or constant Supervision/Assistance SLP Visit Diagnosis: [name] (R49.1) Plan: MBS         Trustin Chapa I. Hardin Negus, [place], [place] Office number [phone]  Horton Marshall  06/05/2022, 12:34 PM

## 2022-06-05 NOTE — Consult Note (Signed)
Palliative Medicine Inpatient Consult Note Consulting Provider: Dr. Alfredia Hopkins  Reason for consult:   [place] Palliative Medicine Consult  Reason for Consult? [place]; Not safe for swallowing and Refusing PEG   06/05/2022  HPI:  Per intake H&P --> Patient is a 51 year old Caucasian female with a past medical history significant for but limited to bipolar disorder who after cardiac arrest. Her husband came home she is alert and talking and then he went about a minute about 10 minutes later he got back to find her unconscious.  Palliative care has been asked to get involved for goals of care conversations in the setting of the need for a gastrostomy tube.  Clinical Assessment/Goals of Care:  *Please note that this is a verbal dictation therefore any spelling or grammatical errors are due to the "[place] [facility]" system interpretation.  I have reviewed medical records including EPIC notes, labs and imaging, received report from bedside RN, assessed the patient who was sleeping as she had just returned to her room.    I called patient's mother, Monica Hopkins to further discuss diagnosis prognosis, [name], EOL wishes, disposition and options.   I introduced Palliative Medicine as specialized medical care for people living with serious illness. It focuses on providing relief from the symptoms and stress of a serious illness. The goal is to improve quality of life for both the patient and the family.  Medical History Review and Understanding:  A formal review of Monica Hopkins's past medical history inclusive of her bipolar disorder, failure to thrive, and poor oral care was held.  Social History:  Monica Hopkins has been living in a [place] in [place].  She has a husband though they are legally separated.  She has 2 children and 5 grandchildren.  She was not presently holding a job.  She is a woman of faith and practices within the [employer] denomination  Functional and  Nutritional State:  Prior to admission Monica Hopkins had been per her mother declining for a number of years.  She had lost well over 150 pounds.  She was able to perform self-care though minimally.  Advance Directives:  A detailed discussion was had today regarding advanced directives.  Patient does have advanced directives which can be found in [place].  Code Status:  At this time patient remains a full code given that she is already survived cardiopulmonary resuscitation.  At this time reviewed with patients mother the importance of discussion regarding long term nutrition given patients profound cachexia.   Discussion:  Reviewed patient's present clinical state with her mother.  Her mother expresses that she has been declining for over 3 years now.  She shares that the patient's medications for her bipolar disorder have been altered over the years causing either rapid weight gain or rapid weight loss.  Her mother goes on to share that in addition she has struggled with alcohol abuse as well as benzodiazepine and opioid use.  Patient's mother is not sure she goes to a pain clinic though has in the past pound Suboxone in her purse.  Discussed patient's present clinical state in the setting of her prolonged hospitalization due to pulmonary/cardiac arrest.  Reviewed the importance of a meeting with patient and her surrogate decision makers for further decision. Patients mom feels that Monica Hopkins can make this decision for herself at this time. I shared that her support may help which Monica Hopkins did not disagree with.   Patients mother goes on to share with me that Monica Hopkins cannot  come home to live with her. She shares that this is not an option unless Monica Hopkins can perform self care. Monica Hopkins states that she will not "change Monica Hopkins's diapers" and provide 24/7 supportive care for her at this point in time.   Discussed the importance of continued conversation with family and their  medical providers regarding overall  plan of care and treatment options, ensuring decisions are within the context of the patients values and GOCs.  Decision Maker: Monica Hopkins (Mother): [phone] (Mobile)   SUMMARY OF RECOMMENDATIONS   Full code at this time  Additional conversations to take place tomorrow regarding the thought of PEG tube, habe requested patients mother's presence  Per chart review patient does have a h/o opiate abuse and is followed by a pain clinic, will try to further identify which one  Psychiatry may be of help given Monica Hopkins's complicated h/o bipolar disorder and at times lack of compliance with medications  Ongoing palliative care and support  Code Status/Advance Care Planning: FULL CODE  Palliative Prophylaxis:  Aspiration, Bowel Regimen, Delirium Protocol, Frequent Pain Assessment, Oral Care, Palliative Wound Care, and Turn Reposition  Additional Recommendations (Limitations, Scope, Preferences): Continue current care  Psycho-social/Spiritual:  Desire for further Chaplaincy support: Not presently  Additional Recommendations: Discussion of acute on chronic disease trajectory   Prognosis: Very poor overall, sustained recent cardiac arrest, has endured a prolonged hospitalization, has advance frailty and malnutrition. Extremely high mortality risk within the next year.  Discharge Planning: Unclear presently.   Vitals:   06/05/22 1200 06/05/22 1600  BP:  111/79  Pulse:  84  Resp: (!) 21 19  Temp:  98.5 F (36.9 C)  SpO2:  97%    Intake/Output Summary (Last 24 hours) at 06/05/2022 1643 Last data filed at 06/05/2022 0051 Gross per 24 hour  Intake 660 ml  Output 0 ml  Net 660 ml   Last Weight  Most recent update: 06/03/2022  5:57 AM    Weight  56.3 kg (124 lb 1.9 oz)            Gen: Exceptionally frail middle-aged woman Caucasian woman HEENT: Core-track in right nare dry  mucous membranes CV: Regular rate and rhythm PULM: On 10 L tracheostomy collar ABD:  soft/nontender EXT: No edema Neuro: Sleeping  PPS: 30-40%   This conversation/these recommendations were discussed with patient primary care team, Dr. Marland Hopkins  Billing based on MDM: High   Problems Addressed: One acute or chronic illness or injury that poses a threat to life or bodily function  Amount and/or Complexity of Data: Category 3:Discussion of management or test interpretation with external physician/other qualified health care professional/appropriate source (not separately reported)  Risks: Decision regarding hospitalization or escalation of hospital care and Decision not to resuscitate or to de-escalate care because of poor prognosis ______________________________________________________ Lamarr Lulas [place] Palliative Medicine Team Team Cell Phone: [phone] Please utilize secure chat with additional questions, if there is no response within 30 minutes please call the above phone number  Palliative Medicine Team providers are available by phone from 7am to 7pm daily and can be reached through the team cell phone.  Should this patient require assistance outside of these hours, please call the patient's attending physician.

## 2022-06-06 ENCOUNTER — Inpatient Hospital Stay (HOSPITAL_COMMUNITY): Payer: Medicaid Other

## 2022-06-06 DIAGNOSIS — Z789 Other specified health status: Secondary | ICD-10-CM

## 2022-06-06 DIAGNOSIS — I469 Cardiac arrest, cause unspecified: Secondary | ICD-10-CM

## 2022-06-06 LAB — GLUCOSE, CAPILLARY
Glucose-Capillary: 114 mg/dL — ABNORMAL HIGH (ref 70–99)
Glucose-Capillary: 76 mg/dL (ref 70–99)
Glucose-Capillary: 100 mg/dL — ABNORMAL HIGH (ref 70–99)
Glucose-Capillary: 105 mg/dL — ABNORMAL HIGH (ref 70–99)
Glucose-Capillary: 149 mg/dL — ABNORMAL HIGH (ref 70–99)
Glucose-Capillary: 109 mg/dL — ABNORMAL HIGH (ref 70–99)
Glucose-Capillary: 110 mg/dL — ABNORMAL HIGH (ref 70–99)

## 2022-06-06 LAB — COMPREHENSIVE METABOLIC PANEL
ALT: 24 U/L (ref 0–44)
AST: 31 U/L (ref 15–41)
Albumin: 2.1 g/dL — ABNORMAL LOW (ref 3.5–5.0)
Alkaline Phosphatase: 62 U/L (ref 38–126)
Anion gap: 9 (ref 5–15)
BUN: 58 mg/dL — ABNORMAL HIGH (ref 6–20)
CO2: 21 mmol/L — ABNORMAL LOW (ref 22–32)
Calcium: 8.7 mg/dL — ABNORMAL LOW (ref 8.9–10.3)
Chloride: 106 mmol/L (ref 98–111)
Creatinine, Ser: 1.19 mg/dL — ABNORMAL HIGH (ref 0.44–1.00)
GFR, Estimated: 55 mL/min — ABNORMAL LOW (ref >60)
Glucose, Bld: 137 mg/dL — ABNORMAL HIGH (ref 70–99)
Potassium: 3.7 mmol/L (ref 3.5–5.1)
Sodium: 136 mmol/L (ref 135–145)
Total Bilirubin: 0.3 mg/dL (ref 0.3–1.2)
Total Protein: 5.5 g/dL — ABNORMAL LOW (ref 6.5–8.1)

## 2022-06-06 LAB — BLOOD GAS, ARTERIAL
Acid-Base Excess: 2 mmol/L (ref 0.0–2.0)
Bicarbonate: 25.6 mmol/L (ref 20.0–28.0)
O2 Saturation: 94.9 %
Patient temperature: 36.7
pCO2 arterial: 35 mmHg (ref 32–48)
pH, Arterial: 7.47 — ABNORMAL HIGH (ref 7.35–7.45)
pO2, Arterial: 64 mmHg — ABNORMAL LOW (ref 83–108)

## 2022-06-06 LAB — CBC WITH DIFFERENTIAL/PLATELET
Abs Immature Granulocytes: 0.07 10*3/uL (ref 0.00–0.07)
Basophils Absolute: 0.1 10*3/uL (ref 0.0–0.1)
Basophils Relative: 1 %
Eosinophils Absolute: 0.3 10*3/uL (ref 0.0–0.5)
Eosinophils Relative: 3 %
HCT: 24 % — ABNORMAL LOW (ref 36.0–46.0)
Hemoglobin: 7.7 g/dL — ABNORMAL LOW (ref 12.0–15.0)
Immature Granulocytes: 1 %
Lymphocytes Relative: 19 %
Lymphs Abs: 2 10*3/uL (ref 0.7–4.0)
MCH: 30.7 pg (ref 26.0–34.0)
MCHC: 32.1 g/dL (ref 30.0–36.0)
MCV: 95.6 fL (ref 80.0–100.0)
Monocytes Absolute: 0.5 10*3/uL (ref 0.1–1.0)
Monocytes Relative: 5 %
Neutro Abs: 7.4 10*3/uL (ref 1.7–7.7)
Neutrophils Relative %: 71 %
Platelets: 501 10*3/uL — ABNORMAL HIGH (ref 150–400)
RBC: 2.51 MIL/uL — ABNORMAL LOW (ref 3.87–5.11)
RDW: 15.8 % — ABNORMAL HIGH (ref 11.5–15.5)
WBC: 10.3 10*3/uL (ref 4.0–10.5)
nRBC: 0 % (ref 0.0–0.2)

## 2022-06-06 LAB — MAGNESIUM: Magnesium: 1.7 mg/dL (ref 1.7–2.4)

## 2022-06-06 LAB — PHOSPHORUS: Phosphorus: 4.1 mg/dL (ref 2.5–4.6)

## 2022-06-06 MED ORDER — CLONAZEPAM 0.5 MG PO TABS
0.5000 mg | ORAL_TABLET | Freq: Every day | ORAL | Status: DC
  Administered 2022-06-07 – 2022-06-23 (×17): 0.5 mg
  Filled 2022-06-06 (×17): qty 1

## 2022-06-06 MED ORDER — QUETIAPINE FUMARATE 50 MG PO TABS
50.0000 mg | ORAL_TABLET | Freq: Three times a day (TID) | ORAL | Status: DC | PRN
  Administered 2022-06-06 – 2022-06-08 (×5): 50 mg via ORAL
  Filled 2022-06-06 (×4): qty 1
  Filled 2022-06-06: qty 2

## 2022-06-06 MED ORDER — ALBUTEROL SULFATE (2.5 MG/3ML) 0.083% IN NEBU
2.5000 mg | INHALATION_SOLUTION | RESPIRATORY_TRACT | Status: DC
  Administered 2022-06-06 – 2022-06-10 (×16): 2.5 mg via RESPIRATORY_TRACT
  Filled 2022-06-06 (×14): qty 3

## 2022-06-06 MED ORDER — LEVALBUTEROL HCL 0.63 MG/3ML IN NEBU
0.6300 mg | INHALATION_SOLUTION | Freq: Three times a day (TID) | RESPIRATORY_TRACT | Status: DC
  Filled 2022-06-06: qty 3

## 2022-06-06 MED ORDER — SODIUM CHLORIDE 3 % IN NEBU
4.0000 mL | INHALATION_SOLUTION | Freq: Two times a day (BID) | RESPIRATORY_TRACT | Status: DC
  Administered 2022-06-07 – 2022-06-20 (×25): 4 mL via RESPIRATORY_TRACT
  Filled 2022-06-06 (×28): qty 4

## 2022-06-06 MED ORDER — TRAZODONE HCL 50 MG PO TABS
50.0000 mg | ORAL_TABLET | Freq: Every evening | ORAL | Status: DC | PRN
  Administered 2022-06-06 – 2022-06-24 (×13): 50 mg via ORAL
  Filled 2022-06-06 (×14): qty 1

## 2022-06-06 NOTE — Progress Notes (Signed)
RT called to pt's room due to pt sats in the 70's. RT increased ATC to 98% with not much of a change in saturations. RT suctioned pt and assessed inner cannula. RT then inflated the cuff and bagged the pt back up to 88%. Pt tolerated being bagged for about 3 mins until pt did not tolerate. RT then deflated the cuff and placed pt back on trach collar on 10l/98% and got ABG. Pt's vitals are stable and pt looks comfortable in no distress. MD aware, RT will monitor.

## 2022-06-06 NOTE — Significant Event (Signed)
Rapid Response Event Note   Reason for Call :  I was called because this patient had a drop in her oxygen saturation to the 70's.   Initial Focused Assessment:  The patient is in no acute distress at the time. She denies shortness of breath. She endorses left lower abdomen pain, worsened when they move her. Patient appears to be anxious about what we were all doing in the room. She says she feels fine. RT suctioned the patient twice and got a small amount of white secretions out.  BP 116/82 HR 106 RR 28 O2 88 on 98% concentration   Interventions:  Trach suctioned x 2 Patient taken off of trach collar and bagged for about a minute and her o2 saturation increased. ABG 7.47, 35, 64, 25.6 Chest xray- Increasing opacity overlying the mid and lower LEFT lung noted which may represent airspace disease, atelectasis and/or effusion. There is no evidence of pneumothorax.  Plan of Care:  Patient to remain on unit. Her sat goal is >85.    Event Summary:   MD Notified: Dr. Alfredia Ferguson Call Time: [place] Time: 1010 End Time: [place], RN

## 2022-06-06 NOTE — Consult Note (Addendum)
Face-to-Face Psychiatry Consult   Reason for Consult:  mood swings Referring Physician:  O. Sheikh  Patient Identification: Monica Hopkins MRN:  [id number] Principal Diagnosis: Cardiac arrest [facility]) Diagnosis:  Principal Problem:   Cardiac arrest ([place]) Active Problems:   Pressure injury of skin   Protein-calorie malnutrition, severe   Pneumonia of both lungs due to infectious organism   Acute respiratory failure (HCC)   Total Time spent with patient: 30 minutes  Subjective:   Patient is a 51 year old Caucasian female with a past medical history significant for but limited to bipolar disorder who after cardiac arrest.  Per her family she had been feeling sick and taking trazodone and Benadryl at night.  They thought she had COVID a couple weeks ago and then recovered somewhat.  The last few weeks she has been short of breath intermittently with a mild cough fevers and sore throat.  Her husband came home she is alert and talking and then he went about a minute about 10 minutes later he got back to find her unconscious.  He started CPR immediately and EMS came fairly quickly.  She is reportedly found in PEA arrest and was given 1 mg of epi as well as lidocaine.  Then subsequently she is found wants to be in a shockable rhythm and was shocked and received ROSC after about 26 minutes while in route.  Per the family she is gone under a large weight loss a few years ago with unclear cause and has felt it was from the lithium but is unclear when she last taken this.  She has had poor p.o. intake from low appetite nausea vomiting and she apparently not wanted to go to the physician and had only recently done a outpatient follow-up with psychiatry.     HPI:   I communicated patient via verbal communication and also written communication via dry erase board. Offered to call family for collateral, but the pt declined.  Pt states that she has changes in her mood daily, feeling happy when her family visits  but sad and tearful when her family leaves. She becomes tearful when talking about her family visiting and leaving her. She reports having a  h/o BP d/o for many years, previously on lithium, which she attributes to her medical problems and decline. She reports 1 previous psych hosp. Reports anxiety is up and down, coinciding with changes in mood. Reports taking clonazepam, depakote, and trazodone - she communicates that she wants these medications ordered, and specifically asks for gabapentin and seroquel to be dc. She has capacity to make this decision.  She denies SI, HI, psychotic symptoms. Reports no manic symptoms.      Past Psychiatric History:  She reports having a  h/o BP d/o for many years, previously on lithium, which she attributes to her medical problems and decline. She reports 1 previous psych hosp. Reports home psych meds are: clonazepam, depakote, and trazodone.   Risk to Self:  denies  Risk to Others:  denies  Prior Inpatient Therapy:  once  Prior Outpatient Therapy:  yes has outpatient psychiatrist   Past Medical History:  Past Medical History:  Diagnosis Date   Prolapse, disk     Past Surgical History:  Procedure Laterality Date   NECK SURGERY     Family History:  Family History  Problem Relation Age of Onset   Mental illness Mother    Mental illness Sister    Family Psychiatric  History: did not report any  Social History:  Social History   Substance and Sexual Activity  Alcohol Use No     Social History   Substance and Sexual Activity  Drug Use No    Social History   Socioeconomic History   Marital status: Legally Separated    Spouse name: Not on file   Number of children: Not on file   Years of education: Not on file   Highest education level: Not on file  Occupational History   Not on file  Tobacco Use   Smoking status: Every Day    Packs/day: 1.00    Years: 9.00    Total pack years: 9.00    Types: Cigarettes   Smokeless tobacco: Never    Tobacco comments:    still trying to cut back and quit on her own.   Vaping Use   Vaping Use: Never used  Substance and Sexual Activity   Alcohol use: No   Drug use: No   Sexual activity: Not Currently    Partners: Male  Other Topics Concern   Not on file  Social History Narrative   Not on file   Social Determinants of Health   Financial Resource Strain: Not on file  Food Insecurity: Not on file  Transportation Needs: Not on file  Physical Activity: Not on file  Stress: Not on file  Social Connections: Not on file   Additional Social History:    Allergies:   Allergies  Allergen Reactions   Abilify [Aripiprazole] Swelling and Rash   Lamictal [Lamotrigine] Swelling and Rash   Latuda [Lurasidone] Other (See Comments)    Caused leg weakness    Labs:  Results for orders placed or performed during the hospital encounter of 05/19/22 (from the past 48 hour(s))  Glucose, capillary     Status: None   Collection Time: 06/04/22  4:05 PM  Result Value Ref Range   Glucose-Capillary 87 70 - 99 mg/dL    Comment: Glucose reference range applies only to samples taken after fasting for at least 8 hours.  Glucose, capillary     Status: Abnormal   Collection Time: 06/04/22  8:08 PM  Result Value Ref Range   Glucose-Capillary 103 (H) 70 - 99 mg/dL    Comment: Glucose reference range applies only to samples taken after fasting for at least 8 hours.   Comment 1 Notify RN    Comment 2 Document in Chart   Glucose, capillary     Status: None   Collection Time: 06/05/22 12:08 AM  Result Value Ref Range   Glucose-Capillary 90 70 - 99 mg/dL    Comment: Glucose reference range applies only to samples taken after fasting for at least 8 hours.   Comment 1 Notify RN    Comment 2 Document in Chart   CBC with Differential/Platelet     Status: Abnormal   Collection Time: 06/05/22  1:06 AM  Result Value Ref Range   WBC 8.8 4.0 - 10.5 K/uL   RBC 2.26 (L) 3.87 - 5.11 MIL/uL   Hemoglobin 7.1  (L) 12.0 - 15.0 g/dL    Comment: REPEATED TO VERIFY   HCT 22.2 (L) 36.0 - 46.0 %   MCV 98.2 80.0 - 100.0 fL   MCH 31.4 26.0 - 34.0 pg   MCHC 32.0 30.0 - 36.0 g/dL   RDW 15.8 (H) 11.5 - 15.5 %   Platelets 467 (H) 150 - 400 K/uL   nRBC 0.0 0.0 - 0.2 %   Neutrophils Relative % 65 %  Neutro Abs 5.8 1.7 - 7.7 K/uL   Lymphocytes Relative 24 %   Lymphs Abs 2.1 0.7 - 4.0 K/uL   Monocytes Relative 6 %   Monocytes Absolute 0.5 0.1 - 1.0 K/uL   Eosinophils Relative 3 %   Eosinophils Absolute 0.3 0.0 - 0.5 K/uL   Basophils Relative 1 %   Basophils Absolute 0.1 0.0 - 0.1 K/uL   Immature Granulocytes 1 %   Abs Immature Granulocytes 0.08 (H) 0.00 - 0.07 K/uL    Comment: Performed at [facility] [address], [place], [place] [address]  Comprehensive metabolic panel     Status: Abnormal   Collection Time: 06/05/22  1:06 AM  Result Value Ref Range   Sodium 141 135 - 145 mmol/L   Potassium 3.5 3.5 - 5.1 mmol/L   Chloride 107 98 - 111 mmol/L   CO2 25 22 - 32 mmol/L   Glucose, Bld 107 (H) 70 - 99 mg/dL    Comment: Glucose reference range applies only to samples taken after fasting for at least 8 hours.   BUN 69 (H) 6 - 20 mg/dL   Creatinine, Ser 1.15 (H) 0.44 - 1.00 mg/dL   Calcium 9.0 8.9 - 10.3 mg/dL   Total Protein 4.8 (L) 6.5 - 8.1 g/dL   Albumin 1.8 (L) 3.5 - 5.0 g/dL   AST 20 15 - 41 U/L   ALT 21 0 - 44 U/L   Alkaline Phosphatase 57 38 - 126 U/L   Total Bilirubin 0.4 0.3 - 1.2 mg/dL   GFR, Estimated 58 (L) >60 mL/min    Comment: (NOTE) Calculated using the CKD-EPI Creatinine Equation (2021)    Anion gap 9 5 - 15    Comment: Performed at [facility], [place] [address] [place], [place] [address]  Phosphorus     Status: None   Collection Time: 06/05/22  1:06 AM  Result Value Ref Range   Phosphorus 3.9 2.5 - 4.6 mg/dL    Comment: Performed at [facility] [address], [place], [place] [address]  Magnesium     Status: None   Collection Time: 06/05/22  1:06  AM  Result Value Ref Range   Magnesium 1.8 1.7 - 2.4 mg/dL    Comment: Performed at [facility] [address] [place], [place] [address]  Vitamin B12     Status: Abnormal   Collection Time: 06/05/22  1:06 AM  Result Value Ref Range   Vitamin B-12 1,088 (H) 180 - 914 pg/mL    Comment: (NOTE) This assay is not validated for testing neonatal or myeloproliferative syndrome specimens for Vitamin B12 levels. Performed at [facility], [place] [address], [place], [place] [address]   Folate     Status: None   Collection Time: 06/05/22  1:06 AM  Result Value Ref Range   Folate 17.3 >5.9 ng/mL    Comment: Performed at [facility], [place] [address], [place], [place] [address]  Iron and TIBC     Status: Abnormal   Collection Time: 06/05/22  1:06 AM  Result Value Ref Range   Iron 61 28 - 170 ug/dL   TIBC 154 (L) 250 - 450 ug/dL   Saturation Ratios 40 (H) 10.4 - 31.8 %   UIBC 93 ug/dL    Comment: Performed at [facility], [place] [address], [place], [place] [address]  Ferritin     Status: Abnormal   Collection Time: 06/05/22  1:06 AM  Result Value Ref Range   Ferritin 415 (H) 11 - 307 ng/mL    Comment: Performed at [facility], [place] [address] [place], [place] [address]  Reticulocytes     Status: Abnormal   Collection Time: 06/05/22  1:06 AM  Result Value Ref Range   Retic Ct Pct 2.4 0.4 - 3.1 %   RBC. 2.15 (L) 3.87 - 5.11 MIL/uL   Retic Count, Absolute 51.6 19.0 - 186.0 K/uL   Immature Retic Fract 13.6 2.3 - 15.9 %    Comment: Performed at [facility] [address] [place], [place] [address]  Glucose, capillary     Status: Abnormal   Collection Time: 06/05/22  4:03 AM  Result Value Ref Range   Glucose-Capillary 111 (H) 70 - 99 mg/dL    Comment: Glucose reference range applies only to samples taken after fasting for at least 8 hours.   Comment 1 Notify RN    Comment 2 Document in Chart   Glucose, capillary     Status: None   Collection Time:  06/05/22  7:57 AM  Result Value Ref Range   Glucose-Capillary 75 70 - 99 mg/dL    Comment: Glucose reference range applies only to samples taken after fasting for at least 8 hours.  Glucose, capillary     Status: None   Collection Time: 06/05/22 11:38 AM  Result Value Ref Range   Glucose-Capillary 99 70 - 99 mg/dL    Comment: Glucose reference range applies only to samples taken after fasting for at least 8 hours.  Glucose, capillary     Status: None   Collection Time: 06/05/22  3:48 PM  Result Value Ref Range   Glucose-Capillary 87 70 - 99 mg/dL    Comment: Glucose reference range applies only to samples taken after fasting for at least 8 hours.  Glucose, capillary     Status: Abnormal   Collection Time: 06/05/22  7:52 PM  Result Value Ref Range   Glucose-Capillary 107 (H) 70 - 99 mg/dL    Comment: Glucose reference range applies only to samples taken after fasting for at least 8 hours.   Comment 1 Notify RN    Comment 2 Document in Chart   Glucose, capillary     Status: Abnormal   Collection Time: 06/06/22 12:10 AM  Result Value Ref Range   Glucose-Capillary 114 (H) 70 - 99 mg/dL    Comment: Glucose reference range applies only to samples taken after fasting for at least 8 hours.   Comment 1 Notify RN    Comment 2 Document in Chart   Glucose, capillary     Status: None   Collection Time: 06/06/22  3:12 AM  Result Value Ref Range   Glucose-Capillary 76 70 - 99 mg/dL    Comment: Glucose reference range applies only to samples taken after fasting for at least 8 hours.   Comment 1 Notify RN    Comment 2 Document in Chart   Glucose, capillary     Status: Abnormal   Collection Time: 06/06/22  8:51 AM  Result Value Ref Range   Glucose-Capillary 100 (H) 70 - 99 mg/dL    Comment: Glucose reference range applies only to samples taken after fasting for at least 8 hours.  CBC with Differential/Platelet     Status: Abnormal   Collection Time: 06/06/22 10:19 AM  Result Value Ref Range    WBC 10.3 4.0 - 10.5 K/uL   RBC 2.51 (  L) 3.87 - 5.11 MIL/uL   Hemoglobin 7.7 (L) 12.0 - 15.0 g/dL   HCT 24.0 (L) 36.0 - 46.0 %   MCV 95.6 80.0 - 100.0 fL   MCH 30.7 26.0 - 34.0 pg   MCHC 32.1 30.0 - 36.0 g/dL   RDW 15.8 (H) 11.5 - 15.5 %   Platelets 501 (H) 150 - 400 K/uL   nRBC 0.0 0.0 - 0.2 %   Neutrophils Relative % 71 %   Neutro Abs 7.4 1.7 - 7.7 K/uL   Lymphocytes Relative 19 %   Lymphs Abs 2.0 0.7 - 4.0 K/uL   Monocytes Relative 5 %   Monocytes Absolute 0.5 0.1 - 1.0 K/uL   Eosinophils Relative 3 %   Eosinophils Absolute 0.3 0.0 - 0.5 K/uL   Basophils Relative 1 %   Basophils Absolute 0.1 0.0 - 0.1 K/uL   Immature Granulocytes 1 %   Abs Immature Granulocytes 0.07 0.00 - 0.07 K/uL    Comment: Performed at [facility] [address] [place], [place] [address]  Comprehensive metabolic panel     Status: Abnormal   Collection Time: 06/06/22 10:19 AM  Result Value Ref Range   Sodium 136 135 - 145 mmol/L   Potassium 3.7 3.5 - 5.1 mmol/L   Chloride 106 98 - 111 mmol/L   CO2 21 (L) 22 - 32 mmol/L   Glucose, Bld 137 (H) 70 - 99 mg/dL    Comment: Glucose reference range applies only to samples taken after fasting for at least 8 hours.   BUN 58 (H) 6 - 20 mg/dL   Creatinine, Ser 1.19 (H) 0.44 - 1.00 mg/dL   Calcium 8.7 (L) 8.9 - 10.3 mg/dL   Total Protein 5.5 (L) 6.5 - 8.1 g/dL   Albumin 2.1 (L) 3.5 - 5.0 g/dL   AST 31 15 - 41 U/L   ALT 24 0 - 44 U/L   Alkaline Phosphatase 62 38 - 126 U/L   Total Bilirubin 0.3 0.3 - 1.2 mg/dL   GFR, Estimated 55 (L) >60 mL/min    Comment: (NOTE) Calculated using the CKD-EPI Creatinine Equation (2021)    Anion gap 9 5 - 15    Comment: Performed at [facility], [place] [address], [place], [place] [address]  Magnesium     Status: None   Collection Time: 06/06/22 10:19 AM  Result Value Ref Range   Magnesium 1.7 1.7 - 2.4 mg/dL    Comment: Performed at [facility] [address] [place], [place] [address]  Phosphorus      Status: None   Collection Time: 06/06/22 10:19 AM  Result Value Ref Range   Phosphorus 4.1 2.5 - 4.6 mg/dL    Comment: Performed at [facility] [address], [place], [place] [address]  Blood gas, arterial     Status: Abnormal   Collection Time: 06/06/22 10:40 AM  Result Value Ref Range   pH, Arterial 7.47 (H) 7.35 - 7.45   pCO2 arterial 35 32 - 48 mmHg   pO2, Arterial 64 (L) 83 - 108 mmHg   Bicarbonate 25.6 20.0 - 28.0 mmol/L   Acid-Base Excess 2.0 0.0 - 2.0 mmol/L   O2 Saturation 94.9 %   Patient temperature 36.7    Collection site LEFT RADIAL    Drawn by COLLECTED BY RT    Allens test (pass/fail) PASS PASS    Comment: Performed at [facility] [place],  [place] [address]  Glucose, capillary     Status: Abnormal   Collection Time: 06/06/22  1:03 PM  Result Value Ref Range   Glucose-Capillary 105 (H) 70 - 99 mg/dL    Comment: Glucose reference range applies only to samples taken after fasting for at least 8 hours.    Current Facility-Administered Medications  Medication Dose Route Frequency Provider Last Rate Last Admin   0.9 %  sodium chloride infusion  250 mL Intravenous Continuous Collier Bullock, MD       0.9 %  sodium chloride infusion  250 mL Intravenous Continuous Corey Harold, NP   Stopped at 05/27/22 1640   acetaminophen (TYLENOL) tablet 650 mg  650 mg Oral Q4H PRN Collier Bullock, MD   650 mg at 06/04/22 0236   Or   acetaminophen (TYLENOL) 160 MG/5ML solution 650 mg  650 mg Per Tube Q4H PRN Collier Bullock, MD   650 mg at 06/05/22 1815   Or   acetaminophen (TYLENOL) suppository 650 mg  650 mg Rectal Q4H PRN Collier Bullock, MD       acetaminophen (TYLENOL) 160 MG/5ML solution 650 mg  650 mg Per Tube QID Candee Furbish, MD   650 mg at 06/06/22 0926   albuterol (PROVENTIL) (2.5 MG/3ML) 0.083% nebulizer solution 2.5 mg  2.5 mg Nebulization Q4H PRN Kipp Brood, MD       arformoterol (BROVANA) nebulizer solution 15 mcg  15 mcg  Nebulization BID Kipp Brood, MD   15 mcg at 06/05/22 0743   ascorbic acid (VITAMIN C) tablet 500 mg  500 mg Per Tube BID Jacky Kindle, MD   500 mg at 06/06/22 [id number]   Chlorhexidine Gluconate Cloth 2 % PADS 6 each  6 each Topical Daily Julian Hy, DO   6 each at 06/06/22 0926   clonazePAM (KLONOPIN) tablet 0.5 mg  0.5 mg Oral BID PRN Raiford Noble Latif, DO   0.5 mg at 06/06/22 0935   [START ON 06/07/2022] clonazePAM (KLONOPIN) tablet 0.5 mg  0.5 mg Per Tube Daily Shawn Dannenberg, Ovid Curd, MD       clonazePAM Bobbye [place]) tablet 2 mg  2 mg Per Tube QHS Jacky Kindle, MD   2 mg at 06/05/22 2242   docusate (COLACE) 50 MG/5ML liquid 100 mg  100 mg Per Tube BID PRN Ventura Sellers, RPH       famotidine (PEPCID) tablet 20 mg  20 mg Per Tube BID Jacky Kindle, MD   20 mg at 06/06/22 0924   feeding supplement (VITAL AF 1.2 CAL) liquid 1,000 mL  1,000 mL Per Tube Q24H Jacky Kindle, MD   1,000 mL at 06/05/22 1506   fiber supplement (BANATROL TF) liquid 60 mL  60 mL Per Tube BID Jacky Kindle, MD   60 mL at [id number]   folic acid (FOLVITE) tablet 1 mg  1 mg Per Tube Daily Agarwala, Einar Grad, MD   1 mg at 06/06/22 0924   free water 200 mL  200 mL Per Tube Q2H Chand, Sudham, MD   200 mL at 06/06/22 1230   gabapentin (NEURONTIN) tablet 300 mg  300 mg Per Tube BID Candee Furbish, MD   300 mg at 06/06/22 0923   guaiFENesin (ROBITUSSIN) 100 MG/5ML liquid 5 mL  5 mL Per Tube Q4H PRN Raiford Noble Latif, DO       heparin injection 5,000 Units  5,000 Units Subcutaneous Q8H Collier Bullock, MD   5,000 Units at 06/06/22 0559   HYDROmorphone (DILAUDID) injection  0.5 mg  0.5 mg Intravenous Q3H PRN Marguerita Merles Latif, DO   0.5 mg at 06/06/22 0920   HYDROmorphone (DILAUDID) tablet 1 mg  1 mg Per Tube Q6H PRN Cheri Fowler, MD   1 mg at 06/06/22 1110   insulin aspart (novoLOG) injection 0-9 Units  0-9 Units Subcutaneous Q4H Lynnell Catalan, MD   1 Units at 06/03/22 0055   leptospermum manuka honey (MEDIHONEY) paste 1  Application  1 Application Topical Daily [place] Sanes, MD   1 Application at 06/06/22 0926   levalbuterol (XOPENEX) nebulizer solution 0.63 mg  0.63 mg Nebulization TID Marguerita Merles Latif, DO       metoprolol tartrate (LOPRESSOR) tablet 25 mg  25 mg Per Tube Q8H Cheri Fowler, MD   25 mg at 06/06/22 0559   nutrition supplement (JUVEN) (JUVEN) powder packet 1 packet  1 packet Per Tube BID BM Cheri Fowler, MD   1 packet at 06/06/22 0925   ondansetron (ZOFRAN) injection 4 mg  4 mg Intravenous Q6H PRN [place] Sanes, MD       Oral care mouth rinse  15 mL Mouth Rinse 4 times per day Cheri Fowler, MD   15 mL at 06/06/22 1117   Oral care mouth rinse  15 mL Mouth Rinse PRN Cheri Fowler, MD       polyethylene glycol (MIRALAX / GLYCOLAX) packet 17 g  17 g Per Tube Daily PRN [place] Sanes, MD       polyethylene glycol (MIRALAX / GLYCOLAX) packet 17 g  17 g Per Tube Daily [place] Sanes, MD   17 g at 06/05/22 4081   prochlorperazine (COMPAZINE) injection 10 mg  10 mg Intravenous Q6H PRN Marguerita Merles Latif, DO   10 mg at 06/05/22 1412   revefenacin (YUPELRI) nebulizer solution 175 mcg  175 mcg Nebulization Daily Lynnell Catalan, MD   175 mcg at 06/05/22 0743   senna-docusate (Senokot-S) tablet 1 tablet  1 tablet Oral QHS Dow Adolph N, DO   1 tablet at 06/05/22 2152   sodium chloride HYPERTONIC 3 % nebulizer solution 4 mL  4 mL Nebulization BID Simonne Martinet, NP       sulfamethoxazole-trimethoprim (BACTRIM DS) 800-160 MG per tablet 2 tablet  2 tablet Oral Q12H Marguerita Merles [place], DO   2 tablet at 06/06/22 4481   thiamine (VITAMIN B1) tablet 100 mg  100 mg Per Tube Daily Lynnell Catalan, MD   100 mg at 06/06/22 0924   valproic acid (DEPAKENE) 250 MG/5ML solution 250 mg  250 mg Per Tube BID Lidia Collum, PA-C   250 mg at 06/06/22 8563   vitamin A capsule 10,000 Units  10,000 Units Oral Daily Lorin Glass, MD   10,000 Units at 06/06/22 1497    Musculoskeletal: Strength & Muscle Tone:  Laying in bed   Gait & Station: Laying in bed   Patient leans: Laying in bed        Psychiatric Specialty Exam:  Presentation  General Appearance:  Appropriate for Environment  Eye Contact: Good  Speech: -- (difficult, mouths words, writes on dry erase board)  Speech Volume:No data recorded Handedness:No data recorded  Mood and Affect  Mood: Depressed  Affect: Tearful; Full Range   Thought Process  Thought Processes: Linear  Descriptions of Associations:Intact  Orientation:No data recorded Thought Content:Logical  Hallucinations:Hallucinations: None  Ideas of Reference:None  Suicidal Thoughts:Suicidal Thoughts: No  Homicidal Thoughts:Homicidal Thoughts: No   Sensorium  Memory: Immediate Good; Recent Good; Remote Good  Judgment: -- (difficult to assess)  Insight: Fair   Materials engineer: Fair  Attention Span: [place] recorded [place] recorded  Psychomotor Activity  Psychomotor Activity: Psychomotor Activity: Normal   Assets  Assets:No data recorded  Sleep  Sleep:No data recorded  Physical Exam: Physical Exam Vitals reviewed.    Review of Systems  Psychiatric/Behavioral:  Positive for depression. Negative for hallucinations and suicidal ideas.    Blood pressure 116/82, pulse (!) 105, temperature 98.1 F (36.7 C), temperature source Oral, resp. rate (!) 29, height 5\' 6"  (1.676 m), weight 56.3 kg, SpO2 90 %. Body mass index is 20.03 kg/m.  Treatment Plan Summary:  Assessment: -Bipolar disorder  -Adjustment d/o with anxious features   Plan: -pt does not require inpt psych admission or 1:1 sitter -Pt has capacity to make decisions regarding psychiatric medication management  -At pt request, discontinue seroquel -Start seroquel 50 mg tid prn for agitation only  -Pt requests dc gabapentin, but I'm not sure if this is a psych med for for another  indication, so will let the primary team make decision on this -Start clonazepam 0.5 mg in the morning at 8am -Continue clonazepam 2 mg qhs and 0.5 mg bid prn  -continue depakote 250 mg bid  - I offered to titrate this but pt declined and wants to continue the current dose at this time -start trazodone 50 mg qhs prn     Disposition: No evidence of imminent risk to self or others at present.   Patient does not meet criteria for psychiatric inpatient admission. Supportive therapy provided about ongoing stressors. Discussed crisis plan, support from social network, calling 911, coming to the Emergency Department, and calling Suicide Hotline.  Psych consult service will sign-off.   Christoper Allegra, MD 06/06/2022 1:37 PM  Total Time Spent in Direct Patient Care:  I personally spent 45 minutes on the unit in direct patient care. The direct patient care time included face-to-face time with the patient, reviewing the patient's chart, communicating with other professionals, and coordinating care. Greater than 50% of this time was spent in counseling or coordinating care with the patient regarding goals of hospitalization, psycho-education, and discharge planning needs.   Janine Limbo, MD Psychiatrist

## 2022-06-06 NOTE — Progress Notes (Signed)
PROGRESS NOTE    Monica Hopkins  [id number] DOB: 1971-01-06 DOA: 05/19/2022 PCP: Leonard Downing, MD   Brief Narrative:  Patient is a 51 year old Caucasian female with a past medical history significant for but limited to bipolar disorder who after cardiac arrest.  Per her family she had been feeling sick and taking trazodone and Benadryl at night.  They thought she had COVID a couple weeks ago and then recovered somewhat.  The last few weeks she has been short of breath intermittently with a mild cough fevers and sore throat.  Her husband came home she is alert and talking and then he went about a minute about 10 minutes later he got back to find her unconscious.  He started CPR immediately and EMS came fairly quickly.  She is reportedly found in PEA arrest and was given 1 mg of epi as well as lidocaine.  Then subsequently she is found wants to be in a shockable rhythm and was shocked and received ROSC after about 26 minutes while in route.  Per the family she is gone under a large weight loss a few years ago with unclear cause and has felt it was from the lithium but is unclear when she last taken this.  She has had poor p.o. intake from low appetite nausea vomiting and she apparently not wanted to go to the physician and had only recently done a outpatient follow-up with psychiatry.  Normally she sleeps in recliner.  In the ED when she was born and she is intubated and was hypothermic.  ABG was 7.206 with a PCO2 49 and O2 level of 82.  She was given IV vancomycin and cefepime and was noted to have AKI.  Home medications include Klonopin and trazodone.  Currently she was admitted and significant hospital events included   Significant Hospital Events: Including procedures, antibiotic start and stop dates in addition to other pertinent events   10/17: admitted post cardiac arrest; ROSC 26 minutes 10/20: off pressors, will awaken agitated, desaturates SBT.  10/23 failed extubation trial 10/25  trach 06/04/2022 she was transferred to the [facility] service 06/04/22 she desaturated on trach collar and PCCM was reconsulted. 06/05/22.  Continues to fail at swallowing and will need alternate means of feeding and she is resistant to PEG tube.  Consulted palliative care for further goals of care discussion and will also need a psychiatric evaluation 06/06/22.  Apparently desaturated again rapid response was called on her again.  Pulmonary was reevaluating and recommending more aggressive pulmonary toileting and thinks that her desaturations were from mucous plugging.  They are recommending attempting ramping up measures with direct airway clearance as detailed and requiring a trial of chest vest as well as bronchodilators and antibiotics.  They are recommending repeating a chest x-ray Monday and if she is failing to clear up the consolidation and recommending obtaining repeat chest CT to see what extent the left opacity may be plugging versus consolidation versus pleural effusion.  Pulmonary recommends continuing supplemental oxygen for saturation greater than 88%.  Psychiatry was consulted and they are recommending discontinuing Seroquel and starting Seroquel 50 mg 3 times daily as needed for agitation only and started clonazepam 0.5 mg in the morning as well as continuing Depakote and starting trazodone.  Patient is now amenable to gastrostomy tube placement if necessary and we will likely pursue this this week if she continues to fail she needs an alternate way of continued nutrition  Assessment and Plan:  S/p cardiac arrest: found to  be in PEA/shockable rhythm, in the setting of hypoxia due to pneumococcal pneumonia  Acute hypoxic respiratory failure due to Strep pneumonia pneumonia, complicated with aspiration and healthcare associated pneumonia with Enterobacter, Klebsiella and stenotrophomonas now s/p trach -Patient tolerated trach collar for 24 hours, intermittently desats due to agitation and  restlessness but oxygen saturation improves when she is calm and relaxed: Desaturated significantly to 70% the day before yesterday for about half an hour so PCCM was reconsulted and subsequently she improved with suctioning.  She did the same thing again today and pulmonary consulted and they feel that she may have some mucous plugging hours following some aggressive pulmonary toileting and chest vest -We will repeat the chest x-ray on Monday continue bronchodilators -Meropenem changed to Bactrim given improvement in renal function -Continue trach collar as long as patient can tolerate; PCCM will monitor weekly -Chest x-ray today showed "Progression of right lower lobe airspace disease. Progressive consolidation left lower lobe with overall decreased size of infiltrate." -ABG    Component Value Date/Time   PHART 7.47 (H) 06/06/2022 1040   PCO2ART 35 06/06/2022 1040   PO2ART 64 (L) 06/06/2022 1040   HCO3 25.6 06/06/2022 1040   TCO2 28 06/03/2022 1139   ACIDBASEDEF 2.0 05/20/2022 0243   O2SAT 94.9 06/06/2022 1040    -SLP working with Passy-Muir valve -Continue pulm toileting and added Xopenex in addition to pupillary -SLP recommending continuing n.p.o. and ice chips as needed after oral care; MBS done and still recommending n.p.o. with ice chips to be continued after oral care given that she exhibited since aspiration of all trials including thin liquids, nectar thick liquids and pure aspiration triggered coughing but was ineffective and expelling all of aspirated and aspirated material was expelled with subsequently aspirated again due to poor pharyngeal clearance -SLP feels that she may have acute on chronic dysphagia in the possible setting of recent significant weight loss after use of lithium -PT OT recommending SNF versus AIR if she improves her tolerance -Palliative care consulted for further goals of care discussion   Acute septic/toxic encephalopathy-improved -Mental status has  significantly improved, she is extremely anxious at baseline and has significant emotional lability -Patient is alert and awake and following commands -Avoid deep sedation -Resumed her home Klonopin and she takes 2 mg nightly and now psychiatry is adding 0.5 mg in the morning -Change Dilaudid to as needed and monitor   New diagnosis of acute HFpEF  -Monitor intake and output  -She looks euvolemic -Hold diuretic for now -Chest x-ray done today and showed "Stable cardiomediastinal silhouette. Tracheostomy and feeding tubes are unchanged in position. Stable bibasilar opacities are noted concerning for atelectasis pneumonia. Small left pleural effusion is noted. Bony thorax is unremarkable." -She appears euvolemic and patient is +2.28 L since admission -Continue monitor for signs and symptoms of volume overload   Severe protein calorie malnutrition -Nutrition Status: Nutrition Problem: Severe Malnutrition Etiology: social / environmental circumstances (poor PO intake related to poor dentition after taking lithium) Signs/Symptoms: severe fat depletion, severe muscle depletion Interventions: Refer to RD note for recommendations -MBS done -Per my discussion with dietary today she will likely need a PEG tube and will discuss with the patient and she is is resistant against this currently but now more amenable -Appreciate palliative care goals of care discussion   Acute rhabdomyolysis, resolved Hyperkalemia/Hypokalemia, resolved AKI due to septic ATN, rhabdomyolysis Hypernatremia Serum creatinine continues to improve Monitor intake and output -Avoid further nephrotoxic medications, contrast dyes, hypotension and dehydration ensure  adequate renal perfusion and renally dose medications -Patient's BUNs/creatinine is now 58/1.19 and improved significantly from a few days ago when it was 69/1.47 Serum sodium is trending down and has now normalized to 141 Continue free water flushes now 200  every 2 hours   Anxiety depression bipolar PTA Rib fx from CPR -Continue Klonopin, Depakote and Seroquel -Given her emotional lability and extreme mood swings psychiatry was consulted and made some medication changes and now discontinue the Seroquel and started trazodone and also added morning Klonopin   Acute blood loss anemia on anemia of chronic disease -Patient hemoglobin has stabilized now -Monitor H&H and transfuse if less than 7 -Anemia panel done and showed an iron level of 61, U IBC of 93, TIBC of 154, saturation ratios of 40%, ferritin level of 415, folate of 17.3, vitamin B-12 level 1088 -Hemoglobin/hematocrit is now 7.9/24.4 yesterday and was stable but did drop slightly to 7.1/22.2 and will need to continue monitor and see if she needs to be transfused but it did recover a little bit and is now 7.7/24.0   Thrombocytosis -The patient's platelet count went from 413 and trended up to 546 and is now trending back down to 467 yesterday but today is now 5 1 -Continue monitor and trend and repeat CBC in a.m.   Hypoalbuminemia -The patient albumin level is now gone from 1.9 -> 2.2 -> 1.8 yesterday and today is 2.1 -Continue to monitor trend and repeat CMP in a.m.    DVT prophylaxis: heparin injection 5,000 Units Start: 05/19/22 2200 SCDs Start: 05/19/22 2151    Code Status: Full Code Family Communication: Discussed with family at bedside  Disposition Plan:  Level of care: Progressive Status is: Inpatient Remains inpatient appropriate because: Needs further clinical improvement and addressing her nutritional issues prior to safe discharge disposition   Consultants:  PCCM pulmonary Palliative care medicine  Procedures:  As delineated as above  Antimicrobials:  Anti-infectives (From admission, onward)    Start     Dose/Rate Route Frequency Ordered Stop   06/04/22 1045  sulfamethoxazole-trimethoprim (BACTRIM DS) 800-160 MG per tablet 2 tablet        2 tablet Oral Every  12 hours 06/04/22 0950     06/01/22 1030  meropenem (MERREM) 2 g in sodium chloride 0.9 % 100 mL IVPB  Status:  Discontinued        2 g 280 mL/hr over 30 Minutes Intravenous Every 12 hours 06/01/22 0933 06/04/22 0950   05/27/22 1600  piperacillin-tazobactam (ZOSYN) IVPB 3.375 g  Status:  Discontinued        3.375 g 12.5 mL/hr over 240 Minutes Intravenous Every 8 hours 05/27/22 1018 06/01/22 0930   05/27/22 1130  vancomycin (VANCOREADY) IVPB 1250 mg/250 mL  Status:  Discontinued        1,250 mg 166.7 mL/hr over 90 Minutes Intravenous Every 24 hours 05/27/22 1036 05/27/22 1037   05/27/22 1130  vancomycin (VANCOREADY) IVPB 1250 mg/250 mL  Status:  Discontinued        1,250 mg 166.7 mL/hr over 90 Minutes Intravenous Every 48 hours 05/27/22 1037 05/28/22 0832   05/27/22 1115  vancomycin (VANCOCIN) IVPB 1000 mg/200 mL premix  Status:  Discontinued        1,000 mg 200 mL/hr over 60 Minutes Intravenous  Once 05/27/22 1018 05/27/22 1036   05/27/22 1115  piperacillin-tazobactam (ZOSYN) IVPB 3.375 g        3.375 g 100 mL/hr over 30 Minutes Intravenous  Once 05/27/22 1018 05/27/22  1119   05/20/22 2200  vancomycin (VANCOREADY) IVPB 750 mg/150 mL  Status:  Discontinued        750 mg 150 mL/hr over 60 Minutes Intravenous Every 24 hours 05/19/22 2158 05/20/22 1017   05/20/22 2000  vancomycin (VANCOCIN) IVPB 1000 mg/200 mL premix  Status:  Discontinued        1,000 mg 200 mL/hr over 60 Minutes Intravenous Every 24 hours 05/19/22 2107 05/19/22 2158   05/20/22 1115  cefTRIAXone (ROCEPHIN) 2 g in sodium chloride 0.9 % 100 mL IVPB        2 g 200 mL/hr over 30 Minutes Intravenous Every 24 hours 05/20/22 1017 05/25/22 1042   05/20/22 1000  ceFEPIme (MAXIPIME) 2 g in sodium chloride 0.9 % 100 mL IVPB  Status:  Discontinued        2 g 200 mL/hr over 30 Minutes Intravenous Every 12 hours 05/19/22 2107 05/20/22 1017   05/19/22 2200  vancomycin (VANCOREADY) IVPB 1250 mg/250 mL        1,250 mg 166.7 mL/hr over  90 Minutes Intravenous  Once 05/19/22 2158 05/20/22 0116   05/19/22 2100  ceFEPIme (MAXIPIME) 2 g in sodium chloride 0.9 % 100 mL IVPB        2 g 200 mL/hr over 30 Minutes Intravenous  Once 05/19/22 2048 05/19/22 2208   05/19/22 2100  vancomycin (VANCOREADY) IVPB 1500 mg/300 mL  Status:  Discontinued        1,500 mg 150 mL/hr over 120 Minutes Intravenous  Once 05/19/22 2048 05/19/22 2158        Subjective: Seen and examined this morning after she had another rapid response called on her and she desaturated.  Had to have deep suctioning and stabilized and was placed on 98% FiO2 on a trach collar.  Pulmonary feels that this was secondary to mucous plugging and having made some adjustments.  She continues to complain of some shoulder and rib pain and this is likely in setting of her CPR.  She denies any lightheadedness or dizziness and per discussion with daughter she is more agreeable to PEG tube  Objective: Vitals:   06/06/22 1304 06/06/22 1347 06/06/22 1356 06/06/22 1357  BP:    114/77  Pulse:   (!) 105 (!) 103  Resp: (!) 23 (!) 24    Temp:      TempSrc:      SpO2:    90%  Weight:      Height:        Intake/Output Summary (Last 24 hours) at 06/06/2022 1748 Last data filed at 06/06/2022 0028 Gross per 24 hour  Intake --  Output 0 ml  Net 0 ml   Filed Weights   05/31/22 0600 06/02/22 0335 06/03/22 0500  Weight: 57.2 kg 55.2 kg 56.3 kg   Examination: Physical Exam:  Constitutional: Thin cachectic chronically ill-appearing Caucasian female who is anxious  Respiratory: Diminished to auscultation bilaterally with coarse breath sounds and she is wearing a trach collar and has a tracheostomy.  Has increased respiratory effort and continues to desaturate some. Cardiovascular: Tachycardic rate but regular rhythm, no murmurs / rubs / gallops. S1 and S2 auscultated. No extremity edema.   Abdomen: Soft, non-tender, non-distended.  Bowel sounds positive.  GU: Deferred. Musculoskeletal:  No clubbing / cyanosis of digits/nails. No joint deformity upper and lower extremities.  Skin: No rashes, lesions, ulcers limited skin evaluation. No induration; Warm and dry.  Neurologic: CN 2-12 grossly intact with no focal deficits. Romberg sign and  cerebellar reflexes not assessed.  Psychiatric: Normal judgment and insight. Alert and oriented x 3. Normal mood and appropriate affect.   Data Reviewed: I have personally reviewed following labs and imaging studies  CBC: Recent Labs  Lab 06/02/22 0343 06/02/22 1316 06/03/22 0439 06/03/22 1139 06/04/22 1109 06/05/22 0106 06/06/22 1019  WBC 19.3*  --  9.2  --  10.9* 8.8 10.3  NEUTROABS 17.0*  --   --   --  7.9* 5.8 7.4  HGB 7.1*   < > 7.3* 7.8* 7.9* 7.1* 7.7*  HCT 22.2*   < > 23.5* 23.0* 24.4* 22.2* 24.0*  MCV 99.1  --  100.9*  --  96.4 98.2 95.6  PLT 413*  --  451*  --  546* 467* 501*   < > = values in this interval not displayed.   Basic Metabolic Panel: Recent Labs  Lab 05/31/22 0538 06/01/22 0215 06/02/22 0343 06/02/22 1316 06/03/22 0439 06/03/22 1139 06/04/22 0857 06/05/22 0106 06/06/22 1019  NA 148*   < > 151*   < > 148* 147* 144 141 136  K 3.7   < > 3.5   < > 3.8 3.8 3.7 3.5 3.7  CL 107  --  114*  --  112*  --  108 107 106  CO2 28  --  28  --  27  --  25 25 21*  GLUCOSE 120*  --  115*  --  94  --  101* 107* 137*  BUN 75*  --  69*  --  61*  --  62* 69* 58*  CREATININE 1.77*  --  1.47*  --  1.17*  --  1.05* 1.15* 1.19*  CALCIUM 9.8  --  9.8  --  9.5  --  9.8 9.0 8.7*  MG 1.9  --  2.2  --   --   --  1.9 1.8 1.7  PHOS  --   --  4.1  --   --   --  3.6 3.9 4.1   < > = values in this interval not displayed.   GFR: Estimated Creatinine Clearance: 49.7 mL/min (A) (by C-G formula based on SCr of 1.19 mg/dL (H)). Liver Function Tests: Recent Labs  Lab 06/02/22 0343 06/04/22 0857 06/05/22 0106 06/06/22 1019  AST 21 26 20 31   ALT 23 25 21 24   ALKPHOS 61 63 57 62  BILITOT 0.5 0.4 0.4 0.3  PROT 5.9* 6.1* 4.8* 5.5*   ALBUMIN 1.9* 2.2* 1.8* 2.1*   No results for input(s): "LIPASE", "AMYLASE" in the last 168 hours. No results for input(s): "AMMONIA" in the last 168 hours. Coagulation Profile: No results for input(s): "INR", "PROTIME" in the last 168 hours. Cardiac Enzymes: No results for input(s): "CKTOTAL", "CKMB", "CKMBINDEX", "TROPONINI" in the last 168 hours. BNP (last 3 results) No results for input(s): "PROBNP" in the last 8760 hours. HbA1C: No results for input(s): "HGBA1C" in the last 72 hours. CBG: Recent Labs  Lab 06/05/22 1952 06/06/22 0010 06/06/22 0312 06/06/22 0851 06/06/22 1303  GLUCAP 107* 114* 76 100* 105*   Lipid Profile: No results for input(s): "CHOL", "HDL", "LDLCALC", "TRIG", "CHOLHDL", "LDLDIRECT" in the last 72 hours. Thyroid Function Tests: No results for input(s): "TSH", "T4TOTAL", "FREET4", "T3FREE", "THYROIDAB" in the last 72 hours. Anemia Panel: Recent Labs    06/05/22 0106  VITAMINB12 1,088*  FOLATE 17.3  FERRITIN 415*  TIBC 154*  IRON 61  RETICCTPCT 2.4   Sepsis Labs: No results for input(s): "PROCALCITON", "LATICACIDVEN" in the  last 168 hours.  Recent Results (from the past 240 hour(s))  Culture, Respiratory w Gram Stain     Status: None   Collection Time: 05/30/22  9:23 AM   Specimen: Tracheal Aspirate; Respiratory  Result Value Ref Range Status   Specimen Description TRACHEAL ASPIRATE  Final   Special Requests NONE  Final   Gram Stain   Final    MODERATE WBC PRESENT,BOTH PMN AND MONONUCLEAR FEW GRAM NEGATIVE RODS FEW SQUAMOUS EPITHELIAL CELLS PRESENT Performed at [facility], [place] [address] [place], [place] [address]    Culture   Final    FEW ENTEROBACTER CLOACAE FEW STENOTROPHOMONAS MALTOPHILIA    Report Status 06/02/2022 FINAL  Final   Organism ID, Bacteria ENTEROBACTER CLOACAE  Final   Organism ID, Bacteria STENOTROPHOMONAS MALTOPHILIA  Final      Susceptibility   Enterobacter cloacae - MIC*    CEFAZOLIN >=64 RESISTANT  Resistant     CEFEPIME 4 INTERMEDIATE Intermediate     CEFTAZIDIME >=64 RESISTANT Resistant     CIPROFLOXACIN <=0.25 SENSITIVE Sensitive     GENTAMICIN <=1 SENSITIVE Sensitive     IMIPENEM <=0.25 SENSITIVE Sensitive     TRIMETH/SULFA <=20 SENSITIVE Sensitive     PIP/TAZO >=128 RESISTANT Resistant     * FEW ENTEROBACTER CLOACAE   Stenotrophomonas maltophilia - MIC*    LEVOFLOXACIN 0.25 SENSITIVE Sensitive     TRIMETH/SULFA <=20 SENSITIVE Sensitive     * FEW STENOTROPHOMONAS MALTOPHILIA    Radiology Studies: DG CHEST PORT 1 VIEW  Result Date: 06/06/2022 CLINICAL DATA:  Shortness of breath EXAM: PORTABLE CHEST 1 VIEW COMPARISON:  06/05/2022 and prior studies FINDINGS: Tracheostomy tube and small bore feeding tube again noted. Increasing opacity overlying the mid and lower LEFT lung noted which may represent airspace disease, atelectasis and/or effusion. There is no evidence of pneumothorax. RIGHT basilar opacity/atelectasis again noted. No other changes identified. IMPRESSION: Increasing opacity overlying the mid and lower LEFT lung which may represent airspace disease, atelectasis and/or effusion. Unchanged RIGHT basilar opacity/atelectasis. Electronically Signed   By: Margarette Canada M.D.   On: 06/06/2022 11:08   DG Swallowing Func-Speech Pathology  Result Date: 06/05/2022 Table formatting from the original result was not included. Images from the original result were not included. Objective Swallowing Evaluation: Type of Study: MBS-Modified Barium Swallow Study  Patient Details Name: Monica Hopkins MRN: [id number] Date of Birth: [birth year] Today's Date: 06/05/2022 Time: SLP Start Time (ACUTE ONLY): 1440 -SLP Stop Time (ACUTE ONLY): 1500 SLP Time Calculation (min) (ACUTE ONLY): 20 min Past Medical History: Past Medical History: Diagnosis Date  Prolapse, disk  Past Surgical History: Past Surgical History: Procedure Laterality Date  NECK SURGERY   HPI: Pt is a 51 y.o. female admitted 05/19/22 after cardiac  arrest at home, ROSC 26 min, hypothermic. ETT 10/17-10/23; reintubated 10/23-trach10/25. Pt with new HFpEF. MRI brain negative for acute changes. CXR 10/30: Stable multifocal pulmonary infiltrates and small left pleural effusion. PMH: bipolar disorder.  No data recorded  Recommendations for follow up therapy are one component of a multi-disciplinary discharge planning process, led by the attending physician.  Recommendations may be updated based on patient status, additional functional criteria and insurance authorization. Assessment / Plan / Recommendation   06/05/2022   3:40 PM Clinical Impressions Clinical Impression Pt presents with severe pharyngeal dysphagia characterized by reduced tongue base retraction, pharyngeal constriction, hyolaryngeal elevation, and anterior laryngeal movement. She demonstrated vallecular residue, limited epiglottic movement, and reduced airway closure. She exhibited sensed aspiration (PAS 7)  of all trials including thin liquids, nectar thick liquids, and puree. Aspiration triggered coughing, but this was ineffective in expelling all of the aspirate and the aspirated material that was expelled was subsequently aspirated again due to poor pharyngeal clearance. No functional benefit was noted with left and right head turns. A chin tuck posture with effortful swallow did facilitate epiglottic movement to about horizontal position, but full epiglottic inversion was not demonstrated throughout this study despite efforts and this increases pt's risk of aspiration during and after deglutition. The etiology of this severe of a presentation is unclear considering pt's negative MRI, but acute on chronic dysphagia is possible in the setting of recent significant weight loss after use of lithium. It is recommended that the pt's NPO status be maintained, but that ice chips continue to be allowed. SLP will follow for dysphagia treatment. SLP Visit Diagnosis Dysphagia, unspecified (R13.10) Impact on  safety and function Moderate aspiration risk      No data to display        06/05/2022   3:40 PM Prognosis Prognosis for Safe Diet Advancement Fair Barriers to Reach Goals Motivation;Severity of deficits;Behavior Barriers/Prognosis Comment cooperation   06/05/2022   3:40 PM Diet Recommendations SLP Diet Recommendations NPO;Alternative means - temporary;Ice chips PRN after oral care Medication Administration Via alternative means     06/05/2022   3:40 PM Other Recommendations Oral Care Recommendations Oral care QID;Oral care before and after PO Follow Up Recommendations Skilled nursing-short term rehab (<3 hours/day) Assistance recommended at discharge Frequent or constant Supervision/Assistance Functional Status Assessment Patient has had a recent decline in their functional status and demonstrates the ability to make significant improvements in function in a reasonable and predictable amount of time.   06/05/2022   3:40 PM Frequency and Duration  Speech Therapy Frequency (ACUTE ONLY) min 2x/week Treatment Duration 2 weeks     06/05/2022   3:40 PM Oral Phase Oral Phase [facility]    06/05/2022   3:40 PM Pharyngeal Phase Pharyngeal Phase Impaired Pharyngeal- Nectar Cup Pharyngeal residue - valleculae;Pharyngeal residue - pyriform;Reduced tongue base retraction;Reduced airway/laryngeal closure;Reduced laryngeal elevation;Reduced anterior laryngeal mobility;Reduced epiglottic inversion;Penetration/Aspiration during swallow;Penetration/Apiration after swallow Pharyngeal Material enters airway, passes BELOW cords and not ejected out despite cough attempt by patient Pharyngeal- Thin Cup Pharyngeal residue - valleculae;Pharyngeal residue - pyriform;Reduced tongue base retraction;Reduced airway/laryngeal closure;Reduced laryngeal elevation;Reduced anterior laryngeal mobility;Reduced epiglottic inversion;Penetration/Aspiration during swallow;Penetration/Apiration after swallow Pharyngeal Material enters airway, passes BELOW cords and not  ejected out despite cough attempt by patient Pharyngeal- Puree Pharyngeal residue - valleculae;Pharyngeal residue - pyriform;Reduced tongue base retraction;Reduced airway/laryngeal closure;Reduced laryngeal elevation;Reduced anterior laryngeal mobility;Reduced epiglottic inversion;Penetration/Aspiration during swallow;Penetration/Apiration after swallow Pharyngeal Material enters airway, passes BELOW cords and not ejected out despite cough attempt by patient    06/05/2022   3:40 PM Cervical Esophageal Phase  Cervical Esophageal Phase Impaired Nectar Cup Reduced cricopharyngeal relaxation Thin Cup Reduced cricopharyngeal relaxation Shanika I. Vear Clock, MS, CCC-SLP Acute Rehabilitation Services Office number [phone] Scheryl Marten 06/05/2022, 4:57 PM                     DG CHEST PORT 1 VIEW  Result Date: 06/05/2022 CLINICAL DATA:  Shortness of breath. EXAM: PORTABLE CHEST 1 VIEW COMPARISON:  June 04, 2022. FINDINGS: Stable cardiomediastinal silhouette. Tracheostomy and feeding tubes are unchanged in position. Stable bibasilar opacities are noted concerning for atelectasis pneumonia. Small left pleural effusion is noted. Bony thorax is unremarkable. IMPRESSION: Stable support apparatus. Stable bibasilar opacities as described above. Electronically  Signed   By: Marijo Conception M.D.   On: 06/05/2022 08:43     Scheduled Meds:  acetaminophen (TYLENOL) oral liquid 160 mg/5 mL  650 mg Per Tube QID   albuterol  2.5 mg Nebulization Q4H   ascorbic acid  500 mg Per Tube BID   Chlorhexidine Gluconate Cloth  6 each Topical Daily   [START ON 06/07/2022] clonazePAM  0.5 mg Per Tube Daily   clonazePAM  2 mg Per Tube QHS   famotidine  20 mg Per Tube BID   feeding supplement (VITAL AF 1.2 CAL)  1,000 mL Per Tube Q24H   fiber supplement (BANATROL TF)  60 mL Per Tube BID   folic acid  1 mg Per Tube Daily   free water  200 mL Per Tube Q2H   gabapentin  300 mg Per Tube BID   heparin  5,000 Units Subcutaneous  Q8H   insulin aspart  0-9 Units Subcutaneous Q4H   leptospermum manuka honey  1 Application Topical Daily   metoprolol tartrate  25 mg Per Tube Q8H   nutrition supplement (JUVEN)  1 packet Per Tube BID BM   mouth rinse  15 mL Mouth Rinse 4 times per day   polyethylene glycol  17 g Per Tube Daily   revefenacin  175 mcg Nebulization Daily   senna-docusate  1 tablet Oral QHS   sodium chloride HYPERTONIC  4 mL Nebulization BID   sulfamethoxazole-trimethoprim  2 tablet Oral Q12H   thiamine  100 mg Per Tube Daily   valproic acid  250 mg Per Tube BID   vitamin A  10,000 Units Oral Daily   Continuous Infusions:  sodium chloride     sodium chloride Stopped (05/27/22 1640)    LOS: 18 days   Raiford Noble, DO [facility] Available via Epic secure chat 7am-7pm After these hours, please refer to coverage provider listed on [email or web address] 06/06/2022, 5:48 PM

## 2022-06-06 NOTE — Progress Notes (Signed)
Pt's son aggressive towards RN because pt couldn't find her passy muir valve. Pt and son were advised that sleep therapist would be notified. Charge Nurse notified.

## 2022-06-06 NOTE — Progress Notes (Signed)
Palliative Medicine Inpatient Follow Up Note   HPI: Patient is a 51 year old Caucasian female with a past medical history significant for but limited to bipolar disorder who after cardiac arrest. Her husband came home she is alert and talking and then he went about a minute about 10 minutes later he got back to find her unconscious.  Palliative care has been asked to get involved for goals of care conversations in the setting of the need for a gastrostomy tube.    Today's Discussion 06/06/2022  *Please note that this is a verbal dictation therefore any spelling or grammatical errors are due to the "[place]" system interpretation.  Chart reviewed inclusive of vital signs, progress notes, laboratory results, and diagnostic images.   I met with Monica Hopkins this morning in the presence of her son.  We discussed her present clinical state given that she has undergone cardiac arrest.  We discussed how she has endured a prolonged and complicated hospital stay.  We reviewed that she shares in the last 2 years she is lost significant weight, her hair has fallen out, and her teeth have decayed all of which she feels is in the setting of having been on lithium for period of time.  She shares that she is no longer on lithium though has not been able to gain weight.  Prior to hospitalization Monica Hopkins that she was fully functional though she has suffered from pain in her right hip.  She expresses that at this point her goals are to maintain functionality again.  We discussed the idea of her going to acute rehabilitation if that were an option she would elect for it.  Discussed patient's complicated psychiatric history and reviewed her anxiety for which is maintained on clonazepam.  She expresses that she has been on this medication for over 20 years and feels that it does do what it supposed to do.  We discussed having psychiatry come by to evaluate her as she had shared her prior psychiatrist  was only doing telephone calls and had not had the opportunity to see her and how much she had declined over the last few years.  From a nutritional standpoint Monica Hopkins and I discussed the idea of a gastrostomy tube in her stomach.  She shares that she is given this a lot of thought and would like to pursue it.  We discussed the risks and benefits.  She shares that she knows the only way she can get stronger is to have adequate nutrition and right now she is not safe to eat and drink.  At this point she does consent to a gastrostomy tube.  A discussion regarding resuscitation status was held and Monica Hopkins shares that she would want full resuscitative efforts made.  She expresses if she was not improving she would want her mom Monica Hopkins to make the decision from there in terms of what to do.  She is not someone who would desire to live in a vegetative state.  Created space and opportunity for patient to explore thoughts feelings and fears regarding current medical situation.  She shares how frightening it is to consider being dependent for the rest of her life and expresses a strong desire to improve her present situation.  Questions and concerns addressed/Palliative Support Provided.   Objective Assessment: Vital Signs Vitals:   06/06/22 1045 06/06/22 1113  BP:    Pulse:    Resp:    Temp:    SpO2: (!) 85% (!) 87%  Intake/Output Summary (Last 24 hours) at 06/06/2022 1121 Last data filed at 06/06/2022 0028 Gross per 24 hour  Intake --  Output 0 ml  Net 0 ml   Last Weight  Most recent update: 06/03/2022  5:57 AM    Weight  56.3 kg (124 lb 1.9 oz)            Gen: Exceptionally frail middle-aged woman Caucasian woman HEENT: Core-track in right nare dry  mucous membranes CV: Regular rate and rhythm PULM: On 10 L tracheostomy collar ABD: soft/nontender EXT: No edema Neuro: Sleeping  SUMMARY OF RECOMMENDATIONS   Full code at this time  Monica Hopkins is presently amenable to gastrostomy tube  placement --> she realizes this is necessary if she wants to optimize her strength moving into the future   Psychiatry may be of help given Monica Hopkins's complicated h/o bipolar disorder --> she shares lithium had an adverse effect on her and she has not been seen in person by a psychiatrist since then also reports that she has been on clonazepam for over 20 years which aids in her acute anxiety  Patient's personal goals are for improvement --> she is open to acute rehabilitation if this was offered   Ongoing palliative care and support  Time Spent: 66 Billing based on MDM: High ______________________________________________________________________________________ [place] Team Team Cell Phone: [phone] Please utilize secure chat with additional questions, if there is no response within 30 minutes please call the above phone number  Palliative Medicine Team providers are available by phone from 7am to 7pm daily and can be reached through the team cell phone.  Should this patient require assistance outside of these hours, please call the patient's attending physician.

## 2022-06-06 NOTE — Progress Notes (Signed)
NAME:  Monica Hopkins, MRN:  [id number], DOB:  1971/04/15, LOS: 13 ADMISSION DATE:  05/19/2022, CONSULTATION DATE:  05/19/22 REFERRING MD:  Billy Fischer, CHIEF COMPLAINT:  found down, cardiac arrest    History of Present Illness:  51 yo woman with hx of bipolar disorder,  here after cardiac arrest.   Per family has been feeling sick and taking trazodone and benadryl at night. They thought she had covid a couple of weeks ago, then recovered somewhat.  For past two weeks has been sob intermittently with some minimal cough, fevers, sore throat.  Her husband came home and she was alert and talking, he went to the bathroom.  10- min later he came back to find her unconscious.  He started CPR immediately, EMS came fairly quickly.  She was reportedly found to be in PEA.  Given 1 mg epi, also lidocaine.  Found once to be in shockable rhythm, shocked once.  Rosc after about 26 min, while in route.   Per family has undergone large weight loss  a few years ago, unclear cause.  Patient had felt it was from lithium but unclear when last takng this..  Poor po intake from low appeitite, nausea and vomiting.  She apparently has not wanted to go to visit the dr.  She recently has only done phone visits with psychiatry.   Sleeps sitting up in recliner.   Per her mother, she has not felt well in years, has complained of some virus or other acute illness affecting her for the past year.   Intubated in ED ( Had king airway in place) at 820 per drug record.  In ED hypothermic  ABG 7.206/49/82 Cefepime and vanc ordered.   AKI cr 1.2 from 0.79 WBC 14.3   Home meds klonipin, trazodone depakote.   Pertinent  Medical History  Bipolar disorder Unexplained weight loss  Poor dentition.  Significant Hospital Events: Including procedures, antibiotic start and stop dates in addition to other pertinent events   10/17: admitted post cardiac arrest; ROSC 26 minutes 10/20: off pressors, will awaken agitated,  desaturates SBT.  10/23 failed extubation trial 10/25 trach  Interim History / Subjective:  Desaturating again.  Asked to come and evaluate at bedside  Objective   Blood pressure (!) 151/75, pulse 95, temperature 98.1 F (36.7 C), temperature source Oral, resp. rate 12, height 5\' 6"  (1.676 m), weight 56.3 kg, SpO2 99 %.    FiO2 (%):  [80 %] 80 %   Intake/Output Summary (Last 24 hours) at 06/04/2022 1000 Last data filed at 06/03/2022 2329 Gross per 24 hour  Intake 470 ml  Output 0 ml  Net 470 ml    Filed Weights   05/31/22 0600 06/02/22 0335 06/03/22 0500  Weight: 57.2 kg 55.2 kg 56.3 kg    Examination: General chronically ill 51 year old female patient she is restless and anxious HEENT tracheostomy is midline normocephalic Pulmonary: Coarse scattered rhonchi evaluation of chest x-ray from today shows Worsening aeration on the left lung reflecting what appears to be mostly mucous plugging Cardiac: Regular rate and rhythm Abdomen: Soft nontender Neuro: Awake oriented anxious  Assessment & Plan:    S/p cardiac arrest: found to be in PEA/shockable rhythm, in the setting of hypoxia due to pneumococcal pneumonia  Acute septic/toxic encephalopathy-improved New diagnosis of acute HFpEF  Severe protein calorie malnutrition Acute rhabdomyolysis, resolved Hyperkalemia/Hypokalemia, resolved AKI due to septic ATN, rhabdomyolysis Hypernatremia Anxiety depression bipolar PTA Rib fx from CPR Acute blood loss anemia on anemia of chronic  disease Acute hypoxic respiratory failure  Strep pneumonia pneumonia,  aspiration and healthcare associated pneumonia with Enterobacter, Klebsiella and stenotrophomonas now s/p trach    Pulm problem list Trach dependent s/p cardiac arrest c/b polymicrobial HCAP and intermittent mucous plugging -She has poor cough mechanics, she is deconditioned, and doing very little with physical therapy and mobility.  Unfortunately I think this is a perfect set  up for mucous plugging  Plan Continue supplemental oxygen wean for saturations greater than 88% Aggressive pulmonary hygiene measures, this will include MetaNeb every 4 hour W/ albuterol Daily Yupelri and HT nebs CXR PRN Cont abx   Best Practice (right click and "Reselect all SmartList Selections" daily)   Per primary  Simonne Martinet ACNP-BC [facility] Pulmonary/Critical Care Pager # [phone] OR # [phone] if no answer

## 2022-06-06 NOTE — Progress Notes (Signed)
Pt SpO2 down to 70%, this RN in room to assess.  Pt appearing weak and listless, however not in acute respiratory distress.  Trach suctioned twice, small amount of white output.  No change in O2 saturations.  Respiratory and rapid response notified and at bedside.  MD paged, order received for state CXR and ABGs.  PCCM consulted.  Primary RN notified and back at bedside at this time.

## 2022-06-07 ENCOUNTER — Inpatient Hospital Stay (HOSPITAL_COMMUNITY): Payer: Medicaid Other

## 2022-06-07 LAB — COMPREHENSIVE METABOLIC PANEL
ALT: 21 U/L (ref 0–44)
AST: 27 U/L (ref 15–41)
Albumin: 2 g/dL — ABNORMAL LOW (ref 3.5–5.0)
Alkaline Phosphatase: 63 U/L (ref 38–126)
Anion gap: 8 (ref 5–15)
BUN: 61 mg/dL — ABNORMAL HIGH (ref 6–20)
CO2: 22 mmol/L (ref 22–32)
Calcium: 8.7 mg/dL — ABNORMAL LOW (ref 8.9–10.3)
Chloride: 104 mmol/L (ref 98–111)
Creatinine, Ser: 1.14 mg/dL — ABNORMAL HIGH (ref 0.44–1.00)
GFR, Estimated: 58 mL/min — ABNORMAL LOW (ref >60)
Glucose, Bld: 103 mg/dL — ABNORMAL HIGH (ref 70–99)
Potassium: 4.2 mmol/L (ref 3.5–5.1)
Sodium: 134 mmol/L — ABNORMAL LOW (ref 135–145)
Total Bilirubin: 0.4 mg/dL (ref 0.3–1.2)
Total Protein: 4.9 g/dL — ABNORMAL LOW (ref 6.5–8.1)

## 2022-06-07 LAB — CBC WITH DIFFERENTIAL/PLATELET
Abs Immature Granulocytes: 0.1 10*3/uL — ABNORMAL HIGH (ref 0.00–0.07)
Basophils Absolute: 0.1 10*3/uL (ref 0.0–0.1)
Basophils Relative: 1 %
Eosinophils Absolute: 0.3 10*3/uL (ref 0.0–0.5)
Eosinophils Relative: 3 %
HCT: 23 % — ABNORMAL LOW (ref 36.0–46.0)
Hemoglobin: 7.4 g/dL — ABNORMAL LOW (ref 12.0–15.0)
Immature Granulocytes: 1 %
Lymphocytes Relative: 20 %
Lymphs Abs: 2.4 10*3/uL (ref 0.7–4.0)
MCH: 32 pg (ref 26.0–34.0)
MCHC: 32.2 g/dL (ref 30.0–36.0)
MCV: 99.6 fL (ref 80.0–100.0)
Monocytes Absolute: 0.8 10*3/uL (ref 0.1–1.0)
Monocytes Relative: 6 %
Neutro Abs: 8.2 10*3/uL — ABNORMAL HIGH (ref 1.7–7.7)
Neutrophils Relative %: 69 %
Platelets: 486 10*3/uL — ABNORMAL HIGH (ref 150–400)
RBC: 2.31 MIL/uL — ABNORMAL LOW (ref 3.87–5.11)
RDW: 16.1 % — ABNORMAL HIGH (ref 11.5–15.5)
WBC: 11.9 10*3/uL — ABNORMAL HIGH (ref 4.0–10.5)
nRBC: 0 % (ref 0.0–0.2)

## 2022-06-07 LAB — GLUCOSE, CAPILLARY
Glucose-Capillary: 110 mg/dL — ABNORMAL HIGH (ref 70–99)
Glucose-Capillary: 103 mg/dL — ABNORMAL HIGH (ref 70–99)
Glucose-Capillary: 87 mg/dL (ref 70–99)
Glucose-Capillary: 86 mg/dL (ref 70–99)
Glucose-Capillary: 95 mg/dL (ref 70–99)
Glucose-Capillary: 95 mg/dL (ref 70–99)
Glucose-Capillary: 108 mg/dL — ABNORMAL HIGH (ref 70–99)

## 2022-06-07 LAB — MAGNESIUM: Magnesium: 1.8 mg/dL (ref 1.7–2.4)

## 2022-06-07 LAB — PHOSPHORUS: Phosphorus: 4.6 mg/dL (ref 2.5–4.6)

## 2022-06-07 MED ORDER — DEXMEDETOMIDINE HCL IN NACL 400 MCG/100ML IV SOLN
0.0000 ug/kg/h | INTRAVENOUS | Status: DC
  Administered 2022-06-07: 0.4 ug/kg/h via INTRAVENOUS
  Filled 2022-06-07 (×2): qty 100

## 2022-06-07 MED ORDER — POLYETHYLENE GLYCOL 3350 17 G PO PACK: 17.0000 g | PACK | Freq: Every day | ORAL | Status: DC

## 2022-06-07 MED ORDER — MAGNESIUM SULFATE 2 GM/50ML IV SOLN
2.0000 g | Freq: Once | INTRAVENOUS | Status: AC
  Administered 2022-06-07: 2 g via INTRAVENOUS
  Filled 2022-06-07: qty 50

## 2022-06-07 MED ORDER — FUROSEMIDE 10 MG/ML IJ SOLN
40.0000 mg | Freq: Once | INTRAMUSCULAR | Status: AC
  Administered 2022-06-07: 40 mg via INTRAVENOUS
  Filled 2022-06-07: qty 4

## 2022-06-07 MED ORDER — DOCUSATE SODIUM 50 MG/5ML PO LIQD
100.0000 mg | Freq: Two times a day (BID) | ORAL | Status: DC
  Administered 2022-06-07 – 2022-06-10 (×4): 100 mg
  Filled 2022-06-07 (×5): qty 10

## 2022-06-07 NOTE — Progress Notes (Signed)
   Palliative Medicine Inpatient Follow Up Note HPI: Patient is a 51 year old Caucasian female with a past medical history significant for but limited to bipolar disorder who after cardiac arrest. Her husband came home she is alert and talking and then he went about a minute about 10 minutes later he got back to find her unconscious.  Palliative care has been asked to get involved for goals of care conversations in the setting of the need for a gastrostomy tube.   Today's Discussion 06/07/2022  *Please note that this is a verbal dictation therefore any spelling or grammatical errors are due to the "[place] One" system interpretation.  Chart reviewed inclusive of vital signs, progress notes, laboratory results, and diagnostic images.   I met with Ammy this morning and noted that her sats were in the 70's with good wave form. She was pointing to her trach. When asked if she felt she could breath she shook her head. I was able to get nursing to come to bedside and I performed deep suctioning x2 while awaiting RT support.   When RT came to bedside deep suction was again performed with a larger cannula. A metaneb was given, and saline was provided via manual bag. Mariene was exceptionally anxious the whole time. A CXR was taken which showed worsening L opacification. Communication with Dr. Alfredia Ferguson who is speaking to the pulm team about a possible bronch.  Macy and I worked with [name] for roughly one hour and after aggressive pulmonary toileting her sats were back into the high 90's.   Patients goals remain for improvement. We did ask if she would be willing to go through a procedure to clear out her mucous which at the time she was in support of.   Questions and concerns addressed/Palliative Support Provided.   Objective Assessment: Vital Signs Vitals:   06/07/22 0338 06/07/22 0838  BP: 102/71   Pulse: 81 (!) 101  Resp: 18 (!) 28  Temp: 97.9 F (36.6 C)   SpO2: 96% 98%   No intake  or output data in the 24 hours ending 06/07/22 1023  Last Weight  Most recent update: 06/07/2022  3:38 AM    Weight  74 kg (163 lb 2.3 oz)            Gen: Exceptionally frail middle-aged woman Caucasian woman HEENT: Core-track in right nare dry  mucous membranes CV: Regular rate and rhythm PULM: On 15L tracheostomy collar ABD: soft/nontender EXT: No edema Neuro: Sleeping  SUMMARY OF RECOMMENDATIONS   Full code at this time  Breanah is presently amenable to gastrostomy tube placement  [place] is amenable to bronchoscopy if needed necessary   Appreciate Psychiatry support  Patient's personal goals are for improvement --> she is open to acute rehabilitation if this was offered   Ongoing palliative care and support  Time Spent: 74 Billing based on MDM: High ______________________________________________________________________________________ [place] Team Team Cell Phone: [phone] Please utilize secure chat with additional questions, if there is no response within 30 minutes please call the above phone number  Palliative Medicine Team providers are available by phone from 7am to 7pm daily and can be reached through the team cell phone.  Should this patient require assistance outside of these hours, please call the patient's attending physician.

## 2022-06-07 NOTE — Progress Notes (Signed)
NAME:  Monica Hopkins, MRN:  [id number], DOB:  Mar 02, 1971, LOS: 29 ADMISSION DATE:  05/19/2022, CONSULTATION DATE:  05/19/22 REFERRING MD:  Billy Fischer, CHIEF COMPLAINT:  found down, cardiac arrest    History of Present Illness:  51 yo woman with hx of bipolar disorder,  here after cardiac arrest.   Per family has been feeling sick and taking trazodone and benadryl at night. They thought she had covid a couple of weeks ago, then recovered somewhat.  For past two weeks has been sob intermittently with some minimal cough, fevers, sore throat.  Her husband came home and she was alert and talking, he went to the bathroom.  10- min later he came back to find her unconscious.  He started CPR immediately, EMS came fairly quickly.  She was reportedly found to be in PEA.  Given 1 mg epi, also lidocaine.  Found once to be in shockable rhythm, shocked once.  Rosc after about 26 min, while in route.   Per family has undergone large weight loss  a few years ago, unclear cause.  Patient had felt it was from lithium but unclear when last takng this..  Poor po intake from low appeitite, nausea and vomiting.  She apparently has not wanted to go to visit the dr.  She recently has only done phone visits with psychiatry.   Sleeps sitting up in recliner.   Per her mother, she has not felt well in years, has complained of some virus or other acute illness affecting her for the past year.   Intubated in ED ( Had king airway in place) at 820 per drug record.  In ED hypothermic  ABG 7.206/49/82 Cefepime and vanc ordered.   AKI cr 1.2 from 0.79 WBC 14.3   Home meds klonipin, trazodone depakote.   Pertinent  Medical History  Bipolar disorder Unexplained weight loss  Poor dentition.  Significant Hospital Events: Including procedures, antibiotic start and stop dates in addition to other pertinent events   10/17: admitted post cardiac arrest; ROSC 26 minutes 10/20: off pressors, will awaken agitated,  desaturates SBT.  10/23 failed extubation trial 10/25 trach 11/4 desaturation in setting L mucus plugging and small effusion. Has been refusing CPT.  Interim History / Subjective:  Desaturation again this morning, refusing chest vest, metaneb. Did metaneb after some insistence this morning with RT, bagged and lavaged and CXR, saturation improved.   Objective   Blood pressure 102/71, pulse (!) 101, temperature 97.9 F (36.6 C), temperature source Axillary, resp. rate (!) 28, height 5\' 6"  (1.676 m), weight 74 kg, SpO2 98 %.    FiO2 (%):  [80 %-98 %] 98 %  No intake or output data in the 24 hours ending 06/07/22 1039 Filed Weights   06/02/22 0335 06/03/22 0500 06/07/22 0338  Weight: 55.2 kg 56.3 kg 74 kg    Examination: General appearance: 51 y.o., female, chronically ill appearing Neck: 6 shiley cuffed in place Lungs: rhonchorous, with normal respiratory effort CV: tachy RR, no murmur  Abdomen: Soft, non-tender; non-distended, BS present  Extremities: No peripheral edema, warm Psych: Anxious Neuro: follows commands, moves all ext equally, weak  Labs/imaging reviewed Na down to 134 S Cr 1.14, low body mass Albumin 2 WBC 12   CXR with improved aeration after CPT, residual left effusion   Assessment & Plan:   Acute hypoxic respiratory failure Trach dependent s/p cardiac arrest c/b polymicrobial HCAP, most recently stenotrophomonas/enterobacter and intermittent mucous plugging S/p cardiac arrest Acute septic/toxic encephalopathy-improved New diagnosis of  acute HFpEF  Severe protein calorie malnutrition Acute rhabdomyolysis, resolved Hyperkalemia/Hypokalemia, resolved AKI due to septic ATN, rhabdomyolysis, resolving Hypernatremia Anxiety depression bipolar PTA Rib fx from CPR Acute blood loss anemia on anemia of chronic disease   Poor cough mechanics, deconditioned, and doing very little with physical therapy and mobility. Though she understandably is quite anxious  after all that she's been through, and has some rib pain she is reluctant to do most forms of CPT, including metaneb. Residual left pleural effusion today is free-flowing/simple on bedside US and circumferential ~2cm around the lung.   Plan Continue trach collar oxygen wean for saturations greater than 88% Transferred to ICU for attempt at more aggressive pulmonary hygiene measures, this will include MetaNeb every 4 hour W/ albuterol Daily Yupelri and bid HTS nebs Diurese for net negative fluid balance, stop FWF Cont abx for steno/enterobacter pneumonia  Best Practice (right click and "Reselect all SmartList Selections" daily)    Diet/type: tubefeeds DVT prophylaxis: prophylactic heparin  GI prophylaxis: N/A Lines: N/A Foley:  N/A Code Status:  full code Last date of multidisciplinary goals of care discussion [pt updated at bedside today]    Laroy [facility] Pulmonary/Critical Care

## 2022-06-07 NOTE — Progress Notes (Signed)
Patient refused CPT with vest.

## 2022-06-07 NOTE — Progress Notes (Signed)
PROGRESS NOTE    Monica Hopkins  OFB:[id number] DOB: 1970/10/09 DOA: 05/19/2022 PCP: Kaleen Mask, MD   Brief Narrative:  Patient is a 51 year old Caucasian female with a past medical history significant for but limited to bipolar disorder who after cardiac arrest.  Per her family she had been feeling sick and taking trazodone and Benadryl at night.  They thought she had COVID a couple weeks ago and then recovered somewhat.  The last few weeks she has been short of breath intermittently with a mild cough fevers and sore throat.  Her husband came home she is alert and talking and then he went about a minute about 10 minutes later he got back to find her unconscious.  He started CPR immediately and EMS came fairly quickly.  She is reportedly found in PEA arrest and was given 1 mg of epi as well as lidocaine.  Then subsequently she is found wants to be in a shockable rhythm and was shocked and received ROSC after about 26 minutes while in route.  Per the family she is gone under a large weight loss a few years ago with unclear cause and has felt it was from the lithium but is unclear when she last taken this.  She has had poor p.o. intake from low appetite nausea vomiting and she apparently not wanted to go to the physician and had only recently done a outpatient follow-up with psychiatry.  Normally she sleeps in recliner.  In the ED when she was born and she is intubated and was hypothermic.  ABG was 7.206 with a PCO2 49 and O2 level of 82.  She was given IV vancomycin and cefepime and was noted to have AKI.  Home medications include Klonopin and trazodone.  Currently she was admitted and significant hospital events included   Significant Hospital Events: Including procedures, antibiotic start and stop dates in addition to other pertinent events   10/17: admitted post cardiac arrest; ROSC 26 minutes 10/20: off pressors, will awaken agitated, desaturates SBT.  10/23 failed extubation trial 10/25  trach 06/04/2022 she was transferred to the [facility] service 06/04/22 she desaturated on trach collar and PCCM was reconsulted. 06/05/22.  Continues to fail at swallowing and will need alternate means of feeding and she is resistant to PEG tube.  Consulted palliative care for further goals of care discussion and will also need a psychiatric evaluation 06/06/22.  Apparently desaturated again rapid response was called on her again.  Pulmonary was reevaluating and recommending more aggressive pulmonary toileting and thinks that her desaturations were from mucous plugging.  They are recommending attempting ramping up measures with direct airway clearance as detailed and requiring a trial of chest vest as well as bronchodilators and antibiotics.  They are recommending repeating a chest x-ray Monday and if she is failing to clear up the consolidation and recommending obtaining repeat chest CT to see what extent the left opacity may be plugging versus consolidation versus pleural effusion.  Pulmonary recommends continuing supplemental oxygen for saturation greater than 88%.  Psychiatry was consulted and they are recommending discontinuing Seroquel and starting Seroquel 50 mg 3 times daily as needed for agitation only and started clonazepam 0.5 mg in the morning as well as continuing Depakote and starting trazodone.  Patient is now amenable to gastrostomy tube placement if necessary and we will likely pursue this this week if she continues to fail she needs an alternate way of continued nutrition   06/07/22.  Patient desaturated again and it took  almost an hour to bring her saturations up.  She refused her chest vest and MetaNeb this morning and had to be bagged and lavaged and chest x-ray showed whiteout of her left lung.  After further discussion with pulmonary PCCM the decision was made to transfer to the intensive care unit for a more aggressive pulmonary hygiene regimen given her continued desaturations.   Assessment  and Plan: No notes have been filed under this hospital service. Service: Hospitalist  S/p cardiac arrest: found to be in PEA/shockable rhythm, in the setting of hypoxia due to pneumococcal pneumonia  Acute hypoxic respiratory failure due to Strep pneumonia pneumonia, complicated with aspiration and healthcare associated pneumonia with Enterobacter, Klebsiella and stenotrophomonas now s/p trach -Patient tolerated trach collar for 24 hours, intermittently desats due to agitation and restlessness but oxygen saturation improves when she is calm and relaxed: Desaturated significantly to 70% the day before yesterday for about half an hour so PCCM was reconsulted and subsequently she improved with suctioning.  She did the same thing again today and pulmonary consulted and they feel that she may have some mucous plugging hours following some aggressive pulmonary toileting and chest vest -We will repeat the chest x-ray on Monday continue bronchodilators -Meropenem changed to Bactrim given improvement in renal function -Continue trach collar as long as patient can tolerate; PCCM will monitor weekly -Chest x-ray today showed "Worsening opacification the left hemithorax when compared with the prior study. Persistent right basilar atelectasis." -ABG Labs (Brief)          Component Value Date/Time    PHART 7.47 (H) 06/06/2022 1040    PCO2ART 35 06/06/2022 1040    PO2ART 64 (L) 06/06/2022 1040    HCO3 25.6 06/06/2022 1040    TCO2 28 06/03/2022 1139    ACIDBASEDEF 2.0 05/20/2022 0243    O2SAT 94.9 06/06/2022 1040      -SLP working with Passy-Muir valve -Continue pulm toileting and added Xopenex in addition to pupillary -SLP recommending continuing n.p.o. and ice chips as needed after oral care; MBS done and still recommending n.p.o. with ice chips to be continued after oral care given that she exhibited since aspiration of all trials including thin liquids, nectar thick liquids and pure aspiration  triggered coughing but was ineffective and expelling all of aspirated and aspirated material was expelled with subsequently aspirated again due to poor pharyngeal clearance -SLP feels that she may have acute on chronic dysphagia in the possible setting of recent significant weight loss after use of lithium -PT OT recommending SNF versus AIR if she improves her tolerance -Palliative care consulted for further goals of care discussion -Given Worsening respiratory status and continued desaturations Pulmonary evaluated and felt that she would be better served being watched in the ICU with aggressive Pulmonary Hygiene -She is being started on a Dexmedetomidine gtt    Acute septic/toxic encephalopathy-improved -Mental status has significantly improved, she is extremely anxious at baseline and has significant emotional lability -Patient is alert and awake and following commands -Avoid deep sedation -Resumed her home Klonopin and she takes 2 mg nightly and now psychiatry is adding 0.5 mg in the morning -Change Dilaudid to as needed and monitor   New diagnosis of acute HFpEF  -Monitor intake and output  -She looks euvolemic -Was holding diuretic but now will get IV Lasix 40 mg x1 -Chest x-ray done today and showed "Stable cardiomediastinal silhouette. Tracheostomy and feeding tubes are unchanged in position. Stable bibasilar opacities are noted concerning for atelectasis pneumonia. Small  left pleural effusion is noted. Bony thorax is unremarkable." -She appears euvolemic and patient is +0.435 L since admission -Continue monitor for signs and symptoms of volume overload   Severe protein calorie malnutrition -Nutrition Status: Nutrition Problem: Severe Malnutrition Etiology: social / environmental circumstances (poor PO intake related to poor dentition after taking lithium) Signs/Symptoms: severe fat depletion, severe muscle depletion Interventions: Refer to RD note for recommendations -MBS  done -Per my discussion with dietary today she will likely need a PEG tube and will discuss with the patient and she is is resistant against this currently but now more amenable -Appreciate palliative care goals of care discussion   Acute rhabdomyolysis, resolved Hyperkalemia/Hypokalemia, resolved AKI due to septic ATN, rhabdomyolysis Hypernatremia Serum creatinine continues to improve Monitor intake and output -Avoid further nephrotoxic medications, contrast dyes, hypotension and dehydration ensure adequate renal perfusion and renally dose medications -Patient's BUNs/creatinine is now improved to 61/1.14 Serum sodium is trending down and has now normalized to 141 -Continue free water flushes now 200 every 2 hours   Anxiety depression bipolar PTA Rib fx from CPR -Continue Klonopin, Depakote and Seroquel -Given her emotional lability and extreme mood swings psychiatry was consulted and made some medication changes and now discontinue the Seroquel and started trazodone and also added morning Klonopin   Acute blood loss anemia on anemia of chronic disease -Patient hemoglobin has stabilized now -Monitor H&H and transfuse if less than 7 -Anemia panel done and showed an iron level of 61, U IBC of 93, TIBC of 154, saturation ratios of 40%, ferritin level of 415, folate of 17.3, vitamin B-12 level 1088 -Hemoglobin/hematocrit is now 7.9/24.4 -> 7.1/22.2 -> 7.7/24.0 -> 7.4/23.0 -Continue to Monitor for S/Sx of Bleeding; no over bleeding noted    Thrombocytosis -The patient's platelet count went from 413 -> 546 -> 467 -> 501 -> 486 -Continue monitor and trend and repeat CBC in a.m.   Hypoalbuminemia -The patient albumin level is now gone from 1.9 -> 2.2 -> 1.8 -> 2.1 -> 2.0 -Continue to monitor trend and repeat CMP in a.m.   DVT prophylaxis: heparin injection 5,000 Units Start: 05/19/22 2200 SCDs Start: 05/19/22 2151    Code Status: Full Code Family Communication: No family present at  bedside  Disposition Plan:  Level of care: ICU Status is: Inpatient Remains inpatient appropriate because: Transferring to the ICU due to continued Desaturations   Consultants:  PCCM pulmonary Palliative care medicine Psychiatry  Procedures:  As delineated as above  Antimicrobials:  Anti-infectives (From admission, onward)    Start     Dose/Rate Route Frequency Ordered Stop   06/04/22 1045  sulfamethoxazole-trimethoprim (BACTRIM DS) 800-160 MG per tablet 2 tablet        2 tablet Oral Every 12 hours 06/04/22 0950     06/01/22 1030  meropenem (MERREM) 2 g in sodium chloride 0.9 % 100 mL IVPB  Status:  Discontinued        2 g 280 mL/hr over 30 Minutes Intravenous Every 12 hours 06/01/22 0933 06/04/22 0950   05/27/22 1600  piperacillin-tazobactam (ZOSYN) IVPB 3.375 g  Status:  Discontinued        3.375 g 12.5 mL/hr over 240 Minutes Intravenous Every 8 hours 05/27/22 1018 06/01/22 0930   05/27/22 1130  vancomycin (VANCOREADY) IVPB 1250 mg/250 mL  Status:  Discontinued        1,250 mg 166.7 mL/hr over 90 Minutes Intravenous Every 24 hours 05/27/22 1036 05/27/22 1037   05/27/22 1130  vancomycin (VANCOREADY) IVPB  1250 mg/250 mL  Status:  Discontinued        1,250 mg 166.7 mL/hr over 90 Minutes Intravenous Every 48 hours 05/27/22 1037 05/28/22 0832   05/27/22 1115  vancomycin (VANCOCIN) IVPB 1000 mg/200 mL premix  Status:  Discontinued        1,000 mg 200 mL/hr over 60 Minutes Intravenous  Once 05/27/22 1018 05/27/22 1036   05/27/22 1115  piperacillin-tazobactam (ZOSYN) IVPB 3.375 g        3.375 g 100 mL/hr over 30 Minutes Intravenous  Once 05/27/22 1018 05/27/22 1119   05/20/22 2200  vancomycin (VANCOREADY) IVPB 750 mg/150 mL  Status:  Discontinued        750 mg 150 mL/hr over 60 Minutes Intravenous Every 24 hours 05/19/22 2158 05/20/22 1017   05/20/22 2000  vancomycin (VANCOCIN) IVPB 1000 mg/200 mL premix  Status:  Discontinued        1,000 mg 200 mL/hr over 60 Minutes  Intravenous Every 24 hours 05/19/22 2107 05/19/22 2158   05/20/22 1115  cefTRIAXone (ROCEPHIN) 2 g in sodium chloride 0.9 % 100 mL IVPB        2 g 200 mL/hr over 30 Minutes Intravenous Every 24 hours 05/20/22 1017 05/25/22 1042   05/20/22 1000  ceFEPIme (MAXIPIME) 2 g in sodium chloride 0.9 % 100 mL IVPB  Status:  Discontinued        2 g 200 mL/hr over 30 Minutes Intravenous Every 12 hours 05/19/22 2107 05/20/22 1017   05/19/22 2200  vancomycin (VANCOREADY) IVPB 1250 mg/250 mL        1,250 mg 166.7 mL/hr over 90 Minutes Intravenous  Once 05/19/22 2158 05/20/22 0116   05/19/22 2100  ceFEPIme (MAXIPIME) 2 g in sodium chloride 0.9 % 100 mL IVPB        2 g 200 mL/hr over 30 Minutes Intravenous  Once 05/19/22 2048 05/19/22 2208   05/19/22 2100  vancomycin (VANCOREADY) IVPB 1500 mg/300 mL  Status:  Discontinued        1,500 mg 150 mL/hr over 120 Minutes Intravenous  Once 05/19/22 2048 05/19/22 2158       Subjective: Seen and examined at bedside and she had just desaturated early on and it was a difficult time to bring her back up but now she is saturating good.  She continues to have some chest discomfort and refusing MetaNeb and CPT.  Very anxious.  Pulmonary consulted at bedside and recommending transferring to the ICU for further care given her continued desaturations.  Objective: Vitals:   06/07/22 1430 06/07/22 1500 06/07/22 1530 06/07/22 1600  BP: 111/76 (!) 108/95 125/80   Pulse: 81 94 95   Resp: 20 (!) 23 (!) 22   Temp:    98.6 F (37 C)  TempSrc:    Oral  SpO2: 100% 97% 91%   Weight:      Height:        Intake/Output Summary (Last 24 hours) at 06/07/2022 1624 Last data filed at 06/07/2022 1500 Gross per 24 hour  Intake --  Output 800 ml  Net -800 ml   Filed Weights   06/02/22 0335 06/03/22 0500 06/07/22 0338  Weight: 55.2 kg 56.3 kg 74 kg   Examination: Physical Exam:  Constitutional: Thin cachectic chronically ill-appearing Caucasian female who is extremely  anxious Neck: Has a tracheostomy in place connected 98% FiO2 trach collar Respiratory: Diminished to auscultation bilaterally with coarse breath sounds and wearing a trach collar tracheostomy in place, no wheezing, rales, rhonchi  or crackles.  Had an increased respiratory rate Cardiovascular: Tachycardic rate but regular rhythm, no murmurs / rubs / gallops. S1 and S2 auscultated. No extremity edema.  Abdomen: Soft, non-tender, non-distended. Bowel sounds positive.  GU: Deferred. Musculoskeletal: No clubbing / cyanosis of digits/nails. No joint deformity upper and lower extremities.  Skin: No rashes, lesions, ulcers on limited skin evaluation. No induration; Warm and dry.  Neurologic: CN 2-12 grossly intact with no focal deficits. Romberg sign cerebellar reflexes not assessed.  Psychiatric: Normal judgment and insight. Alert and oriented x 3.  Extremely anxious  Data Reviewed: I have personally reviewed following labs and imaging studies  CBC: Recent Labs  Lab 06/02/22 0343 06/02/22 1316 06/03/22 0439 06/03/22 1139 06/04/22 1109 06/05/22 0106 06/06/22 1019 06/07/22 0140  WBC 19.3*  --  9.2  --  10.9* 8.8 10.3 11.9*  NEUTROABS 17.0*  --   --   --  7.9* 5.8 7.4 8.2*  HGB 7.1*   < > 7.3* 7.8* 7.9* 7.1* 7.7* 7.4*  HCT 22.2*   < > 23.5* 23.0* 24.4* 22.2* 24.0* 23.0*  MCV 99.1  --  100.9*  --  96.4 98.2 95.6 99.6  PLT 413*  --  451*  --  546* 467* 501* 486*   < > = values in this interval not displayed.   Basic Metabolic Panel: Recent Labs  Lab 06/02/22 0343 06/02/22 1316 06/03/22 0439 06/03/22 1139 06/04/22 0857 06/05/22 0106 06/06/22 1019 06/07/22 0140  NA 151*   < > 148* 147* 144 141 136 134*  K 3.5   < > 3.8 3.8 3.7 3.5 3.7 4.2  CL 114*  --  112*  --  108 107 106 104  CO2 28  --  27  --  25 25 21* 22  GLUCOSE 115*  --  94  --  101* 107* 137* 103*  BUN 69*  --  61*  --  62* 69* 58* 61*  CREATININE 1.47*  --  1.17*  --  1.05* 1.15* 1.19* 1.14*  CALCIUM 9.8  --  9.5  --   9.8 9.0 8.7* 8.7*  MG 2.2  --   --   --  1.9 1.8 1.7 1.8  PHOS 4.1  --   --   --  3.6 3.9 4.1 4.6   < > = values in this interval not displayed.   GFR: Estimated Creatinine Clearance: 60.1 mL/min (A) (by C-G formula based on SCr of 1.14 mg/dL (H)). Liver Function Tests: Recent Labs  Lab 06/02/22 0343 06/04/22 0857 06/05/22 0106 06/06/22 1019 06/07/22 0140  AST 21 26 20 31 27   ALT 23 25 21 24 21   ALKPHOS 61 63 57 62 63  BILITOT 0.5 0.4 0.4 0.3 0.4  PROT 5.9* 6.1* 4.8* 5.5* 4.9*  ALBUMIN 1.9* 2.2* 1.8* 2.1* 2.0*   No results for input(s): "LIPASE", "AMYLASE" in the last 168 hours. No results for input(s): "AMMONIA" in the last 168 hours. Coagulation Profile: No results for input(s): "INR", "PROTIME" in the last 168 hours. Cardiac Enzymes: No results for input(s): "CKTOTAL", "CKMB", "CKMBINDEX", "TROPONINI" in the last 168 hours. BNP (last 3 results) No results for input(s): "PROBNP" in the last 8760 hours. HbA1C: No results for input(s): "HGBA1C" in the last 72 hours. CBG: Recent Labs  Lab 06/06/22 2305 06/07/22 0337 06/07/22 0923 06/07/22 1144 06/07/22 1601  GLUCAP 110* 110* 103* 87 86   Lipid Profile: No results for input(s): "CHOL", "HDL", "LDLCALC", "TRIG", "CHOLHDL", "LDLDIRECT" in the last 72 hours.  Thyroid Function Tests: No results for input(s): "TSH", "T4TOTAL", "FREET4", "T3FREE", "THYROIDAB" in the last 72 hours. Anemia Panel: Recent Labs    06/05/22 0106  VITAMINB12 1,088*  FOLATE 17.3  FERRITIN 415*  TIBC 154*  IRON 61  RETICCTPCT 2.4   Sepsis Labs: No results for input(s): "PROCALCITON", "LATICACIDVEN" in the last 168 hours.  Recent Results (from the past 240 hour(s))  Culture, Respiratory w Gram Stain     Status: None   Collection Time: 05/30/22  9:23 AM   Specimen: Tracheal Aspirate; Respiratory  Result Value Ref Range Status   Specimen Description TRACHEAL ASPIRATE  Final   Special Requests NONE  Final   Gram Stain   Final    MODERATE  WBC PRESENT,BOTH PMN AND MONONUCLEAR FEW GRAM NEGATIVE RODS FEW SQUAMOUS EPITHELIAL CELLS PRESENT Performed at [facility], [place] [address] [place], [place] [address]    Culture   Final    FEW ENTEROBACTER CLOACAE FEW STENOTROPHOMONAS MALTOPHILIA    Report Status 06/02/2022 FINAL  Final   Organism ID, Bacteria ENTEROBACTER CLOACAE  Final   Organism ID, Bacteria STENOTROPHOMONAS MALTOPHILIA  Final      Susceptibility   Enterobacter cloacae - MIC*    CEFAZOLIN >=64 RESISTANT Resistant     CEFEPIME 4 INTERMEDIATE Intermediate     CEFTAZIDIME >=64 RESISTANT Resistant     CIPROFLOXACIN <=0.25 SENSITIVE Sensitive     GENTAMICIN <=1 SENSITIVE Sensitive     IMIPENEM <=0.25 SENSITIVE Sensitive     TRIMETH/SULFA <=20 SENSITIVE Sensitive     PIP/TAZO >=128 RESISTANT Resistant     * FEW ENTEROBACTER CLOACAE   Stenotrophomonas maltophilia - MIC*    LEVOFLOXACIN 0.25 SENSITIVE Sensitive     TRIMETH/SULFA <=20 SENSITIVE Sensitive     * FEW STENOTROPHOMONAS MALTOPHILIA  Culture, Respiratory w Gram Stain     Status: None (Preliminary result)   Collection Time: 06/06/22  4:56 PM   Specimen: Tracheal Aspirate; Respiratory  Result Value Ref Range Status   Specimen Description TRACHEAL ASPIRATE  Final   Special Requests NONE  Final   Gram Stain   Final    ABUNDANT SQUAMOUS EPITHELIAL CELLS PRESENT RARE GRAM NEGATIVE RODS RARE GRAM POSITIVE RODS FEW WBC PRESENT, PREDOMINANTLY PMN    Culture   Final    Normal respiratory flora-no Staph aureus or Pseudomonas seen Performed at [facility], [address], [place],  [address]    Report Status PENDING  Incomplete     Radiology Studies: DG CHEST PORT 1 VIEW  Result Date: 06/07/2022 CLINICAL DATA:  Mucous plugging of bronchi.  Left lung collapse. EXAM: PORTABLE CHEST 1 VIEW COMPARISON:  Prior today FINDINGS: Tracheostomy tube and feeding tube remain in place. Improved aeration of the left lung is seen. Moderate left pleural  effusion and left lower lobe atelectasis or infiltrate are seen. Increased atelectasis or infiltrate noted in the right lung base. Heart size remains within normal limits. No pneumothorax visualized. IMPRESSION: Improved aeration of left lung. Moderate left pleural effusion and left lower lobe atelectasis or infiltrate. Increased right basilar atelectasis or infiltrate. Electronically Signed   By: Marlaine Hind M.D.   On: 06/07/2022 15:01   DG CHEST PORT 1 VIEW  Result Date: 06/07/2022 CLINICAL DATA:  Shortness of breath EXAM: PORTABLE CHEST 1 VIEW COMPARISON:  06/06/2022 FINDINGS: Cardiac shadow is stable. Volume loss of the left is noted with complete opacification of left hemithorax when compared with the prior study. Tracheostomy tube and feeding catheter  are noted. Postsurgical changes in the cervical spine are seen. Persistent atelectasis is noted in the right base although slightly improved when compared with the prior exam. IMPRESSION: Worsening opacification the left hemithorax when compared with the prior study. Persistent right basilar atelectasis. Electronically Signed   By: Alcide Clever M.D.   On: 06/07/2022 09:47   DG CHEST PORT 1 VIEW  Result Date: 06/06/2022 CLINICAL DATA:  Shortness of breath EXAM: PORTABLE CHEST 1 VIEW COMPARISON:  06/05/2022 and prior studies FINDINGS: Tracheostomy tube and small bore feeding tube again noted. Increasing opacity overlying the mid and lower LEFT lung noted which may represent airspace disease, atelectasis and/or effusion. There is no evidence of pneumothorax. RIGHT basilar opacity/atelectasis again noted. No other changes identified. IMPRESSION: Increasing opacity overlying the mid and lower LEFT lung which may represent airspace disease, atelectasis and/or effusion. Unchanged RIGHT basilar opacity/atelectasis. Electronically Signed   By: Harmon Pier M.D.   On: 06/06/2022 11:08    Scheduled Meds:  acetaminophen (TYLENOL) oral liquid 160 mg/5 mL  650 mg  Per Tube QID   albuterol  2.5 mg Nebulization Q4H   ascorbic acid  500 mg Per Tube BID   Chlorhexidine Gluconate Cloth  6 each Topical Daily   clonazePAM  0.5 mg Per Tube Daily   clonazePAM  2 mg Per Tube QHS   docusate  100 mg Per Tube BID   famotidine  20 mg Per Tube BID   feeding supplement (VITAL AF 1.2 CAL)  1,000 mL Per Tube Q24H   fiber supplement (BANATROL TF)  60 mL Per Tube BID   folic acid  1 mg Per Tube Daily   furosemide  40 mg Intravenous Once   gabapentin  300 mg Per Tube BID   heparin  5,000 Units Subcutaneous Q8H   insulin aspart  0-9 Units Subcutaneous Q4H   leptospermum manuka honey  1 Application Topical Daily   metoprolol tartrate  25 mg Per Tube Q8H   nutrition supplement (JUVEN)  1 packet Per Tube BID BM   mouth rinse  15 mL Mouth Rinse 4 times per day   polyethylene glycol  17 g Per Tube Daily   polyethylene glycol  17 g Per Tube Daily   revefenacin  175 mcg Nebulization Daily   senna-docusate  1 tablet Oral QHS   sodium chloride HYPERTONIC  4 mL Nebulization BID   sulfamethoxazole-trimethoprim  2 tablet Oral Q12H   thiamine  100 mg Per Tube Daily   valproic acid  250 mg Per Tube BID   vitamin A  10,000 Units Oral Daily   Continuous Infusions:  sodium chloride     sodium chloride Stopped (05/27/22 1640)   dexmedetomidine (PRECEDEX) IV infusion 0.4 mcg/kg/hr (06/07/22 1616)    LOS: 19 days   Marguerita Merles, DO [facility] Available via Epic secure chat 7am-7pm After these hours, please refer to coverage provider listed on [email or web address] 06/07/2022, 4:24 PM

## 2022-06-07 NOTE — Progress Notes (Addendum)
RRT called to bedside due to pt's SATs sustaining in the 70s. NP at bedside assisting with tracheal suction and SATs recovered. Multiple pulmonary therapies used with a large amount of encouragement. Pt states the chest vest hurts her ribs, metaneb used for breathing treatments instead, pt tolerated this therapy well for 10 minutes. Pt also manually bagged for a short period of time with 10cc of saline and then suctioned several times. SATs sustaining mid 90s. CXR was obtained before therapies. Pt is very anxious. RRT spent 50 minutes at beside with this pt.

## 2022-06-07 NOTE — Progress Notes (Signed)
RT attempted to give pt breathing treatment and metaneb for 1600 tx time. Pt was able to get off metaneb tx and stated she didn't want the tx. RT insisted that pt take tx at this time to help her lungs. Pt again refused, RT modified the metaneb and asked pt to try tx one more time and pt slapped RT's hands away and would not allow metaneb to connect back to trach. RT informed pt she needed the tx's and pt continued to refuse.

## 2022-06-08 DIAGNOSIS — B3781 Candidal esophagitis: Secondary | ICD-10-CM

## 2022-06-08 DIAGNOSIS — Z7189 Other specified counseling: Secondary | ICD-10-CM

## 2022-06-08 DIAGNOSIS — H6593 Unspecified nonsuppurative otitis media, bilateral: Secondary | ICD-10-CM

## 2022-06-08 DIAGNOSIS — J90 Pleural effusion, not elsewhere classified: Secondary | ICD-10-CM

## 2022-06-08 DIAGNOSIS — F319 Bipolar disorder, unspecified: Secondary | ICD-10-CM

## 2022-06-08 DIAGNOSIS — T17998A Other foreign object in respiratory tract, part unspecified causing other injury, initial encounter: Secondary | ICD-10-CM

## 2022-06-08 DIAGNOSIS — B370 Candidal stomatitis: Secondary | ICD-10-CM

## 2022-06-08 LAB — CBC
HCT: 21.9 % — ABNORMAL LOW (ref 36.0–46.0)
Hemoglobin: 7.1 g/dL — ABNORMAL LOW (ref 12.0–15.0)
MCH: 31.7 pg (ref 26.0–34.0)
MCHC: 32.4 g/dL (ref 30.0–36.0)
MCV: 97.8 fL (ref 80.0–100.0)
Platelets: 446 10*3/uL — ABNORMAL HIGH (ref 150–400)
RBC: 2.24 MIL/uL — ABNORMAL LOW (ref 3.87–5.11)
RDW: 16.3 % — ABNORMAL HIGH (ref 11.5–15.5)
WBC: 6 10*3/uL (ref 4.0–10.5)
nRBC: 0 % (ref 0.0–0.2)

## 2022-06-08 LAB — GLUCOSE, CAPILLARY
Glucose-Capillary: 96 mg/dL (ref 70–99)
Glucose-Capillary: 102 mg/dL — ABNORMAL HIGH (ref 70–99)
Glucose-Capillary: 91 mg/dL (ref 70–99)
Glucose-Capillary: 111 mg/dL — ABNORMAL HIGH (ref 70–99)
Glucose-Capillary: 93 mg/dL (ref 70–99)
Glucose-Capillary: 103 mg/dL — ABNORMAL HIGH (ref 70–99)

## 2022-06-08 LAB — BASIC METABOLIC PANEL
Anion gap: 7 (ref 5–15)
BUN: 50 mg/dL — ABNORMAL HIGH (ref 6–20)
CO2: 26 mmol/L (ref 22–32)
Calcium: 9 mg/dL (ref 8.9–10.3)
Chloride: 106 mmol/L (ref 98–111)
Creatinine, Ser: 1 mg/dL (ref 0.44–1.00)
GFR, Estimated: >60 mL/min (ref >60)
Glucose, Bld: 94 mg/dL (ref 70–99)
Potassium: 4.5 mmol/L (ref 3.5–5.1)
Sodium: 139 mmol/L (ref 135–145)

## 2022-06-08 LAB — MAGNESIUM: Magnesium: 2.4 mg/dL (ref 1.7–2.4)

## 2022-06-08 MED ORDER — HYDROCOD POLI-CHLORPHE POLI ER 10-8 MG/5ML PO SUER
5.0000 mL | Freq: Two times a day (BID) | ORAL | Status: AC
  Administered 2022-06-08 – 2022-06-10 (×6): 5 mL
  Filled 2022-06-08 (×6): qty 5

## 2022-06-08 MED ORDER — VALPROIC ACID 250 MG/5ML PO SOLN
500.0000 mg | Freq: Two times a day (BID) | ORAL | Status: DC
  Administered 2022-06-08 – 2022-06-23 (×30): 500 mg
  Filled 2022-06-08 (×31): qty 10

## 2022-06-08 MED ORDER — FUROSEMIDE 10 MG/ML IJ SOLN
40.0000 mg | Freq: Once | INTRAMUSCULAR | Status: AC
  Administered 2022-06-08: 40 mg via INTRAVENOUS
  Filled 2022-06-08: qty 4

## 2022-06-08 MED ORDER — FLUCONAZOLE 100 MG PO TABS
200.0000 mg | ORAL_TABLET | Freq: Every day | ORAL | Status: AC
  Administered 2022-06-08 – 2022-06-14 (×7): 200 mg
  Filled 2022-06-08 (×2): qty 1
  Filled 2022-06-08 (×2): qty 2
  Filled 2022-06-08: qty 1
  Filled 2022-06-08 (×2): qty 2

## 2022-06-08 MED ORDER — CLONAZEPAM 0.5 MG PO TABS
1.0000 mg | ORAL_TABLET | Freq: Two times a day (BID) | ORAL | Status: DC | PRN
  Administered 2022-06-08 – 2022-06-11 (×6): 1 mg via ORAL
  Filled 2022-06-08 (×2): qty 1
  Filled 2022-06-08 (×2): qty 2
  Filled 2022-06-08 (×2): qty 1

## 2022-06-08 MED ORDER — HYDROMORPHONE HCL 1 MG/ML IJ SOLN
0.5000 mg | Freq: Every evening | INTRAMUSCULAR | Status: DC | PRN
  Administered 2022-06-08: 0.5 mg via INTRAVENOUS

## 2022-06-08 MED ORDER — QUETIAPINE FUMARATE 25 MG PO TABS
50.0000 mg | ORAL_TABLET | Freq: Three times a day (TID) | ORAL | Status: DC
  Administered 2022-06-08 – 2022-06-15 (×14): 50 mg via ORAL
  Filled 2022-06-08: qty 2
  Filled 2022-06-08: qty 1
  Filled 2022-06-08 (×2): qty 2
  Filled 2022-06-08: qty 1
  Filled 2022-06-08 (×2): qty 2
  Filled 2022-06-08: qty 1
  Filled 2022-06-08 (×6): qty 2
  Filled 2022-06-08 (×2): qty 1
  Filled 2022-06-08 (×2): qty 2
  Filled 2022-06-08: qty 1
  Filled 2022-06-08: qty 2

## 2022-06-08 MED ORDER — LIDOCAINE 5 % EX PTCH
1.0000 | MEDICATED_PATCH | CUTANEOUS | Status: AC
  Administered 2022-06-08: 1 via TRANSDERMAL
  Filled 2022-06-08: qty 1

## 2022-06-08 MED ORDER — FLUTICASONE PROPIONATE 50 MCG/ACT NA SUSP
1.0000 | Freq: Every day | NASAL | Status: DC
  Administered 2022-06-08 – 2022-06-25 (×18): 1 via NASAL
  Filled 2022-06-08: qty 16

## 2022-06-08 NOTE — Progress Notes (Signed)
Pharmacy Antibiotic Note  Monica Hopkins is a 51 y.o. female admitted on 05/19/2022 with multiple issues and now concern for oropharyngeal candidiasis. Pharmacy has been consulted for fluconazole dosing. Cr stable.  Plan: Fluconazole 200mg  PO q24h Pharmacy will sign off, reconsult as needed   Height: 5\' 6"  (167.6 cm) Weight:  (cannot weigh d/t bariatic bed not displaying weight) IBW/kg (Calculated) : 59.3  Temp (24hrs), Avg:98.3 F (36.8 C), Min:97.9 F (36.6 C), Max:98.6 F (37 C)  Recent Labs  Lab 06/04/22 0857 06/04/22 1109 06/05/22 0106 06/06/22 1019 06/07/22 0140 06/08/22 0408  WBC  --  10.9* 8.8 10.3 11.9* 6.0  CREATININE 1.05*  --  1.15* 1.19* 1.14* 1.00    Estimated Creatinine Clearance: 68.5 mL/min (by C-G formula based on SCr of 1 mg/dL).    Allergies  Allergen Reactions   Abilify [Aripiprazole] Swelling and Rash   Lamictal [Lamotrigine] Swelling and Rash   Latuda [Lurasidone] Other (See Comments)    Caused leg weakness   Arrie Senate, PharmD, BCPS, [facility] Clinical Pharmacist [phone] Please check AMION for all [place] numbers 06/08/2022

## 2022-06-08 NOTE — Progress Notes (Signed)
Daily Progress Note   Patient Name: Monica Hopkins       Date: 06/08/2022 DOB: 1970-11-05  Age: 51 y.o. MRN#: [id number] Attending Physician: Julian Hy, DO Primary Care Physician: Leonard Downing, MD Admit Date: 05/19/2022  Reason for Consultation/Follow-up: Establishing goals of care  Patient Profile/HPI:  Patient is a 51 year old Caucasian female with a past medical history significant for but limited to bipolar disorder who after cardiac arrest. Her husband came home she is alert and talking and then he went about a minute about 10 minutes later he got back to find her unconscious. She is being treated for sepsis d/t pneumonia. She has a trach in place. She has had ongoing respiratory compromise with concerns for mucous plugging. Using Passy-Muir valve. Cortrak in place. She has new diagnosis of HFpEF.   SLP eval revealed significant aspiration of all textures and she is NPO with only ice chips allowed. She has fractures through her Left anterior 2nd and 6th ribs from CPR. Palliative care has been asked to get involved for goals of care conversations in the setting of the need for a gastrostomy tube.   11/6- chest xray today showed improved aeration of L lung, mod L pleural effusion   Subjective: Chart reviewed including labs, progress notes, imaging from this and previous encounters.  Monica Hopkins is frustrated today and had many questions about her trach and PEG tube. She agrees to PEG tube and would like to know when it will be placed. She speaks well with PMV- but had desaturation earlier with it in.  She does not like to speak in "if's" regarding her prognosis and trajectory. She prefers that providers say, "when" for example, "when your trach heals", "when you leave the hospital".  She  feels like her ears are full and she is speaking "in a drum".  Her pressure ulcer on her bottom is causing her a great deal of pain. She also has rib fractures that are painful.  Her tongue and throat are sore.   Physical Exam Constitutional:      Comments: Frail, cachectic  HENT:     Right Ear: External ear normal.     Left Ear: External ear normal.     Ears:     Comments: fluid in R and L ears, TM intact, nonpurulent  Mouth/Throat:     Pharynx: Oropharyngeal exudate present.     Comments: White exudate on tongue and in back of throat Pulmonary:     Effort: Pulmonary effort is normal.     Comments: Trach collar in place Neurological:     General: No focal deficit present.     Mental Status: She is alert and oriented to person, place, and time.             Vital Signs: BP 99/65   Pulse (!) 107   Temp 97.9 F (36.6 C) (Oral)   Resp (!) 23   Ht 5\' 6"  (1.676 m)   Wt 74 kg   SpO2 93%   BMI 26.33 kg/m  SpO2: SpO2: 93 % O2 Device: O2 Device: Tracheostomy Collar O2 Flow Rate: O2 Flow Rate (L/min): 10 L/min  Intake/output summary:  Intake/Output Summary (Last 24 hours) at 06/08/2022 1335 Last data filed at 06/08/2022 1100 Gross per 24 hour  Intake 6642.14 ml  Output 1550 ml  Net 5092.14 ml   LBM: Last BM Date : 06/07/22 Baseline Weight: Weight: 61 kg Most recent weight: Weight:  (cannot weigh d/t bariatic bed not displaying weight)       Palliative Assessment/Data: PPS: 40%      Patient Active Problem List   Diagnosis Date Noted   Mucus plug in respiratory tract 06/08/2022   Pleural effusion 06/08/2022   Pneumonia of both lungs due to infectious organism 05/21/2022   Acute respiratory failure (HCC) 05/21/2022   Pressure injury of skin 05/20/2022   Protein-calorie malnutrition, severe 05/20/2022   Cardiac arrest (HCC) 05/19/2022   AKI (acute kidney injury) (HCC)    Urinary tract infection with hematuria    Sepsis (HCC) 08/16/2018   Bipolar 1 disorder (HCC)  08/16/2018   Cervical postlaminectomy syndrome 07/10/2015   Chronic pain syndrome 07/10/2015    Palliative Care Assessment & Plan    Assessment/Recommendations/Plan  Pharmacy consult for diflucan dosing d/t oropharyngeal candidiasis likely with esophageal involvement as well Tussionex 10/8mg  suspension q12 hrs for three days and Flonase one spray each nare daily for otitis media with effusion GOC are clear for continued aggressive medical care PMT will follow for improvement vs decline   Code Status: Full code  Prognosis:  Unable to determine  Discharge Planning: To Be Determined  Care plan was discussed with patient.  Thank you for allowing the Palliative Medicine Team to assist in the care of this patient.  Total time: 80 mins  Greater than 50%  of this time was spent counseling and coordinating care related to the above assessment and plan.  14/02/2015, AGNP-C Palliative Medicine   Please contact Palliative Medicine Team phone at [phone] for questions and concerns.

## 2022-06-08 NOTE — Progress Notes (Signed)
NAME:  Keyetta Hollingworth, MRN:  [id number], DOB:  Dec 16, 1970, LOS: 56 ADMISSION DATE:  05/19/2022, CONSULTATION DATE:  05/19/22 REFERRING MD:  Billy Fischer, CHIEF COMPLAINT:  found down, cardiac arrest    History of Present Illness:  51 yo woman with hx of bipolar disorder,  here after cardiac arrest.   Per family has been feeling sick and taking trazodone and benadryl at night. They thought she had covid a couple of weeks ago, then recovered somewhat.  For past two weeks has been sob intermittently with some minimal cough, fevers, sore throat.  Her husband came home and she was alert and talking, he went to the bathroom.  10- min later he came back to find her unconscious.  He started CPR immediately, EMS came fairly quickly.  She was reportedly found to be in PEA.  Given 1 mg epi, also lidocaine.  Found once to be in shockable rhythm, shocked once.  Rosc after about 26 min, while in route.   Per family has undergone large weight loss  a few years ago, unclear cause.  Patient had felt it was from lithium but unclear when last takng this..  Poor po intake from low appeitite, nausea and vomiting.  She apparently has not wanted to go to visit the dr.  She recently has only done phone visits with psychiatry.   Sleeps sitting up in recliner.   Per her mother, she has not felt well in years, has complained of some virus or other acute illness affecting her for the past year.   Intubated in ED ( Had king airway in place) at 820 per drug record.  In ED hypothermic  ABG 7.206/49/82 Cefepime and vanc ordered.   AKI cr 1.2 from 0.79 WBC 14.3   Home meds klonipin, trazodone depakote.   Pertinent  Medical History  Bipolar disorder Unexplained weight loss  Poor dentition.  Significant Hospital Events: Including procedures, antibiotic start and stop dates in addition to other pertinent events   10/17: admitted post cardiac arrest; ROSC 26 minutes 10/20: off pressors, will awaken agitated,  desaturates SBT.  10/23 failed extubation trial 10/25 trach 11/4 desaturation in setting L mucus plugging and small effusion. Has been refusing CPT 11/5 Desaturation again this morning, refusing chest vest, metaneb. Did metaneb after some insistence this morning with RT, Moved to ICU 11/6 tolerating trach collar, accepting nebs, c/o pain  Interim History / Subjective:   No overnight events, agitated, argumentative, asking for more pain medications Accepted nebs, but chest PT deferred 2/2 agitation 1.1L UOP after Lasix  Objective   Blood pressure 99/63, pulse 96, temperature 98.5 F (36.9 C), temperature source Oral, resp. rate 18, height 5\' 6"  (1.676 m), weight 74 kg, SpO2 90 %.    FiO2 (%):  [40 %-98 %] 40 %   Intake/Output Summary (Last 24 hours) at 06/08/2022 0908 Last data filed at 06/08/2022 0700 Gross per 24 hour  Intake 6362.14 ml  Output 1150 ml  Net 5212.14 ml   Filed Weights   06/02/22 0335 06/03/22 0500 06/07/22 0338  Weight: 55.2 kg 56.3 kg 74 kg      General:  anxious, chronically ill-appearing F HEENT: MM pink/moist, trach in place, poor dentition, temporal wasting, cortrak in place Neuro: awake, alert, oriented, writing and mouthing words to communicate, with PMV argumentative, anxious CV: s1s2 rrr, no m/r/g PULM:  scattered rhonchi with good air movement bilaterally GI: soft, non-distended Extremities: warm/dry, poor muscle bulk and tone, no edema  Skin: no rashes or  lesions    Labs/imaging reviewed Na 139 BUN 50   CXR with improved aeration after CPT, residual left effusion  Resolved Hospital Problem list   Encephalopathy Acute rhabdomyolysis Hyperkalemia/Hypokalemia AKI due to septic ATN, rhabdomyolysis Hypernatremia  Assessment & Plan:   Acute hypoxic respiratory failure, S/p cardiac arrest Trach dependent s/p cardiac arrest c/b polymicrobial HCAP, most recently stenotrophomonas/enterobacter and intermittent mucous plugging Poor cough  mechanics, deconditioned, and doing very little with physical therapy and mobility. Though she understandably is quite anxious after all that she's been through, and has some rib pain she is reluctant to do most forms of CPT, including metaneb. Residual left pleural effusion is free-flowing/simple on bedside US  (11/5) and circumferential ~2cm around the lung.  -Continue trach collar oxygen wean for saturations greater than 88% - was transferred to ICU for attempt at more aggressive pulmonary hygiene measures, this will include MetaNeb every 4 hour W/ albuterol, has been compliant with nebs and has no current critical care needs -work with PT today - continue Yupelri and bid HTS nebs -diuresed yesterday with 1L UOP, remains 6L positive, additional Lasix 40mg  today -continue Bactrim for steno/enterobacter pneumonia    New diagnosis of acute HFpEF  -Lasix and beta blocker   Severe protein calorie malnutrition -continue TF   Acute blood loss anemia on anemia of chronic disease -Hgb stable, continue to follow and transfuse for Hgb <7   Anxiety depression bipolar PTA Rib fx from CPR -agitated and has been refusing care, complaining of rib, back and neck pain, especially at night -add qhs prn dose Dilaudid 0.5mg  -scheduled tylenol -add lidocaine patch -increase bid clonazepam to 1mg  bid -Depakote icreased -scheduled seroquel    Best Practice (right click and "Reselect all SmartList Selections" daily)    Diet/type: tubefeeds DVT prophylaxis: prophylactic heparin  GI prophylaxis: N/A Lines: N/A Foley:  N/A Code Status:  full code Last date of multidisciplinary goals of care discussion [pt and family updated at the bedside 11/6]   Raegan Winders, PA-C [facility] See Amion for pager If no response to pager , please call [phone] until 7pm After 7:00 pm call Elink  [phone]

## 2022-06-08 NOTE — Progress Notes (Signed)
° °  Inpatient Rehab Admissions Coordinator : ° °Per therapy change in recommendations, patient was screened for CIR candidacy by Mcgregor Tinnon RN MSN.  At this time patient appears to be a potential candidate for CIR. I will place a rehab consult per protocol for full assessment. Please call me with any questions. ° °Demonie Kassa RN MSN °Admissions Coordinator °[phone] °  °

## 2022-06-08 NOTE — Progress Notes (Signed)
RT/RN discussed and CPT held at this time due to patient refusing previous rounds and being agitated all shift. Currently resting.

## 2022-06-08 NOTE — Progress Notes (Signed)
Speech Language Pathology Treatment: Dysphagia;Passy Muir Speaking valve  Patient Details Name: Aniyla Harling MRN: [id number] DOB: Feb 04, 1971 Today's Date: 06/08/2022 Time: 6237-6283 SLP Time Calculation (min) (ACUTE ONLY): 25 min  Assessment / Plan / Recommendation Clinical Impression  Ms. Drum was seen for swallowing and PMV tx today.  She is currently in the ICU.  PMV could not be located so she was provided with a replacement valve.  Cuff was not deflated upon arrival - removed 5 ml of air and placed valve. Pt produced a large quantity of oral secretions; coughed and continued to produce sputum until she could calm herself down.  She tended to articulate sounds rapidly without phonating. Required continuous cues to engage phonation. Sp02 ranged from 83-94%. Valve was intermittently removed during session with no air trapping noted.  Pt had limited ice chips due to worry about aspiration.  Encouraged her to use an effortful swallow and to take her time. She demonstrated adequate performance, but her anxiety prohibited extended use of valve or further PO trials.  Valve was removed prior to leaving and cuff was reinflated per baseline. D/W RN.  SLP will continue to follow.   HPI HPI: Pt is a 51 y.o. female admitted 05/19/22 after cardiac arrest at home, ROSC 26 min, hypothermic. ETT 10/17-10/23; reintubated 10/23-trach10/25. Pt with new HFpEF. MRI brain negative for acute changes. CXR 10/30: Stable multifocal pulmonary infiltrates and small left pleural effusion. PMH: bipolar disorder.      SLP Plan  Continue with current plan of care      Recommendations for follow up therapy are one component of a multi-disciplinary discharge planning process, led by the attending physician.  Recommendations may be updated based on patient status, additional functional criteria and insurance authorization.    Recommendations  Diet recommendations: NPO Medication Administration: Via alternative means       Patient may use Passy-Muir Speech Valve: During all therapies with supervision         Oral Care Recommendations: Oral care prior to ice chip/H20 Follow Up Recommendations: Skilled nursing-short term rehab (<3 hours/day) Assistance recommended at discharge: Frequent or constant Supervision/Assistance SLP Visit Diagnosis: [name] (R49.1) Plan: Continue with current plan of care         Powell Halbert L. Tivis Ringer, MA CCC/SLP Clinical Specialist - Acute Care SLP Acute Rehabilitation Services Office number [phone]   Juan Quam Laurice  06/08/2022, 12:30 PM

## 2022-06-08 NOTE — Progress Notes (Signed)
Pt refused treatment, Lavaged and suctioned due to decrease in saturation, inner cannula changed. Increased o2 to 45%. Vitals  are stable at this time. Will continue to monitor.

## 2022-06-08 NOTE — Progress Notes (Signed)
Physical Therapy Treatment Patient Details Name: Monica Hopkins MRN: [id number] DOB: Feb 08, 1971 Today's Date: 06/08/2022   History of Present Illness Pt is a 51 y.o. female admitted 05/19/22 with cardiac arrest at home, ROSC 26 min, hypothermic. Intubated 10/17, failed extubation 10/23; trach placed 10/25. Pt with new HFpEF. PMH includes bipolar disorder.    PT Comments    Pt progressing towards her physical therapy goals and is agreeable to participate. Pt parents present and encouraging. Pt requiring min-mod assist for functional mobility (+2 safety). Taking pivotal steps from bed to chair with walker. Desat to 83% on 40% FiO2, 10L via trach collar. RN present to remove PMSV with rebound to 89%. Pt continues with debility, impaired balance, decreased core strength, decreased endurance. Based on age and PLOF, suspect continued progress. Recommend AIR to address deficits and maximize functional mobility.     Recommendations for follow up therapy are one component of a multi-disciplinary discharge planning process, led by the attending physician.  Recommendations may be updated based on patient status, additional functional criteria and insurance authorization.  Follow Up Recommendations  Acute inpatient rehab (3hours/day) Can patient physically be transported by private vehicle: No   Assistance Recommended at Discharge Frequent or constant Supervision/Assistance  Patient can return home with the following Two people to help with walking and/or transfers;Two people to help with bathing/dressing/bathroom;Direct supervision/assist for medications management;Help with stairs or ramp for entrance;Assist for transportation;Assistance with cooking/housework;Direct supervision/assist for financial management;Assistance with feeding   Equipment Recommendations  Wheelchair (measurements PT);Hospital bed;Wheelchair cushion (measurements PT)    Recommendations for Other Services       Precautions /  Restrictions Precautions Precautions: Fall;Other (comment) Precaution Comments: cortrak, trach collar, multiple wounds along spine and sacrum Restrictions Weight Bearing Restrictions: No     Mobility  Bed Mobility Overal bed mobility: Needs Assistance Bed Mobility: Supine to Sit     Supine to sit: Mod assist, HOB elevated     General bed mobility comments: Pt initiating bringing BLE's off edge of bed, modA at trunk to elevate to sitting position    Transfers Overall transfer level: Needs assistance Equipment used: Rolling walker (2 wheels) Transfers: Sit to/from Stand, Bed to chair/wheelchair/BSC Sit to Stand: Min assist, +2 safety/equipment   Step pivot transfers: Min assist, +2 safety/equipment       General transfer comment: MinA (+2 safety/equipment) to rise from edge of bed, pivotal steps from bed to chair with mild instability    Ambulation/Gait                   Stairs             Wheelchair Mobility    Modified Rankin (Stroke Patients Only)       Balance Overall balance assessment: Needs assistance Sitting-balance support: Feet supported Sitting balance-Leahy Scale: Fair     Standing balance support: Bilateral upper extremity supported Standing balance-Leahy Scale: Poor                              Cognition Arousal/Alertness: Awake/alert Behavior During Therapy: Anxious (intermittent) Overall Cognitive Status: Impaired/Different from baseline Area of Impairment: Attention, Memory, Awareness, Problem solving, Following commands, Safety/judgement                   Current Attention Level: Selective Memory: Decreased short-term memory Following Commands: Follows one step commands with increased time, Follows one step commands consistently Safety/Judgement: Decreased awareness of deficits, Decreased awareness of  safety Awareness: Emergent Problem Solving: Slow processing, Difficulty sequencing, Requires verbal  cues          Exercises General Exercises - Lower Extremity Long Arc Quad: Both, 10 reps, Seated Hip Flexion/Marching: Both, 10 reps, Seated Other Exercises Other Exercises: Seated: scapular retractions x 3    General Comments        Pertinent Vitals/Pain Pain Assessment Pain Assessment: Faces Faces Pain Scale: Hurts even more Pain Location: L ribs, back, neck Pain Descriptors / Indicators: Discomfort, Grimacing Pain Intervention(s): Limited activity within patient's tolerance, Monitored during session    Home Living                          Prior Function            PT Goals (current goals can now be found in the care plan section) Acute Rehab PT Goals Potential to Achieve Goals: Fair Progress towards PT goals: Progressing toward goals    Frequency    Min 3X/week      PT Plan Current plan remains appropriate    Co-evaluation              AM-PAC PT "6 Clicks" Mobility   Outcome Measure  Help needed turning from your back to your side while in a flat bed without using bedrails?: A Little Help needed moving from lying on your back to sitting on the side of a flat bed without using bedrails?: A Lot Help needed moving to and from a bed to a chair (including a wheelchair)?: A Little Help needed standing up from a chair using your arms (e.g., wheelchair or bedside chair)?: A Little Help needed to walk in hospital room?: A Lot Help needed climbing 3-5 steps with a railing? : Total 6 Click Score: 14    End of Session Equipment Utilized During Treatment: Gait belt;Oxygen Activity Tolerance: Patient tolerated treatment well Patient left: in chair;with call bell/phone within reach;with family/visitor present Nurse Communication: Mobility status PT Visit Diagnosis: Other abnormalities of gait and mobility (R26.89);Muscle weakness (generalized) (M62.81);Unsteadiness on feet (R26.81);Other symptoms and signs involving the nervous system  (R29.898);Difficulty in walking, not elsewhere classified (R26.2)     Time: [id number] PT Time Calculation (min) (ACUTE ONLY): 28 min  Charges:  $Therapeutic Activity: 23-37 mins                     Monica Hopkins, PT, DPT Acute Rehabilitation Services Office [phone]    Deno Etienne 06/08/2022, 2:53 PM

## 2022-06-09 ENCOUNTER — Telehealth (HOSPITAL_COMMUNITY): Payer: Self-pay | Admitting: Psychiatry

## 2022-06-09 ENCOUNTER — Inpatient Hospital Stay (HOSPITAL_COMMUNITY): Payer: Medicaid Other

## 2022-06-09 LAB — GLUCOSE, CAPILLARY
Glucose-Capillary: 78 mg/dL (ref 70–99)
Glucose-Capillary: 119 mg/dL — ABNORMAL HIGH (ref 70–99)
Glucose-Capillary: 125 mg/dL — ABNORMAL HIGH (ref 70–99)
Glucose-Capillary: 92 mg/dL (ref 70–99)
Glucose-Capillary: 108 mg/dL — ABNORMAL HIGH (ref 70–99)
Glucose-Capillary: 122 mg/dL — ABNORMAL HIGH (ref 70–99)

## 2022-06-09 LAB — BASIC METABOLIC PANEL
Anion gap: 8 (ref 5–15)
BUN: 56 mg/dL — ABNORMAL HIGH (ref 6–20)
CO2: 25 mmol/L (ref 22–32)
Calcium: 9.2 mg/dL (ref 8.9–10.3)
Chloride: 104 mmol/L (ref 98–111)
Creatinine, Ser: 0.99 mg/dL (ref 0.44–1.00)
GFR, Estimated: >60 mL/min (ref >60)
Glucose, Bld: 82 mg/dL (ref 70–99)
Potassium: 4.7 mmol/L (ref 3.5–5.1)
Sodium: 137 mmol/L (ref 135–145)

## 2022-06-09 LAB — CBC
HCT: 22.3 % — ABNORMAL LOW (ref 36.0–46.0)
Hemoglobin: 7.4 g/dL — ABNORMAL LOW (ref 12.0–15.0)
MCH: 32.5 pg (ref 26.0–34.0)
MCHC: 33.2 g/dL (ref 30.0–36.0)
MCV: 97.8 fL (ref 80.0–100.0)
Platelets: 473 10*3/uL — ABNORMAL HIGH (ref 150–400)
RBC: 2.28 MIL/uL — ABNORMAL LOW (ref 3.87–5.11)
RDW: 17 % — ABNORMAL HIGH (ref 11.5–15.5)
WBC: 6.8 10*3/uL (ref 4.0–10.5)
nRBC: 0 % (ref 0.0–0.2)

## 2022-06-09 LAB — CULTURE, RESPIRATORY W GRAM STAIN: Culture: NORMAL

## 2022-06-09 LAB — MAGNESIUM: Magnesium: 2.1 mg/dL (ref 1.7–2.4)

## 2022-06-09 MED ORDER — FUROSEMIDE 10 MG/ML IJ SOLN: 40.0000 mg | Freq: Once | INTRAMUSCULAR | Status: DC

## 2022-06-09 NOTE — Progress Notes (Signed)
Patient Monica Hopkins      DOB: 1971-03-14      RCV:[id number]      Palliative Medicine Team    Subjective: Bedside symptom check completed. SLP, bedside RN, and patient's father bedside at time of visit.   Physical exam: Patient sitting up in bed at time of visit. Breathing even and non-labored, no excessive secretions noted. Patient currently tolerating PMV and trach collar without signs of distress.  Patient with physical signs of pain to include shifting, grimacing, guarding at this time. Patient endorses significant ongoing pain in neck, back, ribs, and wound on backside. Patient's tongue is beefy red with some edema noted. Much less white/patches today compared to reported assessment yesterday. Patient endorses continuing to have fluid feeling in her ears, but states that this has been ongoing for few months leading up to hospitalization and has been an issue in the past also, requiring draining. She understands that this may have to wait for more acute matters to resolve before being taken care of.     Assessment and plan: Assessment to be reported to NP following, Mariana Kaufman. Bedside RN without additional needs or concerns today, plan to move out of ICU if bed available. SLP reports plan to follow up with exam on Friday, to allow for strengthening. Patient is very thankful for ice chips to assist with pain in her mouth. Will continue to follow for any changes or advances.    Thank you for allowing the Palliative Medicine Team to assist in the care of this patient.     Damian Leavell, MSN, [place] Palliative Medicine Team Team Phone: [phone]  This phone is monitored 7a-7p, please reach out to attending physician outside of these hours for urgent needs.

## 2022-06-09 NOTE — Progress Notes (Addendum)
PROGRESS NOTE    Monica Hopkins  WFU:[id number] DOB: April 25, 1971 DOA: 05/19/2022 PCP: Leonard Downing, MD  Chief Complaint  Patient presents with   Cardiac Arrest    Brief Narrative:  Patient is Monica Hopkins 51 year old Caucasian female with Carlyann Placide past medical history significant for but limited to bipolar disorder who after cardiac arrest.  Per her family she had been feeling sick and taking trazodone and Benadryl at night.  They thought she had COVID Makaylyn Sinyard couple weeks ago and then recovered somewhat.  The last few weeks she has been short of breath intermittently with Ingri Diemer mild cough fevers and sore throat.  Her husband came home she is alert and talking and then he went about Audine Mangione minute about 10 minutes later he got back to find her unconscious.  He started CPR immediately and EMS came fairly quickly.  She is reportedly found in PEA arrest and was given 1 mg of epi as well as lidocaine.  Then subsequently she is found wants to be in Cecila Satcher shockable rhythm and was shocked and received ROSC after about 26 minutes while in route.  Per the family she is gone under Nury Nebergall large weight loss Daryle Boyington few years ago with unclear cause and has felt it was from the lithium but is unclear when she last taken this.  She has had poor p.o. intake from low appetite nausea vomiting and she apparently not wanted to go to the physician and had only recently done Delfina Schreurs outpatient follow-up with psychiatry.  Normally she sleeps in recliner.  In the ED when she was intubated and was hypothermic.  ABG was 7.206 with Jonus Coble PCO2 49 and O2 level of 82.  She was given IV vancomycin and cefepime and was noted to have AKI.  Home medications include Klonopin and trazodone.  Currently she was admitted and significant hospital events included   Significant Events 10/17: admitted post cardiac arrest; ROSC 26 minutes 10/20: off pressors, will awaken agitated, desaturates SBT.  10/23 failed extubation trial 10/25 trach 06/04/2022 she was transferred to [facility]  service 06/04/22 she desaturated on trach collar and PCCM was reconsulted. 06/05/22.  Continues to fail at swallowing and will need alternate means of feeding and she is resistant to PEG tube.  Consulted palliative care for further goals of care discussion and will also need Jimya Ciani psychiatric evaluation 06/06/22. rapid response called given desaturation due to L mucous plugging and small effusion 06/07/22. Transferred to ICU after recurrent saturation.  She was refusing chest vest, metaneb.   06/08/22. Tolerating trach collar, accepting meds, c/o pain 06/09/22  IR consulted for PEG  Assessment & Plan:   Principal Problem:   Cardiac arrest [facility]) Active Problems:   Pressure injury of skin   Protein-calorie malnutrition, severe   Pneumonia of both lungs due to infectious organism   Acute respiratory failure (HCC)   Mucus plug in respiratory tract   Pleural effusion   Assessment and Plan: S/p cardiac arrest: found to be in PEA/shockable rhythm, in the setting of hypoxia due to pneumococcal pneumonia   Acute hypoxic respiratory failure due to Strep pneumonia pneumonia, complicated with aspiration and healthcare associated pneumonia with Enterobacter, Klebsiella and stenotrophomonas  S/p trach Intermittent mucous plugging - currently on 10 L on trach collar  - poor cough mechanics, deconditioned, doing little with PT and mobility per PCCM.  Anxious and with rib pain reluctant to do CPT, including metaneb.   - trach care, trach collar - CPT, 3% saline nebs - continue to  reinforce - continue nebs - 10/23 BAL with klebsiella and enterobacter cloacae  - 10/28 tracheal aspirate with enterobacter cloacae and stenotrophomonas  - 11/4 tracheal aspirate with rare normal resp flora - continue bactrim 11/2 - present - repeat CXR 11/7 with interval improvement in bibasilar airspace disease and L effusion.  Persistent LLL consolidation and effusion.  - continue diuresis as tolerated given L sided effusion,  held today due to soft blood pressure (PCCM recommending attempt to diurese effusion prior to trying thora) -SLP working with Passy-Muir valve -SLP recommending NPO, repeat MBS at end of week, but per discussion with SLP, even if she's able to begin Shylin Keizer PO diet, expects Amyr Sluder g tube would be beneficial    New diagnosis of acute HFpEF  -echo with EF 55%, no RWMA, grade 1 diastolic dysfunction, d shaped septum suggesting RV pressure/volume overload -diuresis as tolerated as above  Severe protein calorie malnutrition -currently with cortrak -RD note from 10/31 recommends considering PEG.  Will place this orders, there are plans for another MBS, but per my discussion with SLP, they suspect Sherise Geerdes PEG will be beneficial even if she's able to begin Kyo Cocuzza PO diet  Acute septic/toxic encephalopathy -Mental status has significantly improved, she is extremely anxious at baseline    Acute rhabdomyolysis, resolved Hyperkalemia/Hypokalemia, resolved AKI due to septic ATN, rhabdomyolysis Hypernatremia Serum creatinine continues to improve Monitor intake and output   Anxiety depression bipolar  -appreciate psychiatry recommendations from 11/4 - seroquel 50 mg TID prn for agitation only, clonazepam 0.5 mg in the AM, 2 mg qhs, 0.5 mg BID prn.  Continue depakote 250 mg BID (this was later increased 500 mg BID).  Trazodone 50 mg qhs prn.   Rib fx from CPR - tylenol, dilaudid prn, gabapentin  Acute blood loss anemia on anemia of chronic disease -Patient hemoglobin has stabilized now -iron panel suggestive of chronic disease   Thrombocytosis -trend   Hypoalbuminemia - follow      DVT prophylaxis: heparin Code Status: full Family Communication: father at bedside Disposition:   Status is: Inpatient Remains inpatient appropriate because: continued need for inpt management   Consultants:  PCCM Psychiatry IR Palliative care  Procedures:  As above  Antimicrobials:  Anti-infectives (From admission,  onward)    Start     Dose/Rate Route Frequency Ordered Stop   06/08/22 1500  fluconazole (DIFLUCAN) tablet 200 mg        200 mg Per Tube Daily 06/08/22 1402     06/04/22 1045  sulfamethoxazole-trimethoprim (BACTRIM DS) 800-160 MG per tablet 2 tablet        2 tablet Oral Every 12 hours 06/04/22 0950     06/01/22 1030  meropenem (MERREM) 2 g in sodium chloride 0.9 % 100 mL IVPB  Status:  Discontinued        2 g 280 mL/hr over 30 Minutes Intravenous Every 12 hours 06/01/22 0933 06/04/22 0950   05/27/22 1600  piperacillin-tazobactam (ZOSYN) IVPB 3.375 g  Status:  Discontinued        3.375 g 12.5 mL/hr over 240 Minutes Intravenous Every 8 hours 05/27/22 1018 06/01/22 0930   05/27/22 1130  vancomycin (VANCOREADY) IVPB 1250 mg/250 mL  Status:  Discontinued        1,250 mg 166.7 mL/hr over 90 Minutes Intravenous Every 24 hours 05/27/22 1036 05/27/22 1037   05/27/22 1130  vancomycin (VANCOREADY) IVPB 1250 mg/250 mL  Status:  Discontinued        1,250 mg 166.7 mL/hr over 90 Minutes  Intravenous Every 48 hours 05/27/22 1037 05/28/22 0832   05/27/22 1115  vancomycin (VANCOCIN) IVPB 1000 mg/200 mL premix  Status:  Discontinued        1,000 mg 200 mL/hr over 60 Minutes Intravenous  Once 05/27/22 1018 05/27/22 1036   05/27/22 1115  piperacillin-tazobactam (ZOSYN) IVPB 3.375 g        3.375 g 100 mL/hr over 30 Minutes Intravenous  Once 05/27/22 1018 05/27/22 1119   05/20/22 2200  vancomycin (VANCOREADY) IVPB 750 mg/150 mL  Status:  Discontinued        750 mg 150 mL/hr over 60 Minutes Intravenous Every 24 hours 05/19/22 2158 05/20/22 1017   05/20/22 2000  vancomycin (VANCOCIN) IVPB 1000 mg/200 mL premix  Status:  Discontinued        1,000 mg 200 mL/hr over 60 Minutes Intravenous Every 24 hours 05/19/22 2107 05/19/22 2158   05/20/22 1115  cefTRIAXone (ROCEPHIN) 2 g in sodium chloride 0.9 % 100 mL IVPB        2 g 200 mL/hr over 30 Minutes Intravenous Every 24 hours 05/20/22 1017 05/25/22 1042   05/20/22  1000  ceFEPIme (MAXIPIME) 2 g in sodium chloride 0.9 % 100 mL IVPB  Status:  Discontinued        2 g 200 mL/hr over 30 Minutes Intravenous Every 12 hours 05/19/22 2107 05/20/22 1017   05/19/22 2200  vancomycin (VANCOREADY) IVPB 1250 mg/250 mL        1,250 mg 166.7 mL/hr over 90 Minutes Intravenous  Once 05/19/22 2158 05/20/22 0116   05/19/22 2100  ceFEPIme (MAXIPIME) 2 g in sodium chloride 0.9 % 100 mL IVPB        2 g 200 mL/hr over 30 Minutes Intravenous  Once 05/19/22 2048 05/19/22 2208   05/19/22 2100  vancomycin (VANCOREADY) IVPB 1500 mg/300 mL  Status:  Discontinued        1,500 mg 150 mL/hr over 120 Minutes Intravenous  Once 05/19/22 2048 05/19/22 2158      Subjective: Anxious, c/o rib pain   Objective: Vitals:   06/09/22 1631 06/09/22 1700 06/09/22 1800 06/09/22 1900  BP:  99/67 97/67 104/63  Pulse:  90 91 100  Resp:  19 14 (!) 23  Temp: 98.4 F (36.9 C)     TempSrc: Oral     SpO2:  99% 97% 92%  Weight:      Height:        Intake/Output Summary (Last 24 hours) at 06/09/2022 1922 Last data filed at 06/09/2022 1800 Gross per 24 hour  Intake 655 ml  Output 1150 ml  Net -495 ml   Filed Weights   06/03/22 0500 06/07/22 0338  Weight: 56.3 kg 74 kg    Examination:  General: No acute distress. Cardiovascular: RRR Lungs: diminished, trach Abdomen: Soft, nontender, nondistended  Neurological: Alert and oriented 3. Moves all extremities 4 . Cranial nerves II through XII grossly intact. Extremities: No clubbing or cyanosis. No edema  Data Reviewed: I have personally reviewed following labs and imaging studies  CBC: Recent Labs  Lab 06/04/22 1109 06/05/22 0106 06/06/22 1019 06/07/22 0140 06/08/22 0408 06/09/22 0406  WBC 10.9* 8.8 10.3 11.9* 6.0 6.8  NEUTROABS 7.9* 5.8 7.4 8.2*  --   --   HGB 7.9* 7.1* 7.7* 7.4* 7.1* 7.4*  HCT 24.4* 22.2* 24.0* 23.0* 21.9* 22.3*  MCV 96.4 98.2 95.6 99.6 97.8 97.8  PLT 546* 467* 501* 486* 446* 473*    Basic Metabolic  Panel: Recent Labs  Lab 06/04/22 0857 06/05/22 0106 06/06/22 1019 06/07/22 0140 06/08/22 0408 06/09/22 0406  NA 144 141 136 134* 139 137  K 3.7 3.5 3.7 4.2 4.5 4.7  CL 108 107 106 104 106 104  CO2 25 25 21* _0 GLUCOSE 101* 107* 137* 103* 94 82  BUN 62* 69* 58* 61* 50* 56*  CREATININE 1.05* 1.15* 1.19* 1.14* 1.00 0.99  CALCIUM 9.8 9.0 8.7* 8.7* 9.0 9.2  MG 1.9 1.8 1.7 1.8 2.4 2.1  PHOS 3.6 3.9 4.1 4.6  --   --     GFR: Estimated Creatinine Clearance: 69.2 mL/min (by C-G formula based on SCr of 0.99 mg/dL).  Liver Function Tests: Recent Labs  Lab 06/04/22 0857 06/05/22 0106 06/06/22 1019 06/07/22 0140  AST _1 ALT _2 ALKPHOS 63 57 62 63  BILITOT 0.4 0.4 0.3 0.4  PROT 6.1* 4.8* 5.5* 4.9*  ALBUMIN 2.2* 1.8* 2.1* 2.0*    CBG: Recent Labs  Lab 06/08/22 2319 06/09/22 0404 06/09/22 0807 06/09/22 1114 06/09/22 1633  GLUCAP 103* 78 119* 125* 92     Recent Results (from the past 240 hour(s))  Culture, Respiratory w Gram Stain     Status: None   Collection Time: 06/06/22  4:56 PM   Specimen: Tracheal Aspirate; Respiratory  Result Value Ref Range Status   Specimen Description TRACHEAL ASPIRATE  Final   Special Requests NONE  Final   Gram Stain   Final    ABUNDANT SQUAMOUS EPITHELIAL CELLS PRESENT RARE GRAM NEGATIVE RODS RARE GRAM POSITIVE RODS FEW WBC PRESENT, PREDOMINANTLY PMN    Culture   Final    RARE Normal respiratory flora-no Staph aureus or Pseudomonas seen Performed at [facility], [address] [place], [place] [address]    Report Status 06/09/2022 FINAL  Final         Radiology Studies: DG CHEST PORT 1 VIEW  Result Date: 06/09/2022 CLINICAL DATA:  Pneumonia EXAM: PORTABLE CHEST 1 VIEW COMPARISON:  06/07/2022 FINDINGS: Interval improvement in right lower lobe infiltrate Improvement in left lower lobe airspace disease and left effusion with persistent left lower lobe consolidation and effusion present.  Negative for heart failure Tracheostomy in good position. Feeding tube enters the stomach with the tip not visualized IMPRESSION: Interval improvement in bibasilar airspace disease and left effusion. Persistent left lower lobe consolidation and effusion. Electronically Signed   By: Franchot Gallo M.D.   On: 06/09/2022 14:04        Scheduled Meds:  acetaminophen (TYLENOL) oral liquid 160 mg/5 mL  650 mg Per Tube QID   albuterol  2.5 mg Nebulization Q4H   ascorbic acid  500 mg Per Tube BID   Chlorhexidine Gluconate Cloth  6 each Topical Daily   chlorpheniramine-HYDROcodone  5 mL Per Tube Q12H   clonazePAM  0.5 mg Per Tube Daily   clonazePAM  2 mg Per Tube QHS   docusate  100 mg Per Tube BID   famotidine  20 mg Per Tube BID   feeding supplement (VITAL AF 1.2 CAL)  1,000 mL Per Tube Q24H   fiber supplement (BANATROL TF)  60 mL Per Tube BID   fluconazole  200 mg Per Tube Daily   fluticasone  1 spray Each Nare Daily   folic acid  1 mg Per Tube Daily   gabapentin  300 mg Per Tube BID   heparin  5,000 Units Subcutaneous Q8H   insulin aspart  0-9  Units Subcutaneous Q4H   leptospermum manuka honey  1 Application Topical Daily   metoprolol tartrate  25 mg Per Tube Q8H   nutrition supplement (JUVEN)  1 packet Per Tube BID BM   mouth rinse  15 mL Mouth Rinse 4 times per day   polyethylene glycol  17 g Per Tube Daily   QUEtiapine  50 mg Oral TID   revefenacin  175 mcg Nebulization Daily   senna-docusate  1 tablet Oral QHS   sodium chloride HYPERTONIC  4 mL Nebulization BID   sulfamethoxazole-trimethoprim  2 tablet Oral Q12H   thiamine  100 mg Per Tube Daily   valproic acid  500 mg Per Tube BID   vitamin Emery Dupuy  10,000 Units Oral Daily   Continuous Infusions:  sodium chloride     sodium chloride Stopped (05/27/22 1640)     LOS: 21 days    Time spent: over 30 min    Fayrene Helper, MD [facility]   To contact the attending provider between 7A-7P or the covering provider  during after hours 7P-7A, please log into the web site [email or web address] and access using universal [place] password for that web site. If you do not have the password, please call the hospital operator.  06/09/2022, 7:22 PM

## 2022-06-09 NOTE — Progress Notes (Signed)
Speech Language Pathology Treatment: Dysphagia;Passy Muir Speaking valve  Patient Details Name: Monica Hopkins MRN: [id number] DOB: [birth year] Today's Date: 06/09/2022 Time: 9390-3009 SLP Time Calculation (min) (ACUTE ONLY): 63 min  Assessment / Plan / Recommendation Clinical Impression  Monica Hopkins was seen for speech/swallowing tx.  She is talking around her trach, getting enough airflow to generate voice but is more efficient with voice and cough when she uses her speaking valve.   SPO2 with and without valve was in the mid 90s today.  Volume of speech and intelligibility was improved.  Pt prefers not to wear valve - encouraged her to use it, especially when eating ice chips, to improve effectiveness of cough.  She had many questions about swallowing - we discussed last Friday's MBS and determined a plan to repeat the study this Friday.  She agreed to work on the two swallowing exercises that were taught to her.  Sign posted at foot of bed to remind her (Masako maneuver and effortful swallow).  She completed them with moderate visual/verbal cues.  A lot of time spent in discussion, providing encouragement, and repositioning to help with comfort. Upon leaving she asked, "Why does everyone always go when I calm down?"    HPI HPI: Pt is a 51 y.o. female admitted 05/19/22 after cardiac arrest at home, ROSC 26 min, hypothermic. ETT 10/17-10/23; reintubated 10/23-trach10/25. Pt with new HFpEF. MRI brain negative for acute changes. CXR 10/30: Stable multifocal pulmonary infiltrates and small left pleural effusion. PMH: bipolar disorder.      SLP Plan  Continue with current plan of care   1) use valve during waking hours with intermittent supervision. Remove before sleeping/napping. Ensure cuff is deflated.  2) NPO -continue ice chips when pt is using PMV 3) repeat MBS at end of this week   Recommendations for follow up therapy are one component of a multi-disciplinary discharge planning  process, led by the attending physician.  Recommendations may be updated based on patient status, additional functional criteria and insurance authorization.    Recommendations  Diet recommendations: NPO (ice chips) Medication Administration: Via alternative means      Patient may use Passy-Muir Speech Valve: During all therapies with supervision;During PO intake/meals PMSV Supervision: Intermittent         Oral Care Recommendations: Oral care prior to ice chip/H20 Assistance recommended at discharge: Frequent or constant Supervision/Assistance SLP Visit Diagnosis: [name] (R49.1);Dysphagia, oropharyngeal phase (R13.12) Plan: Continue with current plan of care       Monica Badolato L. Tivis Ringer, MA CCC/SLP Clinical Specialist - Acute Care SLP Acute Rehabilitation Services Office number [phone]     Juan Quam Laurice  06/09/2022, 11:39 AM

## 2022-06-09 NOTE — Progress Notes (Signed)
Inpatient Rehab Admissions Coordinator:   Consult received, chart reviewed. Therapies are in conflict with discharge recommendation. (PT rec CIR, OT rec for SNF.) Will await further therapy notes to determine potential for CIR admission. Continue to follow.   Rehab Admissons Coordinator [place], [place], [place] [phone]

## 2022-06-09 NOTE — Progress Notes (Addendum)
Occupational Therapy Treatment Patient Details Name: Monica Hopkins MRN: [id number] DOB: May 11, 1971 Today's Date: 06/09/2022   History of present illness Pt is a 51 y.o. female admitted 05/19/22 with cardiac arrest at home, ROSC 26 min, hypothermic. Intubated 10/17, failed extubation 10/23; trach placed 10/25. Pt with new HFpEF. PMH includes bipolar disorder.   OT comments  Monica Hopkins is making great progress with notable improvements in bed mobility, sitting balance, communication and functional transfers. Overall she required mod A for bed mobility, min G for sitting balance and mod A to stand and take pivotal steps. Pt is anxious for all movement and benefits from simple 1 step cues for all initiation, sequencing and problem solving. She continues to be limited by activity tolerance. RR to 36, HR in the 90s, and O2 stable on 10L. Due to motivation, age and acute progress d/c recommendation updated to AIR.    Recommendations for follow up therapy are one component of a multi-disciplinary discharge planning process, led by the attending physician.  Recommendations may be updated based on patient status, additional functional criteria and insurance authorization.    Follow Up Recommendations  Acute inpatient rehab (3hours/day)    Assistance Recommended at Discharge Frequent or constant Supervision/Assistance  Patient can return home with the following  Two people to help with walking and/or transfers;A lot of help with bathing/dressing/bathroom;Assistance with cooking/housework;Direct supervision/assist for medications management;Direct supervision/assist for financial management;Assist for transportation;Help with stairs or ramp for entrance   Equipment Recommendations  Other (comment)    Recommendations for Other Services Rehab consult    Precautions / Restrictions Precautions Precautions: Fall;Other (comment) Precaution Comments: cortrak, trach collar, multiple wounds along spine and  sacrum Restrictions Weight Bearing Restrictions: No       Mobility Bed Mobility Overal bed mobility: Needs Assistance Bed Mobility: Rolling, Sidelying to Sit Rolling: Mod assist Sidelying to sit: Mod assist       General bed mobility comments: needs cues for all initiation and sequencing    Transfers Overall transfer level: Needs assistance Equipment used: 1 person hand held assist   Sit to Stand: Min assist     Step pivot transfers: Mod assist     General transfer comment: Transferred pt from air bed to ICU bed with step pivot, cues needed     Balance Overall balance assessment: Needs assistance Sitting-balance support: Feet supported Sitting balance-Leahy Scale: Fair Sitting balance - Comments: able to statically sit with mni G   Standing balance support: Bilateral upper extremity supported Standing balance-Leahy Scale: Poor                             ADL either performed or assessed with clinical judgement   ADL Overall ADL's : Needs assistance/impaired                         Toilet Transfer: Moderate assistance;Stand-pivot Toilet Transfer Details (indicate cue type and reason): simulated         Functional mobility during ADLs: Moderate assistance General ADL Comments: limited by activity tolerance. improvement noted in sitting balance and strength this date    Extremity/Trunk Assessment Upper Extremity Assessment Upper Extremity Assessment: RUE deficits/detail;LUE deficits/detail RUE Deficits / Details: 2/5 shoulder, 3/5 elbow, 4/5 gross grip LUE Deficits / Details: 2/5 shoulder, 3/5 elbow, 3+/5 gross grip   Lower Extremity Assessment Lower Extremity Assessment: Defer to PT evaluation        Vision  Vision Assessment?: No apparent visual deficits   Perception Perception Perception: Not tested   Praxis Praxis Praxis: Not tested    Cognition Arousal/Alertness: Awake/alert Behavior During Therapy: Anxious Overall  Cognitive Status: Impaired/Different from baseline Area of Impairment: Attention, Memory, Awareness, Problem solving, Following commands, Safety/judgement                   Current Attention Level: Selective Memory: Decreased short-term memory Following Commands: Follows one step commands with increased time, Follows one step commands consistently Safety/Judgement: Decreased awareness of deficits, Decreased awareness of safety Awareness: Emergent Problem Solving: Slow processing, Difficulty sequencing, Requires verbal cues General Comments: continues to be very anxious, requires simple commands and encouragement              General Comments VSS on 10L O2 via trach. Assisted RNs with bed change    Pertinent Vitals/ Pain       Pain Assessment Pain Assessment: Faces Faces Pain Scale: Hurts a little bit Pain Location: generalized with movement Pain Descriptors / Indicators: Discomfort, Grimacing Pain Intervention(s): Limited activity within patient's tolerance, Monitored during session   Frequency  Min 2X/week        Progress Toward Goals  OT Goals(current goals can now be found in the care plan section)  Progress towards OT goals: Progressing toward goals  Acute Rehab OT Goals OT Goal Formulation: With patient Time For Goal Achievement: 06/12/22 Potential to Achieve Goals: Good ADL Goals Pt Will Perform Upper Body Dressing: with min assist;sitting Pt Will Transfer to Toilet: with mod assist;stand pivot transfer;bedside commode Pt/caregiver will Perform Home Exercise Program: Both right and left upper extremity;Increased strength;With minimal assist Additional ADL Goal #1: Pt will demonstrate fair sitting balance at EOB. Additional ADL Goal #2: Pt will demonstrate selective attention.  Plan Discharge plan needs to be updated       AM-PAC OT "6 Clicks" Daily Activity     Outcome Measure   Help from another person eating meals?: Total Help from another person  taking care of personal grooming?: A Lot Help from another person toileting, which includes using toliet, bedpan, or urinal?: A Lot Help from another person bathing (including washing, rinsing, drying)?: A Lot Help from another person to put on and taking off regular upper body clothing?: A Lot Help from another person to put on and taking off regular lower body clothing?: Total 6 Click Score: 10    End of Session Equipment Utilized During Treatment: Oxygen  OT Visit Diagnosis: Muscle weakness (generalized) (M62.81);Other symptoms and signs involving cognitive function;Pain   Activity Tolerance Patient tolerated treatment well   Patient Left in bed;with call bell/phone within reach;with nursing/sitter in room   Nurse Communication Mobility status        Time: 1549-1610 OT Time Calculation (min): 21 min  Charges: OT General Charges $OT Visit: 1 Visit OT Treatments $Therapeutic Activity: 8-22 mins  Elliot Cousin 06/09/2022, 4:23 PM

## 2022-06-10 ENCOUNTER — Inpatient Hospital Stay (HOSPITAL_COMMUNITY): Payer: Medicaid Other

## 2022-06-10 DIAGNOSIS — Z930 Tracheostomy status: Secondary | ICD-10-CM

## 2022-06-10 LAB — CBC WITH DIFFERENTIAL/PLATELET
Abs Immature Granulocytes: 0.03 10*3/uL (ref 0.00–0.07)
Basophils Absolute: 0.1 10*3/uL (ref 0.0–0.1)
Basophils Relative: 1 %
Eosinophils Absolute: 0.2 10*3/uL (ref 0.0–0.5)
Eosinophils Relative: 3 %
HCT: 22.7 % — ABNORMAL LOW (ref 36.0–46.0)
Hemoglobin: 7.2 g/dL — ABNORMAL LOW (ref 12.0–15.0)
Immature Granulocytes: 0 %
Lymphocytes Relative: 29 %
Lymphs Abs: 2.2 10*3/uL (ref 0.7–4.0)
MCH: 31.9 pg (ref 26.0–34.0)
MCHC: 31.7 g/dL (ref 30.0–36.0)
MCV: 100.4 fL — ABNORMAL HIGH (ref 80.0–100.0)
Monocytes Absolute: 0.6 10*3/uL (ref 0.1–1.0)
Monocytes Relative: 8 %
Neutro Abs: 4.2 10*3/uL (ref 1.7–7.7)
Neutrophils Relative %: 59 %
Platelets: 455 10*3/uL — ABNORMAL HIGH (ref 150–400)
RBC: 2.26 MIL/uL — ABNORMAL LOW (ref 3.87–5.11)
RDW: 17.5 % — ABNORMAL HIGH (ref 11.5–15.5)
WBC: 7.4 10*3/uL (ref 4.0–10.5)
nRBC: 0 % (ref 0.0–0.2)

## 2022-06-10 LAB — GLUCOSE, CAPILLARY
Glucose-Capillary: 90 mg/dL (ref 70–99)
Glucose-Capillary: 122 mg/dL — ABNORMAL HIGH (ref 70–99)
Glucose-Capillary: 111 mg/dL — ABNORMAL HIGH (ref 70–99)
Glucose-Capillary: 117 mg/dL — ABNORMAL HIGH (ref 70–99)

## 2022-06-10 LAB — COMPREHENSIVE METABOLIC PANEL
ALT: 16 U/L (ref 0–44)
AST: 23 U/L (ref 15–41)
Albumin: 2.5 g/dL — ABNORMAL LOW (ref 3.5–5.0)
Alkaline Phosphatase: 64 U/L (ref 38–126)
Anion gap: 7 (ref 5–15)
BUN: 57 mg/dL — ABNORMAL HIGH (ref 6–20)
CO2: 26 mmol/L (ref 22–32)
Calcium: 9.5 mg/dL (ref 8.9–10.3)
Chloride: 105 mmol/L (ref 98–111)
Creatinine, Ser: 1 mg/dL (ref 0.44–1.00)
GFR, Estimated: >60 mL/min (ref >60)
Glucose, Bld: 104 mg/dL — ABNORMAL HIGH (ref 70–99)
Potassium: 5.1 mmol/L (ref 3.5–5.1)
Sodium: 138 mmol/L (ref 135–145)
Total Bilirubin: 0.4 mg/dL (ref 0.3–1.2)
Total Protein: 6 g/dL — ABNORMAL LOW (ref 6.5–8.1)

## 2022-06-10 LAB — PHOSPHORUS: Phosphorus: 3.6 mg/dL (ref 2.5–4.6)

## 2022-06-10 LAB — MAGNESIUM: Magnesium: 2 mg/dL (ref 1.7–2.4)

## 2022-06-10 MED ORDER — LIDOCAINE HCL URETHRAL/MUCOSAL 2 % EX GEL
1.0000 | Freq: Once | CUTANEOUS | Status: AC
  Administered 2022-06-11: 1 via TOPICAL
  Filled 2022-06-10: qty 6

## 2022-06-10 MED ORDER — LIDOCAINE 5 % EX PTCH
1.0000 | MEDICATED_PATCH | CUTANEOUS | Status: DC
  Administered 2022-06-10 – 2022-06-25 (×16): 1 via TRANSDERMAL
  Filled 2022-06-10 (×16): qty 1

## 2022-06-10 MED ORDER — HYDROMORPHONE HCL 2 MG PO TABS
2.0000 mg | ORAL_TABLET | ORAL | Status: DC | PRN
  Administered 2022-06-10 – 2022-06-11 (×2): 4 mg
  Administered 2022-06-11: 2 mg
  Administered 2022-06-11 – 2022-06-15 (×13): 4 mg
  Administered 2022-06-16: 2 mg
  Administered 2022-06-16: 4 mg
  Administered 2022-06-16: 2 mg
  Administered 2022-06-17 (×2): 4 mg
  Administered 2022-06-17: 2 mg
  Administered 2022-06-17 – 2022-06-19 (×12): 4 mg
  Administered 2022-06-20: 2 mg
  Administered 2022-06-20: 4 mg
  Administered 2022-06-20: 2 mg
  Administered 2022-06-20 – 2022-06-21 (×3): 4 mg
  Administered 2022-06-21: 2 mg
  Administered 2022-06-21: 4 mg
  Administered 2022-06-21 – 2022-06-23 (×6): 2 mg
  Administered 2022-06-23: 4 mg
  Administered 2022-06-23: 2 mg
  Filled 2022-06-10 (×6): qty 2
  Filled 2022-06-10 (×2): qty 1
  Filled 2022-06-10 (×5): qty 2
  Filled 2022-06-10: qty 1
  Filled 2022-06-10 (×4): qty 2
  Filled 2022-06-10: qty 1
  Filled 2022-06-10: qty 2
  Filled 2022-06-10: qty 1
  Filled 2022-06-10 (×9): qty 2
  Filled 2022-06-10: qty 1
  Filled 2022-06-10 (×3): qty 2
  Filled 2022-06-10 (×2): qty 1
  Filled 2022-06-10: qty 2
  Filled 2022-06-10: qty 1
  Filled 2022-06-10 (×2): qty 2
  Filled 2022-06-10: qty 1
  Filled 2022-06-10 (×2): qty 2
  Filled 2022-06-10: qty 1
  Filled 2022-06-10 (×9): qty 2

## 2022-06-10 MED ORDER — BANATROL TF EN LIQD
60.0000 mL | Freq: Three times a day (TID) | ENTERAL | Status: DC
  Administered 2022-06-10 – 2022-06-17 (×21): 60 mL
  Filled 2022-06-10 (×24): qty 60

## 2022-06-10 MED ORDER — ALBUTEROL SULFATE (2.5 MG/3ML) 0.083% IN NEBU
2.5000 mg | INHALATION_SOLUTION | Freq: Three times a day (TID) | RESPIRATORY_TRACT | Status: DC
  Administered 2022-06-10 – 2022-06-16 (×16): 2.5 mg via RESPIRATORY_TRACT
  Filled 2022-06-10 (×18): qty 3

## 2022-06-10 MED ORDER — VITAL AF 1.2 CAL PO LIQD
1000.0000 mL | ORAL | Status: DC
  Administered 2022-06-10 – 2022-06-19 (×10): 1000 mL
  Filled 2022-06-10 (×16): qty 1000

## 2022-06-10 MED ORDER — LIDOCAINE HCL URETHRAL/MUCOSAL 2 % EX GEL
1.0000 | Freq: Once | CUTANEOUS | Status: DC
  Filled 2022-06-10: qty 6

## 2022-06-10 MED ORDER — COLLAGENASE 250 UNIT/GM EX OINT
TOPICAL_OINTMENT | Freq: Every day | CUTANEOUS | Status: DC
  Filled 2022-06-10 (×2): qty 30

## 2022-06-10 MED ORDER — HYDROMORPHONE HCL 1 MG/ML IJ SOLN
0.5000 mg | INTRAMUSCULAR | Status: DC | PRN
  Administered 2022-06-10 – 2022-06-24 (×42): 0.5 mg via INTRAVENOUS
  Filled 2022-06-10 (×42): qty 0.5

## 2022-06-10 NOTE — Progress Notes (Signed)
IP rehab admissions - I met with patient and her parents.  I gave patient rehab booklets and I tried to explain inpatient rehab.  Parents are willing to take patient to their home, but not until patient can be up and around on her on.  Patient became angry and the was yelling at mom.  Argument went on between parents and patient.  I excused myself and will try again later.  Patient today was unpleasant, angry and argumentative towards me.  Patient has no insurance per her report.  Parents say she has applied for medicaid and disability.  I will see patient again tomorrow.  Call me for questions.  [phone]

## 2022-06-10 NOTE — Progress Notes (Signed)
PT Cancellation Note  Patient Details Name: Monica Hopkins MRN: [id number] DOB: 02/09/1971   Cancelled Treatment:    Reason Eval/Treat Not Completed: Fatigue/lethargy limiting ability to participate. Pt recently transferred from [facility] and notably worked up about this when switching rooms; pt received asleep and unhappy to be woken up. Pt declines PT treatment this afternoon, but agreeable to work on ambulation tomorrow, reports preference for AM.  Ina Homes, PT, DPT Acute Rehabilitation Services  Personal: Secure Chat Rehab Office: [phone]  Malachy Chamber 06/10/2022, 4:24 PM

## 2022-06-10 NOTE — Progress Notes (Signed)
Progress Note   Patient: Monica Hopkins ASN:[id number] DOB: May 26, 1971 DOA: 05/19/2022     22 DOS: the patient was seen and examined on 06/10/2022   Brief hospital course: 51 year old presented after cardiac arrest.  Found to be hypothermic and intubated in the emergency department. Significant Events 10/17: admitted post cardiac arrest; ROSC 26 minutes 10/20: off pressors, will awaken agitated, desaturates SBT.  10/23 failed extubation trial 10/25 trach 06/04/2022 she was transferred to the [facility] service 06/04/22 she desaturated on trach collar and PCCM was reconsulted. 06/05/22.  Continues to fail at swallowing and will need alternate means of feeding and she is resistant to PEG tube.  Consulted palliative care for further goals of care discussion and will also need a psychiatric evaluation 06/06/22. rapid response called given desaturation due to L mucous plugging and small effusion 06/07/22. Transferred to ICU after recurrent saturation.  She was refusing chest vest, metaneb.   06/08/22. Tolerating trach collar, accepting meds, c/o pain 06/09/22  IR consulted for PEG -i  Assessment and Plan: S/p cardiac arrest found to be in PEA/shockable rhythm, in the setting of hypoxia due to pneumococcal pneumonia    Acute hypoxic respiratory failure due to Strep pneumonia pneumonia, complicated with aspiration and healthcare associated pneumonia with Enterobacter, Klebsiella and stenotrophomonas  --S/p trach, Intermittent mucous plugging --trach collar. Poor cough mechanics, deconditioned, doing little with PT and mobility per PCCM.  Anxious and with rib pain reluctant to do CPT, including metaneb.   --continue nebs --10/23 BAL with klebsiella and enterobacter cloacae, 10/28 tracheal aspirate with enterobacter cloacae and stenotrophomonas, 11/4 tracheal aspirate with rare normal resp flora --continue bactrim 11/2 - present - repeat CXR 11/7 with interval improvement in bibasilar airspace disease and L  effusion.  Persistent LLL consolidation and effusion.  - continue diuresis as tolerated given L sided effusion, held today due to soft blood pressure (PCCM recommending attempt to diurese effusion prior to trying thora) -SLP working with Passy-Muir valve -SLP recommending NPO, repeat MBS at end of week, but per discussion with SLP, even if she's able to begin a PO diet, expects a g tube would be beneficial    New diagnosis of acute HFpEF  --echo with EF 55%, no RWMA, grade 1 diastolic dysfunction, d shaped septum suggesting RV pressure/volume overload -diuresis as tolerated    Severe protein calorie malnutrition --currently with cortrak --PEG planned   Acute septic/toxic encephalopathy --appears resolved   Acute rhabdomyolysis, resolved Hyperkalemia/Hypokalemia, resolved AKI due to septic ATN, rhabdomyolysis Hypernatremia --Creatinine stable   Anxiety depression bipolar  --psychiatry recommendations from 11/4 --seroquel 50 mg TID prn for agitation only, clonazepam 0.5 mg in the AM, 2 mg qhs, 0.5 mg BID prn.  Continue depakote 250 mg BID (this was later increased 500 mg BID).  Trazodone 50 mg qhs prn.   Rib fx from CPR --tylenol, dilaudid prn, gabapentin   Acute blood loss anemia on anemia of chronic disease --Patient hemoglobin has stabilized now --iron panel suggestive of chronic disease   Thrombocytosis -trend   Hypoalbuminemia - follow        Subjective:  Reports rib pain Breathing ok Generally sore all over  Physical Exam: Vitals:   06/10/22 0844 06/10/22 0850 06/10/22 0851 06/10/22 0900  BP:    96/67  Pulse:   86 86  Resp:   17 (!) 29  Temp:      TempSrc:      SpO2: 96% 100% 100% 99%  Weight:      Height:  Physical Exam Vitals reviewed.  Constitutional:      General: She is not in acute distress.    Appearance: She is not ill-appearing or toxic-appearing.  Neck:     Comments: Trach collar Cardiovascular:     Rate and Rhythm: Normal rate  and regular rhythm.     Heart sounds: No murmur heard. Pulmonary:     Effort: Pulmonary effort is normal. No respiratory distress.     Breath sounds: No wheezing, rhonchi or rales.  Musculoskeletal:     Right lower leg: No edema.     Left lower leg: No edema.  Neurological:     Mental Status: She is alert.  Psychiatric:        Mood and Affect: Mood normal.        Behavior: Behavior normal.     Data Reviewed: CBG stable Hgb stable 7.2  Family Communication:   Disposition: Status is: Inpatient Remains inpatient appropriate because: needs PEG  Planned Discharge Destination:  TBD    Time spent: 35 minutes  Author: Murray Hodgkins, MD 06/10/2022 9:53 AM  For on call review [email or web address].

## 2022-06-10 NOTE — Progress Notes (Signed)
Pt refused metaneb due to pain at this time.
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
## 2022-06-10 NOTE — Progress Notes (Signed)
  Request for Percutaneous Gastrostomy seen.   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   Anatomy is highly unfavorable and a relative contraindication for attempt at percutaneous gastrostomy tube placement.   The colon is also relatively dilated and contains dilute oral contrast likely reflecting some degree of ileus.    Recommend general surgical consultation if long-term nutrition is still a need for consideration of surgical gastrostomy versus jejunostomy.  Paulla Mcclaskey S Abbygael Curtiss PA-C 06/10/2022 1:10 PM

## 2022-06-10 NOTE — Progress Notes (Signed)
   06/10/22 1546  Vitals  Temp 98.2 F (36.8 C)  Temp Source Oral  BP 115/74  MAP (mmHg) 87  BP Location Left Arm  BP Method Automatic  Patient Position (if appropriate) Sitting  Pulse Rate 98  Pulse Rate Source Monitor  ECG Heart Rate 96  Resp 18  Level of Consciousness  Level of Consciousness Alert  MEWS COLOR  MEWS Score Color Green  Oxygen Therapy  SpO2 96 %  O2 Device Tracheostomy Collar  O2 Flow Rate (L/min) 10 L/min  FiO2 (%) 40 %  Patient Activity (if Appropriate) In bed  Pulse Oximetry Type Continuous  Pain Assessment  Pain Scale 0-10  Pain Score 10  Glasgow Coma Scale  Eye Opening 4  Best Verbal Response (NON-intubated) 5  Best Motor Response 6  Glasgow Coma Scale Score 15  MEWS Score  MEWS Temp 0  MEWS Systolic 0  MEWS Pulse 0  MEWS RR 0  MEWS LOC 0  MEWS Score 0   Pt arrived to 4E09 10/10 pain Pt on telebox and CCMD notified Pt with call bell within reach, bed alarm on and pt sitting up @30  degrees

## 2022-06-10 NOTE — Hospital Course (Addendum)
51 year old presented after cardiac arrest.  Found to be hypothermic and intubated in the emergency department. Significant Events 10/17: admitted post cardiac arrest; ROSC 26 minutes 10/20: off pressors, will awaken agitated, desaturates SBT.  10/23 failed extubation trial 10/25 trach 06/04/2022 she was transferred to the [facility] service 06/04/22 she desaturated on trach collar and PCCM was reconsulted. 06/05/22.  Continues to fail at swallowing and will need alternate means of feeding and she is resistant to PEG tube.  Consulted palliative care for further goals of care discussion and will also need a psychiatric evaluation 06/06/22. rapid response called given desaturation due to L mucous plugging and small effusion 06/07/22. Transferred to ICU after recurrent saturation.  She was refusing chest vest, metaneb.   06/08/22. Tolerating trach collar, accepting meds, c/o pain 06/09/22  IR consulted for PEG, unable to secondary to anatomy.  General surgery recommending conservative management, core track, diet as tolerated. 11/10 started on liquid diet.  Did poorly the first day, was noncompliant. 11/11 n.p.o. given poor compliance with proper diet, respiratory status decline, made NPO 11/12-13 tolerating liquid diet
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
## 2022-06-10 NOTE — Consult Note (Addendum)
WOC Nurse Re-consult Note: Reason for Consult:Refer to previous WOC consult note on 10/18.  Medihoney was ordered at that time for Sacrum pressure injury.  Pt requests that this topical treatment be discontinued and another one applied.  She stated "it is not working, and it is painful." Pt is critically ill and very emaciated.  She has multiple systemic factors which can impair healing.  Discussed with her that she is very thin and that is why the sacrum wound is so painful, and her poor nutrition prior to admission has impacted wound healing.  She is currently on an air mattress to reduce pressure.  She requests that "some type of numbing spray be applied with dressing changes." Informed her we do not carry that topical treatment in the [facility] formulary.  Secure chat message sent to the primary physician to request that he order Lidocaine gel to apply with dressing changes, if he agrees with this plan of care.  Wound type: Sacrum with Unstageable pressure injury, noted as present on admission, 3X4cm, 100% slough/eschar, small amt tan drainage.  Pressure Injury POA: Yes Dressing procedure/placement/frequency: I will discontinue the Medihoney as she requests and order Santyl to provide enzymatic debridement to assist with removal of nonviable tissue.   Topical treatment orders provided for bedside nurses to perform as follows: Give Lidocaine gel with dressing changes. Apply Santyl to open wounds along spine and sacrum, cover with foam dressing.  Change foam dressing Q 3 days or PRN soiling.  Please re-consult if further assistance is needed.  Thank-you,  Cammie Mcgee MSN, RN, CWOCN, Edie, CNS [phone]

## 2022-06-11 DIAGNOSIS — R1310 Dysphagia, unspecified: Secondary | ICD-10-CM

## 2022-06-11 DIAGNOSIS — Z430 Encounter for attention to tracheostomy: Secondary | ICD-10-CM

## 2022-06-11 LAB — GLUCOSE, CAPILLARY
Glucose-Capillary: 112 mg/dL — ABNORMAL HIGH (ref 70–99)
Glucose-Capillary: 119 mg/dL — ABNORMAL HIGH (ref 70–99)
Glucose-Capillary: 88 mg/dL (ref 70–99)
Glucose-Capillary: 94 mg/dL (ref 70–99)

## 2022-06-11 LAB — BASIC METABOLIC PANEL
Anion gap: 9 (ref 5–15)
BUN: 55 mg/dL — ABNORMAL HIGH (ref 6–20)
CO2: 26 mmol/L (ref 22–32)
Calcium: 9.8 mg/dL (ref 8.9–10.3)
Chloride: 104 mmol/L (ref 98–111)
Creatinine, Ser: 0.96 mg/dL (ref 0.44–1.00)
GFR, Estimated: >60 mL/min (ref >60)
Glucose, Bld: 111 mg/dL — ABNORMAL HIGH (ref 70–99)
Potassium: 4.7 mmol/L (ref 3.5–5.1)
Sodium: 139 mmol/L (ref 135–145)

## 2022-06-11 NOTE — Progress Notes (Signed)
Progress Note   Patient: Monica Hopkins KGM:[id number] DOB: [birth year] DOA: 05/19/2022     23 DOS: the patient was seen and examined on 06/11/2022   Brief hospital course: 51 year old presented after cardiac arrest.  Found to be hypothermic and intubated in the emergency department. Significant Events 10/17: admitted post cardiac arrest; ROSC 26 minutes 10/20: off pressors, will awaken agitated, desaturates SBT.  10/23 failed extubation trial 10/25 trach 06/04/2022 she was transferred to the [facility] service 06/04/22 she desaturated on trach collar and PCCM was reconsulted. 06/05/22.  Continues to fail at swallowing and will need alternate means of feeding and she is resistant to PEG tube.  Consulted palliative care for further goals of care discussion and will also need a psychiatric evaluation 06/06/22. rapid response called given desaturation due to L mucous plugging and small effusion 06/07/22. Transferred to ICU after recurrent saturation.  She was refusing chest vest, metaneb.   06/08/22. Tolerating trach collar, accepting meds, c/o pain 06/09/22  IR consulted for PEG -i  Assessment and Plan: S/p cardiac arrest found to be in PEA/shockable rhythm, in the setting of hypoxia due to pneumococcal pneumonia    Acute hypoxic respiratory failure due to Strep pneumonia pneumonia, complicated with aspiration and healthcare associated pneumonia with Enterobacter, Klebsiella and stenotrophomonas  --S/p trach, Intermittent mucous plugging --trach collar. Poor cough mechanics, deconditioned, doing little with PT and mobility per PCCM.  Anxious and with rib pain reluctant to do CPT, including metaneb.   --continue nebs --10/23 BAL with klebsiella and enterobacter cloacae, 10/28 tracheal aspirate with enterobacter cloacae and stenotrophomonas, 11/4 tracheal aspirate with rare normal resp flora --continue bactrim 11/2 - present, complete 2 weeks - repeat CXR 11/7 with interval improvement in bibasilar  airspace disease and L effusion.  Persistent LLL consolidation and effusion.  - continue diuresis as tolerated given L sided effusion, held today due to soft blood pressure (PCCM recommending attempt to diurese effusion prior to trying thora) -SLP working with Passy-Muir valve -SLP recommending NPO, repeat MBS at end of week, but per discussion with SLP, even if she's able to begin a PO diet, expects a g tube would be beneficial, however patient not a candidate for percutaneous PEG. Would require open procedure. Will defer unless fails MBS tomorrow.   New diagnosis of acute HFpEF  --echo with EF 55%, no RWMA, grade 1 diastolic dysfunction, d shaped septum suggesting RV pressure/volume overload -diuresis as tolerated    Severe protein calorie malnutrition --currently with cortrak --PEG considered, but would require open procedure. Hold pending MBS 11/10.   Acute septic/toxic encephalopathy --resolved   Acute rhabdomyolysis, resolved Hyperkalemia/Hypokalemia, resolved AKI due to septic ATN, rhabdomyolysis Hypernatremia --Creatinine stable   Anxiety depression bipolar  --psychiatry recommendations from 11/4 --seroquel 50 mg TID prn for agitation only, clonazepam 0.5 mg in the AM, 2 mg qhs, 0.5 mg BID prn.  Continue depakote 250 mg BID (this was later increased 500 mg BID).  Trazodone 50 mg qhs prn.   Rib fx from CPR --Tylenol, dilaudid prn, gabapentin   Acute blood loss anemia on anemia of chronic disease --Patient hemoglobin has stabilized now --iron panel suggestive of chronic disease   Thrombocytosis -trend   Hypoalbuminemia - follow        Subjective:  Feels anxious "I don't know what is going on, why can't I eat?"  Physical Exam: Vitals:   06/11/22 0848 06/11/22 1116 06/11/22 1124 06/11/22 1207  BP: 118/76 118/76 105/70 104/70  Pulse: (!) 102 84 94 90  Resp:  _0 Temp:   98.4 F (36.9 C)   TempSrc:   Oral   SpO2: 98% 99% 92%   Weight:      Height:        Physical Exam Vitals reviewed.  Constitutional:      General: She is not in acute distress.    Appearance: She is not ill-appearing or toxic-appearing.  Cardiovascular:     Rate and Rhythm: Normal rate and regular rhythm.     Heart sounds: No murmur heard. Pulmonary:     Effort: Pulmonary effort is normal. No respiratory distress.     Breath sounds: No wheezing, rhonchi or rales.  Musculoskeletal:     Right lower leg: No edema.     Left lower leg: No edema.  Neurological:     Mental Status: She is alert.  Psychiatric:        Mood and Affect: Mood is anxious.        Behavior: Behavior is agitated.     Data Reviewed: CBG stable BMP noted  Family Communication: niece  Disposition: Status is: Inpatient Remains inpatient appropriate because: needs alternate feeding  Planned Discharge Destination:  [facility]    Time spent: 20 minutes  Author: Murray Hodgkins, MD 06/11/2022 1:46 PM  For on call review [email or web address].

## 2022-06-11 NOTE — PMR Pre-admission (Shared)
PMR Admission Coordinator Pre-Admission Assessment  Patient: Monica Hopkins is an 51 y.o., female MRN: [id number] DOB: 12-Nov-1970 Height: _0  (167.6 cm) Weight: 49.4 kg  Insurance Information Self pay, uninsured, let her obama care plan expire and did not change to a new plan Medicaid pending    The "Data Collection Information Summary" for patients in Inpatient Rehabilitation Facilities with attached "[place] Records" was provided and verbally reviewed with: N/A  Emergency Contact Information Contact Information     Name Relation Home Work Mobile   Smith,[name] Mother   [phone]   Smith,Nelson Other   [phone]   Emilio Math [phone]  [phone]   Alonza Bogus Daughter   [phone]       Current Medical History  Patient 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  Per her family she had been feeling sick and taking trazodone and Benadryl at night.  They thought she had COVID a couple weeks ago and then recovered somewhat.  The last few weeks she has been short of breath intermittently with a mild cough fevers and sore throat.  When her husband came home she was alert and talking and then he went out of the room.  About 10 minutes later he got back to find her unconscious.  He started CPR immediately and EMS came fairly quickly.  She was reportedly found in PEA arrest and was given 1 mg of epi as well as lidocaine.  Then subsequently she was found  to be in a shockable rhythm and was shocked and received ROSC after about 26 minutes while in route.  Per the family she has under gone  a large weight loss a few years ago with unclear cause and has felt it was from the lithium but is unclear when she last taken this.  She has had poor p.o. intake from low appetite nausea  vomiting and she apparently has not wanted to go to the physician and had only recently done an outpatient follow-up with psychiatry. While in the ICU a trach was placed on 05/27/22.  Currently has a cortrak for tube feedings.  Palliative care was consulted for goals of care.  A psychiatric evaluation was completed.  IR was consulted on 06/09/22 for possible PEG placement.  PT/OT/SLP evaluations completed with recommendations for acute inpatient rehab admission.    Patient's medical record from [facility] has been reviewed by the rehabilitation admission coordinator and physician.  Past Medical History  Past Medical History:  Diagnosis Date   Prolapse, disk     Has the patient had major surgery during 100 days prior to admission? Yes  Family History   family history includes Mental illness in her mother and sister.  Current Medications  Current Facility-Administered Medications:    0.9 %  sodium chloride infusion, 250 mL, Intravenous, Continuous, Meier, Hortencia Conradi, MD   0.9 %  sodium chloride infusion, 250 mL, Intravenous, Continuous, Meier, Hortencia Conradi, MD, Stopped at 05/27/22 1640   acetaminophen (TYLENOL) tablet 650 mg, 650 mg, Oral, Q4H PRN, 650 mg at 06/04/22 0236 **OR** acetaminophen (TYLENOL) 160 MG/5ML solution 650 mg, 650 mg, Per Tube, Q4H PRN, 650 mg at 06/05/22 1815 **OR** acetaminophen (TYLENOL) suppository 650 mg, 650 mg, Rectal, Q4H PRN, Maryjane Hurter, MD   acetaminophen (TYLENOL) 160 MG/5ML solution 650 mg, 650 mg, Per Tube, QID, Maryjane Hurter,  MD, 650 mg at 06/11/22 1634   albuterol (PROVENTIL) (2.5 MG/3ML) 0.083% nebulizer solution 2.5 mg, 2.5 mg, Nebulization, TID, Samuella Cota, MD, 2.5 mg at 06/11/22 1432   ascorbic acid (VITAMIN C) tablet 500 mg, 500 mg, Per Tube, BID, Maryjane Hurter, MD, 500 mg at 06/11/22 0820   Chlorhexidine Gluconate Cloth 2 % PADS 6 each, 6 each, Topical, Daily, Maryjane Hurter, MD, 6 each at 06/11/22 1114   clonazePAM  (KLONOPIN) tablet 0.5 mg, 0.5 mg, Per Tube, Daily, Maryjane Hurter, MD, 0.5 mg at 06/11/22 6720   clonazePAM (KLONOPIN) tablet 1 mg, 1 mg, Oral, BID PRN, Gleason, Otilio Carpen, PA-C, 1 mg at 06/11/22 1153   clonazePAM (KLONOPIN) tablet 2 mg, 2 mg, Per Tube, QHS, Maryjane Hurter, MD, 2 mg at 06/10/22 2120   collagenase (SANTYL) ointment, , Topical, Daily, Samuella Cota, MD, Given at 06/11/22 0818   docusate (COLACE) 50 MG/5ML liquid 100 mg, 100 mg, Per Tube, BID PRN, Maryjane Hurter, MD   famotidine (PEPCID) tablet 20 mg, 20 mg, Per Tube, BID, Maryjane Hurter, MD, 20 mg at 06/11/22 9470   feeding supplement (VITAL AF 1.2 CAL) liquid 1,000 mL, 1,000 mL, Per Tube, Continuous, Samuella Cota, MD, Last Rate: 60 mL/hr at 06/10/22 2123, 1,000 mL at 06/10/22 2123   fiber supplement (BANATROL TF) liquid 60 mL, 60 mL, Per Tube, TID, Samuella Cota, MD, 60 mL at 06/11/22 1635   fluconazole (DIFLUCAN) tablet 200 mg, 200 mg, Per Tube, Daily, Samuella Cota, MD, 200 mg at 06/11/22 0820   fluticasone (FLONASE) 50 MCG/ACT nasal spray 1 spray, 1 spray, Each Nare, Daily, Mahan, Wayna Chalet, NP, 1 spray at 96/28/36 6294   folic acid (FOLVITE) tablet 1 mg, 1 mg, Per Tube, Daily, Maryjane Hurter, MD, 1 mg at 06/11/22 0820   gabapentin (NEURONTIN) tablet 300 mg, 300 mg, Per Tube, BID, Maryjane Hurter, MD, 300 mg at 06/11/22 0826   guaiFENesin (ROBITUSSIN) 100 MG/5ML liquid 5 mL, 5 mL, Per Tube, Q4H PRN, Maryjane Hurter, MD   heparin injection 5,000 Units, 5,000 Units, Subcutaneous, Q8H, Maryjane Hurter, MD, 5,000 Units at 06/11/22 1209   HYDROmorphone (DILAUDID) injection 0.5 mg, 0.5 mg, Intravenous, Q4H PRN, Samuella Cota, MD, 0.5 mg at 06/11/22 1845   HYDROmorphone (DILAUDID) tablet 2-4 mg, 2-4 mg, Per Tube, Q4H PRN, Samuella Cota, MD, 2 mg at 06/11/22 1633   insulin aspart (novoLOG) injection 0-9 Units, 0-9 Units, Subcutaneous, Q4H, Maryjane Hurter, MD, 1 Units at 06/10/22  0811   lidocaine (LIDODERM) 5 % 1 patch, 1 patch, Transdermal, Q24H, Samuella Cota, MD, 1 patch at 06/11/22 1042   metoprolol tartrate (LOPRESSOR) tablet 25 mg, 25 mg, Per Tube, Q8H, Maryjane Hurter, MD, 25 mg at 06/11/22 1207   nutrition supplement (JUVEN) (JUVEN) powder packet 1 packet, 1 packet, Per Tube, BID BM, Maryjane Hurter, MD, 1 packet at 06/11/22 1206   ondansetron (ZOFRAN) injection 4 mg, 4 mg, Intravenous, Q6H PRN, Maryjane Hurter, MD   Oral care mouth rinse, 15 mL, Mouth Rinse, 4 times per day, Maryjane Hurter, MD, 15 mL at 06/11/22 1638   Oral care mouth rinse, 15 mL, Mouth Rinse, PRN, Maryjane Hurter, MD   polyethylene glycol (MIRALAX / GLYCOLAX) packet 17 g, 17 g, Per Tube, Daily PRN, Maryjane Hurter, MD   prochlorperazine (COMPAZINE) injection 10 mg, 10 mg, Intravenous, Q6H PRN, Verlee Monte Hortencia Conradi, MD,  10 mg at 06/05/22 1412   QUEtiapine (SEROQUEL) tablet 50 mg, 50 mg, Oral, TID, Julian Hy, DO, 50 mg at 06/10/22 1000   revefenacin (YUPELRI) nebulizer solution 175 mcg, 175 mcg, Nebulization, Daily, Maryjane Hurter, MD, 175 mcg at 06/11/22 1829   senna-docusate (Senokot-S) tablet 1 tablet, 1 tablet, Oral, QHS, Maryjane Hurter, MD, 1 tablet at 06/10/22 2121   sodium chloride HYPERTONIC 3 % nebulizer solution 4 mL, 4 mL, Nebulization, BID, Maryjane Hurter, MD, 4 mL at 06/11/22 9371   sulfamethoxazole-trimethoprim (BACTRIM DS) 800-160 MG per tablet 2 tablet, 2 tablet, Oral, Q12H, Samuella Cota, MD, 2 tablet at 06/11/22 6967   thiamine (VITAMIN B1) tablet 100 mg, 100 mg, Per Tube, Daily, Maryjane Hurter, MD, 100 mg at 06/11/22 0820   traZODone (DESYREL) tablet 50 mg, 50 mg, Oral, QHS PRN, Maryjane Hurter, MD, 50 mg at 06/10/22 2120   valproic acid (DEPAKENE) 250 MG/5ML solution 500 mg, 500 mg, Per Tube, BID, Julian Hy, DO, 500 mg at 06/11/22 0818   vitamin A capsule 10,000 Units, 10,000 Units, Oral, Daily, Maryjane Hurter, MD,  10,000 Units at 06/11/22 8938  Patients Current Diet:  Diet Order             Diet NPO time specified  Diet effective now                   Precautions / Restrictions Precautions Precautions: Fall, Other (comment) Precaution Comments: cortrak, trach collar, multiple wounds along spine and sacrum Restrictions Weight Bearing Restrictions: No   Has the patient had 2 or more falls or a fall with injury in the past year? Unknown  Prior Activity Level Limited Community (1-2x/wk): Went out 2-3 times a week.  Was driving.  Prior Functional Level Self Care: Did the patient need help bathing, dressing, using the toilet or eating? Independent  Indoor Mobility: Did the patient need assistance with walking from room to room (with or without device)? Independent  Stairs: Did the patient need assistance with internal or external stairs (with or without device)? Independent  Functional Cognition: Did the patient need help planning regular tasks such as shopping or remembering to take medications? Independent  Patient Information Are you of Hispanic, Latino/a,or Spanish origin?: A. No, not of Hispanic, Latino/a, or Spanish origin What is your race?: A. White Do you need or want an interpreter to communicate with a doctor or health care staff?: 0. No  Patient's Response To:  Health Literacy and Transportation Is the patient able to respond to health literacy and transportation needs?: Yes Health Literacy - How often do you need to have someone help you when you read instructions, pamphlets, or other written material from your doctor or pharmacy?: Never In the past 12 months, has lack of transportation kept you from medical appointments or from getting medications?: No In the past 12 months, has lack of transportation kept you from meetings, work, or from getting things needed for daily living?: No  Home Assistive Devices / Equipment Home Equipment: None  Prior Device Use: Indicate  devices/aids used by the patient prior to current illness, exacerbation or injury? None of the above  Current Functional Level Cognition  Overall Cognitive Status: Impaired/Different from baseline Current Attention Level: Selective Orientation Level: Oriented X4 Following Commands: Follows one step commands with increased time, Follows one step commands consistently Safety/Judgement: Decreased awareness of deficits, Decreased awareness of safety General Comments: internally distracted with multiple complaints and repeatedly  asking random questions despite already given answer and frequent attempts to reorient. pt requiring "tough love" with significantly decreased awareness of need relationship between tolerating some pain and fatigue with mobility in order to progress. pt telling sister on phone at beginning of session that she wants to walk and get home, why didn't PT start earlier, then later stating during session that she can't and it's too soon. overall willing to participate and letting PT "push" her some    Extremity Assessment (includes Sensation/Coordination)  Upper Extremity Assessment: RUE deficits/detail, LUE deficits/detail RUE Deficits / Details: 2/5 shoulder, 3/5 elbow, 4/5 gross grip RUE Coordination: decreased fine motor, decreased gross motor LUE Deficits / Details: 2/5 shoulder, 3/5 elbow, 3+/5 gross grip LUE Coordination: decreased fine motor, decreased gross motor  Lower Extremity Assessment: Defer to PT evaluation RLE Deficits / Details: grossly 2+/5 pt with delay in response to motor commands with pt able to lift legs and tolerate standing trials LLE Deficits / Details: grossly 2+/5 pt with delay in response to motor commands with pt able to lift legs and tolerate standing trials    ADLs  Overall ADL's : Needs assistance/impaired Eating/Feeding: Maximal assistance Grooming: Oral care, Sitting, Supervision/safety Upper Body Dressing : Minimal assistance Toilet  Transfer: Moderate assistance, Buyer, retail Details (indicate cue type and reason): simulated Toileting- Clothing Manipulation and Hygiene: Total assistance Functional mobility during ADLs: Moderate assistance General ADL Comments: limited by activity tolerance. improvement noted in sitting balance and strength this date    Mobility  Overal bed mobility: Needs Assistance Bed Mobility: Rolling Rolling: Modified independent (Device/Increase time) Sidelying to sit: Mod assist Supine to sit: Mod assist, HOB elevated Sit to supine: Min assist General bed mobility comments: limited by buttocks wound pain, modA to scoot hips with pad to EOB, minA for trunk elevation; minA for BLE management return to supine; mod indep to roll onto R-side with use of bed rails for pillow placement    Transfers  Overall transfer level: Needs assistance Equipment used: Rolling walker (2 wheels) Transfers: Sit to/from Stand, Bed to chair/wheelchair/BSC Sit to Stand: Min assist, Mod assist Bed to/from chair/wheelchair/BSC transfer type:: Step pivot Step pivot transfers: Mod assist Transfer via Lift Equipment: [employer] transfer comment: initial stand and step pivot from bed>BSC with RW and minA for stability and RW management; stand from [facility] with pivotal steps to EOB with RW and modA, pt sitting prematurely resulting requiring modA to control descent, pt falling over to R side on EOB stating "I can't sit up"; additional 2x sit<>stand from EOB with RW and heavy modA, repeated cues for correct hand placement, sequencing and redirection to task at hand    Ambulation / Gait / Stairs / Wheelchair Mobility  Ambulation/Gait General Gait Details: unable Pre-gait activities: pivotal steps from bed<>BSC, side steps towards HOB, then standing at EOB marching in place ~4-5x - prolonged seated rest breaks between standing bouts, consistent modA to maintain trunk elevation with increased fatigue    Posture /  Balance Dynamic Sitting Balance Sitting balance - Comments: at times reports she can't sit due to buttocks pain, other times saying it feels good to sit EOB; min guard for bouts of sitting EOB without UE support. able to perform pericare sitting on BSC Balance Overall balance assessment: Needs assistance Sitting-balance support: Feet supported Sitting balance-Leahy Scale: Fair Sitting balance - Comments: at times reports she can't sit due to buttocks pain, other times saying it feels good to sit EOB; min guard for bouts of  sitting EOB without UE support. able to perform pericare sitting on BSC Postural control: Posterior lean, Right lateral lean, Left lateral lean Standing balance support: Bilateral upper extremity supported Standing balance-Leahy Scale: Poor Standing balance comment: reliant on BUE support and external assist    Special needs/care consideration Continuous Drip IV  KVO, Skin Large sacral and buttock pressure wounds, Behavioral consideration Is Bipolar with mood swings, and Special service needs ***   Previous Home Environment (from acute therapy documentation) Living Arrangements: Spouse/significant other Available Help at Discharge: Family, Available 24 hours/day Type of Home: House Home Layout: One level Home Access: Stairs to enter [employer] of Steps: 3 Bathroom Shower/Tub: Chiropodist: Standard  Discharge Living Setting Plans for Discharge Living Setting: House, Lives with (comment) (Plans to go home with parents.  Says that she is separated and not going back to husband.) Type of Home at Discharge: House Discharge Home Layout: One level, Laundry or work area in basement, Able to live on main level with bedroom/bathroom Discharge Home Access: Stairs to enter Entrance Stairs-Number of Steps: 2 steps and then 1 more step into parents home Discharge Bathroom Shower/Tub: Tub/shower unit, Curtain Discharge Bathroom Toilet:  Standard Discharge Bathroom Accessibility: Yes How Accessible: Accessible via walker  Social/Family/Support Systems Patient Roles: Spouse, Parent, Other (Comment) (Has elderly parents, husband and daughter/son) Contact Information: Valere Dross - mother - [phone] Anticipated Caregiver: mom Ability/Limitations of Caregiver: Mom is 39 yo, retired, and cannot physically care for patient.  Needs patient to be able to care for herself. Caregiver Availability: 24/7 Discharge Plan Discussed with Primary Caregiver: Yes (Spoke with parents at length.) Is Caregiver In Agreement with Plan?: Yes Does Caregiver/Family have Issues with Lodging/Transportation while Pt is in Rehab?: No  Goals Patient/Family Goal for Rehab: PT/OT/SLP supervision goals Expected length of stay: 12-14 days Pt/Family Agrees to Admission and willing to participate: Yes Program Orientation Provided & Reviewed with Pt/Caregiver Including Roles  & Responsibilities: Yes  Decrease burden of Care through IP rehab admission: N/A  Possible need for SNF placement upon discharge: Yes, if she does not progress to where parents can manage at their home  Patient Condition: I have reviewed medical records from [facility], spoken with CM, and patient and family member. I met with patient at the bedside and discussed via phone for inpatient rehabilitation assessment.  Patient will benefit from ongoing PT, OT, and SLP, can actively participate in 3 hours of therapy a day 5 days of the week, and can make measurable gains during the admission.  Patient will also benefit from the coordinated team approach during an Inpatient Acute Rehabilitation admission.  The patient will receive intensive therapy as well as Rehabilitation physician, nursing, social worker, and care management interventions.  Due to bladder management, bowel management, safety, skin/wound care, disease management, medication administration, pain management, patient  education, and Trach care and feeding tube care  the patient requires 24 hour a day rehabilitation nursing.  The patient is currently *** with mobility and basic ADLs.  Discharge setting and therapy post discharge at home with home health is anticipated.  Patient has agreed to participate in the Acute Inpatient Rehabilitation Program and will admit {Time; today/tomorrow:10263}.  Preadmission Screen Completed By:  Retta Diones, 06/11/2022 8:17 PM ______________________________________________________________________   Discussed status with Dr. Marland Kitchen on *** at *** and received approval for admission today.  Admission Coordinator:  Retta Diones, RN, time Marland KitchenSudie Grumbling ***   Assessment/Plan: Diagnosis: Does the need for close, 24 hr/day  Medical supervision in concert with the patient's rehab needs make it unreasonable for this patient to be served in a less intensive setting? {yes_no_potentially:3041433} Co-Morbidities requiring supervision/potential complications: *** Due to {due QP:5916384}, does the patient require 24 hr/day rehab nursing? {yes_no_potentially:3041433} Does the patient require coordinated care of a physician, rehab nurse, PT, OT, and SLP to address physical and functional deficits in the context of the above medical diagnosis(es)? {yes_no_potentially:3041433} Addressing deficits in the following areas: {deficits:3041436} Can the patient actively participate in an intensive therapy program of at least 3 hrs of therapy 5 days a week? {yes_no_potentially:3041433} The potential for patient to make measurable gains while on inpatient rehab is {potential:3041437} Anticipated functional outcomes upon discharge from inpatient rehab: {functional outcomes:304600100} PT, {functional outcomes:304600100} OT, {functional outcomes:304600100} SLP Estimated rehab length of stay to reach the above functional goals is: *** Anticipated discharge destination: {anticipated dc setting:21604} 10. Overall  Rehab/Functional Prognosis: {potential:3041437}   MD Signature: ***

## 2022-06-11 NOTE — Progress Notes (Addendum)
Physical Therapy Treatment Patient Details Name: Monica Hopkins MRN: [id number] DOB: May 05, 1971 Today's Date: 06/11/2022   History of Present Illness Pt is a 51 y.o. female admitted 05/19/22 with cardiac arrest at home, ROSC 26 min, hypothermic, rib fx from CPR. Intubated 10/17, failed extubation 10/23; trach placed 10/25. Pt with new HFpEF. Rapid response 11/4 due to recurrent desaturation with L mucous plugging and small effusion; transfer back to ICU 11/5-11/8. IR consulted for PEG 11/7. PMH includes bipolar disorder.   PT Comments    Pt progressing with mobility. Today's session focused on transfer and pre-gait activity; pt tolerating increased bouts of standing with RW and modA. Pt pleasant and agreeable this session; pt still continues to have multiple complaints and requests requiring frequent redirection, but ultimately accepting of "tough love" in order to progress mobility. Pt remains limited by generalized weakness, decreased activity tolerance, poor balance strategies/postural reactions and impaired cognition. Continue to recommend intensive AIR-level therapies to maximize functional mobility and independence prior to return home.  Confirmed with RN and nurse secretary, bed with pressure relief mattress to be ordered for patients due to multiple wounds    Recommendations for follow up therapy are one component of a multi-disciplinary discharge planning process, led by the attending physician.  Recommendations may be updated based on patient status, additional functional criteria and insurance authorization.  Follow Up Recommendations  Acute inpatient rehab (3hours/day) Can patient physically be transported by private vehicle: No   Assistance Recommended at Discharge Frequent or constant Supervision/Assistance  Patient can return home with the following Direct supervision/assist for medications management;Help with stairs or ramp for entrance;Assist for transportation;Assistance with  cooking/housework;Direct supervision/assist for financial management;Assistance with feeding;A lot of help with walking and/or transfers;A lot of help with bathing/dressing/bathroom   Equipment Recommendations  Wheelchair (measurements PT);Hospital bed;Wheelchair cushion (measurements PT)    Recommendations for Other Services       Precautions / Restrictions Precautions Precautions: Fall;Other (comment) Precaution Comments: cortrak, trach collar, multiple wounds along spine and sacrum Restrictions Weight Bearing Restrictions: No     Mobility  Bed Mobility Overal bed mobility: Needs Assistance Bed Mobility: Rolling Rolling: Modified independent (Device/Increase time)   Supine to sit: Mod assist, HOB elevated Sit to supine: Min assist   General bed mobility comments: limited by buttocks wound pain, modA to scoot hips with pad to EOB, minA for trunk elevation; minA for BLE management return to supine; mod indep to roll onto R-side with use of bed rails for pillow placement    Transfers Overall transfer level: Needs assistance Equipment used: Rolling walker (2 wheels) Transfers: Sit to/from Stand, Bed to chair/wheelchair/BSC Sit to Stand: Min assist, Mod assist   Step pivot transfers: Mod assist       General transfer comment: initial stand and step pivot from bed>BSC with RW and minA for stability and RW management; stand from [facility] with pivotal steps to EOB with RW and modA, pt sitting prematurely resulting requiring modA to control descent, pt falling over to R side on EOB stating "I can't sit up"; additional 2x sit<>stand from EOB with RW and heavy modA, repeated cues for correct hand placement, sequencing and redirection to task at hand    Ambulation/Gait             Pre-gait activities: pivotal steps from bed<>BSC, side steps towards HOB, then standing at EOB marching in place ~4-5x - prolonged seated rest breaks between standing bouts, consistent modA to maintain  trunk elevation with increased fatigue  Stairs             Wheelchair Mobility    Modified Rankin (Stroke Patients Only)       Balance Overall balance assessment: Needs assistance Sitting-balance support: Feet supported Sitting balance-Leahy Scale: Fair Sitting balance - Comments: at times reports she can't sit due to buttocks pain, other times saying it feels good to sit EOB; min guard for bouts of sitting EOB without UE support. able to perform pericare sitting on BSC   Standing balance support: Bilateral upper extremity supported Standing balance-Leahy Scale: Poor Standing balance comment: reliant on BUE support and external assist                            Cognition Arousal/Alertness: Awake/alert Behavior During Therapy: Anxious, Restless Overall Cognitive Status: Impaired/Different from baseline Area of Impairment: Attention, Memory, Awareness, Problem solving, Following commands, Safety/judgement                   Current Attention Level: Selective Memory: Decreased short-term memory Following Commands: Follows one step commands with increased time, Follows one step commands consistently Safety/Judgement: Decreased awareness of deficits, Decreased awareness of safety Awareness: Emergent Problem Solving: Slow processing, Difficulty sequencing, Requires verbal cues General Comments: internally distracted with multiple complaints and repeatedly asking random questions despite already given answer and frequent attempts to reorient. pt requiring "tough love" with significantly decreased awareness of need relationship between tolerating some pain and fatigue with mobility in order to progress. pt telling sister on phone at beginning of session that she wants to walk and get home, why didn't PT start earlier, then later stating during session that she can't and it's too soon. overall willing to participate and letting PT "push" her some         Exercises      General Comments General comments (skin integrity, edema, etc.): SpO2 >/96% on 10L O2 trach collar at 45-60% FiO2. pt on sister at beginning of session discussing motivation to participate and get back to walking - some if this motivation is lost when pt starts to experience pain and fatigue, but utlimately responding to "tough love" this session and frequent redirection to task.      Pertinent Vitals/Pain Pain Assessment Pain Assessment: Faces Faces Pain Scale: Hurts little more Pain Location: multiple changing complaints, including ribs, stomach, buttocks, back, legs Pain Descriptors / Indicators: Discomfort, Grimacing Pain Intervention(s): Monitored during session, Premedicated before session, Patient requesting pain meds-RN notified, Repositioned    Home Living                          Prior Function            PT Goals (current goals can now be found in the care plan section) Acute Rehab PT Goals Patient Stated Goal: "I want to be able to walk so I can go to mom's house" PT Goal Formulation: With patient Time For Goal Achievement: 06/25/22 Potential to Achieve Goals: Fair Progress towards PT goals: Progressing toward goals    Frequency    Min 3X/week      PT Plan Current plan remains appropriate    Co-evaluation              AM-PAC PT "6 Clicks" Mobility   Outcome Measure  Help needed turning from your back to your side while in a flat bed without using bedrails?: A Little Help needed moving from  lying on your back to sitting on the side of a flat bed without using bedrails?: A Lot Help needed moving to and from a bed to a chair (including a wheelchair)?: A Lot Help needed standing up from a chair using your arms (e.g., wheelchair or bedside chair)?: A Lot Help needed to walk in hospital room?: Total Help needed climbing 3-5 steps with a railing? : Total 6 Click Score: 11    End of Session Equipment Utilized During Treatment:  Gait belt;Oxygen Activity Tolerance: Patient tolerated treatment well Patient left: in bed;with call bell/phone within reach;with bed alarm set Nurse Communication: Mobility status PT Visit Diagnosis: Other abnormalities of gait and mobility (R26.89);Muscle weakness (generalized) (M62.81);Unsteadiness on feet (R26.81);Other symptoms and signs involving the nervous system (R29.898);Difficulty in walking, not elsewhere classified (R26.2)     Time: 8563-1497 PT Time Calculation (min) (ACUTE ONLY): 34 min  Charges:  $Gait Training: 8-22 mins $Therapeutic Activity: 8-22 mins                     Ina Homes, PT, DPT Acute Rehabilitation Services  Personal: Secure Chat Rehab Office: [phone]  Malachy Chamber 06/11/2022, 10:46 AM

## 2022-06-11 NOTE — Progress Notes (Signed)
Called to patients room she was having desaturation issue. Removed PMV speaking valve and changed patients inner cannula.  Suctioned patient and suggested to leave PMV off for a while.  Patients SP02 was 96%.

## 2022-06-11 NOTE — Progress Notes (Signed)
Palliative-   Chart reviewed- goals are clarified.  Palliative will sign off for now.   Please call if further acute Palliative needs arise.   Ocie Bob, AGNP-C Palliative Medicine  No charge

## 2022-06-12 ENCOUNTER — Encounter (HOSPITAL_COMMUNITY): Payer: Self-pay | Admitting: Pulmonary Disease

## 2022-06-12 ENCOUNTER — Inpatient Hospital Stay (HOSPITAL_COMMUNITY): Payer: Medicaid Other

## 2022-06-12 ENCOUNTER — Other Ambulatory Visit: Payer: Self-pay

## 2022-06-12 DIAGNOSIS — Z930 Tracheostomy status: Secondary | ICD-10-CM

## 2022-06-12 LAB — GLUCOSE, CAPILLARY
Glucose-Capillary: 101 mg/dL — ABNORMAL HIGH (ref 70–99)
Glucose-Capillary: 105 mg/dL — ABNORMAL HIGH (ref 70–99)
Glucose-Capillary: 85 mg/dL (ref 70–99)
Glucose-Capillary: 91 mg/dL (ref 70–99)
Glucose-Capillary: 92 mg/dL (ref 70–99)
Glucose-Capillary: 95 mg/dL (ref 70–99)
Glucose-Capillary: 98 mg/dL (ref 70–99)

## 2022-06-12 MED ORDER — ENSURE ENLIVE PO LIQD
237.0000 mL | Freq: Two times a day (BID) | ORAL | Status: DC
  Administered 2022-06-14 – 2022-06-17 (×7): 237 mL via ORAL

## 2022-06-12 MED ORDER — HYDROCORTISONE (PERIANAL) 2.5 % EX CREA
TOPICAL_CREAM | Freq: Two times a day (BID) | CUTANEOUS | Status: DC
  Administered 2022-06-20: 1 via RECTAL
  Filled 2022-06-12 (×3): qty 28.35

## 2022-06-12 MED ORDER — CLONAZEPAM 0.5 MG PO TABS
1.0000 mg | ORAL_TABLET | Freq: Two times a day (BID) | ORAL | Status: DC | PRN
  Administered 2022-06-14 – 2022-06-22 (×15): 1 mg
  Filled 2022-06-12 (×17): qty 2

## 2022-06-12 MED ORDER — LOPERAMIDE HCL 2 MG PO CAPS
2.0000 mg | ORAL_CAPSULE | ORAL | Status: DC | PRN
  Administered 2022-06-12 – 2022-06-14 (×2): 2 mg via ORAL
  Filled 2022-06-12 (×2): qty 1

## 2022-06-12 NOTE — Progress Notes (Signed)
Patient has been made aware throughout this evening by RN, charge as well as RR nurse that she is not to eat or drink without passy-muir valve in. Patient wanted to eat ice cream was advised by RN that she would bring her some ice cream and put her valve in when she returned.  Wnen the RN returned the patient had been drinking grape juice and the patient stated, "my valve is not in". RN reminded the patient that she should not be drinking without the valve. The patient was initially very remorseful and kept apologizing. The patient was draining secretions and grape juice from her trach. RN called RR nurse to advise what happened and RR nurse stated the pt would be NPO going forward and to notify Dr. Imogene Burn. Once RN advised the pt she could not longer eat or drink, the patient became very upset and argumentative. She kept stating the RN told her the valve was in and that she did not know. She wanted a solution for what to do to make her remember not to drink without her valve. She stated there is nothing in her lungs and she should not be punished. RN advised pt this is not punishment, however we have to make sure she is ok. This is to help not hurt her. Patient called her daughter. Daughter called and spoke to RN and was advised of why her mother is NPO at this time.

## 2022-06-12 NOTE — Progress Notes (Signed)
Nutrition Follow-up  DOCUMENTATION CODES:   Severe malnutrition in context of social or environmental circumstances, Underweight  INTERVENTION:  Continue tube feeding via Cortrak: -Vital AF 1.2 at 60 mL/hour  -Provides: 1728 kcal, 108 grams of protein, 1166 mL H2O daily  RD continues to recommend G-tube placement for nutrition support given profound severe malnutrition on admission, increased needs for wound healing, etc. Further recommendations to follow post G-tube placement.  Provide Ensure Enlive po BID now that diet advanced, each supplement provides 350 kcal and 20 grams of protein.  Continue Juven BID per tube, each packet provides 80 calories, 8 grams of carbohydrate, 2.5  grams of protein (collagen), 7 grams of L-arginine and 7 grams of L-glutamine; supplement contains CaHMB, Vitamins C, E, B12 and Zinc to promote wound healing.  Continue Vitamin C 500 mg BID per tube, folic acid 1 mg daily per tube, thiamine 100 mg daily per tube.  Recommend providing vitamin a capsule 10000 units daily by mouth now that diet has been advanced so pt will receive more of contents of capsule than when given per tube.  Continue Banatrol TF 60 mL TID per tube.  NUTRITION DIAGNOSIS:   Severe Malnutrition related to social / environmental circumstances (poor PO intake related to poor dentition after taking lithium) as evidenced by severe fat depletion, severe muscle depletion.  Ongoing.  GOAL:   Patient will meet greater than or equal to 90% of their needs  Met with TF regimen.  MONITOR:   Vent status, Labs, Weight trends, TF tolerance, Skin, I & O's  REASON FOR ASSESSMENT:   Ventilator    ASSESSMENT:   51 year old female with PMHx of bipolar disorder, poor dentition, unexplained wt loss admitted after cardiac arrest. Found unconscious by husband who started CPR immediately. Intubated in ED on 05/19/22.  10/17 Admitted post arrest 10/18 Cortrak placed, TF initiated at 15  ml/hr 10/19 TF increased to 25 ml/hr 10/20 Off pressors 10/23 Failed extubation  10/25 Trach 11/05 Transferred back to ICU 11/08 Transferred back to 4E 11/10 s/p MBS and diet advanced to full liquids  Pt previously underwent CT abdomen indicating that pt's anatomy is not amenable to PEG placement and recommendations are for surgical consult for G-tube placement. MD has discussed with Surgery and there are concerns about risk for placement. RD recommends placement of G-tube given profound severe malnutrition on admission and increased nutrient needs for wound healing and recovery.  Met with patient at bedside after she returned from [facility]. She is on trach collar with O2 flow rate 10 L/min FiO2 40%. At that time results had not been finalized. Pt reported she was tolerating tube feeds well. Denies nausea, emesis, or abdominal pain at time of RD assessment. Patient looking forward to hopefully being able to drink soda and coffee soon. Following RD assessment diet was advanced to full liquids by SLP.  Most recent wt was 49.4 kg on 06/11/22. Overall stable from wt of 50 kg on 10/17. Suspect fluctuations in weight may be related to use of different scales with transferring between units. Will continue to monitor weight trends.  UOP: 3 occurrences unmeasured UOP today  I/O: +5,446.4 mL since admission but exact output not being measured  Enteral Access: 10 Fr. Cortrak tube placed 10/18; 76 cm at left nare with bridle in place; terminates in duodenal bulb per CT abd/pelvis 11/8  Tube Feed Regimen: Vital AF 1.2 at 60 mL/hour  Medications reviewed and include: vitamin C 500 mg BID per tube, clonazepam,  famotidine, Banatrol TF 60 mL TID, Diflucan, folic acid 1 mg daily per tube, gabapentin, Novolog 0-9 units Q4hrs, Juven BID per tube, senna-docusate, Bactrim, thiamine 100 mg daily per tube, vitamin A capsules 10000 units daily.  Labs reviewed: CBG 85-101; On 11/9: BUN 55  Micronutrient Labs: CRP: 23.2  (H) Vitamin B12: 4289 (H) Folate B9: 6.0 (low normal)-continue supplementation Vitamin A: 3.7 (L) Vitamin C: <0.01 (L) Copper: 222 (wdl) Zinc: 46 (wdl)  Discussed with MD, Surgery PA, and SLP in secure chat. RD recommends placement of G-tube so patient can receive long-term nutrition support.  Diet Order:   Diet Order             Diet full liquid Room service appropriate? Yes with Assist; Fluid consistency: Thin  Diet effective now                  EDUCATION NEEDS:   Not appropriate for education at this time  Skin:  Skin Assessment: Skin Integrity Issues: Skin Integrity Issues:: DTI, Stage II, Stage III DTI: left lateral heel (2cm x 2cm); right lateral heel (1cm x 2cm) Stage II: sacrum (7cm x 10cm), vertebral column (3cm x 1cm), lower vertebral column (1cm x 1cm) Stage III: upper vertebral column (1cm x 1cm)  Last BM:  06/12/22 medium type 7  Height:   Ht Readings from Last 1 Encounters:  05/19/22 _0  (1.676 m)   Weight:   Wt Readings from Last 1 Encounters:  06/11/22 49.4 kg   Ideal Body Weight:  59.1 kg  BMI:  Body mass index is 17.58 kg/m.  Estimated Nutritional Needs:   Kcal:  1750-1950 kcals  Protein:  90-105 g  Fluid:  >/= 1.8 L  Monica Hopkins Magda Paganini, MS, RD, LDN, CNSC Pager number available on Amion

## 2022-06-12 NOTE — Progress Notes (Signed)
Modified Barium Swallow Progress Note  Patient Details  Name: Monica Hopkins MRN: [id number] Date of Birth: 04-20-1971  Today's Date: 06/12/2022  Modified Barium Swallow completed.  Full report located under Chart Review in the Imaging Section.  Brief recommendations include the following:  Clinical Impression  PMSV was donned for the study and she tolerated it well. Pt demonstrated and reported anxiety throughout the study and she required encouragement and emotional support to complete the study since she was reportedly "scared of aspirating".   Pt presents with pharyngeal dysphagia characterized by a pharyngeal delay and cotinued reduction in tongue base retraction, pharyngeal constriction, hyolaryngeal elevation, and anterior laryngeal movement, but these are improved compared to the last study. She still demonstrates vallecular residue, and reduced airway closure, but epiglottic inversion and airway protection were improved today with use of compensatory strategies. Penetration (PAS 3) was noted with dysphagia 3, puree, and honey thick liquids after the swallow from pyriform sinus residue; pt consistently sensed this, coughed, and swallowed again which improved clearance. A chin tuck posture was used with all trials after honey thick liquids and improved airway protection as well as pharyngeal clearance. Penetration (PAS 3) was noted during the swallow with nectar thick liquids secondary to the delay. Penetration (PAS 2) was noted with thin liquids when pt was prompted to attempt larger boluses. However, penetration did not progress to aspiration with any trials due to pt's independent management of penetrated material.   Considering pt's performance and risk for dysphagia-related adverse events, SLP anticipates that thin liquids pose a lower risk due to pt's ability to clear this consistency from the pharynx and her likely ability to better clear this if penetrated. A clear liquid diet will be  initiated at this time with observance of swallowing precautions and SLP will continue to follow for dysphagia treatment.   Pt's overall swallow function appears improved compared to that noted on 11/3; however, considering pt's weight loss prior to admission, the restricted nature of the recommended diet, pt's report of being very fatigued after consuming the limited trials of the study, and pt's reported intent to refuse any diet that is of a modified consistency, SLP suspects that enteral nutrition will likely still be necessary to augment p.o. intake. Pt's performance and recommendations discussed with Dr. Irene Limbo and Alesia Banda, RD.    Swallow Evaluation Recommendations       SLP Diet Recommendations: Thin liquid (clear liquids)   Liquid Administration via: Cup;Straw   Medication Administration: Via alternative means (or crushed with puree and followed by thin liquid bolus)   Supervision: Full supervision/cueing for compensatory strategies   Compensations: Slow rate;Small sips/bites;Chin tuck   Postural Changes: Seated upright at 90 degrees   Oral Care Recommendations: Oral care QID;Oral care before and after PO      Jernee Murtaugh I. Vear Clock, MS, CCC-SLP Acute Rehabilitation Services Office number [phone]   Scheryl Marten 06/12/2022,3:47 PM

## 2022-06-12 NOTE — Progress Notes (Signed)
Hold serequel per pt request. Pt informed that she was allergic to this med. MD informed and aware.  Lawson Radar, RN

## 2022-06-12 NOTE — Progress Notes (Signed)
   06/12/22 1000  Clinical Encounter Type  Visited With Patient  Visit Type Initial;Spiritual support  Referral From Nurse  Consult/Referral To Chaplain   Chaplain responded to a spiritual consult for advanced directive training. The patient is not interested. The patient, Monica Hopkins, stated I want everything done to keep me alive that is available. The medical team will know when that is no longer possible.    Valerie Roys [facility]  [phone]

## 2022-06-12 NOTE — Progress Notes (Signed)
Occupational Therapy Treatment Patient Details Name: Anquanette Bahner MRN: [id number] DOB: [birth year] Today's Date: 06/12/2022   History of present illness Pt is a 51 y.o. female admitted 05/19/22 with cardiac arrest at home, ROSC 26 min, hypothermic, rib fx from CPR. Intubated 10/17, failed extubation 10/23; trach placed 10/25. Pt with new HFpEF. Rapid response 11/4 due to recurrent desaturation with L mucous plugging and small effusion; transfer back to ICU 11/5-11/8. IR consulted for PEG 11/7. PMH includes bipolar disorder.   OT comments  With encouragement and significant redirection to maintain task attention and put forth her best effort, pt able to ambulate to door with RW and min to mod of 2 and close chair follow. Pt stood at sink and demonstrated ability to release sink with one hand and reach with the opposite with min assist. Pt can self feed ice chips and comb her hair. She completed pericare with baby wipe seated in bed with HOB up. Remained up in chair at end of session with support of pillows under buttocks. SpO2 remained 89% or greater on RA with exertion. Continue to recommend AIR level rehab.   Recommendations for follow up therapy are one component of a multi-disciplinary discharge planning process, led by the attending physician.  Recommendations may be updated based on patient status, additional functional criteria and insurance authorization.    Follow Up Recommendations  Acute inpatient rehab (3hours/day)    Assistance Recommended at Discharge Frequent or constant Supervision/Assistance  Patient can return home with the following  Two people to help with walking and/or transfers;A lot of help with bathing/dressing/bathroom;Assistance with cooking/housework;Direct supervision/assist for medications management;Direct supervision/assist for financial management;Assist for transportation;Help with stairs or ramp for entrance   Equipment Recommendations  Other (comment) (defer to  next venue)    Recommendations for Other Services      Precautions / Restrictions Precautions Precautions: Fall;Other (comment) Precaution Comments: cortrak, trach collar, multiple wounds along spine and sacrum Restrictions Weight Bearing Restrictions: No       Mobility Bed Mobility Overal bed mobility: Needs Assistance Bed Mobility: Supine to Sit     Supine to sit: Min assist, HOB elevated Sit to supine: Min assist   General bed mobility comments: MinA with pt pulling on therapist's hand to ascend trunk to sit up L EOB, HOB elevated    Transfers Overall transfer level: Needs assistance Equipment used: Rolling walker (2 wheels) Transfers: Sit to/from Stand Sit to Stand: Min assist, +2 physical assistance, +2 safety/equipment           General transfer comment: assist to place feet with wide BoS, to rise and steady     Balance Overall balance assessment: Needs assistance   Sitting balance-Leahy Scale: Fair Sitting balance - Comments: Pt with intermittent LOB initially sitting EOB, benefiting from UE support and min guard-minA.   Standing balance support: No upper extremity supported, Bilateral upper extremity supported, Single extremity supported, During functional activity Standing balance-Leahy Scale: Poor Standing balance comment: Reliant on up to modA for static standing balance without UE support, reliant on 1 UE support and up to minA for reaching off BOS at sink, 2 UE support and external physical assistance for gait                           ADL either performed or assessed with clinical judgement   ADL Overall ADL's : Needs assistance/impaired Eating/Feeding: Set up;Sitting Eating/Feeding Details (indicate cue type and reason): ice chips with spoon  Grooming: Brushing hair;Sitting;Standing;Set up                       Toileting- Clothing Manipulation and Hygiene: Set up;Sitting/lateral lean Toileting - Clothing Manipulation Details  (indicate cue type and reason): removed wet adult diaper herself and performed her own pericare in bed with HOB up, assist to bridge to place and fasten clean diaper     Functional mobility during ADLs: Moderate assistance;+2 for physical assistance;Rolling walker (2 wheels)      Extremity/Trunk Assessment              Vision       Perception     Praxis      Cognition Arousal/Alertness: Awake/alert Behavior During Therapy: Anxious, Restless Overall Cognitive Status: Impaired/Different from baseline Area of Impairment: Attention, Memory, Awareness, Problem solving, Following commands, Safety/judgement                   Current Attention Level: Selective Memory: Decreased short-term memory Following Commands: Follows one step commands with increased time, Follows one step commands consistently, Follows multi-step commands inconsistently Safety/Judgement: Decreased awareness of deficits, Decreased awareness of safety Awareness: Emergent Problem Solving: Slow processing, Difficulty sequencing, Requires verbal cues General Comments: needs encouragment and frequent redirection        Exercises      Shoulder Instructions       General Comments SpO2 >/= 89% on RA with PMV donned, returned to 10L 60% FiO2 via trach collar end of session    Pertinent Vitals/ Pain       Pain Assessment Pain Assessment: Faces Faces Pain Scale: Hurts little more Pain Location: generalized, buttocks Pain Descriptors / Indicators: Discomfort, Grimacing Pain Intervention(s): Monitored during session, Patient requesting pain meds-RN notified, Repositioned  Home Living                                          Prior Functioning/Environment              Frequency  Min 2X/week        Progress Toward Goals  OT Goals(current goals can now be found in the care plan section)  Progress towards OT goals: Progressing toward goals  Acute Rehab OT Goals OT Goal  Formulation: With patient Time For Goal Achievement: 06/26/22 Potential to Achieve Goals: Good ADL Goals Pt Will Perform Upper Body Dressing: with supervision;sitting Pt Will Transfer to Toilet: with min assist;ambulating;bedside commode Pt/caregiver will Perform Home Exercise Program: Increased strength;Both right and left upper extremity;With minimal assist Additional ADL Goal #1: Will participate in reaching activities outside BoS without LOB. Additional ADL Goal #2: Pt will demonstrate ability to follow 2 step commands with 75% accuracy.  Plan Discharge plan remains appropriate    Co-evaluation    PT/OT/SLP Co-Evaluation/Treatment: Yes Reason for Co-Treatment: Necessary to address cognition/behavior during functional activity;For patient/therapist safety PT goals addressed during session: Mobility/safety with mobility;Balance;Proper use of DME OT goals addressed during session: ADL's and self-care      AM-PAC OT "6 Clicks" Daily Activity     Outcome Measure   Help from another person eating meals?: A Little Help from another person taking care of personal grooming?: A Little Help from another person toileting, which includes using toliet, bedpan, or urinal?: A Lot Help from another person bathing (including washing, rinsing, drying)?: A Lot Help from another person to put on  and taking off regular upper body clothing?: A Little Help from another person to put on and taking off regular lower body clothing?: Total 6 Click Score: 14    End of Session Equipment Utilized During Treatment: Rolling walker (2 wheels);Gait belt  OT Visit Diagnosis: Muscle weakness (generalized) (M62.81);Other symptoms and signs involving cognitive function;Pain   Activity Tolerance Patient tolerated treatment well   Patient Left in chair;with call bell/phone within reach;with chair alarm set   Nurse Communication Mobility status;Patient requests pain meds        Time: 2458-0998 OT Time  Calculation (min): 34 min  Charges: OT General Charges $OT Visit: 1 Visit OT Treatments $Self Care/Home Management : 8-22 mins  Berna Spare, OTR/L Acute Rehabilitation Services Office: [phone]   Evern Bio 06/12/2022, 2:22 PM

## 2022-06-12 NOTE — Progress Notes (Signed)
Progress Note   Patient: Monica Hopkins GUY:[id number] DOB: 1971-07-30 DOA: 05/19/2022     24 DOS: the patient was seen and examined on 06/12/2022   Brief hospital course: 51 year old presented after cardiac arrest.  Found to be hypothermic and intubated in the emergency department. Significant Events 10/17: admitted post cardiac arrest; ROSC 26 minutes 10/20: off pressors, will awaken agitated, desaturates SBT.  10/23 failed extubation trial 10/25 trach 06/04/2022 she was transferred to the [facility] service 06/04/22 she desaturated on trach collar and PCCM was reconsulted. 06/05/22.  Continues to fail at swallowing and will need alternate means of feeding and she is resistant to PEG tube.  Consulted palliative care for further goals of care discussion and will also need a psychiatric evaluation 06/06/22. rapid response called given desaturation due to L mucous plugging and small effusion 06/07/22. Transferred to ICU after recurrent saturation.  She was refusing chest vest, metaneb.   06/08/22. Tolerating trach collar, accepting meds, c/o pain 06/09/22  IR consulted for PEG   Called to patients room she was having desaturation issue. Removed PMV speaking valve and changed patients inner cannula.  Suctioned patient and suggested to leave PMV off for a while.  Patients SP02 was 96%.   Assessment and Plan: S/p cardiac arrest found to be in PEA/shockable rhythm, in the setting of hypoxia due to pneumococcal pneumonia    Acute hypoxic respiratory failure due to Strep pneumonia pneumonia, complicated with aspiration and healthcare associated pneumonia with Enterobacter, Klebsiella and stenotrophomonas  --S/p trach, Intermittent mucous plugging --trach collar. Poor cough mechanics, deconditioned, doing little with PT and mobility per PCCM.  Anxious and with rib pain reluctant to do CPT, including metaneb.   --continue nebs --10/23 BAL with klebsiella and enterobacter cloacae, 10/28 tracheal aspirate with  enterobacter cloacae and stenotrophomonas, 11/4 tracheal aspirate with rare normal resp flora --continue bactrim 11/2 - present, complete 2 weeks - repeat CXR 11/7 with interval improvement in bibasilar airspace disease and L effusion.  Persistent LLL consolidation and effusion.  - continue diuresis as tolerated given L sided effusion, held today due to soft blood pressure (PCCM recommending attempt to diurese effusion prior to trying thora) -SLP working with Passy-Muir valve   New diagnosis of acute HFpEF  --echo with EF 55%, no RWMA, grade 1 diastolic dysfunction, d shaped septum suggesting RV pressure/volume overload   Severe protein calorie malnutrition --currently with cortrak --PEG considered, but would require open procedure. Discussed again w/ CCS, aware of RD and ST thoughts about PEG, but given operative risk and improvement in swallow, now on a diet, CCS does not think benefit outweighs risk. No surgical placement recommended at this point per CCS.   Acute septic/toxic encephalopathy --resolved   Acute rhabdomyolysis, resolved Hyperkalemia/Hypokalemia, resolved AKI due to septic ATN, rhabdomyolysis Hypernatremia --Creatinine stable   Anxiety depression bipolar  --psychiatry recommendations from 11/4 --seroquel 50 mg TID prn for agitation only, clonazepam 0.5 mg in the AM, 2 mg qhs, 0.5 mg BID prn.  Continue depakote 250 mg BID (this was later increased 500 mg BID).  Trazodone 50 mg qhs prn.   Rib fx from CPR --Tylenol, dilaudid prn, gabapentin   Acute blood loss anemia on anemia of chronic disease --Patient hemoglobin has stabilized now --iron panel suggestive of chronic disease   Thrombocytosis -trend   Hypoalbuminemia - follow       Subjective:  Has diarrhea, would like imodium Reports pain in rib and elsewhere Wants to eat  Physical Exam: Vitals:   06/12/22 1200  06/12/22 1320 06/12/22 1538 06/12/22 1700  BP: 97/73  104/87   Pulse: 94 (!) 101 (!) 106 82   Resp: _0 Temp: 99 F (37.2 C)  98.7 F (37.1 C)   TempSrc: Oral  Oral   SpO2: 96% 98% 98% 94%  Weight:      Height:       Physical Exam Vitals reviewed.  Constitutional:      General: She is not in acute distress.    Appearance: She is not ill-appearing or toxic-appearing.  Cardiovascular:     Rate and Rhythm: Normal rate and regular rhythm.     Heart sounds: No murmur heard.    Comments: Telemetry SR Pulmonary:     Effort: Pulmonary effort is normal. No respiratory distress.     Breath sounds: No wheezing, rhonchi or rales.  Neurological:     Mental Status: She is alert.  Psychiatric:        Mood and Affect: Mood normal.        Behavior: Behavior normal.     Data Reviewed: CBG stable  Family Communication: patient would like me to call her mother  Disposition: Status is: Inpatient Remains inpatient appropriate because: definitive nutrition not yet clear  Planned Discharge Destination:  CIR    Time spent: 25 minutes  Author: Murray Hodgkins, MD 06/12/2022 6:42 PM  For on call review [email or web address].

## 2022-06-12 NOTE — Progress Notes (Signed)
Pt dose of gabapentin requiring wasting half tablet.  Pt transferred to 2H and removed from pyxis before waste could be documented.  Half of 600mg  tablet wasted in stericycle and witnessed by , Marchelle Folks (CN).

## 2022-06-12 NOTE — Significant Event (Signed)
Rapid Response Event Note   Reason for Call :  Hypoxia  Per RN, pt was started on full liquid diet today after passing her MBS study. She was eating this evening and began to drop her oxygen to mid 70s on .40 TC. Bedside RNs took passey muir valve off, suctioned trach, and oxygen via TC was increased. RT then arrived and changed out inner cannula and increased oxygen further.   Initial Focused Assessment:  Pt lying in bed with eyes open, in no visible distress. She is alert and oriented, talking around trach. She denies SOB/CP. She says that she had been eating for about an hour prior to her oxygen level dropping and that she was eating and talking on the phone at the same time. She denies coughing/choking during eating. Lungs are rhonchus t/o, decreased in the bases. Cortrac tube in place with continuous TF going. Skin warm and dry.   HR-99, BP-93/60, RR-22, SpO2-85% on .60 TC, 90-94% on .98 TC.  Interventions:  Passey muir valve taken off Trach suctioned-thick white/tan secretions Inner cannula changed FiO2 increased PCXR Plan of Care:  Pt back on baseline FiO2 with SpO2-91%. Happenings discussed with Dr. Imogene Burn. PCXR ordered. Await results. Keep pt on full liquid diet at this time but monitor closely. Please call RRT if further assistance needed.  Event Summary:   MD Notified: Dr. Imogene Burn  Call [phone] Arrival [phone] End Time:1940  Terrilyn Saver, RN

## 2022-06-12 NOTE — Progress Notes (Signed)
Speech Language Pathology Treatment: Dysphagia  Patient Details Name: Monica Hopkins MRN: [id number] DOB: 04/03/1971 Today's Date: 06/12/2022 Time: 4163-8453 SLP Time Calculation (min) (ACUTE ONLY): 10 min  Assessment / Plan / Recommendation Clinical Impression  Pt was seen for dysphagia treatment to discuss her performance during the study and recommendation. She verbalized understanding, but required reinforcement of why more viscous consistencies were more challenging. Pt was excited about being able to have Dr. Reino Kent and had question of whether she could also have cream of broccoli soup or a caramel frappe. Pt was advised that a caramel frappe and non-broth soups are not on a clear liquid diet, but that she would be able to have these from a swallowing standpoint. It was therefore agreed that pt's diet will be modified to a full liquid diet to allow non-clear liquids, but that puree consistencies would still be restricted. Pt verbalized agreement with this plan. SLP will continue to follow pt.     HPI HPI: Pt is a 51 y.o. female admitted 05/19/22 after cardiac arrest at home, ROSC 26 min, hypothermic. ETT 10/17-10/23; reintubated 10/23-trach10/25. Pt with new HFpEF. MRI brain negative for acute changes. CXR 10/30: Stable multifocal pulmonary infiltrates and small left pleural effusion. MBS 11/3 rx NPO; G-tube being considered and IR consulted, but per radiology, anatomy is highly unfavorable and a relative contraindication. PMH: bipolar disorder.      SLP Plan         Recommendations for follow up therapy are one component of a multi-disciplinary discharge planning process, led by the attending physician.  Recommendations may be updated based on patient status, additional functional criteria and insurance authorization.    Recommendations  Diet recommendations: Thin liquid (full liquids; no purees) Liquids provided via: Cup;Straw Medication Administration: Via alternative  means Supervision: Full supervision/cueing for compensatory strategies;Staff to assist with self feeding Compensations: Slow rate;Small sips/bites;Chin tuck Postural Changes and/or Swallow Maneuvers: Seated upright 90 degrees;Chin tuck      Patient may use Passy-Muir Speech Valve: During all therapies with supervision;During PO intake/meals PMSV Supervision: Intermittent         Oral Care Recommendations: Oral care BID Follow Up Recommendations: Acute inpatient rehab (3hours/day) Assistance recommended at discharge: Frequent or constant Supervision/Assistance SLP Visit Diagnosis: Dysphagia, pharyngeal phase (R13.13)          Aleigh Grunden I. Vear Clock, MS, CCC-SLP Acute Rehabilitation Services Office number [phone]  Scheryl Marten  06/12/2022, 4:41 PM

## 2022-06-12 NOTE — Progress Notes (Signed)
Inpatient Rehab Admissions Coordinator:   Spoke with mother, Lurlean Leyden to clarify her ability to provide assistance at home. Alvira Philips reports she will need patient to be up moving around, able to get to bathroom, etc. She can provide steadying assist with mobility. She is open to learning to manage lines, trach etc. upon discharge. Will continue to follow for potential CIR admission when medically ready/bed availability. Medical still determining if patient needs PEG placement.    Rehab Admissons Coordinator [place], [place], [place] [phone]

## 2022-06-12 NOTE — Progress Notes (Signed)
Physical Therapy Treatment Patient Details Name: Monica Hopkins MRN: [id number] DOB: 1971-06-05 Today's Date: 06/12/2022   History of Present Illness Pt is a 51 y.o. female admitted 05/19/22 with cardiac arrest at home, ROSC 26 min, hypothermic, rib fx from CPR. Intubated 10/17, failed extubation 10/23; trach placed 10/25. Pt with new HFpEF. Rapid response 11/4 due to recurrent desaturation with L mucous plugging and small effusion; transfer back to ICU 11/5-11/8. IR consulted for PEG 11/7. PMH includes bipolar disorder.    PT Comments    Pt continues to easily be distracted and needs redirecting to remain on task. However, pt was able to progress to ambulating up to ~12 ft with a RW and min-modAx2 today, demonstrating continued deficits in balance and core and lower extremity strength. She was able to mobilize on RA with PMV on with SpO2 >/= 89%. She expressed strong desire to be able to eat again. Will continue to follow acutely. Current recommendations remain appropriate.    Recommendations for follow up therapy are one component of a multi-disciplinary discharge planning process, led by the attending physician.  Recommendations may be updated based on patient status, additional functional criteria and insurance authorization.  Follow Up Recommendations  Acute inpatient rehab (3hours/day) Can patient physically be transported by private vehicle: Yes   Assistance Recommended at Discharge Frequent or constant Supervision/Assistance  Patient can return home with the following Direct supervision/assist for medications management;Help with stairs or ramp for entrance;Assist for transportation;Assistance with cooking/housework;Direct supervision/assist for financial management;Assistance with feeding;A lot of help with walking and/or transfers;A lot of help with bathing/dressing/bathroom   Equipment Recommendations  Wheelchair (measurements PT);Hospital bed;Wheelchair cushion (measurements PT)     Recommendations for Other Services       Precautions / Restrictions Precautions Precautions: Fall;Other (comment) Precaution Comments: cortrak, trach collar, multiple wounds along spine and sacrum Restrictions Weight Bearing Restrictions: No     Mobility  Bed Mobility Overal bed mobility: Needs Assistance Bed Mobility: Supine to Sit     Supine to sit: Min assist, HOB elevated     General bed mobility comments: MinA with pt pulling on therapist's hand to ascend trunk to sit up L EOB, HOB elevated    Transfers Overall transfer level: Needs assistance Equipment used: Rolling walker (2 wheels) Transfers: Sit to/from Stand Sit to Stand: Min assist, +2 physical assistance, +2 safety/equipment           General transfer comment: MinAx2 to power up to stand and steady pt, x1 from EOB and x1 from recliner. Pt initially trying to stand with feet placed anteriorly and touching each other, needing cues to widen stance and bring feet posteriorly. Needs cues for hand placement.    Ambulation/Gait Ambulation/Gait assistance: Min assist, Mod assist, +2 safety/equipment, +2 physical assistance Gait Distance (Feet): 12 Feet Assistive device: Rolling walker (2 wheels) Gait Pattern/deviations: Step-through pattern, Decreased stride length, Trunk flexed, Narrow base of support Gait velocity: reduced Gait velocity interpretation: <1.31 ft/sec, indicative of household ambulator   General Gait Details: Pt with small, unsteady, narrow steps anteriorly from bed to door in room with chair follow. Pt cued to improve upright posture and widen stance, mod success noted. Min-modAx2 for stability and safety throughout   Stairs             Wheelchair Mobility    Modified Rankin (Stroke Patients Only)       Balance Overall balance assessment: Needs assistance Sitting-balance support: Feet supported Sitting balance-Leahy Scale: Fair Sitting balance - Comments: Pt with  intermittent  LOB initially sitting EOB, benefiting from UE support and min guard-minA.   Standing balance support: No upper extremity supported, Bilateral upper extremity supported, Single extremity supported, During functional activity Standing balance-Leahy Scale: Poor Standing balance comment: Reliant on up to modA for static standing balance without UE support, reliant on 1 UE support and up to minA for reaching off BOS at sink, 2 UE support and external physical assistance for gait                            Cognition Arousal/Alertness: Awake/alert Behavior During Therapy: Anxious, Restless Overall Cognitive Status: Impaired/Different from baseline Area of Impairment: Attention, Memory, Awareness, Problem solving, Following commands, Safety/judgement                   Current Attention Level: Selective Memory: Decreased short-term memory Following Commands: Follows one step commands with increased time, Follows one step commands consistently, Follows multi-step commands inconsistently Safety/Judgement: Decreased awareness of deficits, Decreased awareness of safety Awareness: Emergent Problem Solving: Slow processing, Difficulty sequencing, Requires verbal cues General Comments: Pt easily distracted in regards to her needs. Pt needing frequent redirection and needs to be told the plan to keep her on task to progress her mobility. Pt upset at times regarding her medical situation and family dynamics, but able to redirect with cues to initiate mobility. Pt forgetful that this therapist tried to help her find a phone charger 15 min earlier.        Exercises      General Comments General comments (skin integrity, edema, etc.): SpO2 >/= 89% on RA with PMV donned, returned to 10L 60% FiO2 via trach collar end of session      Pertinent Vitals/Pain Pain Assessment Pain Assessment: Faces Faces Pain Scale: Hurts little more Pain Location: generalized, buttocks Pain Descriptors /  Indicators: Discomfort, Grimacing Pain Intervention(s): Limited activity within patient's tolerance, Monitored during session, Repositioned, Premedicated before session, Patient requesting pain meds-RN notified    Home Living                          Prior Function            PT Goals (current goals can now be found in the care plan section) Acute Rehab PT Goals Patient Stated Goal: to be able to walk out of here PT Goal Formulation: With patient Time For Goal Achievement: 06/25/22 Potential to Achieve Goals: Fair Progress towards PT goals: Progressing toward goals    Frequency    Min 3X/week      PT Plan Current plan remains appropriate    Co-evaluation PT/OT/SLP Co-Evaluation/Treatment: Yes Reason for Co-Treatment: Necessary to address cognition/behavior during functional activity;For patient/therapist safety;To address functional/ADL transfers PT goals addressed during session: Mobility/safety with mobility;Balance;Proper use of DME        AM-PAC PT "6 Clicks" Mobility   Outcome Measure  Help needed turning from your back to your side while in a flat bed without using bedrails?: A Little Help needed moving from lying on your back to sitting on the side of a flat bed without using bedrails?: A Little Help needed moving to and from a bed to a chair (including a wheelchair)?: A Lot Help needed standing up from a chair using your arms (e.g., wheelchair or bedside chair)?: A Lot Help needed to walk in hospital room?: Total Help needed climbing 3-5 steps with a railing? : Total  6 Click Score: 12    End of Session Equipment Utilized During Treatment: Gait belt;Oxygen Activity Tolerance: Patient tolerated treatment well Patient left: in chair;with call bell/phone within reach;with chair alarm set Nurse Communication: Mobility status;Patient requests pain meds;Other (comment) (sats) PT Visit Diagnosis: Other abnormalities of gait and mobility (R26.89);Muscle  weakness (generalized) (M62.81);Unsteadiness on feet (R26.81);Other symptoms and signs involving the nervous system (R29.898);Difficulty in walking, not elsewhere classified (R26.2)     Time: 1025-8527 PT Time Calculation (min) (ACUTE ONLY): 34 min  Charges:  $Gait Training: 8-22 mins                     Raymond Gurney, PT, DPT Acute Rehabilitation Services  Office: [phone]    Jewel Baize 06/12/2022, 12:19 PM

## 2022-06-12 NOTE — Progress Notes (Signed)
Speech Language Pathology Treatment: Dysphagia  Patient Details Name: Monica Hopkins MRN: [id number] DOB: November 02, 1970 Today's Date: 06/12/2022 Time: 3329-5188 SLP Time Calculation (min) (ACUTE ONLY): 10 min  Assessment / Plan / Recommendation Clinical Impression  Pt was seen for dysphagia treatment to discuss her performance during the study and recommendation. She verbalized understanding, but required reinforcement of why more viscous consistencies were more challenging. Pt was excited about being able to have Dr. Reino Kent and had question of whether she could also have cream of broccoli soup or a caramel frappe. Pt was advised that a caramel frappe is not on a clear liquid diet, but that she would be able to have this from a swallowing standpoint if swallowing precautions are observed. Pt was able to verbalize her swallowing precautions and reported that she will observe them. It was agreed that pt's diet will be modified to a full liquid diet to allow non-clear liquids, but that puree consistencies would still be restricted at least over the weekend until SLP can monitor her more closely. Pt verbalized agreement with this plan. SLP will continue to follow pt.     HPI HPI: Pt is a 51 y.o. female admitted 05/19/22 after cardiac arrest at home, ROSC 26 min, hypothermic. ETT 10/17-10/23; reintubated 10/23-trach10/25. Pt with new HFpEF. MRI brain negative for acute changes. CXR 10/30: Stable multifocal pulmonary infiltrates and small left pleural effusion. MBS 11/3 rx NPO; G-tube being considered and IR consulted, but per radiology, anatomy is highly unfavorable and a relative contraindication. PMH: bipolar disorder.      SLP Plan         Recommendations for follow up therapy are one component of a multi-disciplinary discharge planning process, led by the attending physician.  Recommendations may be updated based on patient status, additional functional criteria and insurance authorization.     Recommendations  Diet recommendations: Thin liquid (full liquids; no purees) Liquids provided via: Cup;Straw Medication Administration: Via alternative means Supervision: Full supervision/cueing for compensatory strategies;Staff to assist with self feeding Compensations: Slow rate;Small sips/bites;Chin tuck Postural Changes and/or Swallow Maneuvers: Seated upright 90 degrees;Chin tuck      Patient may use Passy-Muir Speech Valve: During all therapies with supervision;During PO intake/meals PMSV Supervision: Intermittent         Oral Care Recommendations: Oral care BID Follow Up Recommendations: Acute inpatient rehab (3hours/day) Assistance recommended at discharge: Frequent or constant Supervision/Assistance SLP Visit Diagnosis: Dysphagia, pharyngeal phase (R13.13)         Emmy Keng I. Vear Clock, MS, CCC-SLP Acute Rehabilitation Services Office number [phone]  Scheryl Marten  06/12/2022, 4:33 PM

## 2022-06-12 NOTE — Progress Notes (Signed)
Received call from CCMD notified pt's saturation dropped down to 76 % with a good wave. Pt complained of SOB. RN advised pt from eating, removed passy muir valve. Increased oxygen from 10L to 15L. Called RT and Rapid response nurse.   Lawson Radar, RN

## 2022-06-13 DIAGNOSIS — Z930 Tracheostomy status: Secondary | ICD-10-CM

## 2022-06-13 DIAGNOSIS — I5032 Chronic diastolic (congestive) heart failure: Secondary | ICD-10-CM | POA: Insufficient documentation

## 2022-06-13 DIAGNOSIS — S2239XA Fracture of one rib, unspecified side, initial encounter for closed fracture: Secondary | ICD-10-CM

## 2022-06-13 LAB — GLUCOSE, CAPILLARY
Glucose-Capillary: 103 mg/dL — ABNORMAL HIGH (ref 70–99)
Glucose-Capillary: 121 mg/dL — ABNORMAL HIGH (ref 70–99)
Glucose-Capillary: 89 mg/dL (ref 70–99)
Glucose-Capillary: 91 mg/dL (ref 70–99)
Glucose-Capillary: 93 mg/dL (ref 70–99)

## 2022-06-13 MED ORDER — LACTATED RINGERS IV BOLUS
500.0000 mL | Freq: Once | INTRAVENOUS | Status: AC
  Administered 2022-06-13: 500 mL via INTRAVENOUS

## 2022-06-13 NOTE — Progress Notes (Signed)
Pt desating upon ambulating to the [facility]. Pt became symptomatic and lethargic. Pt had a large loose bowel movement  Pt Transferred to and from [facility] to bed safely  O2 turned up to compensate desaturation  Pt O2 regulated once back in Bed. Call bell within reach, pt still lethargic/pain meds were given prior to ambulating  Will continue to monitor

## 2022-06-13 NOTE — Progress Notes (Addendum)
Progress Note   Patient: Monica Hopkins [id number]:[id number] DOB: 1970-11-24 DOA: 05/19/2022     25 DOS: the patient was seen and examined on 06/13/2022   Brief hospital course: 51 year old presented after cardiac arrest.  Found to be hypothermic and intubated in the emergency department. Significant Events 10/17: admitted post cardiac arrest; ROSC 26 minutes 10/20: off pressors, will awaken agitated, desaturates SBT.  10/23 failed extubation trial 10/25 trach 06/04/2022 she was transferred to the [facility] service 06/04/22 she desaturated on trach collar and PCCM was reconsulted. 06/05/22.  Continues to fail at swallowing and will need alternate means of feeding and she is resistant to PEG tube.  Consulted palliative care for further goals of care discussion and will also need a psychiatric evaluation 06/06/22. rapid response called given desaturation due to L mucous plugging and small effusion 06/07/22. Transferred to ICU after recurrent saturation.  She was refusing chest vest, metaneb.   06/08/22. Tolerating trach collar, accepting meds, c/o pain 06/09/22  IR consulted for PEG, unable to secondary to anatomy.  General surgery recommending conservative management, core track, diet as tolerated. 11/10 started on liquid diet.  Did poorly the first day, was noncompliant. 11/11 n.p.o. given poor compliance with proper diet, respiratory status decline, if improved 11/12, likely restart diet.  Assessment and Plan: S/p cardiac arrest found to be in PEA/shockable rhythm, in the setting of hypoxia due to pneumococcal pneumonia    Acute hypoxic respiratory failure due to Strep pneumonia pneumonia, complicated with aspiration and healthcare associated pneumonia with Enterobacter, Klebsiella and stenotrophomonas  --S/p trach, Intermittent mucous plugging. On trach collar. Poor cough mechanics, deconditioned. Anxious and with rib pain  --continue nebs --10/23 BAL with klebsiella and enterobacter cloacae, 10/28  tracheal aspirate with enterobacter cloacae and stenotrophomonas, 11/4 tracheal aspirate with rare normal resp flora. Continue bactrim 11/2 - present, complete 2 weeks -SLP working with Passy-Muir valve --Poor adherence to diet 11/11 resulting in worsening hypoxia.  Appears to be stabilized.  Chest x-ray no acute findings.   New diagnosis of acute HFpEF  --echo with EF 55%, no RWMA, grade 1 diastolic dysfunction, d shaped septum suggesting RV pressure/volume overload   Severe protein calorie malnutrition --currently with Cortrak --PEG considered, but would require open procedure. Discussed w/ CCS, aware of RD and ST thoughts about PEG, but given operative risk and improvement in swallow, now on a diet, CCS does not think benefit outweighs risk. No surgical placement recommended at this point per CCS. -- However patient did poorly the first day with liquids.  N.p.o. today.  Retrial liquids tomorrow if stable.   Acute septic/toxic encephalopathy --resolved   Acute rhabdomyolysis, resolved Hyperkalemia/Hypokalemia, resolved AKI due to septic ATN, rhabdomyolysis Hypernatremia --Creatinine stable   Anxiety depression bipolar  --psychiatry recommendations from 11/4 --seroquel 50 mg TID prn for agitation only, clonazepam 0.5 mg in the AM, 2 mg qhs, 0.5 mg BID prn.  Continue depakote 250 mg BID (this was later increased 500 mg BID).  Trazodone 50 mg qhs prn.   Rib fx from CPR --Tylenol, dilaudid prn, gabapentin   Acute blood loss anemia on anemia of chronic disease --Patient hemoglobin has stabilized now --iron panel suggestive of chronic disease   Thrombocytosis -trend   Hypoalbuminemia - follow        Subjective:   RR note reviewed Per RN, pt was started on full liquid diet today after passing her MBS study. She was eating this evening and began to drop her oxygen to mid 70s on .40 TC. Bedside  RNs took passey muir valve off, suctioned trach, and oxygen via TC was increased. RT  then arrived and changed out inner cannula and increased oxygen further.    Feels ok today  Physical Exam: Vitals:   06/13/22 1632 06/13/22 1657 06/13/22 1700 06/13/22 1705  BP: 97/74 100/66 92/61   Pulse: 84 93 90 91  Resp: (!) _0 Temp:  98.5 F (36.9 C)    TempSrc:  Oral    SpO2: 98% 98% 99% 97%  Weight:      Height:       Physical Exam Vitals reviewed.  Constitutional:      General: She is not in acute distress.    Appearance: She is ill-appearing. She is not toxic-appearing.  Cardiovascular:     Rate and Rhythm: Normal rate and regular rhythm.     Heart sounds: No murmur heard. Pulmonary:     Effort: Pulmonary effort is normal. No respiratory distress.     Breath sounds: No wheezing, rhonchi or rales.     Comments: Some audible upper airway secretions Neurological:     Mental Status: She is alert.  Psychiatric:        Mood and Affect: Mood is anxious.        Speech: Speech normal.        Behavior: Behavior normal.     Data Reviewed: CBG stable  Family Communication: mother and father at bedside  Disposition: Status is: Inpatient Remains inpatient appropriate because: inadequate nutrition  Planned Discharge Destination:  CIR?    Time spent: 20 minutes  Author: Murray Hodgkins, MD 06/13/2022 6:20 PM  For on call review [email or web address].

## 2022-06-14 DIAGNOSIS — R1310 Dysphagia, unspecified: Secondary | ICD-10-CM

## 2022-06-14 LAB — GLUCOSE, CAPILLARY
Glucose-Capillary: 91 mg/dL (ref 70–99)
Glucose-Capillary: 101 mg/dL — ABNORMAL HIGH (ref 70–99)
Glucose-Capillary: 103 mg/dL — ABNORMAL HIGH (ref 70–99)
Glucose-Capillary: 99 mg/dL (ref 70–99)
Glucose-Capillary: 111 mg/dL — ABNORMAL HIGH (ref 70–99)

## 2022-06-14 MED ORDER — MAGIC MOUTHWASH
5.0000 mL | Freq: Three times a day (TID) | ORAL | Status: DC
  Administered 2022-06-14 – 2022-06-24 (×30): 5 mL via ORAL
  Filled 2022-06-14 (×34): qty 5

## 2022-06-14 NOTE — Progress Notes (Signed)
Progress Note   Patient: Monica Hopkins [id number] DOB: 08/18/1970 DOA: 05/19/2022     26 DOS: the patient was seen and examined on 06/14/2022   Brief hospital course: 51 year old presented after cardiac arrest.  Found to be hypothermic and intubated in the emergency department. Significant Events 10/17: admitted post cardiac arrest; ROSC 26 minutes 10/20: off pressors, will awaken agitated, desaturates SBT.  10/23 failed extubation trial 10/25 trach 06/04/2022 she was transferred to the [facility] service 06/04/22 she desaturated on trach collar and PCCM was reconsulted. 06/05/22.  Continues to fail at swallowing and will need alternate means of feeding and she is resistant to PEG tube.  Consulted palliative care for further goals of care discussion and will also need a psychiatric evaluation 06/06/22. rapid response called given desaturation due to L mucous plugging and small effusion 06/07/22. Transferred to ICU after recurrent saturation.  She was refusing chest vest, metaneb.   06/08/22. Tolerating trach collar, accepting meds, c/o pain 06/09/22  IR consulted for PEG, unable to secondary to anatomy.  General surgery recommending conservative management, core track, diet as tolerated. 11/10 started on liquid diet.  Did poorly the first day, was noncompliant. 11/11 n.p.o. given poor compliance with proper diet, respiratory status decline, if improved 11/12, likely restart diet.  Assessment and Plan: S/p cardiac arrest found to be in PEA/shockable rhythm, in the setting of hypoxia due to pneumococcal pneumonia    Acute hypoxic respiratory failure due to Strep pneumonia pneumonia, complicated with aspiration and healthcare associated pneumonia with Enterobacter, Klebsiella and stenotrophomonas  --S/p trach, Intermittent mucous plugging. On trach collar. Poor cough mechanics, deconditioned. Anxious and with rib pain  --continue nebs --10/23 BAL with klebsiella and enterobacter cloacae, 10/28  tracheal aspirate with enterobacter cloacae and stenotrophomonas, 11/4 tracheal aspirate with rare normal resp flora. Continue bactrim 11/2 - present, complete 2 weeks -SLP working with Passy-Muir valve --Poor adherence to diet 11/11 resulting in worsening hypoxia.  Appears to be stabilized.  Chest x-ray no acute findings. --restart liquid diet, patient reports will comply with directions   New diagnosis of acute HFpEF  --echo with EF 55%, no RWMA, grade 1 diastolic dysfunction, d shaped septum suggesting RV pressure/volume overload   Severe protein calorie malnutrition --currently with Cortrak, now on liquid diet --PEG considered, but would require open procedure. Discussed w/ CCS, aware of RD and ST thoughts about PEG, but given operative risk and improvement in swallow, now on a diet, CCS does not think benefit outweighs risk. No surgical placement recommended at this point per CCS.   Acute septic/toxic encephalopathy --resolved   Acute rhabdomyolysis, resolved Hyperkalemia/Hypokalemia, resolved AKI due to septic ATN, rhabdomyolysis, resolved Hypernatremia --Creatinine stable   Anxiety depression bipolar  --psychiatry recommendations from 11/4 --Seroquel 50 mg TID prn for agitation only, clonazepam 0.5 mg in the AM, 2 mg qhs, 0.5 mg BID prn.  Continue depakote 250 mg BID (this was later increased 500 mg BID).  Trazodone 50 mg qhs prn.   Rib fx from CPR --Tylenol, dilaudid prn, gabapentin   Acute blood loss anemia on anemia of chronic disease --Patient hemoglobin has stabilized now --iron panel suggestive of chronic disease   Thrombocytosis -trend   Hypoalbuminemia - follow        Subjective:  Feels better  Physical Exam: Vitals:   06/14/22 0427 06/14/22 0704 06/14/22 0845 06/14/22 1125  BP:  104/73    Pulse:  88    Resp:  12    Temp:  97.9 F (36.6 C)  TempSrc:  Oral    SpO2:  96% 95% 91%  Weight: 49 kg     Height:       Physical Exam Vitals reviewed.   Constitutional:      General: She is not in acute distress.    Appearance: She is not ill-appearing or toxic-appearing.  Cardiovascular:     Rate and Rhythm: Normal rate and regular rhythm.     Heart sounds: No murmur heard.    Comments: Telemetry SR Pulmonary:     Effort: Pulmonary effort is normal. No respiratory distress.     Breath sounds: No wheezing, rhonchi or rales.  Musculoskeletal:     Right lower leg: No edema.     Left lower leg: No edema.  Neurological:     Mental Status: She is alert.  Psychiatric:        Mood and Affect: Mood normal.        Behavior: Behavior normal.     Data Reviewed: CBG stable  Family Communication: none  Disposition: Status is: Inpatient Remains inpatient appropriate because: needs definitive nutrition route  Planned Discharge Destination:  CIR    Time spent: 20 minutes  Author: Murray Hodgkins, MD 06/14/2022 12:14 PM  For on call review [email or web address].

## 2022-06-14 NOTE — Progress Notes (Signed)
Pt woke up and requested to wear her passey muir valve. She talked on the phone w/ a friend for approx 30 minutes and tolerated this well. After changing her briefs and giving pain meds, I took valve off as pt was getting ready to go back to sleep. She vocalized understanding of when to use valve and when to take it off. Sats >98% the whole time on 5L trach collar.

## 2022-06-15 LAB — GLUCOSE, CAPILLARY
Glucose-Capillary: 102 mg/dL — ABNORMAL HIGH (ref 70–99)
Glucose-Capillary: 104 mg/dL — ABNORMAL HIGH (ref 70–99)
Glucose-Capillary: 107 mg/dL — ABNORMAL HIGH (ref 70–99)
Glucose-Capillary: 124 mg/dL — ABNORMAL HIGH (ref 70–99)
Glucose-Capillary: 93 mg/dL (ref 70–99)

## 2022-06-15 NOTE — Progress Notes (Signed)
Speech Language Pathology Treatment: Dysphagia  Patient Details Name: Monica Hopkins MRN: [id number] DOB: [birth year] Today's Date: 06/15/2022 Time: 5329-9242 SLP Time Calculation (min) (ACUTE ONLY): 11 min  Assessment / Plan / Recommendation Clinical Impression  Monica Hopkins had an aspiration event on Friday evening with desaturation and observed grape juice being expelled from trach. Orders to resume full liquid diet were written yesterday, 11/12.  Today she was encountered lethargic, reclined in bed with PMV in place, sipping her drink but forgetting to tuck her chin. With staff help, she was repositioned upright. She drank several sips of her [name], requiring cues to tuck her chin.  There were no overt concerns for aspiration.   Pt was only willing to consume limited sips of her drink today. After reviewing both MBS studies, it is likely that recovery of swallow function is going to be a long process. The mechanics of her swallowing significantly impair her ability to propel any solid foods through her pharynx.   SLP will continue to follow.  Pt verbalized understanding of her swallowing precautions.    HPI HPI: Pt is a 51 y.o. female admitted 05/19/22 after cardiac arrest at home, ROSC 26 min, hypothermic. ETT 10/17-10/23; reintubated 10/23-trach10/25. Pt with new HFpEF. MRI brain negative for acute changes. CXR 10/30: Stable multifocal pulmonary infiltrates and small left pleural effusion. MBS 11/3 rx NPO; G-tube being considered and IR consulted, but per radiology, anatomy is highly unfavorable and a relative contraindication. PMH: bipolar disorder.      SLP Plan  Continue with current plan of care      Recommendations for follow up therapy are one component of a multi-disciplinary discharge planning process, led by the attending physician.  Recommendations may be updated based on patient status, additional functional criteria and insurance authorization.    Recommendations   Diet recommendations: Other(comment) (thin liquids) Liquids provided via: Cup;Straw Medication Administration: Via alternative means Supervision: Full supervision/cueing for compensatory strategies;Staff to assist with self feeding Compensations: Slow rate;Small sips/bites;Chin tuck Postural Changes and/or Swallow Maneuvers: Seated upright 90 degrees;Chin tuck      Patient may use Passy-Muir Speech Valve: During all therapies with supervision;During PO intake/meals PMSV Supervision: Intermittent         Oral Care Recommendations: Oral care BID Follow Up Recommendations: Acute inpatient rehab (3hours/day) Assistance recommended at discharge: Frequent or constant Supervision/Assistance SLP Visit Diagnosis: Dysphagia, pharyngeal phase (R13.13) Plan: Continue with current plan of care        Monica Schmoker L. Monica Frederic, MA CCC/SLP Clinical Specialist - Acute Care SLP Acute Rehabilitation Services Office number [phone]   Monica Hopkins Monica Hopkins  06/15/2022, 10:48 AM

## 2022-06-15 NOTE — Plan of Care (Signed)
  Problem: Education: Goal: Knowledge of General Education information will improve Description: Including pain rating scale, medication(s)/side effects and non-pharmacologic comfort measures Outcome: Progressing   Problem: Clinical Measurements: Goal: Respiratory complications will improve Outcome: Progressing   Problem: Nutrition: Goal: Adequate nutrition will be maintained Outcome: Progressing   

## 2022-06-15 NOTE — Progress Notes (Signed)
Physical Therapy Treatment Patient Details Name: Monica Hopkins MRN: [id number] DOB: 1971-01-30 Today's Date: 06/15/2022   History of Present Illness Pt is a 51 y.o. female admitted 05/19/22 with cardiac arrest at home, ROSC 26 min, hypothermic, rib fx from CPR. Intubated 10/17, failed extubation 10/23; trach placed 10/25. Pt with new HFpEF. Rapid response 11/4 due to recurrent desaturation with L mucous plugging and small effusion; transfer back to ICU 11/5-11/8. IR consulted for PEG 11/7. PMH includes bipolar disorder.    PT Comments    Pt progressing towards her physical therapy goals and is agreeable to participate. Demonstrates improved activity tolerance and ambulation distance. Pt ambulating 20 ft with a walker and min assist. SpO2 90% on RA, HR peak 120 bpm. Continues with deconditioning, impaired standing balance, decreased endurance, and decreased skin integrity. Continue to recommend acute inpatient rehab (AIR) for post-acute therapy needs.    Recommendations for follow up therapy are one component of a multi-disciplinary discharge planning process, led by the attending physician.  Recommendations may be updated based on patient status, additional functional criteria and insurance authorization.  Follow Up Recommendations  Acute inpatient rehab (3hours/day) Can patient physically be transported by private vehicle: Yes   Assistance Recommended at Discharge Frequent or constant Supervision/Assistance  Patient can return home with the following Direct supervision/assist for medications management;Help with stairs or ramp for entrance;Assist for transportation;Assistance with cooking/housework;Direct supervision/assist for financial management;Assistance with feeding;A lot of help with walking and/or transfers;A lot of help with bathing/dressing/bathroom   Equipment Recommendations  Wheelchair (measurements PT);Hospital bed;Wheelchair cushion (measurements PT)    Recommendations for  Other Services       Precautions / Restrictions Precautions Precautions: Fall;Other (comment) Precaution Comments: cortrak, trach collar, multiple wounds along spine and sacrum Restrictions Weight Bearing Restrictions: No     Mobility  Bed Mobility Overal bed mobility: Needs Assistance Bed Mobility: Supine to Sit, Rolling Rolling: Min assist   Supine to sit: Min assist, HOB elevated     General bed mobility comments: Pt bringing BLE's off edge of bed, minA at trunk to sit upright    Transfers Overall transfer level: Needs assistance Equipment used: Rolling walker (2 wheels) Transfers: Sit to/from Stand Sit to Stand: Min assist, +2 safety/equipment           General transfer comment: cues for wider stance, assist to power up    Ambulation/Gait Ambulation/Gait assistance: Min assist, +2 safety/equipment Gait Distance (Feet): 20 Feet Assistive device: Rolling walker (2 wheels) Gait Pattern/deviations: Step-through pattern, Decreased stride length, Trunk flexed, Narrow base of support Gait velocity: decreased Gait velocity interpretation: <1.8 ft/sec, indicate of risk for recurrent falls   General Gait Details: Small, unsteady steps, assist for balance and steering RW. Chair follow utilized, but not needed   Social research officer, government Rankin (Stroke Patients Only)       Balance Overall balance assessment: Needs assistance Sitting-balance support: Feet supported Sitting balance-Leahy Scale: Fair     Standing balance support: Bilateral upper extremity supported, During functional activity Standing balance-Leahy Scale: Poor Standing balance comment: reliant on RW and minA                            Cognition Arousal/Alertness: Awake/alert Behavior During Therapy: Anxious, Restless Overall Cognitive Status: Impaired/Different from baseline Area of Impairment: Attention, Memory, Awareness, Problem solving,  Following commands, Safety/judgement  Current Attention Level: Selective Memory: Decreased short-term memory Following Commands: Follows one step commands with increased time, Follows one step commands consistently, Follows multi-step commands inconsistently Safety/Judgement: Decreased awareness of deficits, Decreased awareness of safety Awareness: Emergent Problem Solving: Slow processing, Difficulty sequencing, Requires verbal cues General Comments: Needs encouragement and direct instructions for initiating movement. Easily distracted        Exercises      General Comments        Pertinent Vitals/Pain Pain Assessment Pain Assessment: Faces Faces Pain Scale: Hurts even more Pain Location: generalized, neck Pain Descriptors / Indicators: Grimacing Pain Intervention(s): Limited activity within patient's tolerance, Monitored during session, Patient requesting pain meds-RN notified    Home Living                          Prior Function            PT Goals (current goals can now be found in the care plan section) Acute Rehab PT Goals Patient Stated Goal: to be able to walk out of here Potential to Achieve Goals: Fair Progress towards PT goals: Progressing toward goals    Frequency    Min 3X/week      PT Plan Current plan remains appropriate    Co-evaluation              AM-PAC PT "6 Clicks" Mobility   Outcome Measure  Help needed turning from your back to your side while in a flat bed without using bedrails?: A Little Help needed moving from lying on your back to sitting on the side of a flat bed without using bedrails?: A Little Help needed moving to and from a bed to a chair (including a wheelchair)?: A Little Help needed standing up from a chair using your arms (e.g., wheelchair or bedside chair)?: A Little Help needed to walk in hospital room?: A Little Help needed climbing 3-5 steps with a railing? : Total 6 Click  Score: 16    End of Session Equipment Utilized During Treatment: Gait belt;Oxygen Activity Tolerance: Patient tolerated treatment well Patient left: in chair;with call bell/phone within reach;with chair alarm set Nurse Communication: Mobility status PT Visit Diagnosis: Other abnormalities of gait and mobility (R26.89);Muscle weakness (generalized) (M62.81);Unsteadiness on feet (R26.81);Other symptoms and signs involving the nervous system (R29.898);Difficulty in walking, not elsewhere classified (R26.2)     Time: 1004-1030 PT Time Calculation (min) (ACUTE ONLY): 26 min  Charges:  $Therapeutic Activity: 23-37 mins                     Lillia Pauls, PT, DPT Acute Rehabilitation Services Office [phone]    Norval Morton 06/15/2022, 1:19 PM

## 2022-06-15 NOTE — Plan of Care (Signed)
  Problem: Education: Goal: Knowledge of General Education information will improve Description: Including pain rating scale, medication(s)/side effects and non-pharmacologic comfort measures Outcome: Not Progressing   Problem: Health Behavior/Discharge Planning: Goal: Ability to manage health-related needs will improve Outcome: Not Progressing   

## 2022-06-15 NOTE — Progress Notes (Signed)
Progress Note   Patient: Monica Hopkins TKZ:[id number] DOB: 01/17/1971 DOA: 05/19/2022     27 DOS: the patient was seen and examined on 06/15/2022   Brief hospital course: 51 year old presented after cardiac arrest.  Found to be hypothermic and intubated in the emergency department. Significant Events 10/17: admitted post cardiac arrest; ROSC 26 minutes 10/20: off pressors, will awaken agitated, desaturates SBT.  10/23 failed extubation trial 10/25 trach 06/04/2022 she was transferred to the [facility] service 06/04/22 she desaturated on trach collar and PCCM was reconsulted. 06/05/22.  Continues to fail at swallowing and will need alternate means of feeding and she is resistant to PEG tube.  Consulted palliative care for further goals of care discussion and will also need a psychiatric evaluation 06/06/22. rapid response called given desaturation due to L mucous plugging and small effusion 06/07/22. Transferred to ICU after recurrent saturation.  She was refusing chest vest, metaneb.   06/08/22. Tolerating trach collar, accepting meds, c/o pain 06/09/22  IR consulted for PEG, unable to secondary to anatomy.  General surgery recommending conservative management, core track, diet as tolerated. 11/10 started on liquid diet.  Did poorly the first day, was noncompliant. 11/11 n.p.o. given poor compliance with proper diet, respiratory status decline, made NPO 11/12-13 tolerating liquid diet  Assessment and Plan: S/p cardiac arrest found to be in PEA/shockable rhythm, in the setting of hypoxia due to pneumococcal pneumonia    Acute hypoxic respiratory failure due to Strep pneumonia pneumonia, complicated with aspiration and healthcare associated pneumonia with Enterobacter, Klebsiella and stenotrophomonas  --S/p trach, Intermittent mucous plugging. On trach collar. Poor cough mechanics, deconditioned. Anxious and with rib pain  --continue nebs --10/23 BAL with klebsiella and enterobacter cloacae, 10/28  tracheal aspirate with enterobacter cloacae and stenotrophomonas, 11/4 tracheal aspirate with rare normal resp flora. Continue bactrim 11/2 - present, complete 2 weeks -SLP working with Passy-Muir valve --Poor adherence to diet 11/11 resulting in worsening hypoxia.  Appears to be stabilized.  Chest x-ray no acute findings. --11/13 tolerating liquid diet   New diagnosis of acute HFpEF  --echo with EF 55%, no RWMA, grade 1 diastolic dysfunction, d shaped septum suggesting RV pressure/volume overload   Severe protein calorie malnutrition --currently with Cortrak, and tolerating liquid diet --PEG considered, but would require open procedure. Discussed w/ CCS, aware of RD and ST thoughts about PEG, but given operative risk and improvement in swallow, now on a diet, CCS does not think benefit outweighs risk. No surgical placement recommended at this point per CCS.   Acute septic/toxic encephalopathy --resolved   Acute rhabdomyolysis, resolved Hyperkalemia/hypokalemia, resolved AKI due to septic ATN, rhabdomyolysis, resolved Hypernatremia --Creatinine stable   Anxiety depression bipolar  --psychiatry recommendations from 11/4 --Seroquel 50 mg TID prn for agitation only, clonazepam 0.5 mg in the AM, 2 mg qhs, 0.5 mg BID prn.  Continue depakote 250 mg BID (this was later increased 500 mg BID).  Trazodone 50 mg qhs prn.   Rib fx from CPR --Tylenol, Dilaudid prn, gabapentin   Acute blood loss anemia on anemia of chronic disease --Patient hemoglobin has stabilized now --iron panel suggestive of chronic disease   Thrombocytosis -trend   Hypoalbuminemia - follow        Subjective:  Feels ok today  Physical Exam: Vitals:   06/15/22 1110 06/15/22 1200 06/15/22 1251 06/15/22 1541  BP:   100/67   Pulse: (!) 115 100 (!) 103 87  Resp: 20     Temp:   98.9 F (37.2 C)  TempSrc:   Oral   SpO2: 92% 91%  93%  Weight:      Height:       Physical Exam Vitals reviewed.   Constitutional:      General: She is not in acute distress.    Appearance: She is not ill-appearing or toxic-appearing.  Cardiovascular:     Rate and Rhythm: Normal rate and regular rhythm.     Heart sounds: No murmur heard. Pulmonary:     Effort: Pulmonary effort is normal. No respiratory distress.     Breath sounds: No wheezing, rhonchi or rales.  Neurological:     Mental Status: She is alert.  Psychiatric:        Mood and Affect: Mood normal.        Behavior: Behavior normal.     Data Reviewed: CBG stable  Family Communication:   Disposition: Status is: Inpatient Remains inpatient appropriate because: needs to improve nutrition before transfer to CIR  Planned Discharge Destination:  CIR    Time spent: 20 minutes  Author: Murray Hodgkins, MD 06/15/2022 7:40 PM  For on call review [email or web address].

## 2022-06-15 NOTE — Progress Notes (Signed)
Inpatient Rehab Admissions Coordinator:   Met with patient and son. She is interested in getting well and going home. She will need to have her diet straightened out prior to admission to CIR. Will continue to follow for potential CIR admission. Parents will be able to assist her at home.   Rehab Admissons Coordinator [place], [place], [place] [phone]

## 2022-06-16 LAB — GLUCOSE, CAPILLARY
Glucose-Capillary: 87 mg/dL (ref 70–99)
Glucose-Capillary: 107 mg/dL — ABNORMAL HIGH (ref 70–99)
Glucose-Capillary: 111 mg/dL — ABNORMAL HIGH (ref 70–99)
Glucose-Capillary: 117 mg/dL — ABNORMAL HIGH (ref 70–99)
Glucose-Capillary: 97 mg/dL (ref 70–99)

## 2022-06-16 MED ORDER — ALBUTEROL SULFATE (2.5 MG/3ML) 0.083% IN NEBU
2.5000 mg | INHALATION_SOLUTION | Freq: Four times a day (QID) | RESPIRATORY_TRACT | Status: DC | PRN
  Administered 2022-06-18: 2.5 mg via RESPIRATORY_TRACT
  Filled 2022-06-16: qty 3

## 2022-06-16 MED ORDER — MORPHINE SULFATE (PF) 2 MG/ML IV SOLN
1.0000 mg | INTRAVENOUS | Status: DC | PRN
  Administered 2022-06-19 (×3): 1 mg via INTRAVENOUS
  Filled 2022-06-16 (×4): qty 1

## 2022-06-16 MED ORDER — KETOROLAC TROMETHAMINE 15 MG/ML IJ SOLN
15.0000 mg | Freq: Four times a day (QID) | INTRAMUSCULAR | Status: DC | PRN
  Administered 2022-06-18 – 2022-06-19 (×2): 15 mg via INTRAVENOUS
  Filled 2022-06-16 (×3): qty 1

## 2022-06-16 MED ORDER — SODIUM CHLORIDE 0.9 % IV BOLUS
500.0000 mL | Freq: Once | INTRAVENOUS | Status: AC
  Administered 2022-06-16: 500 mL via INTRAVENOUS

## 2022-06-16 MED ORDER — METOPROLOL TARTRATE 12.5 MG HALF TABLET
12.5000 mg | ORAL_TABLET | Freq: Two times a day (BID) | ORAL | Status: DC
  Administered 2022-06-17 – 2022-06-22 (×8): 12.5 mg
  Filled 2022-06-16 (×11): qty 1

## 2022-06-16 NOTE — Plan of Care (Signed)
  Problem: Education: Goal: Knowledge of General Education information will improve Description: Including pain rating scale, medication(s)/side effects and non-pharmacologic comfort measures Outcome: Progressing   Problem: Clinical Measurements: Goal: Respiratory complications will improve Outcome: Progressing   Problem: Activity: Goal: Risk for activity intolerance will decrease Outcome: Progressing   

## 2022-06-16 NOTE — Progress Notes (Signed)
Occupational Therapy Treatment Patient Details Name: Monica Hopkins MRN: [id number] DOB: May 21, 1971 Today's Date: 06/16/2022   History of present illness Pt is a 51 y.o. female admitted 05/19/22 with cardiac arrest at home, ROSC 26 min, hypothermic, rib fx from CPR. Intubated 10/17, failed extubation 10/23; trach placed 10/25. Pt with new HFpEF. Rapid response 11/4 due to recurrent desaturation with L mucous plugging and small effusion; transfer back to ICU 11/5-11/8. IR consulted for PEG 11/7. PMH includes bipolar disorder.   OT comments  Pt supine in bed, reports increased pain and not sleeping all night. Pt has been premedicated, but declines OOB at this time due to fatigue.  She did roll in bed with supervision for changing bed pad.  Issued 2 exercises using level 1 theraband in HEP, completed as below. Good understanding of exercises.  Will follow acutely.    Recommendations for follow up therapy are one component of a multi-disciplinary discharge planning process, led by the attending physician.  Recommendations may be updated based on patient status, additional functional criteria and insurance authorization.    Follow Up Recommendations  Acute inpatient rehab (3hours/day)     Assistance Recommended at Discharge Frequent or constant Supervision/Assistance  Patient can return home with the following  Two people to help with walking and/or transfers;A lot of help with bathing/dressing/bathroom;Assistance with cooking/housework;Direct supervision/assist for medications management;Direct supervision/assist for financial management;Assist for transportation;Help with stairs or ramp for entrance   Equipment Recommendations  Other (comment) (defer)    Recommendations for Other Services Rehab consult    Precautions / Restrictions Precautions Precautions: Fall;Other (comment) Precaution Comments: cortrak, trach collar, multiple wounds along spine and sacrum Restrictions Weight Bearing  Restrictions: No       Mobility Bed Mobility Overal bed mobility: Needs Assistance Bed Mobility: Rolling Rolling: Supervision         General bed mobility comments: rolling in bed to change pad with supervision    Transfers                   General transfer comment: declined OOB due to pain and fatigue     Balance                                           ADL either performed or assessed with clinical judgement   ADL                                              Extremity/Trunk Assessment              Vision       Perception     Praxis      Cognition Arousal/Alertness: Awake/alert Behavior During Therapy: Anxious, Restless Overall Cognitive Status: Impaired/Different from baseline Area of Impairment: Attention, Memory, Awareness, Problem solving, Following commands, Safety/judgement                   Current Attention Level: Selective Memory: Decreased short-term memory Following Commands: Follows one step commands consistently, Follows one step commands with increased time, Follows multi-step commands inconsistently Safety/Judgement: Decreased awareness of deficits, Decreased awareness of safety Awareness: Emergent Problem Solving: Slow processing, Difficulty sequencing, Requires verbal cues          Exercises Exercises: General Upper Extremity General  Exercises - Upper Extremity Shoulder Flexion: Strengthening, Both, 10 reps, Theraband, Supine Theraband Level (Shoulder Flexion): Level 1 (Yellow) Shoulder Horizontal ABduction: Strengthening, Both, 10 reps, Supine, Theraband Theraband Level (Shoulder Horizontal Abduction): Level 1 (Yellow)    Shoulder Instructions       General Comments      Pertinent Vitals/ Pain       Pain Assessment Pain Assessment: Faces Faces Pain Scale: Hurts even more Pain Location: generalized Pain Descriptors / Indicators: Grimacing, Discomfort Pain  Intervention(s): Limited activity within patient's tolerance, Monitored during session, Repositioned  Home Living                                          Prior Functioning/Environment              Frequency  Min 2X/week        Progress Toward Goals  OT Goals(current goals can now be found in the care plan section)  Progress towards OT goals: Progressing toward goals  Acute Rehab OT Goals OT Goal Formulation: With patient Time For Goal Achievement: 06/26/22 Potential to Achieve Goals: Good  Plan Discharge plan remains appropriate;Frequency remains appropriate    Co-evaluation                 AM-PAC OT "6 Clicks" Daily Activity     Outcome Measure   Help from another person eating meals?: A Little Help from another person taking care of personal grooming?: A Little Help from another person toileting, which includes using toliet, bedpan, or urinal?: A Lot Help from another person bathing (including washing, rinsing, drying)?: A Lot Help from another person to put on and taking off regular upper body clothing?: A Little Help from another person to put on and taking off regular lower body clothing?: Total 6 Click Score: 14    End of Session    OT Visit Diagnosis: Muscle weakness (generalized) (M62.81);Other symptoms and signs involving cognitive function;Pain   Activity Tolerance Patient tolerated treatment well   Patient Left in bed;with call bell/phone within reach;with bed alarm set   Nurse Communication Mobility status        Time: 9390-3009 OT Time Calculation (min): 21 min  Charges: OT General Charges $OT Visit: 1 Visit OT Treatments $Therapeutic Exercise: 8-22 mins  Barry Brunner, OT Acute Rehabilitation Services Office [phone]   Chancy Milroy 06/16/2022, 12:34 PM

## 2022-06-16 NOTE — Progress Notes (Addendum)
Requesting Dilaudid since 0430. BP 70-90/60s, O2 70-90. Explained multiple times to patient that vitals must be stable in order to administer opioids. Offered Tylenol, repositioning, heat and ice but patient states that she has been taking opioids for 40 years and it is the only thing that helps her. Dr. Arville Care on call and notified. New orders placed for 500 mL NS bolus and Ketorolac. Upon entering room to explain new orders and medication to patient, the patient began yelling stating that we are causing her to suffer and that she has been taking these meds at home without any problems for a long time. This Clinical research associate again attempted to explain how medications can cause harm due to unstable vital signs but patient continued yelling and cursing. States that when she was in the hospital in [place] there was never any issues with her getting the meds that she wanted. Began shouting profanity and insults about the nurses at [facility] not being worth sh@t  and then requested to leave AMA. Refusing all other AM medication until she can have Diluadid. Dr. Arville Care aware. Says that 0.5 mg IV diluadid can be given when SBP is >100. Attempt to contact patient son and daughter. Messages left for both.

## 2022-06-16 NOTE — Progress Notes (Signed)
Progress Note   Patient: Monica Hopkins IRS:[id number] DOB: 06/01/1971 DOA: 05/19/2022     28 DOS: the patient was seen and examined on 06/16/2022   Brief hospital course: 51 year old presented after cardiac arrest.  Found to be hypothermic and intubated in the emergency department. Significant Events 10/17: admitted post cardiac arrest; ROSC 26 minutes 10/20: off pressors, will awaken agitated, desaturates SBT.  10/23 failed extubation trial 10/25 trach 06/04/2022 she was transferred to the [facility] service 06/04/22 she desaturated on trach collar and PCCM was reconsulted. 06/05/22.  Continues to fail at swallowing and will need alternate means of feeding and she is resistant to PEG tube.  Consulted palliative care for further goals of care discussion and will also need a psychiatric evaluation 06/06/22. rapid response called given desaturation due to L mucous plugging and small effusion 06/07/22. Transferred to ICU after recurrent saturation.  She was refusing chest vest, metaneb.   06/08/22. Tolerating trach collar, accepting meds, c/o pain 06/09/22  IR consulted for PEG, unable to secondary to anatomy.  General surgery recommending conservative management, core track, diet as tolerated. 11/10 started on liquid diet.  Did poorly the first day, was noncompliant. 11/11 n.p.o. given poor compliance with proper diet, respiratory status decline, made NPO 11/12-13 tolerating liquid diet  Assessment and Plan: S/p cardiac arrest found to be in PEA/shockable rhythm, in the setting of hypoxia due to pneumococcal pneumonia    Acute hypoxic respiratory failure due to Strep pneumonia pneumonia, complicated with aspiration and healthcare associated pneumonia with Enterobacter, Klebsiella and stenotrophomonas  --S/p trach, Intermittent mucous plugging. On trach collar. Poor cough mechanics, deconditioned. Anxious and with rib pain  --continue nebs --10/23 BAL with klebsiella and enterobacter cloacae, 10/28  tracheal aspirate with enterobacter cloacae and stenotrophomonas, 11/4 tracheal aspirate with rare normal resp flora. Finish Bactrim --SLP working with Passy-Muir valve --Poor adherence to diet 11/11 resulting in worsening hypoxia.  Appears to be stabilized.  Chest x-ray no acute findings. --11/14 tolerating liquid diet   New diagnosis of acute HFpEF  --echo with EF 55%, no RWMA, grade 1 diastolic dysfunction, d shaped septum suggesting RV pressure/volume overload --stable   Severe protein calorie malnutrition --currently with Cortrak, and tolerating liquid diet --PEG considered, but would require open procedure. Discussed w/ CCS, aware of RD and ST thoughts about PEG, but given operative risk and improvement in swallow, now on a diet, CCS does not think benefit outweighs risk. No surgical placement recommended at this point per CCS.   Acute septic/toxic encephalopathy --resolved   Acute rhabdomyolysis, resolved Hyperkalemia/hypokalemia, resolved AKI due to septic ATN, rhabdomyolysis, resolved Hypernatremia --Creatinine stable   Anxiety depression bipolar  --psychiatry recommendations from 11/4 --Seroquel 50 mg TID prn for agitation only, clonazepam 0.5 mg in the AM, 2 mg qhs, 0.5 mg BID prn.  Continue depakote 250 mg BID (this was later increased 500 mg BID).  Trazodone 50 mg qhs prn.   Rib fx from CPR --Tylenol, Dilaudid prn, gabapentin   Acute blood loss anemia on anemia of chronic disease --Patient hemoglobin has stabilized now --iron panel suggestive of chronic disease   Thrombocytosis -trend   Hypoalbuminemia - follow   Decreased metoprolol today, can wean off     Subjective:   RN note overnight reviewed, patient wanted Dilaudid, was abusive towards staff, BP was soft Tolerating diet   Physical Exam: Vitals:   06/16/22 0320 06/16/22 0433 06/16/22 0504 06/16/22 0746  BP: _0 100/70  Pulse: 85 84 90   Resp: 18  _0 Temp: 98.9 F (37.2 C)    98.7 F (37.1 C)  TempSrc:    Oral  SpO2:  92% 97% 93%  Weight:      Height:       Physical Exam Vitals reviewed.  Constitutional:      General: She is not in acute distress.    Appearance: She is not ill-appearing or toxic-appearing.  Cardiovascular:     Rate and Rhythm: Normal rate and regular rhythm.     Comments: Telemetry SR Pulmonary:     Effort: No respiratory distress.     Breath sounds: No wheezing, rhonchi or rales.  Neurological:     Mental Status: She is alert.  Psychiatric:        Mood and Affect: Mood normal.        Behavior: Behavior normal.    Data Reviewed: CBG stable  Family Communication: none  Disposition: Status is: Inpatient Remains inpatient appropriate because: inadequate oral intake, poor operative candidate for open feeding tube placement.  Planned Discharge Destination:  CIR    Time spent: 20 minutes  Author: Murray Hodgkins, MD 06/16/2022 9:15 AM  For on call review [email or web address].

## 2022-06-17 DIAGNOSIS — I5032 Chronic diastolic (congestive) heart failure: Secondary | ICD-10-CM

## 2022-06-17 DIAGNOSIS — T17998A Other foreign object in respiratory tract, part unspecified causing other injury, initial encounter: Secondary | ICD-10-CM

## 2022-06-17 LAB — CBC WITH DIFFERENTIAL/PLATELET
Abs Immature Granulocytes: 0.02 10*3/uL (ref 0.00–0.07)
Basophils Absolute: 0.1 10*3/uL (ref 0.0–0.1)
Basophils Relative: 1 %
Eosinophils Absolute: 0.1 10*3/uL (ref 0.0–0.5)
Eosinophils Relative: 2 %
HCT: 24.7 % — ABNORMAL LOW (ref 36.0–46.0)
Hemoglobin: 8.2 g/dL — ABNORMAL LOW (ref 12.0–15.0)
Immature Granulocytes: 0 %
Lymphocytes Relative: 31 %
Lymphs Abs: 2 10*3/uL (ref 0.7–4.0)
MCH: 33.1 pg (ref 26.0–34.0)
MCHC: 33.2 g/dL (ref 30.0–36.0)
MCV: 99.6 fL (ref 80.0–100.0)
Monocytes Absolute: 0.4 10*3/uL (ref 0.1–1.0)
Monocytes Relative: 5 %
Neutro Abs: 4.1 10*3/uL (ref 1.7–7.7)
Neutrophils Relative %: 61 %
Platelets: 279 10*3/uL (ref 150–400)
RBC: 2.48 MIL/uL — ABNORMAL LOW (ref 3.87–5.11)
RDW: 17.4 % — ABNORMAL HIGH (ref 11.5–15.5)
WBC: 6.6 10*3/uL (ref 4.0–10.5)
nRBC: 0 % (ref 0.0–0.2)

## 2022-06-17 LAB — COMPREHENSIVE METABOLIC PANEL
ALT: 28 U/L (ref 0–44)
AST: 39 U/L (ref 15–41)
Albumin: 2.8 g/dL — ABNORMAL LOW (ref 3.5–5.0)
Alkaline Phosphatase: 87 U/L (ref 38–126)
Anion gap: 9 (ref 5–15)
BUN: 26 mg/dL — ABNORMAL HIGH (ref 6–20)
CO2: 25 mmol/L (ref 22–32)
Calcium: 9.8 mg/dL (ref 8.9–10.3)
Chloride: 102 mmol/L (ref 98–111)
Creatinine, Ser: 0.67 mg/dL (ref 0.44–1.00)
GFR, Estimated: >60 mL/min (ref >60)
Glucose, Bld: 98 mg/dL (ref 70–99)
Potassium: 4.8 mmol/L (ref 3.5–5.1)
Sodium: 136 mmol/L (ref 135–145)
Total Bilirubin: 0.1 mg/dL — ABNORMAL LOW (ref 0.3–1.2)
Total Protein: 6.9 g/dL (ref 6.5–8.1)

## 2022-06-17 LAB — GLUCOSE, CAPILLARY
Glucose-Capillary: 103 mg/dL — ABNORMAL HIGH (ref 70–99)
Glucose-Capillary: 103 mg/dL — ABNORMAL HIGH (ref 70–99)
Glucose-Capillary: 105 mg/dL — ABNORMAL HIGH (ref 70–99)
Glucose-Capillary: 113 mg/dL — ABNORMAL HIGH (ref 70–99)
Glucose-Capillary: 119 mg/dL — ABNORMAL HIGH (ref 70–99)
Glucose-Capillary: 93 mg/dL (ref 70–99)

## 2022-06-17 LAB — PHOSPHORUS: Phosphorus: 3.9 mg/dL (ref 2.5–4.6)

## 2022-06-17 LAB — MAGNESIUM: Magnesium: 1.7 mg/dL (ref 1.7–2.4)

## 2022-06-17 MED ORDER — SODIUM CHLORIDE 0.9 % IV BOLUS
500.0000 mL | Freq: Once | INTRAVENOUS | Status: AC
  Administered 2022-06-17: 500 mL via INTRAVENOUS

## 2022-06-17 MED ORDER — ENSURE ENLIVE PO LIQD
237.0000 mL | Freq: Three times a day (TID) | ORAL | Status: DC
  Administered 2022-06-18 – 2022-06-19 (×4): 237 mL via ORAL

## 2022-06-17 NOTE — Progress Notes (Signed)
Paged on-call physician as patient is requesting dilaudid and klonopin in setting of hypotension. Patient has requested a fluid bolus to raised her BP so she can received the requested PRNs. There is no standing order for fluid bolus. Awaiting physician response and monitoring patient.

## 2022-06-17 NOTE — Progress Notes (Signed)
Mobility Specialist Progress Note:   06/17/22 1253  Mobility  Activity Transferred from chair to bed  Level of Assistance Minimal assist, patient does 75% or more  Assistive Device  (HHA)  Distance Ambulated (ft) 4 ft  Activity Response Tolerated well  $Mobility charge 1 Mobility   Pt in chair asking to get back to bed. Complaints of bottom hurting. MinA +2 to stand. Left in bed with call bell in reach and all needs met.   Gareth Eagle Kenecia Barren Mobility Specialist Please contact via [employer] or  Rehab Office at [phone]

## 2022-06-17 NOTE — Progress Notes (Signed)
PROGRESS NOTE    Monica Hopkins  QIH:[id number] DOB: [birth year] DOA: 05/19/2022 PCP: Leonard Downing, MD   Brief Narrative:  51 year old presented after cardiac arrest.  Found to be hypothermic and intubated in the emergency department. Significant Events 10/17: admitted post cardiac arrest; ROSC 26 minutes 10/20: off pressors, will awaken agitated, desaturates SBT.  10/23 failed extubation trial 10/25 trach 06/04/2022 she was transferred to the [facility] service 06/04/22 she desaturated on trach collar and PCCM was reconsulted. 06/05/22.  Continues to fail at swallowing and will need alternate means of feeding and she is resistant to PEG tube.  Consulted palliative care for further goals of care discussion and will also need a psychiatric evaluation 06/06/22. rapid response called given desaturation due to L mucous plugging and small effusion 06/07/22. Transferred to ICU after recurrent saturation.  She was refusing chest vest, metaneb.   06/08/22. Tolerating trach collar, accepting meds, c/o pain 06/09/22  IR consulted for PEG, unable to secondary to anatomy.  General surgery recommending conservative management, core track, diet as tolerated. 11/10 started on liquid diet.  Did poorly the first day, was noncompliant. 11/11 n.p.o. given poor compliance with proper diet, respiratory status decline, made NPO 11/12-13 tolerating liquid diet  06/17/2022.  She is tolerating full liquids and will have SLP reevaluate.  We will do a calorie count and see if the patient can tolerate a soft diet  Assessment and Plan: No notes have been filed under this hospital service. Service: Hospitalist  S/p cardiac arrest found to be in PEA/shockable rhythm, in the setting of hypoxia due to pneumococcal pneumonia    Acute hypoxic respiratory failure due to Strep pneumonia pneumonia, complicated with aspiration and healthcare associated pneumonia with Enterobacter, Klebsiella and stenotrophomonas  --S/p trach,  Intermittent mucous plugging. On trach collar. Poor cough mechanics, deconditioned. Anxious and with rib pain  --continue nebs --10/23 BAL with klebsiella and enterobacter cloacae, 10/28 tracheal aspirate with enterobacter cloacae and stenotrophomonas, 11/4 tracheal aspirate with rare normal resp flora. C/w Bactrim course to completion -Continue with hypertonic saline nebs and breathing treatments with revefenacin 175 mcg daily --SLP working with Passy-Muir valve --Poor adherence to diet 11/11 resulting in worsening hypoxia.  Appears to be stabilized.  Chest x-ray no acute findings. --11/14 tolerating liquid diet and will have SLP reevaluate and will do a calorie Count    New diagnosis of acute HFpEF  --echo with EF 55%, no RWMA, grade 1 diastolic dysfunction, d shaped septum suggesting RV pressure/volume overload -- She is +15.622 L however this is unlikely accurate -Continuing metoprolol tartrate 12.5 mg p.o. twice daily and if needed we will give her a dose of Lasix   Severe protein calorie malnutrition -Currently with Cortrak, and tolerating liquid diet and will have SLP reevaluate -PEG considered, but would require open procedure. Discussed w/ CCS, aware of RD and ST thoughts about PEG, but given operative risk and improvement in swallow, now on a diet, CCS does not think benefit outweighs risk. No surgical placement recommended at this point per CCS. -Will need SLP reevaluation and calorie count   Acute septic/toxic encephalopathy --resolved   Acute Rhabdomyolysis, resolved Hyperkalemia/hypokalemia, resolved AKI due to septic ATN, rhabdomyolysis, resolved Hypernatremia -Creatinine stable and patient's BUNs/creatinine 26/0.67 -Avoid further nephrotoxic medications, contrast dyes, hypotension and dehydration -Repeat CMP in the a.m.   Anxiety depression bipolar  --psychiatry recommendations from 11/4 --Seroquel 50 mg TID prn for agitation only but this was scheduled and now  discontinued, clonazepam 0.5 mg in the AM,  2 mg qhs, 0.5 mg BID prn.  Continued depakote 250 mg BID (this was later increased 500 mg BID).  Trazodone 50 mg qhs prn.   Rib fx from CPR --Tylenol, Dilaudid prn, gabapentin   Acute blood loss anemia on anemia of chronic disease --Patient hemoglobin has stabilized now and is now 8.2/24.7 --iron panel suggestive of chronic disease -Continue monitor for signs symptoms of bleeding; no overt bleeding noted -Pete CBC and   Thrombocytosis -Improved and now resolved platelet count is now 279 -Continue monitor trend and repeat CBC in a.m.   Hypoalbuminemia -Patient's albumin level is now 2.8 -Continue To monitor trend and repeat CMP in a.m.  DVT prophylaxis: heparin injection 5,000 Units Start: 05/19/22 2200 SCDs Start: 05/19/22 2151    Code Status: Full Code Family Communication: No family present at bedside   Disposition Plan:  Level of care: Progressive Status is: Inpatient Remains inpatient appropriate because: Needs safe swallowing will repeat SLP and calorie count   Consultants:  [facility] Palliative PCCM Transfer  Procedures:  As delineated as above  Antimicrobials:  Anti-infectives (From admission, onward)    Start     Dose/Rate Route Frequency Ordered Stop   06/08/22 1500  fluconazole (DIFLUCAN) tablet 200 mg        200 mg Per Tube Daily 06/08/22 1402 06/14/22 1012   06/04/22 1045  sulfamethoxazole-trimethoprim (BACTRIM DS) 800-160 MG per tablet 2 tablet        2 tablet Oral Every 12 hours 06/04/22 0950 06/18/22 0959   06/01/22 1030  meropenem (MERREM) 2 g in sodium chloride 0.9 % 100 mL IVPB  Status:  Discontinued        2 g 280 mL/hr over 30 Minutes Intravenous Every 12 hours 06/01/22 0933 06/04/22 0950   05/27/22 1600  piperacillin-tazobactam (ZOSYN) IVPB 3.375 g  Status:  Discontinued        3.375 g 12.5 mL/hr over 240 Minutes Intravenous Every 8 hours 05/27/22 1018 06/01/22 0930   05/27/22 1130  vancomycin (VANCOREADY)  IVPB 1250 mg/250 mL  Status:  Discontinued        1,250 mg 166.7 mL/hr over 90 Minutes Intravenous Every 24 hours 05/27/22 1036 05/27/22 1037   05/27/22 1130  vancomycin (VANCOREADY) IVPB 1250 mg/250 mL  Status:  Discontinued        1,250 mg 166.7 mL/hr over 90 Minutes Intravenous Every 48 hours 05/27/22 1037 05/28/22 0832   05/27/22 1115  vancomycin (VANCOCIN) IVPB 1000 mg/200 mL premix  Status:  Discontinued        1,000 mg 200 mL/hr over 60 Minutes Intravenous  Once 05/27/22 1018 05/27/22 1036   05/27/22 1115  piperacillin-tazobactam (ZOSYN) IVPB 3.375 g        3.375 g 100 mL/hr over 30 Minutes Intravenous  Once 05/27/22 1018 05/27/22 1119   05/20/22 2200  vancomycin (VANCOREADY) IVPB 750 mg/150 mL  Status:  Discontinued        750 mg 150 mL/hr over 60 Minutes Intravenous Every 24 hours 05/19/22 2158 05/20/22 1017   05/20/22 2000  vancomycin (VANCOCIN) IVPB 1000 mg/200 mL premix  Status:  Discontinued        1,000 mg 200 mL/hr over 60 Minutes Intravenous Every 24 hours 05/19/22 2107 05/19/22 2158   05/20/22 1115  cefTRIAXone (ROCEPHIN) 2 g in sodium chloride 0.9 % 100 mL IVPB        2 g 200 mL/hr over 30 Minutes Intravenous Every 24 hours 05/20/22 1017 05/25/22 1042   05/20/22 1000  ceFEPIme (MAXIPIME) 2 g in sodium chloride 0.9 % 100 mL IVPB  Status:  Discontinued        2 g 200 mL/hr over 30 Minutes Intravenous Every 12 hours 05/19/22 2107 05/20/22 1017   05/19/22 2200  vancomycin (VANCOREADY) IVPB 1250 mg/250 mL        1,250 mg 166.7 mL/hr over 90 Minutes Intravenous  Once 05/19/22 2158 05/20/22 0116   05/19/22 2100  ceFEPIme (MAXIPIME) 2 g in sodium chloride 0.9 % 100 mL IVPB        2 g 200 mL/hr over 30 Minutes Intravenous  Once 05/19/22 2048 05/19/22 2208   05/19/22 2100  vancomycin (VANCOREADY) IVPB 1500 mg/300 mL  Status:  Discontinued        1,500 mg 150 mL/hr over 120 Minutes Intravenous  Once 05/19/22 2048 05/19/22 2158       Subjective: Seen and examined at bedside  and  Objective: Vitals:   06/17/22 0817 06/17/22 0934 06/17/22 1057 06/17/22 1500  BP:  119/73    Pulse: (!) 102 98 91 (!) 103  Resp: _0 Temp:      TempSrc:      SpO2: 96%  96% 91%  Weight:      Height:        Intake/Output Summary (Last 24 hours) at 06/17/2022 1721 Last data filed at 06/17/2022 8295 Gross per 24 hour  Intake 60 ml  Output 2 ml  Net 58 ml   Filed Weights   06/11/22 0546 06/14/22 0427 06/17/22 0455  Weight: 49.4 kg 49 kg 48.5 kg   Examination: Physical Exam:  Constitutional: Thin chronically ill-appearing Caucasian female in no acute distress appears calmer Neck: Has a tracheostomy in place with a Passy-Muir valve connected to the ATC Respiratory: Diminished to auscultation bilaterally with coarse breath sounds and some slight rhonchi, no wheezing, rales, rhonchi or crackles. Normal respiratory effort and patient is not tachypenic. No accessory muscle use.  Unlabored breathing Cardiovascular: RRR, no murmurs / rubs / gallops. S1 and S2 auscultated. No extremity edema. Abdomen: Soft, non-tender, non-distended. Bowel sounds positive.  GU: Deferred. Musculoskeletal: No clubbing / cyanosis of digits/nails. No joint deformity upper and lower extremities.  Skin: No rashes, lesions, ulcers on limited skin evaluation. No induration; Warm and dry.  Neurologic: CN 2-12 grossly intact with no focal deficits. Romberg sign and cerebellar reflexes not assessed.  Psychiatric: Normal judgment and insight. Alert and oriented x 3. Normal mood and appropriate affect.   Data Reviewed: I have personally reviewed following labs and imaging studies  CBC: Recent Labs  Lab 06/17/22 0903  WBC 6.6  NEUTROABS 4.1  HGB 8.2*  HCT 24.7*  MCV 99.6  PLT 621   Basic Metabolic Panel: Recent Labs  Lab 06/11/22 0614 06/17/22 0903  NA 139 136  K 4.7 4.8  CL 104 102  CO2 26 25  GLUCOSE 111* 98  BUN 55* 26*  CREATININE 0.96 0.67  CALCIUM 9.8 9.8  MG  --  1.7  PHOS   --  3.9   GFR: Estimated Creatinine Clearance: 63.7 mL/min (by C-G formula based on SCr of 0.67 mg/dL). Liver Function Tests: Recent Labs  Lab 06/17/22 0903  AST 39  ALT 28  ALKPHOS 87  BILITOT 0.1*  PROT 6.9  ALBUMIN 2.8*   No results for input(s): "LIPASE", "AMYLASE" in the last 168 hours. No results for input(s): "AMMONIA" in the last 168 hours. Coagulation Profile: No results for input(s): "INR", "PROTIME" in  the last 168 hours. Cardiac Enzymes: No results for input(s): "CKTOTAL", "CKMB", "CKMBINDEX", "TROPONINI" in the last 168 hours. BNP (last 3 results) No results for input(s): "PROBNP" in the last 8760 hours. HbA1C: No results for input(s): "HGBA1C" in the last 72 hours. CBG: Recent Labs  Lab 06/17/22 0004 06/17/22 0457 06/17/22 0801 06/17/22 1134 06/17/22 1614  GLUCAP 103* 105* 103* 93 113*   Lipid Profile: No results for input(s): "CHOL", "HDL", "LDLCALC", "TRIG", "CHOLHDL", "LDLDIRECT" in the last 72 hours. Thyroid Function Tests: No results for input(s): "TSH", "T4TOTAL", "FREET4", "T3FREE", "THYROIDAB" in the last 72 hours. Anemia Panel: No results for input(s): "VITAMINB12", "FOLATE", "FERRITIN", "TIBC", "IRON", "RETICCTPCT" in the last 72 hours. Sepsis Labs: No results for input(s): "PROCALCITON", "LATICACIDVEN" in the last 168 hours.  No results found for this or any previous visit (from the past 240 hour(s)).   Radiology Studies: No results found.  Scheduled Meds:  acetaminophen (TYLENOL) oral liquid 160 mg/5 mL  650 mg Per Tube QID   ascorbic acid  500 mg Per Tube BID   Chlorhexidine Gluconate Cloth  6 each Topical Daily   clonazePAM  0.5 mg Per Tube Daily   clonazePAM  2 mg Per Tube QHS   collagenase   Topical Daily   famotidine  20 mg Per Tube BID   feeding supplement  237 mL Oral TID BM   fluticasone  1 spray Each Nare Daily   folic acid  1 mg Per Tube Daily   gabapentin  300 mg Per Tube BID   heparin  5,000 Units Subcutaneous Q8H    hydrocortisone   Rectal BID   lidocaine  1 patch Transdermal Q24H   magic mouthwash  5 mL Oral TID   metoprolol tartrate  12.5 mg Per Tube BID   nutrition supplement (JUVEN)  1 packet Per Tube BID BM   mouth rinse  15 mL Mouth Rinse 4 times per day   revefenacin  175 mcg Nebulization Daily   senna-docusate  1 tablet Oral QHS   sodium chloride HYPERTONIC  4 mL Nebulization BID   sulfamethoxazole-trimethoprim  2 tablet Oral Q12H   thiamine  100 mg Per Tube Daily   valproic acid  500 mg Per Tube BID   vitamin A  10,000 Units Oral Daily   Continuous Infusions:  sodium chloride     sodium chloride Stopped (05/27/22 1640)   feeding supplement (VITAL AF 1.2 CAL) 1,000 mL (06/16/22 1207)    LOS: 29 days   Raiford Noble, DO [facility] Available via Epic secure chat 7am-7pm After these hours, please refer to coverage provider listed on [email or web address] 06/17/2022, 5:21 PM

## 2022-06-17 NOTE — Progress Notes (Signed)
Nutrition Follow-up  DOCUMENTATION CODES:   Severe malnutrition in context of social or environmental circumstances, Underweight  INTERVENTION:  Plan is for 48 hour calorie count.  Continue tube feeding via Cortrak: -Vital AF 1.2 at 60 mL/hour  -Provides: 1728 kcal, 108 grams of protein, 1166 mL H2O daily  With current free water flush of 200 mL every 4 hours, pt receives a total of 2366 mL H2O daily including water in tube feeding.   RD continues to recommend G-tube placement for nutrition support given profound severe malnutrition on admission, increased needs for wound healing, etc. Further recommendations to follow post G-tube placement.   Increase to Ensure Enlive po TID between meals, each supplement provides 350 kcal and 20 grams of protein. Patient prefers chocolate flavor.    Continue Juven BID per tube, each packet provides 80 calories, 8 grams of carbohydrate, 2.5  grams of protein (collagen), 7 grams of L-arginine and 7 grams of L-glutamine; supplement contains CaHMB, Vitamins C, E, B12 and Zinc to promote wound healing.   Continue Vitamin C 500 mg BID per tube, folic acid 1 mg daily per tube, thiamine 100 mg daily per tube.  Continue vitamin A 10000 units capsule daily by mouth.   Will discontinue Banatrol TF at this time as pt no longer having diarrhea and is reporting she has not had a BM in 4 days.   NUTRITION DIAGNOSIS:   Severe Malnutrition related to social / environmental circumstances (poor PO intake related to poor dentition after taking lithium) as evidenced by severe fat depletion, severe muscle depletion.  Ongoing.  GOAL:   Patient will meet greater than or equal to 90% of their needs  Met with TF regimen.  MONITOR:   Vent status, Labs, Weight trends, TF tolerance, Skin, I & O's  REASON FOR ASSESSMENT:   Ventilator    ASSESSMENT:   51 year old female with PMHx of bipolar disorder, poor dentition, unexplained wt loss admitted after cardiac  arrest. Found unconscious by husband who started CPR immediately. Intubated in ED on 05/19/22.  10/17 Admitted post arrest 10/18 Cortrak placed, TF initiated at 15 ml/hr 10/19 TF increased to 25 ml/hr 10/20 Off pressors 10/23 Failed extubation  10/25 Trach 11/05 Transferred back to ICU 11/08 Transferred back to 4E 11/10 s/p MBS and diet advanced to full liquids  Pt previously underwent CT abdomen indicating that pt's anatomy is not amenable to PEG placement and recommendations are for surgical consult for G-tube placement. MD has discussed with Surgery and there are concerns about risk for placement. RD recommends placement of G-tube given profound severe malnutrition on admission and increased nutrient needs for wound healing and recovery.   RD received consult for assessment of nutrition requirements/status and calorie count. MD has ordered 48 hour calorie count. Noted envelope had not yet been hung at time of RD follow-up. RD hung calorie count envelope on the door with instructions and also updated order to include calorie count instructions.  Met with patient at bedside. She reports she is tolerating full liquids at this time, but still has concerns with swallowing certain liquids. She has been able to tolerate drinking Ensure, Dr. Malachi Bonds, water, coffee, and hot chocolate. She does not like the taste of the soups. She reports she is drinking 1 Ensure per day and prefers chocolate flavor. She wants to start drinking more Ensure and wants to try to have 2-3 daily. Patient reports plan is for another MBS possibly today (don't see mention of this in the  chart at this time but did see repeat consult for SLP placed). She reports she is concerned she will not be able to tolerate more than liquids. She endorses nausea after taking pain medications and then working with therapy. Denies emesis or abdominal pain. She reports she has not had a BM in 4 days now. She reports she has thrush. Family brought in  caramel frappe from [employer] and she is excited to try to drink this today.  Current wt is 48.5 kg. This is -0.5 kg from 49 kg on 11/12, but will continue to monitor weight trends as pt is now taking in more by mouth. Suspect fluctuations in weight this admission from measuring weight on different scales.  UOP: 4 mL UOP (unsure if this is accurate)  I/O: +15,624 mL since 06/03/22 (unsure if accurate if all UOP not being tracked)  Enteral Access: 10 Fr. Cortrak tube placed 10/18; 76 cm at left nare with bridle in place; terminates in duodenal bulb per CT abd/pelvis 11/8   Tube Feed Regimen: Vital AF 1.2 at 60 mL/hour Free water: 200 mL every 4 hours  Medications reviewed and include: vitamin C 500 mg BID, famotidine, Ensure Enlive BID, Banatrol TF 60 mL TID, folic acid 1 mg daily, gabapentin, magic mouthwash, Juven BID, senna-docusate 1 tablet daily, Bactrim, thiamine 100 mg daily, vitamin A capsule 10000 units daily  Labs reviewed: CBG 93-105  Micronutrient Labs: CRP: 23.2 (H) Vitamin B12: 4289 (H) Folate B9: 6.0 (low normal)-continue supplementation Vitamin A: 3.7 (L) Vitamin C: <0.01 (L) Copper: 222 (wdl) Zinc: 46 (wdl)  Discussed with RN.  Diet Order:   Diet Order             Diet full liquid Room service appropriate? Yes with Assist; Fluid consistency: Thin  Diet effective now                  EDUCATION NEEDS:   Not appropriate for education at this time  Skin:  Skin Assessment: Skin Integrity Issues: Skin Integrity Issues:: DTI, Stage II, Stage III DTI: left lateral heel (2cm x 2cm); right lateral heel (1cm x 2cm) Stage II: sacrum (7cm x 10cm), vertebral column (3cm x 1cm), lower vertebral column (1cm x 1cm) Stage III: upper vertebral column (1cm x 1cm)  Last BM:  06/12/22 medium type 7  Height:   Ht Readings from Last 1 Encounters:  05/19/22 _0  (1.676 m)   Weight:   Wt Readings from Last 1 Encounters:  06/17/22 48.5 kg   Ideal Body Weight:  59.1  kg  BMI:  Body mass index is 17.26 kg/m.  Estimated Nutritional Needs:   Kcal:  1750-1950 kcals  Protein:  90-105 g  Fluid:  >/= 1.8 L  Kielee Care Magda Paganini, MS, RD, LDN, CNSC Pager number available on Amion

## 2022-06-17 NOTE — Progress Notes (Signed)
Physical Therapy Treatment Patient Details Name: Monica Hopkins MRN: [id number] DOB: [birth year] Today's Date: 06/17/2022   History of Present Illness Pt is a 51 y.o. female admitted 05/19/22 with cardiac arrest at home, ROSC 26 min, hypothermic, rib fx from CPR. Intubated 10/17, failed extubation 10/23; trach placed 10/25. Pt with new HFpEF. Rapid response 11/4 due to recurrent desaturation with L mucous plugging and small effusion; transfer back to ICU 11/5-11/8. IR consulted for PEG 11/7. PMH includes bipolar disorder.    PT Comments    Pt progressing well towards her physical therapy goals and is motivated to participate. Demonstrates improvements in activity tolerance and ambulation distance. Pt ambulating 45 ft with a walker and minA; required one seated rest break. Further distance limited due to dizziness and nausea. Pt remains an appropriate candidate for AIR to address deficits and maximize functional mobility.     Recommendations for follow up therapy are one component of a multi-disciplinary discharge planning process, led by the attending physician.  Recommendations may be updated based on patient status, additional functional criteria and insurance authorization.  Follow Up Recommendations  Acute inpatient rehab (3hours/day) Can patient physically be transported by private vehicle: Yes   Assistance Recommended at Discharge Frequent or constant Supervision/Assistance  Patient can return home with the following Direct supervision/assist for medications management;Help with stairs or ramp for entrance;Assist for transportation;Assistance with cooking/housework;Direct supervision/assist for financial management;Assistance with feeding;A lot of help with walking and/or transfers;A lot of help with bathing/dressing/bathroom   Equipment Recommendations  Wheelchair (measurements PT);Hospital bed;Wheelchair cushion (measurements PT)    Recommendations for Other Services        Precautions / Restrictions Precautions Precautions: Fall;Other (comment) Precaution Comments: cortrak, trach collar, multiple wounds along spine and sacrum Restrictions Weight Bearing Restrictions: No     Mobility  Bed Mobility Overal bed mobility: Needs Assistance Bed Mobility: Supine to Sit     Supine to sit: Min guard     General bed mobility comments: HOB elevated, no physical assist required, increased time/effort    Transfers Overall transfer level: Needs assistance Equipment used: Rolling walker (2 wheels) Transfers: Sit to/from Stand Sit to Stand: Min assist           General transfer comment: MinA to rise, PT stabilizing walker, pt utilizing narrow BOS    Ambulation/Gait Ambulation/Gait assistance: Min assist, +2 safety/equipment Gait Distance (Feet): 45 Feet Assistive device: Rolling walker (2 wheels) Gait Pattern/deviations: Step-through pattern, Decreased stride length, Trunk flexed, Narrow base of support, Knee flexed in stance - left Gait velocity: decreased Gait velocity interpretation: <1.8 ft/sec, indicate of risk for recurrent falls   General Gait Details: Pt utilizing narrow BOS, minA for balance, cues provided for activity pacing.   Stairs             Wheelchair Mobility    Modified Rankin (Stroke Patients Only)       Balance Overall balance assessment: Needs assistance Sitting-balance support: Feet supported Sitting balance-Leahy Scale: Fair     Standing balance support: Bilateral upper extremity supported, During functional activity Standing balance-Leahy Scale: Poor Standing balance comment: reliant on RW and minA                            Cognition Arousal/Alertness: Awake/alert Behavior During Therapy: WFL for tasks assessed/performed Overall Cognitive Status: Impaired/Different from baseline Area of Impairment: Attention, Memory, Awareness, Following commands, Safety/judgement  Current Attention Level: Selective Memory: Decreased short-term memory Following Commands: Follows multi-step commands inconsistently Safety/Judgement: Decreased awareness of deficits, Decreased awareness of safety Awareness: Emergent   General Comments: Very motivated to participate today        Exercises      General Comments  VSS on RA      Pertinent Vitals/Pain Pain Assessment Pain Assessment: Faces Faces Pain Scale: Hurts little more Pain Location: generalized Pain Descriptors / Indicators: Grimacing, Discomfort Pain Intervention(s): Limited activity within patient's tolerance, Monitored during session    Home Living                          Prior Function            PT Goals (current goals can now be found in the care plan section) Acute Rehab PT Goals Patient Stated Goal: to be able to walk out of here Potential to Achieve Goals: Good Progress towards PT goals: Progressing toward goals    Frequency    Min 3X/week      PT Plan Current plan remains appropriate    Co-evaluation              AM-PAC PT "6 Clicks" Mobility   Outcome Measure  Help needed turning from your back to your side while in a flat bed without using bedrails?: A Little Help needed moving from lying on your back to sitting on the side of a flat bed without using bedrails?: A Little Help needed moving to and from a bed to a chair (including a wheelchair)?: A Little Help needed standing up from a chair using your arms (e.g., wheelchair or bedside chair)?: A Little Help needed to walk in hospital room?: A Little Help needed climbing 3-5 steps with a railing? : A Lot 6 Click Score: 17    End of Session Equipment Utilized During Treatment: Gait belt Activity Tolerance: Patient tolerated treatment well Patient left: in chair;with call bell/phone within reach;with chair alarm set Nurse Communication: Mobility status PT Visit Diagnosis: Other abnormalities of gait and  mobility (R26.89);Muscle weakness (generalized) (M62.81);Unsteadiness on feet (R26.81);Other symptoms and signs involving the nervous system (R29.898);Difficulty in walking, not elsewhere classified (R26.2)     Time: 7121-9758 PT Time Calculation (min) (ACUTE ONLY): 22 min  Charges:  $Therapeutic Activity: 8-22 mins                     Lillia Pauls, PT, DPT Acute Rehabilitation Services Office [phone]    Monica Hopkins 06/17/2022, 1:09 PM

## 2022-06-18 LAB — CBC WITH DIFFERENTIAL/PLATELET
Abs Immature Granulocytes: 0.02 10*3/uL (ref 0.00–0.07)
Basophils Absolute: 0 10*3/uL (ref 0.0–0.1)
Basophils Relative: 1 %
Eosinophils Absolute: 0.1 10*3/uL (ref 0.0–0.5)
Eosinophils Relative: 2 %
HCT: 22.3 % — ABNORMAL LOW (ref 36.0–46.0)
Hemoglobin: 7.1 g/dL — ABNORMAL LOW (ref 12.0–15.0)
Immature Granulocytes: 0 %
Lymphocytes Relative: 30 %
Lymphs Abs: 1.8 10*3/uL (ref 0.7–4.0)
MCH: 32.4 pg (ref 26.0–34.0)
MCHC: 31.8 g/dL (ref 30.0–36.0)
MCV: 101.8 fL — ABNORMAL HIGH (ref 80.0–100.0)
Monocytes Absolute: 0.5 10*3/uL (ref 0.1–1.0)
Monocytes Relative: 8 %
Neutro Abs: 3.5 10*3/uL (ref 1.7–7.7)
Neutrophils Relative %: 59 %
Platelets: 236 10*3/uL (ref 150–400)
RBC: 2.19 MIL/uL — ABNORMAL LOW (ref 3.87–5.11)
RDW: 17.2 % — ABNORMAL HIGH (ref 11.5–15.5)
WBC: 6 10*3/uL (ref 4.0–10.5)
nRBC: 0 % (ref 0.0–0.2)

## 2022-06-18 LAB — GLUCOSE, CAPILLARY
Glucose-Capillary: 106 mg/dL — ABNORMAL HIGH (ref 70–99)
Glucose-Capillary: 106 mg/dL — ABNORMAL HIGH (ref 70–99)
Glucose-Capillary: 116 mg/dL — ABNORMAL HIGH (ref 70–99)
Glucose-Capillary: 87 mg/dL (ref 70–99)
Glucose-Capillary: 99 mg/dL (ref 70–99)

## 2022-06-18 LAB — COMPREHENSIVE METABOLIC PANEL
ALT: 21 U/L (ref 0–44)
AST: 25 U/L (ref 15–41)
Albumin: 2.5 g/dL — ABNORMAL LOW (ref 3.5–5.0)
Alkaline Phosphatase: 81 U/L (ref 38–126)
Anion gap: 10 (ref 5–15)
BUN: 38 mg/dL — ABNORMAL HIGH (ref 6–20)
CO2: 25 mmol/L (ref 22–32)
Calcium: 9.6 mg/dL (ref 8.9–10.3)
Chloride: 99 mmol/L (ref 98–111)
Creatinine, Ser: 0.76 mg/dL (ref 0.44–1.00)
GFR, Estimated: >60 mL/min (ref >60)
Glucose, Bld: 91 mg/dL (ref 70–99)
Potassium: 4.9 mmol/L (ref 3.5–5.1)
Sodium: 134 mmol/L — ABNORMAL LOW (ref 135–145)
Total Bilirubin: 0.3 mg/dL (ref 0.3–1.2)
Total Protein: 6 g/dL — ABNORMAL LOW (ref 6.5–8.1)

## 2022-06-18 LAB — MAGNESIUM: Magnesium: 1.6 mg/dL — ABNORMAL LOW (ref 1.7–2.4)

## 2022-06-18 LAB — PHOSPHORUS: Phosphorus: 3.9 mg/dL (ref 2.5–4.6)

## 2022-06-18 MED ORDER — SODIUM CHLORIDE 0.9 % IV BOLUS
250.0000 mL | Freq: Once | INTRAVENOUS | Status: AC
  Administered 2022-06-18: 250 mL via INTRAVENOUS

## 2022-06-18 MED ORDER — MAGNESIUM SULFATE 2 GM/50ML IV SOLN
2.0000 g | Freq: Once | INTRAVENOUS | Status: AC
  Administered 2022-06-18: 2 g via INTRAVENOUS
  Filled 2022-06-18: qty 50

## 2022-06-18 NOTE — Progress Notes (Signed)
On call MD notified of pt's BP 87/54. Order received for bolus. See MAR for administration. Patient AO x4. Plan of care continues.

## 2022-06-18 NOTE — Progress Notes (Signed)
PROGRESS NOTE    Monica Hopkins  ZOX:[id number] DOB: 01/25/1971 DOA: 05/19/2022 PCP: Leonard Downing, MD   Brief Narrative:  The patient is a 51 year old presented after cardiac arrest.  Found to be hypothermic and intubated in the emergency department. Significant Events 10/17: admitted post cardiac arrest; ROSC 26 minutes 10/20: off pressors, will awaken agitated, desaturates SBT.  10/23 failed extubation trial 10/25 trach 06/04/2022 she was transferred to the [facility] service 06/04/22 she desaturated on trach collar and PCCM was reconsulted. 06/05/22.  Continues to fail at swallowing and will need alternate means of feeding and she is resistant to PEG tube.  Consulted palliative care for further goals of care discussion and will also need a psychiatric evaluation 06/06/22. rapid response called given desaturation due to L mucous plugging and small effusion 06/07/22. Transferred to ICU after recurrent saturation.  She was refusing chest vest, metaneb.   06/08/22. Tolerating trach collar, accepting meds, c/o pain 06/09/22  IR consulted for PEG, unable to secondary to anatomy.  General surgery recommending conservative management, core track, diet as tolerated. 11/10 started on liquid diet.  Did poorly the first day, was noncompliant. 11/11 n.p.o. given poor compliance with proper diet, respiratory status decline, made NPO 11/12-13 tolerating liquid diet  06/17/2022.  She is tolerating full liquids and will have SLP reevaluate.  We will do a calorie count and see if the patient can tolerate a soft diet   Assessment and Plan:  S/p cardiac arrest found to be in PEA/shockable rhythm, in the setting of hypoxia due to pneumococcal pneumonia    Acute hypoxic respiratory failure due to Strep pneumonia pneumonia, complicated with aspiration and healthcare associated pneumonia with Enterobacter, Klebsiella and stenotrophomonas  --S/p trach, Intermittent mucous plugging. On trach collar. Poor cough  mechanics, deconditioned. Anxious and with rib pain  --continue nebs --10/23 BAL with klebsiella and enterobacter cloacae, 10/28 tracheal aspirate with enterobacter cloacae and stenotrophomonas, 11/4 tracheal aspirate with rare normal resp flora. C/w Bactrim course to completion -Continue with hypertonic saline nebs and breathing treatments with revefenacin 175 mcg daily -SpO2: 90 % O2 Flow Rate (L/min): 5 L/min FiO2 (%): 28 % --SLP working with Passy-Muir valve and having a repeat Evaluation for Diet advancement --Poor adherence to diet 11/11 resulting in worsening hypoxia.  Appears to be stabilized.  Chest x-ray no acute findings. --11/14 tolerating liquid diet and will have SLP reevaluate and will do a calorie Count    New diagnosis of acute HFpEF  --echo with EF 55%, no RWMA, grade 1 diastolic dysfunction, d shaped septum suggesting RV pressure/volume overload -- She is +15.622 L however this is unlikely accurate -Continuing metoprolol tartrate 12.5 mg p.o. twice daily and if needed we will give her a dose of Lasix but she is getting a few boluses  Hypotension -Asymptomatic -MAP was 58 this AM -Given a 250 mL bolus  -Order TED Hose -Continue to Monitor and Trend   Hypomagnesemia -Mild. Mag Level was 1.6 -Replete with IV Mag Sulfate 2 grams -Continue to Monitor and Trend -Repeat Mag Level in the AM    Severe protein calorie malnutrition -Currently with Cortrak, and tolerating liquid diet and will have SLP reevaluate -PEG considered, but would require open procedure. Discussed w/ CCS, aware of RD and ST thoughts about PEG, but given operative risk and improvement in swallow, now on a diet, CCS does not think benefit outweighs risk. No surgical placement recommended at this point per CCS. -Will need SLP reevaluation and calorie count and SLP  re-evaluation is pending    Acute septic/toxic encephalopathy --resolved   Acute Rhabdomyolysis, resolved Hyperkalemia/hypokalemia,  resolved AKI due to septic ATN, rhabdomyolysis, resolved Hypernatremia -Creatinine stable and patient's BUNs/creatinine 26/0.67 -Avoid further nephrotoxic medications, contrast dyes, hypotension and dehydration -Repeat CMP in the a.m.   Anxiety depression bipolar  --psychiatry recommendations from 11/4 --Seroquel 50 mg TID prn for agitation only but this was scheduled and now discontinued, clonazepam 0.5 mg in the AM, 2 mg qhs, 0.5 mg BID prn.  Continued depakote 250 mg BID (this was later increased 500 mg BID).  Trazodone 50 mg qhs prn.   Rib fx from CPR -C/w Tylenol, Dilaudid prn, gabapentin  Hyponatremia -Mild. Patient's Na+ went from 136 -> 134 -Continue to Monitor and Trend and getting IVF Boluses -Repeat CMP in the AM    Acute blood loss anemia on anemia of chronic disease/Macrocytic Anemia  -Patient hemoglobin has stabilized now and is now 8.2/24.7 yesterday but did drop and is now 7.1/22.3 -Iron panel suggestive of chronic disease -Continue monitor for signs symptoms of bleeding; no overt bleeding noted -Repeat CBC in the AM    Thrombocytosis -Improved and now resolved platelet count is now 279 yesterday and today is now 236 -Continue monitor trend and repeat CBC in a.m.   Hypoalbuminemia -Patient's albumin level is now 2.8 -> 2.5 -Continue To monitor trend and repeat CMP in a.m.  DVT prophylaxis: heparin injection 5,000 Units Start: 05/19/22 2200 SCDs Start: 05/19/22 2151    Code Status: Full Code Family Communication: Discussed with family present at bedside   Disposition Plan:  Level of care: Progressive Status is: Inpatient Remains inpatient appropriate because: Needs further clinical improvement able to tolerate a diet before going to CIR   Consultants:  [facility] P[facility]M Transfer Psychiatry   Procedures:  As delineated as above  Antimicrobials:  Anti-infectives (From admission, onward)    Start     Dose/Rate Route Frequency Ordered  Stop   06/08/22 1500  fluconazole (DIFLUCAN) tablet 200 mg        200 mg Per Tube Daily 06/08/22 1402 06/14/22 1012   06/04/22 1045  sulfamethoxazole-trimethoprim (BACTRIM DS) 800-160 MG per tablet 2 tablet        2 tablet Oral Every 12 hours 06/04/22 0950 06/17/22 2012   06/01/22 1030  meropenem (MERREM) 2 g in sodium chloride 0.9 % 100 mL IVPB  Status:  Discontinued        2 g 280 mL/hr over 30 Minutes Intravenous Every 12 hours 06/01/22 0933 06/04/22 0950   05/27/22 1600  piperacillin-tazobactam (ZOSYN) IVPB 3.375 g  Status:  Discontinued        3.375 g 12.5 mL/hr over 240 Minutes Intravenous Every 8 hours 05/27/22 1018 06/01/22 0930   05/27/22 1130  vancomycin (VANCOREADY) IVPB 1250 mg/250 mL  Status:  Discontinued        1,250 mg 166.7 mL/hr over 90 Minutes Intravenous Every 24 hours 05/27/22 1036 05/27/22 1037   05/27/22 1130  vancomycin (VANCOREADY) IVPB 1250 mg/250 mL  Status:  Discontinued        1,250 mg 166.7 mL/hr over 90 Minutes Intravenous Every 48 hours 05/27/22 1037 05/28/22 0832   05/27/22 1115  vancomycin (VANCOCIN) IVPB 1000 mg/200 mL premix  Status:  Discontinued        1,000 mg 200 mL/hr over 60 Minutes Intravenous  Once 05/27/22 1018 05/27/22 1036   05/27/22 1115  piperacillin-tazobactam (ZOSYN) IVPB 3.375 g  3.375 g 100 mL/hr over 30 Minutes Intravenous  Once 05/27/22 1018 05/27/22 1119   05/20/22 2200  vancomycin (VANCOREADY) IVPB 750 mg/150 mL  Status:  Discontinued        750 mg 150 mL/hr over 60 Minutes Intravenous Every 24 hours 05/19/22 2158 05/20/22 1017   05/20/22 2000  vancomycin (VANCOCIN) IVPB 1000 mg/200 mL premix  Status:  Discontinued        1,000 mg 200 mL/hr over 60 Minutes Intravenous Every 24 hours 05/19/22 2107 05/19/22 2158   05/20/22 1115  cefTRIAXone (ROCEPHIN) 2 g in sodium chloride 0.9 % 100 mL IVPB        2 g 200 mL/hr over 30 Minutes Intravenous Every 24 hours 05/20/22 1017 05/25/22 1042   05/20/22 1000  ceFEPIme (MAXIPIME) 2 g in  sodium chloride 0.9 % 100 mL IVPB  Status:  Discontinued        2 g 200 mL/hr over 30 Minutes Intravenous Every 12 hours 05/19/22 2107 05/20/22 1017   05/19/22 2200  vancomycin (VANCOREADY) IVPB 1250 mg/250 mL        1,250 mg 166.7 mL/hr over 90 Minutes Intravenous  Once 05/19/22 2158 05/20/22 0116   05/19/22 2100  ceFEPIme (MAXIPIME) 2 g in sodium chloride 0.9 % 100 mL IVPB        2 g 200 mL/hr over 30 Minutes Intravenous  Once 05/19/22 2048 05/19/22 2208   05/19/22 2100  vancomycin (VANCOREADY) IVPB 1500 mg/300 mL  Status:  Discontinued        1,500 mg 150 mL/hr over 120 Minutes Intravenous  Once 05/19/22 2048 05/19/22 2158       Subjective: Seen and examined at bedside and was laying in the bed and appeared calm and blood pressure the lower side but she had no symptoms of chest pain or shortness of breath.  Denies any lightheadedness or dizziness.  Feels okay and wanting to see if she can advance her diet and awaiting SLP evaluation.  No other concerns or plaints at this time.  Objective: Vitals:   06/18/22 0717 06/18/22 0750 06/18/22 0829 06/18/22 1110  BP:  (!) 95/59    Pulse: (!) 106 86 94 86  Resp: _0 Temp:  98.2 F (36.8 C)    TempSrc:  Oral    SpO2: 93% 92%  90%  Weight:      Height:        Intake/Output Summary (Last 24 hours) at 06/18/2022 1441 Last data filed at 06/18/2022 0000 Gross per 24 hour  Intake 120 ml  Output 80 ml  Net 40 ml   Filed Weights   06/14/22 0427 06/17/22 0455 06/18/22 0405  Weight: 49 kg 48.5 kg 48.2 kg   Examination: Physical Exam:  Constitutional: Thin chronically ill-appearing Caucasian female currently no acute distress appears Neck: In place with a Passy-Muir valve connected to ATC Respiratory: Diminished to auscultation bilaterally with coarse breath sounds and has some slight rhonchi, no wheezing, rales, rhonchi or crackles. Normal respiratory effort and patient is not tachypenic. No accessory muscle use.  Unlabored  breathing Cardiovascular: RRR, no murmurs / rubs / gallops. S1 and S2 auscultated. No extremity edema. Abdomen: Soft, non-tender, non-distended. Bowel sounds positive.  GU: Deferred. Musculoskeletal: No clubbing / cyanosis of digits/nails. No joint deformity upper and lower extremities.  Skin: No rashes, lesions, ulcers limited skin evaluation. No induration; Warm and dry.  Neurologic: CN 2-12 grossly intact with no focal deficits. Romberg sign and cerebellar reflexes not  assessed.  Psychiatric: Normal judgment and insight. Alert and oriented x 3. Normal mood and appropriate affect.   Data Reviewed: I have personally reviewed following labs and imaging studies  CBC: Recent Labs  Lab 06/17/22 0903 06/18/22 0603  WBC 6.6 6.0  NEUTROABS 4.1 3.5  HGB 8.2* 7.1*  HCT 24.7* 22.3*  MCV 99.6 101.8*  PLT 279 945   Basic Metabolic Panel: Recent Labs  Lab 06/17/22 0903 06/18/22 0603  NA 136 134*  K 4.8 4.9  CL 102 99  CO2 25 25  GLUCOSE 98 91  BUN 26* 38*  CREATININE 0.67 0.76  CALCIUM 9.8 9.6  MG 1.7 1.6*  PHOS 3.9 3.9   GFR: Estimated Creatinine Clearance: 63.3 mL/min (by C-G formula based on SCr of 0.76 mg/dL). Liver Function Tests: Recent Labs  Lab 06/17/22 0903 06/18/22 0603  AST 39 25  ALT 28 21  ALKPHOS 87 81  BILITOT 0.1* 0.3  PROT 6.9 6.0*  ALBUMIN 2.8* 2.5*   No results for input(s): "LIPASE", "AMYLASE" in the last 168 hours. No results for input(s): "AMMONIA" in the last 168 hours. Coagulation Profile: No results for input(s): "INR", "PROTIME" in the last 168 hours. Cardiac Enzymes: No results for input(s): "CKTOTAL", "CKMB", "CKMBINDEX", "TROPONINI" in the last 168 hours. BNP (last 3 results) No results for input(s): "PROBNP" in the last 8760 hours. HbA1C: No results for input(s): "HGBA1C" in the last 72 hours. CBG: Recent Labs  Lab 06/17/22 2041 06/18/22 0006 06/18/22 0434 06/18/22 0755 06/18/22 1133  GLUCAP 119* 87 106* 99 116*   Lipid  Profile: No results for input(s): "CHOL", "HDL", "LDLCALC", "TRIG", "CHOLHDL", "LDLDIRECT" in the last 72 hours. Thyroid Function Tests: No results for input(s): "TSH", "T4TOTAL", "FREET4", "T3FREE", "THYROIDAB" in the last 72 hours. Anemia Panel: No results for input(s): "VITAMINB12", "FOLATE", "FERRITIN", "TIBC", "IRON", "RETICCTPCT" in the last 72 hours. Sepsis Labs: No results for input(s): "PROCALCITON", "LATICACIDVEN" in the last 168 hours.  No results found for this or any previous visit (from the past 240 hour(s)).   Radiology Studies: No results found.  Scheduled Meds:  acetaminophen (TYLENOL) oral liquid 160 mg/5 mL  650 mg Per Tube QID   ascorbic acid  500 mg Per Tube BID   Chlorhexidine Gluconate Cloth  6 each Topical Daily   clonazePAM  0.5 mg Per Tube Daily   clonazePAM  2 mg Per Tube QHS   collagenase   Topical Daily   famotidine  20 mg Per Tube BID   feeding supplement  237 mL Oral TID BM   fluticasone  1 spray Each Nare Daily   folic acid  1 mg Per Tube Daily   gabapentin  300 mg Per Tube BID   heparin  5,000 Units Subcutaneous Q8H   hydrocortisone   Rectal BID   lidocaine  1 patch Transdermal Q24H   magic mouthwash  5 mL Oral TID   metoprolol tartrate  12.5 mg Per Tube BID   nutrition supplement (JUVEN)  1 packet Per Tube BID BM   mouth rinse  15 mL Mouth Rinse 4 times per day   revefenacin  175 mcg Nebulization Daily   senna-docusate  1 tablet Oral QHS   sodium chloride HYPERTONIC  4 mL Nebulization BID   thiamine  100 mg Per Tube Daily   valproic acid  500 mg Per Tube BID   vitamin A  10,000 Units Oral Daily   Continuous Infusions:  sodium chloride     sodium chloride Stopped (  05/27/22 1640)   feeding supplement (VITAL AF 1.2 CAL) 1,000 mL (06/18/22 1308)    LOS: 30 days   Raiford Noble, DO [facility] Available via Epic secure chat 7am-7pm After these hours, please refer to coverage provider listed on [email or web address] 06/18/2022, 2:41 PM

## 2022-06-18 NOTE — Progress Notes (Signed)
Calorie Count Note  48 hour calorie count ordered. Day 1 results:  Diet: full liquids Supplements: Ensure Enlive po TID  Dinner 11/15: 100% vanilla ice cream, 100% [employer] Caramel Frappe (620 kcal, 10 grams protein) Breakfast 11/16: 100% 2 apple juice, 100% hot chocolate, 100% fruit ice (310 kcal, 3 grams protein) Lunch 11/16: 100% two ice creams, 100% [employer] Caramel Frappe (750 kcal, 12 grams protein) Supplements: 2 bottles Ensure Enlive (700 kcal, 40 grams protein)  Total intake day 1: 2380 kcal (>100% of estimated needs)  65 grams protein (72% of minimum estimated needs)  Estimated Nutritional Needs:  Kcal:  1750-1950  Protein:  90-105 grams Fluid:  >/= 1.8 L  Nutrition Dx: Severe Malnutrition related to social / environmental circumstances (poor PO intake related to poor dentition after taking lithium) as evidenced by severe fat depletion, severe muscle depletion.   Goal: Patient will meet greater than or equal to 90% of their needs   Intervention:  -Continue 48 hour calorie count to assess average intake over both days prior to deciding on updated nutrition plan of care. -Continue tube feeding via Cortrak: -Vital AF 1.2 at 60 mL/hour  -Provides: 1728 kcal, 108 grams of protein, 1166 mL H2O daily  -With current free water flush of 200 mL every 4 hours, pt receives a total of 2366 mL H2O daily including water in tube feeding.  -RD continues to recommend G-tube placement for nutrition support given profound severe malnutrition on admission, increased needs for wound healing, etc. Further recommendations to follow post G-tube placement.  -Continue Ensure Enlive po TID between meals, each supplement provides 350 kcal and 20 grams of protein. Patient prefers chocolate flavor.   -Continue Juven BID per tube, each packet provides 80 calories, 8 grams of carbohydrate, 2.5  grams of protein (collagen), 7 grams of L-arginine and 7 grams of L-glutamine; supplement contains CaHMB,  Vitamins C, E, B12 and Zinc to promote wound healing.  -Continue Vitamin C 500 mg BID per tube, folic acid 1 mg daily per tube, thiamine 100 mg daily per tube.  -Continue vitamin A 33435 units capsule daily by mouth.  Letta Median, MS, RD, LDN, CNSC Pager number available on Amion

## 2022-06-18 NOTE — Progress Notes (Addendum)
Occupational Therapy Treatment Patient Details Name: Monica Hopkins MRN: [id number] DOB: 03-01-1971 Today's Date: 06/18/2022   History of present illness Pt is a 51 y.o. female admitted 05/19/22 with cardiac arrest at home, ROSC 26 min, hypothermic, rib fx from CPR. Intubated 10/17, failed extubation 10/23; trach placed 10/25. Pt with new HFpEF. Rapid response 11/4 due to recurrent desaturation with L mucous plugging and small effusion; transfer back to ICU 11/5-11/8. IR consulted for PEG 11/7. PMH includes bipolar disorder.   OT comments  Patient supine in bed and agreeable to OT. Hypotensive supine with BP 75/31 (50) and RN notified.  RN okay with pt attempting to sit up if tolerated.  Long sitting/lateral leans for toileting with setup assist for brief and wipes prior to movement- in long sitting BP 84/53 (64). Transitioned to EOB BP 86/62 (71) with lunch arriving.  Pt requesting to eat lunch, RN okay with pt remaining EOB to complete task with daughter present.  Pt asymptomatic throughout session. Will follow acutely.    Recommendations for follow up therapy are one component of a multi-disciplinary discharge planning process, led by the attending physician.  Recommendations may be updated based on patient status, additional functional criteria and insurance authorization.    Follow Up Recommendations  Acute inpatient rehab (3hours/day)     Assistance Recommended at Discharge Frequent or constant Supervision/Assistance  Patient can return home with the following  Two people to help with walking and/or transfers;A lot of help with bathing/dressing/bathroom;Assistance with cooking/housework;Direct supervision/assist for medications management;Direct supervision/assist for financial management;Assist for transportation;Help with stairs or ramp for entrance   Equipment Recommendations  Other (comment) (defer)    Recommendations for Other Services Rehab consult    Precautions / Restrictions  Precautions Precautions: Fall;Other (comment) Precaution Comments: cortrak, trach collar, multiple wounds along spine and sacrum Restrictions Weight Bearing Restrictions: No       Mobility Bed Mobility Overal bed mobility: Needs Assistance Bed Mobility: Supine to Sit     Supine to sit: Min guard     General bed mobility comments: min guard for safety    Transfers                         Balance Overall balance assessment: Needs assistance Sitting-balance support: Feet supported, No upper extremity supported Sitting balance-Leahy Scale: Good Sitting balance - Comments: EOB with supervision                                   ADL either performed or assessed with clinical judgement   ADL Overall ADL's : Needs assistance/impaired Eating/Feeding: Set up;Sitting Eating/Feeding Details (indicate cue type and reason): sitting EOB to engage in self feeding             Upper Body Dressing : Set up;Sitting           Toileting- Clothing Manipulation and Hygiene: Set up;Sitting/lateral lean Toileting - Clothing Manipulation Details (indicate cue type and reason): pt completing lateral leans long sitting in bed, completing hygiene using wipes     Functional mobility during ADLs: Min guard General ADL Comments: limited by hypotension today    Extremity/Trunk Assessment              Vision       Perception     Praxis      Cognition Arousal/Alertness: Awake/alert Behavior During Therapy: WFL for tasks assessed/performed Overall Cognitive Status:  Impaired/Different from baseline Area of Impairment: Attention, Memory, Awareness, Following commands, Safety/judgement, Problem solving                   Current Attention Level: Selective Memory: Decreased short-term memory Following Commands: Follows multi-step commands inconsistently Safety/Judgement: Decreased awareness of deficits, Decreased awareness of safety Awareness:  Emergent Problem Solving: Slow processing, Difficulty sequencing, Requires verbal cues          Exercises      Shoulder Instructions       General Comments hypotension today, improved from supine to EOB    Pertinent Vitals/ Pain       Pain Assessment Pain Assessment: Faces Faces Pain Scale: Hurts little more Pain Location: generalized Pain Descriptors / Indicators: Grimacing, Discomfort Pain Intervention(s): Limited activity within patient's tolerance, Monitored during session, Repositioned  Home Living                                          Prior Functioning/Environment              Frequency  Min 2X/week        Progress Toward Goals  OT Goals(current goals can now be found in the care plan section)  Progress towards OT goals: Progressing toward goals (slowly)  Acute Rehab OT Goals OT Goal Formulation: With patient Time For Goal Achievement: 06/26/22 Potential to Achieve Goals: Good  Plan Discharge plan remains appropriate;Frequency remains appropriate    Co-evaluation                 AM-PAC OT "6 Clicks" Daily Activity     Outcome Measure   Help from another person eating meals?: A Little Help from another person taking care of personal grooming?: A Little Help from another person toileting, which includes using toliet, bedpan, or urinal?: A Lot Help from another person bathing (including washing, rinsing, drying)?: A Lot Help from another person to put on and taking off regular upper body clothing?: A Little Help from another person to put on and taking off regular lower body clothing?: Total 6 Click Score: 14    End of Session    OT Visit Diagnosis: Muscle weakness (generalized) (M62.81);Other symptoms and signs involving cognitive function;Pain   Activity Tolerance Patient tolerated treatment well   Patient Left with call bell/phone within reach;Other (comment);with family/visitor present (seated EOB)   Nurse  Communication Mobility status (RN okay with sitting EOB eating with daughter present)        Time: 5361-4431 OT Time Calculation (min): 33 min  Charges: OT General Charges $OT Visit: 1 Visit OT Treatments $Self Care/Home Management : 23-37 mins  Barry Brunner, OT Acute Rehabilitation Services Office [phone]   Chancy Milroy 06/18/2022, 1:46 PM

## 2022-06-19 ENCOUNTER — Inpatient Hospital Stay (HOSPITAL_COMMUNITY): Payer: Medicaid Other

## 2022-06-19 DIAGNOSIS — D539 Nutritional anemia, unspecified: Secondary | ICD-10-CM

## 2022-06-19 LAB — GLUCOSE, CAPILLARY
Glucose-Capillary: 104 mg/dL — ABNORMAL HIGH (ref 70–99)
Glucose-Capillary: 108 mg/dL — ABNORMAL HIGH (ref 70–99)
Glucose-Capillary: 110 mg/dL — ABNORMAL HIGH (ref 70–99)
Glucose-Capillary: 111 mg/dL — ABNORMAL HIGH (ref 70–99)
Glucose-Capillary: 135 mg/dL — ABNORMAL HIGH (ref 70–99)
Glucose-Capillary: 90 mg/dL (ref 70–99)
Glucose-Capillary: 93 mg/dL (ref 70–99)

## 2022-06-19 LAB — CBC WITH DIFFERENTIAL/PLATELET
Abs Immature Granulocytes: 0.03 10*3/uL (ref 0.00–0.07)
Basophils Absolute: 0 10*3/uL (ref 0.0–0.1)
Basophils Relative: 0 %
Eosinophils Absolute: 0.2 10*3/uL (ref 0.0–0.5)
Eosinophils Relative: 2 %
HCT: 20.8 % — ABNORMAL LOW (ref 36.0–46.0)
Hemoglobin: 6.7 g/dL — CL (ref 12.0–15.0)
Immature Granulocytes: 0 %
Lymphocytes Relative: 30 %
Lymphs Abs: 2.2 10*3/uL (ref 0.7–4.0)
MCH: 32.8 pg (ref 26.0–34.0)
MCHC: 32.2 g/dL (ref 30.0–36.0)
MCV: 102 fL — ABNORMAL HIGH (ref 80.0–100.0)
Monocytes Absolute: 0.6 10*3/uL (ref 0.1–1.0)
Monocytes Relative: 9 %
Neutro Abs: 4.2 10*3/uL (ref 1.7–7.7)
Neutrophils Relative %: 59 %
Platelets: 223 10*3/uL (ref 150–400)
RBC: 2.04 MIL/uL — ABNORMAL LOW (ref 3.87–5.11)
RDW: 17.2 % — ABNORMAL HIGH (ref 11.5–15.5)
WBC: 7.2 10*3/uL (ref 4.0–10.5)
nRBC: 0 % (ref 0.0–0.2)

## 2022-06-19 LAB — COMPREHENSIVE METABOLIC PANEL
ALT: 17 U/L (ref 0–44)
AST: 20 U/L (ref 15–41)
Albumin: 2.3 g/dL — ABNORMAL LOW (ref 3.5–5.0)
Alkaline Phosphatase: 79 U/L (ref 38–126)
Anion gap: 8 (ref 5–15)
BUN: 50 mg/dL — ABNORMAL HIGH (ref 6–20)
CO2: 26 mmol/L (ref 22–32)
Calcium: 9.4 mg/dL (ref 8.9–10.3)
Chloride: 99 mmol/L (ref 98–111)
Creatinine, Ser: 0.8 mg/dL (ref 0.44–1.00)
GFR, Estimated: >60 mL/min (ref >60)
Glucose, Bld: 103 mg/dL — ABNORMAL HIGH (ref 70–99)
Potassium: 4.9 mmol/L (ref 3.5–5.1)
Sodium: 133 mmol/L — ABNORMAL LOW (ref 135–145)
Total Bilirubin: 0.4 mg/dL (ref 0.3–1.2)
Total Protein: 5.6 g/dL — ABNORMAL LOW (ref 6.5–8.1)

## 2022-06-19 LAB — PREPARE RBC (CROSSMATCH)

## 2022-06-19 LAB — HEMOGLOBIN AND HEMATOCRIT, BLOOD
HCT: 25.6 % — ABNORMAL LOW (ref 36.0–46.0)
Hemoglobin: 8.2 g/dL — ABNORMAL LOW (ref 12.0–15.0)

## 2022-06-19 LAB — PHOSPHORUS: Phosphorus: 3.7 mg/dL (ref 2.5–4.6)

## 2022-06-19 LAB — MAGNESIUM: Magnesium: 2.2 mg/dL (ref 1.7–2.4)

## 2022-06-19 MED ORDER — PROSOURCE PLUS PO LIQD
30.0000 mL | Freq: Two times a day (BID) | ORAL | Status: DC
  Administered 2022-06-20 – 2022-06-23 (×4): 30 mL via ORAL
  Filled 2022-06-19 (×5): qty 30

## 2022-06-19 MED ORDER — PANTOPRAZOLE SODIUM 40 MG PO TBEC
40.0000 mg | DELAYED_RELEASE_TABLET | Freq: Every day | ORAL | Status: DC
  Administered 2022-06-22 – 2022-06-25 (×4): 40 mg via ORAL
  Filled 2022-06-19 (×6): qty 1

## 2022-06-19 MED ORDER — SODIUM CHLORIDE 0.9% IV SOLUTION: Freq: Once | INTRAVENOUS | Status: AC

## 2022-06-19 MED ORDER — VITAL AF 1.2 CAL PO LIQD
1000.0000 mL | ORAL | Status: DC
  Administered 2022-06-19 – 2022-06-22 (×4): 1000 mL
  Filled 2022-06-19 (×5): qty 1000

## 2022-06-19 MED ORDER — ENSURE ENLIVE PO LIQD
237.0000 mL | Freq: Three times a day (TID) | ORAL | Status: DC
  Administered 2022-06-20 – 2022-06-23 (×11): 237 mL via ORAL

## 2022-06-19 NOTE — Progress Notes (Addendum)
Nutrition Follow-up  DOCUMENTATION CODES:   Severe malnutrition in context of social or environmental circumstances, Underweight  INTERVENTION:  Continue calorie count over the weekend to assess intake on dysphagia 1 diet with thin liquids.  Change to nocturnal tube feeding via Cortrak: -Vital AF 1.2 at 60 mL/hour x 12 hours overnight (1800-0600) -Provides: 864 kcal, 54 grams of protein, 583 mL H2O daily  Continue Ensure Enlive po TID, each supplement provides 350 kcal and 20 grams of protein.  Provide PROSource Plus po BID, each supplement provides 100 kcal and 15 grams of protein.  Continue Juven BID per tube, each packet provides 80 calories, 8 grams of carbohydrate, 2.5  grams of protein (collagen), 7 grams of L-arginine and 7 grams of L-glutamine; supplement contains CaHMB, Vitamins C, E, B12 and Zinc to promote wound healing.   Continue Vitamin C 500 mg BID per tube, folic acid 1 mg daily per tube, thiamine 100 mg daily per tube.   Continue vitamin A 10000 units capsule daily by mouth.  NUTRITION DIAGNOSIS:   Severe Malnutrition related to social / environmental circumstances (poor PO intake related to poor dentition after taking lithium) as evidenced by severe fat depletion, severe muscle depletion.  Ongoing.  GOAL:   Patient will meet greater than or equal to 90% of their needs  Met.  MONITOR:   Vent status, Labs, Weight trends, TF tolerance, Skin, I & O's  REASON FOR ASSESSMENT:   Ventilator    ASSESSMENT:   51 year old female with PMHx of bipolar disorder, poor dentition, unexplained wt loss admitted after cardiac arrest. Found unconscious by husband who started CPR immediately. Intubated in ED on 05/19/22.  10/17 Admitted post arrest 10/18 Cortrak placed, TF initiated at 15 ml/hr 10/19 TF increased to 25 ml/hr 10/20 Off pressors 10/23 Failed extubation  10/25 Trach 11/05 Transferred back to ICU 11/08 Transferred back to 4E 11/10 s/p MBS and diet  advanced to full liquids 11/17 s/p repeat MBS and diet advanced to dysphagia 1 with thin liquids  Met with patient at bedside. She has been tolerating full liquids well. She is excited about diet advancement to dysphagia 1 with thin liquids, but had not yet received a tray as lunch tray was not sent. Patient also missed breakfast tray as she was at [facility]. However, she still did really well with 48 hour calorie count (see previous RD notes from 11/16 and 11/17). She reports she continues to tolerate tube feeds. She has occasional nausea after pain medications and working with therapy. Denies emesis or abdominal pain. She did not have a caramel frappe from [employer] as her family is now sick and unable to bring them in. Patient amenable to trying PROSource Plus supplement to help meet protein needs as she would not typically drink 5 bottles of Ensure daily (only drank that many in the past 24 hours as she missed breakfast and lunch trays today).  Most recent weight is 48.2 kg. Suspect fluctuations in weight this admission from measuring weight on different scales. Will continue to monitor weight trends as pt now taking in more by mouth.  UOP: 3 occurrences unmeasured UOP  I/O: +18697 mL since admission (unsure of accuracy as exact UOP is not being measured)  Enteral Access: 10 Fr. Cortrak tube placed 10/18, 76 cm at left nare with bridle in place; terminates in duodenal bulb per CT abd/pelvis 11/8  Tube Feed Regimen: Vital AF 1.2 at 60 mL/hour  Medications reviewed and include: ascorbic acid 500 mg BID  per tube, famotidine, Ensure Enlive TID, folic acid 1 mg daily, gabapentin, magic mouthwash, Juven BID, pantoprazole, senna-docusate 1 tablet daily, thiamine 100 mg daily, vitamin A capsule 10000 units daily  Labs reviewed: CBG 93-135, Sodium 133, BUN 50  Micronutrient Labs: CRP: 23.2 (H) Vitamin B12: 4289 (H) Folate B9: 6.0 (low normal)-continue supplementation Vitamin A: 3.7 (L) Vitamin C: <0.01  (L) Copper: 222 (wdl) Zinc: 46 (wdl)  Discussed with MD via secure chat. Plan is to continue calorie count over the weekend to see how patient does with dysphagia 1 diet with thin liquids. Plan is to also change to nocturnal tube feeds.   Diet Order:   Diet Order             DIET - DYS 1 Room service appropriate? Yes with Assist; Fluid consistency: Thin  Diet effective now                  EDUCATION NEEDS:   Not appropriate for education at this time  Skin:  Skin Assessment: Skin Integrity Issues: Skin Integrity Issues:: DTI, Stage II, Stage III DTI: left lateral heel (2cm x 2cm); right lateral heel (1cm x 2cm) Stage II: sacrum (7cm x 10cm), vertebral column (3cm x 1cm), lower vertebral column (1cm x 1cm) Stage III: upper vertebral column (1cm x 1cm)  Last BM:  06/18/22: large type 6  Height:   Ht Readings from Last 1 Encounters:  05/19/22 5' 6" (1.676 m)   Weight:   Wt Readings from Last 1 Encounters:  06/18/22 48.2 kg   Ideal Body Weight:  59.1 kg  BMI:  Body mass index is 17.15 kg/m.  Estimated Nutritional Needs:   Kcal:  1750-1950 kcals  Protein:  90-105 g  Fluid:  >/= 1.8 L  Coree Brame Magda Paganini, MS, RD, LDN, CNSC Pager number available on Amion

## 2022-06-19 NOTE — Progress Notes (Signed)
Physical Therapy Treatment Patient Details Name: Monica Hopkins MRN: [id number] DOB: May 31, 1971 Today's Date: 06/19/2022   History of Present Illness Pt is a 51 y.o. female admitted 05/19/22 with cardiac arrest at home, ROSC 26 min, hypothermic, rib fx from CPR. Intubated 10/17, failed extubation 10/23; trach placed 10/25. Pt with new HFpEF. Rapid response 11/4 due to recurrent desaturation with L mucous plugging and small effusion; transfer back to ICU 11/5-11/8. IR consulted for PEG 11/7. PMH includes bipolar disorder.    PT Comments    Pt is very motivated to participate and improve, needing cues to acknowledge her endurance limitations to sit and rest to maintain her safety. She was able to progress to ambulating up to ~145 ft one bout, but demonstrated increased lower extremity and core muscular fatigue by the second bout. She required up to modA to prevent LOB due to her increasing instability as she fatigued. Pt's BP remained soft but stable throughout the session, see General Comments below. She was able to maintain sats in the 90s% on RA at rest with PMV on, but her sats appeared to decrease with gait on RA with PMV on (as low as 70s% with poor pleth). Will continue to follow acutely. Current recommendations remain appropriate.     Recommendations for follow up therapy are one component of a multi-disciplinary discharge planning process, led by the attending physician.  Recommendations may be updated based on patient status, additional functional criteria and insurance authorization.  Follow Up Recommendations  Acute inpatient rehab (3hours/day) Can patient physically be transported by private vehicle: Yes   Assistance Recommended at Discharge Frequent or constant Supervision/Assistance  Patient can return home with the following Direct supervision/assist for medications management;Help with stairs or ramp for entrance;Assist for transportation;Assistance with cooking/housework;Direct  supervision/assist for financial management;Assistance with feeding;A lot of help with walking and/or transfers;A lot of help with bathing/dressing/bathroom   Equipment Recommendations  Wheelchair (measurements PT);Hospital bed;Wheelchair cushion (measurements PT)    Recommendations for Other Services       Precautions / Restrictions Precautions Precautions: Fall;Other (comment) Precaution Comments: cortrak, trach collar, multiple wounds along spine and sacrum, watch BP and SpO2 Restrictions Weight Bearing Restrictions: No     Mobility  Bed Mobility Overal bed mobility: Needs Assistance Bed Mobility: Supine to Sit, Sit to Supine     Supine to sit: Min guard, HOB elevated Sit to supine: Min guard, HOB elevated   General bed mobility comments: Min guard assist for safety, HOB elevated and pt using bed rails. Extra time spent for pt to bridge in bed to change depend and perform pericare without assistance.    Transfers Overall transfer level: Needs assistance Equipment used: Rolling walker (2 wheels) Transfers: Sit to/from Stand Sit to Stand: Min assist           General transfer comment: Light minA for stability coming to stand from EOB with pt pushing up on RW.    Ambulation/Gait Ambulation/Gait assistance: Min assist, +2 safety/equipment, Mod assist Gait Distance (Feet): 145 Feet (x2 bouts of ~145 ft > ~80 ft) Assistive device: Rolling walker (2 wheels) Gait Pattern/deviations: Step-through pattern, Decreased stride length, Trunk flexed, Narrow base of support, Knee flexed in stance - left Gait velocity: decreased Gait velocity interpretation: <1.31 ft/sec, indicative of household ambulator   General Gait Details: Pt with slightly improved upright posture, but does have kyphotic posture. Pt reports hx of L lower extremity issues impacting her foot placement and stability, noted her L leg to fatigue quicker and demonstrate  increased flexion and poor control placing the  foot as distance progressed. MinA for the first bout for stability, difficulty turning head L <> R while ambulating. Progressed to needing modA as distance progressed the final gait bout due to pt fatigue and thus increased instability and flexed posture. Chair follow for safety throughout   Stairs             Wheelchair Mobility    Modified Rankin (Stroke Patients Only)       Balance Overall balance assessment: Needs assistance Sitting-balance support: Feet supported Sitting balance-Leahy Scale: Fair Sitting balance - Comments: EOB with supervision   Standing balance support: Bilateral upper extremity supported, During functional activity Standing balance-Leahy Scale: Poor Standing balance comment: reliant on RW and minA-modA depending on pt fatigue                            Cognition Arousal/Alertness: Awake/alert Behavior During Therapy: WFL for tasks assessed/performed Overall Cognitive Status: Impaired/Different from baseline Area of Impairment: Attention, Memory, Awareness, Following commands, Safety/judgement                   Current Attention Level: Selective Memory: Decreased short-term memory Following Commands: Follows multi-step commands inconsistently Safety/Judgement: Decreased awareness of deficits, Decreased awareness of safety Awareness: Emergent   General Comments: Very motivated to participate today. Difficulty concentrating on breathing rather than talking when ambulating, needing reminders to refocus.        Exercises      General Comments General comments (skin integrity, edema, etc.): hypotensive but stable, BP 91/56 supine, 92/67 sitting, 93/63 standing, 93/64 supine end of session; SpO2 reading in 70s% with bad pleth on RA while ambulating, rebounds to 90s% on RA with rest      Pertinent Vitals/Pain Pain Assessment Pain Assessment: Faces Faces Pain Scale: Hurts little more Pain Location: generalized Pain  Descriptors / Indicators: Grimacing, Discomfort Pain Intervention(s): Limited activity within patient's tolerance, Monitored during session, Repositioned    Home Living                          Prior Function            PT Goals (current goals can now be found in the care plan section) Acute Rehab PT Goals Patient Stated Goal: to be able to walk out of here PT Goal Formulation: With patient Time For Goal Achievement: 06/25/22 Potential to Achieve Goals: Good Progress towards PT goals: Progressing toward goals    Frequency    Min 3X/week      PT Plan Current plan remains appropriate    Co-evaluation              AM-PAC PT "6 Clicks" Mobility   Outcome Measure  Help needed turning from your back to your side while in a flat bed without using bedrails?: A Little Help needed moving from lying on your back to sitting on the side of a flat bed without using bedrails?: A Little Help needed moving to and from a bed to a chair (including a wheelchair)?: A Little Help needed standing up from a chair using your arms (e.g., wheelchair or bedside chair)?: A Little Help needed to walk in hospital room?: A Lot Help needed climbing 3-5 steps with a railing? : Total 6 Click Score: 15    End of Session Equipment Utilized During Treatment: Gait belt Activity Tolerance: Patient tolerated treatment well Patient  left: with call bell/phone within reach;in bed Nurse Communication: Mobility status;Other (comment) (sats) PT Visit Diagnosis: Other abnormalities of gait and mobility (R26.89);Muscle weakness (generalized) (M62.81);Unsteadiness on feet (R26.81);Other symptoms and signs involving the nervous system (R29.898);Difficulty in walking, not elsewhere classified (R26.2)     Time: 1255-1350 PT Time Calculation (min) (ACUTE ONLY): 55 min  Charges:  $Gait Training: 38-52 mins $Therapeutic Activity: 8-22 mins                     Raymond Gurney, PT, DPT Acute  Rehabilitation Services  Office: [phone]    Jewel Baize 06/19/2022, 4:31 PM

## 2022-06-19 NOTE — Progress Notes (Signed)
  X-cover Note: Called for HgB of 6.7 and BP of 86/56. Has been hypotensive mostly at night over last 48 hours. Will order 1 unit PRBC for transfusion.   Carollee Herter, DO [facility]

## 2022-06-19 NOTE — Progress Notes (Signed)
Calorie Count Note  48 hour calorie count ordered. Day 2 results:  Diet: full liquids Supplements: Ensure Enlive po TID  Dinner 11/16: 100% 2 apple juice, 50% peach gelatin, 100% tropical fruit ice, 100% orange sherbet (335 kcal, 2.5 grams of protein) Breakfast 11/17: N/A - pt missed as she was at [facility] but she drank one bottle of Ensure (see below) Lunch 11/17: N/A - missed lunch tray as diet was changed after MBS but she drank 2 bottles of Ensure (see below) Supplements: 5 bottles of Ensure Enlive - 2 yesterday late afternoon, 1 at breakfast this morning and 2 at lunch time (1750 kcal, 100 grams protein)  Total intake day 2: 2105 kcal (>100% of estimated needs) 102.5 grams protein (100% of estimated needs)  Estimated Nutritional Needs:  Kcal:  1750-1950  Protein:  90-105 grams Fluid:  >/= 1.8 L  Nutrition Dx: Severe Malnutrition related to social / environmental circumstances (poor PO intake related to poor dentition after taking lithium) as evidenced by severe fat depletion, severe muscle depletion.    Goal: Patient will meet greater than or equal to 90% of their needs    Intervention:  See full follow-up note to follow 06/19/22 for updated nutrition intervention.  Letta Median, MS, RD, LDN, CNSC Pager number available on Amion

## 2022-06-19 NOTE — Progress Notes (Signed)
Inpatient Rehab Admissions Coordinator:   Patient is hypotensive today. Not medically stable. Will continue to follow for potential CIR admission.   Rehab Admissons Coordinator [place], [place], [place] [phone]

## 2022-06-19 NOTE — Progress Notes (Signed)
PROGRESS NOTE    Monica Hopkins  QVZ:[id number] DOB: Jan 28, 1971 DOA: 05/19/2022 PCP: Leonard Downing, MD   Brief Narrative:  The patient is a 51 year old presented after cardiac arrest.  Found to be hypothermic and intubated in the emergency department. Significant Events 10/17: admitted post cardiac arrest; ROSC 26 minutes 10/20: off pressors, will awaken agitated, desaturates SBT.  10/23 failed extubation trial 10/25 trach 06/04/2022 she was transferred to the [facility] service 06/04/22 she desaturated on trach collar and PCCM was reconsulted. 06/05/22.  Continues to fail at swallowing and will need alternate means of feeding and she is resistant to PEG tube.  Consulted palliative care for further goals of care discussion and will also need a psychiatric evaluation 06/06/22. rapid response called given desaturation due to L mucous plugging and small effusion 06/07/22. Transferred to ICU after recurrent saturation.  She was refusing chest vest, metaneb.   06/08/22. Tolerating trach collar, accepting meds, c/o pain 06/09/22  IR consulted for PEG, unable to secondary to anatomy.  General surgery recommending conservative management, core track, diet as tolerated. 11/10 started on liquid diet.  Did poorly the first day, was noncompliant. 11/11 n.p.o. given poor compliance with proper diet, respiratory status decline, made NPO 11/12-13 tolerating liquid diet  06/17/2022.  She is tolerating full liquids and will have SLP reevaluate.  We will do a calorie count and see if the patient can tolerate a soft diet   Assessment and Plan:  S/p cardiac arrest found to be in PEA/shockable rhythm, in the setting of hypoxia due to pneumococcal pneumonia    Acute hypoxic respiratory failure due to Strep pneumonia pneumonia, complicated with aspiration and healthcare associated pneumonia with Enterobacter, Klebsiella and stenotrophomonas  --S/p trach, Intermittent mucous plugging. On trach collar. Poor cough  mechanics, deconditioned. Anxious and with rib pain  --continue nebs --10/23 BAL with klebsiella and enterobacter cloacae, 10/28 tracheal aspirate with enterobacter cloacae and stenotrophomonas, 11/4 tracheal aspirate with rare normal resp flora. C/w Bactrim course to completion -Continue with hypertonic saline nebs and breathing treatments with revefenacin 175 mcg daily -SpO2: 98 % O2 Flow Rate (L/min): 5 L/min FiO2 (%): 28 % --SLP working with Passy-Muir valve and having a repeat Evaluation for Diet advancement --Poor adherence to diet 11/11 resulting in worsening hypoxia.  Appears to be stabilized.  Chest x-ray no acute findings. --11/14 tolerating liquid diet and will have SLP reevaluate and will do a calorie Count    New diagnosis of acute HFpEF  --echo with EF 55%, no RWMA, grade 1 diastolic dysfunction, d shaped septum suggesting RV pressure/volume overload -- She is + 19.291 L however this is unlikely accurate -Continuing metoprolol tartrate 12.5 mg p.o. twice daily and if needed we will give her a dose of Lasix but she is getting a few boluses   Hypotension -Asymptomatic -MAP was 58 yesterday AM -Given a 250 mL bolus yesterday and blood pressure remains low and blood count is dropping so she stopped it.  Transfuse 1 unit of blood -Order TED Hose -Continue to Monitor and Trend    Hypomagnesemia -Mild. Mag Level was 1.6 and improved to 2.2 -Replete with IV Mag Sulfate 2 grams -Continue to Monitor and Trend -Repeat Mag Level in the AM    Severe protein calorie malnutrition -Currently with Cortrak, and tolerating liquid diet and will have SLP reevaluate -PEG considered, but would require open procedure. Discussed w/ CCS, aware of RD and ST thoughts about PEG, but given operative risk and improvement in swallow, now on  a diet, CCS does not think benefit outweighs risk. No surgical placement recommended at this point per CCS. -Will need SLP reevaluation and calorie count and SLP  re-evaluation And diet has been advanced to dysphagia 1 diet -We will continue tube feedings but changed to nocturnal and continue calorie count follow-up on Monday   Acute septic/toxic encephalopathy -Resolved   Acute Rhabdomyolysis, resolved Hyperkalemia/hypokalemia, resolved AKI due to septic ATN, rhabdomyolysis, resolved Hypernatremia -> Hyponatremia  -Creatinine had been stable and patient's BUNs/creatinine has gone from 26/0.67  -> 38/0.76 -> 50/0.80 -Potassium is now 4.9, magnesium is 2.2, and phosphorus level is now 3.7 -Avoid further nephrotoxic medications, contrast dyes, hypotension and dehydration -Repeat CMP in the a.m.   Anxiety depression bipolar  --psychiatry recommendations from 11/4 --Seroquel 50 mg TID prn for agitation only but this was scheduled and now discontinued, clonazepam 0.5 mg in the AM, 2 mg qhs, 0.5 mg BID prn.  Continued depakote 250 mg BID (this was later increased 500 mg BID).  Trazodone 50 mg qhs prn.   Rib fx from CPR -C/w Tylenol, Dilaudid prn, gabapentin   Hyponatremia -Mild. Patient's Na+ went from 136 -> 134 and today is 133 -Continue to Monitor and Trend and getting IVF Boluses but will hold today given that she is getting blood -Repeat CMP in the AM    Acute blood loss anemia on anemia of chronic disease/Macrocytic Anemia  -Patient hemoglobin had stabilized now dropping again and went from 8.2/24.7 -> 7.1/22.3 -> 6.7/20.8 with an MCV of 100.2.  She was typed and screened and transfused 1 unit PRBCs and repeat was 8.2/25.6 -We will check FOBT and question if this is related to an upper GI bleeding given that her BUN is dissociated from her creatinine -Patient does have a history of a duodenal ulcer and may need further GI evaluation -Iron panel suggestive of chronic disease -Continue monitor for signs symptoms of bleeding; no overt bleeding noted -We have stopped her Toradol given her worsening BUN and possible upper GI bleeding but patient  denies any dark stools as of now -Repeat CBC in the AM    Thrombocytosis -Improved and now resolved platelet count is now 279 yesterday and today is now 236 yesterday and today is 223 -Continue monitor trend and repeat CBC in a.m.   Hypoalbuminemia -Patient's albumin level is now 2.8 -> 2.5 and dropped again to 2.3 -Continue To monitor trend and repeat CMP in a.m.  DVT prophylaxis: heparin injection 5,000 Units Start: 05/19/22 2200 SCDs Start: 05/19/22 2151    Code Status: Full Code Family Communication: No family currently at bedside  Disposition Plan:  Level of care: Progressive Status is: Inpatient Remains inpatient appropriate because: His further clinical improvement and tolerance of diet and will need to investigate why her blood count dropped.  PT OT recommending CIR and when she is medically stable can be discharged there   Consultants:  [facility] Transfer Psychiatry   Procedures:  As delineated as above  Antimicrobials:  Anti-infectives (From admission, onward)    Start     Dose/Rate Route Frequency Ordered Stop   06/08/22 1500  fluconazole (DIFLUCAN) tablet 200 mg        200 mg Per Tube Daily 06/08/22 1402 06/14/22 1012   06/04/22 1045  sulfamethoxazole-trimethoprim (BACTRIM DS) 800-160 MG per tablet 2 tablet        2 tablet Oral Every 12 hours 06/04/22 0950 06/17/22 2012   06/01/22 1030  meropenem (MERREM) 2 g  in sodium chloride 0.9 % 100 mL IVPB  Status:  Discontinued        2 g 280 mL/hr over 30 Minutes Intravenous Every 12 hours 06/01/22 0933 06/04/22 0950   05/27/22 1600  piperacillin-tazobactam (ZOSYN) IVPB 3.375 g  Status:  Discontinued        3.375 g 12.5 mL/hr over 240 Minutes Intravenous Every 8 hours 05/27/22 1018 06/01/22 0930   05/27/22 1130  vancomycin (VANCOREADY) IVPB 1250 mg/250 mL  Status:  Discontinued        1,250 mg 166.7 mL/hr over 90 Minutes Intravenous Every 24 hours 05/27/22 1036 05/27/22 1037   05/27/22 1130   vancomycin (VANCOREADY) IVPB 1250 mg/250 mL  Status:  Discontinued        1,250 mg 166.7 mL/hr over 90 Minutes Intravenous Every 48 hours 05/27/22 1037 05/28/22 0832   05/27/22 1115  vancomycin (VANCOCIN) IVPB 1000 mg/200 mL premix  Status:  Discontinued        1,000 mg 200 mL/hr over 60 Minutes Intravenous  Once 05/27/22 1018 05/27/22 1036   05/27/22 1115  piperacillin-tazobactam (ZOSYN) IVPB 3.375 g        3.375 g 100 mL/hr over 30 Minutes Intravenous  Once 05/27/22 1018 05/27/22 1119   05/20/22 2200  vancomycin (VANCOREADY) IVPB 750 mg/150 mL  Status:  Discontinued        750 mg 150 mL/hr over 60 Minutes Intravenous Every 24 hours 05/19/22 2158 05/20/22 1017   05/20/22 2000  vancomycin (VANCOCIN) IVPB 1000 mg/200 mL premix  Status:  Discontinued        1,000 mg 200 mL/hr over 60 Minutes Intravenous Every 24 hours 05/19/22 2107 05/19/22 2158   05/20/22 1115  cefTRIAXone (ROCEPHIN) 2 g in sodium chloride 0.9 % 100 mL IVPB        2 g 200 mL/hr over 30 Minutes Intravenous Every 24 hours 05/20/22 1017 05/25/22 1042   05/20/22 1000  ceFEPIme (MAXIPIME) 2 g in sodium chloride 0.9 % 100 mL IVPB  Status:  Discontinued        2 g 200 mL/hr over 30 Minutes Intravenous Every 12 hours 05/19/22 2107 05/20/22 1017   05/19/22 2200  vancomycin (VANCOREADY) IVPB 1250 mg/250 mL        1,250 mg 166.7 mL/hr over 90 Minutes Intravenous  Once 05/19/22 2158 05/20/22 0116   05/19/22 2100  ceFEPIme (MAXIPIME) 2 g in sodium chloride 0.9 % 100 mL IVPB        2 g 200 mL/hr over 30 Minutes Intravenous  Once 05/19/22 2048 05/19/22 2208   05/19/22 2100  vancomycin (VANCOREADY) IVPB 1500 mg/300 mL  Status:  Discontinued        1,500 mg 150 mL/hr over 120 Minutes Intravenous  Once 05/19/22 2048 05/19/22 2158       Subjective: Seen and examined at bedside and had just come back from her MBS and diet has been advanced to dysphagia 1 diet.  She denies any chest pain or shortness of breath.  Concerned that her blood  count dropped and she states that she has a history of duodenal ulcer.  No nausea or vomiting.  Denies any lightheadedness or dizziness.  Feels okay.  Objective: Vitals:   06/19/22 1230 06/19/22 1646 06/19/22 1700 06/19/22 1741  BP: 94/68  (!) 94/56   Pulse: 90 (P) 100 100 99  Resp: 20 (P) _0 Temp: 97.8 F (36.6 C) (P) 98 F (36.7 C)    TempSrc: Oral (P) Oral  SpO2:  (P) 94% 96% 98%  Weight:      Height:        Intake/Output Summary (Last 24 hours) at 06/19/2022 1747 Last data filed at 06/19/2022 0854 Gross per 24 hour  Intake 4724.83 ml  Output --  Net 4724.83 ml   Filed Weights   06/14/22 0427 06/17/22 0455 06/18/22 0405  Weight: 49 kg 48.5 kg 48.2 kg   Examination: Physical Exam:  Constitutional: Thin chronically ill-appearing Caucasian female currently no acute distress Neck: Has a tracheostomy in place with a Passy-Muir valve connected to ATC and wearing 5 L Respiratory: Diminished to auscultation bilaterally with coarse breath sounds and has some rhonchi, no wheezing, rales, rhonchi or crackles. Normal respiratory effort and patient is not tachypenic. No accessory muscle use.  Unlabored breathing Cardiovascular: RRR, no murmurs / rubs / gallops. S1 and S2 auscultated. No extremity edema.  Abdomen: Soft, non-tender, non-distended. Bowel sounds positive.  GU: Deferred. Musculoskeletal: No clubbing / cyanosis of digits/nails. No joint deformity upper and lower extremities.  Skin: No rashes, lesions, ulcers on limited skin evaluation. No induration; Warm and dry.  Neurologic: CN 2-12 grossly intact with no focal deficits. Romberg sign and cerebellar reflexes not assessed.  Psychiatric: Normal judgment and insight. Alert and oriented x 3.  A little anxious mood and appropriate affect.   Data Reviewed: I have personally reviewed following labs and imaging studies  CBC: Recent Labs  Lab 06/17/22 0903 06/18/22 0603 06/19/22 0138 06/19/22 1152  WBC 6.6 6.0 7.2   --   NEUTROABS 4.1 3.5 4.2  --   HGB 8.2* 7.1* 6.7* 8.2*  HCT 24.7* 22.3* 20.8* 25.6*  MCV 99.6 101.8* 102.0*  --   PLT 279 236 223  --    Basic Metabolic Panel: Recent Labs  Lab 06/17/22 0903 06/18/22 0603 06/19/22 0138  NA 136 134* 133*  K 4.8 4.9 4.9  CL 102 99 99  CO2 _0 GLUCOSE 98 91 103*  BUN 26* 38* 50*  CREATININE 0.67 0.76 0.80  CALCIUM 9.8 9.6 9.4  MG 1.7 1.6* 2.2  PHOS 3.9 3.9 3.7   GFR: Estimated Creatinine Clearance: 63.3 mL/min (by C-G formula based on SCr of 0.8 mg/dL). Liver Function Tests: Recent Labs  Lab 06/17/22 0903 06/18/22 0603 06/19/22 0138  AST 39 25 20  ALT _1 ALKPHOS 87 81 79  BILITOT 0.1* 0.3 0.4  PROT 6.9 6.0* 5.6*  ALBUMIN 2.8* 2.5* 2.3*   No results for input(s): "LIPASE", "AMYLASE" in the last 168 hours. No results for input(s): "AMMONIA" in the last 168 hours. Coagulation Profile: No results for input(s): "INR", "PROTIME" in the last 168 hours. Cardiac Enzymes: No results for input(s): "CKTOTAL", "CKMB", "CKMBINDEX", "TROPONINI" in the last 168 hours. BNP (last 3 results) No results for input(s): "PROBNP" in the last 8760 hours. HbA1C: No results for input(s): "HGBA1C" in the last 72 hours. CBG: Recent Labs  Lab 06/19/22 0019 06/19/22 0356 06/19/22 0806 06/19/22 1228 06/19/22 1654  GLUCAP 135* 104* 110* 93 111*   Lipid Profile: No results for input(s): "CHOL", "HDL", "LDLCALC", "TRIG", "CHOLHDL", "LDLDIRECT" in the last 72 hours. Thyroid Function Tests: No results for input(s): "TSH", "T4TOTAL", "FREET4", "T3FREE", "THYROIDAB" in the last 72 hours. Anemia Panel: No results for input(s): "VITAMINB12", "FOLATE", "FERRITIN", "TIBC", "IRON", "RETICCTPCT" in the last 72 hours. Sepsis Labs: No results for input(s): "PROCALCITON", "LATICACIDVEN" in the last 168 hours.  No results found for this or any previous visit (from  the past 240 hour(s)).   Radiology Studies: DG CHEST PORT 1 VIEW  Result Date:  06/19/2022 CLINICAL DATA:  Shortness of breath.  History of pneumonia. EXAM: PORTABLE CHEST 1 VIEW COMPARISON:  Radiographs 06/12/2022 and 06/09/2022.  CT 05/19/2022. FINDINGS: 0618 hours. The tracheostomy and feeding tube appear unchanged. There has been further improvement in the left-greater-than-right basilar airspace opacities no significant pleural effusion or pneumothorax demonstrated. The heart size and mediastinal contours are stable. The bones appear unchanged. IMPRESSION: Further improvement in left-greater-than-right basilar airspace opacities. No new findings. Electronically Signed   By: Richardean Sale M.D.   On: 06/19/2022 08:13     Scheduled Meds:  [START ON 06/20/2022] (feeding supplement) PROSource Plus  30 mL Oral BID BM   acetaminophen (TYLENOL) oral liquid 160 mg/5 mL  650 mg Per Tube QID   ascorbic acid  500 mg Per Tube BID   Chlorhexidine Gluconate Cloth  6 each Topical Daily   clonazePAM  0.5 mg Per Tube Daily   clonazePAM  2 mg Per Tube QHS   collagenase   Topical Daily   famotidine  20 mg Per Tube BID   [START ON 06/20/2022] feeding supplement  237 mL Oral TID WC   feeding supplement (VITAL AF 1.2 CAL)  1,000 mL Per Tube Q24H   fluticasone  1 spray Each Nare Daily   folic acid  1 mg Per Tube Daily   gabapentin  300 mg Per Tube BID   heparin  5,000 Units Subcutaneous Q8H   hydrocortisone   Rectal BID   lidocaine  1 patch Transdermal Q24H   magic mouthwash  5 mL Oral TID   metoprolol tartrate  12.5 mg Per Tube BID   nutrition supplement (JUVEN)  1 packet Per Tube BID BM   mouth rinse  15 mL Mouth Rinse 4 times per day   pantoprazole  40 mg Oral Daily   revefenacin  175 mcg Nebulization Daily   senna-docusate  1 tablet Oral QHS   sodium chloride HYPERTONIC  4 mL Nebulization BID   thiamine  100 mg Per Tube Daily   valproic acid  500 mg Per Tube BID   vitamin A  10,000 Units Oral Daily   Continuous Infusions:  sodium chloride     sodium chloride Stopped  (05/27/22 1640)    LOS: 31 days   Raiford Noble, DO [facility] Available via Epic secure chat 7am-7pm After these hours, please refer to coverage provider listed on [email or web address] 06/19/2022, 5:47 PM

## 2022-06-19 NOTE — Progress Notes (Signed)
Modified Barium Swallow Progress Note  Patient Details  Name: Monica Hopkins MRN: [id number] Date of Birth: September 29, 1970  Today's Date: 06/19/2022  Modified Barium Swallow completed.  Full report located under Chart Review in the Imaging Section.  Brief recommendations include the following:  Clinical Impression  Ms. Kindley participated in repeat MBS today - she demonstrates continued moderate pharyngeal dysphagia, however with ongoing improvements since last study on 11/10.  Swallow was characterized by a pharyngeal delay and continued reduction in tongue base retraction, pharyngeal constriction, hyolaryngeal elevation, and anterior laryngeal movement, She still demonstrates vallecular residue and reduced epiglottic inversion (Limited inversion with liquids, improved inversion under the weight of pureed and chopped consistencies).  There was consistent penetration of thin liquids into the laryngeal vestibule, but patient had good sensory awareness, and spontaneous throat clearing was effective in ejecting penetrant when it reached the vocal folds. Penetration did not progress to aspiration with any trials.  Given her toleration of POs this last week, recommend advancing diet to dysphagia 1; continue thin liquids.   Pt should use PMV during all PO intake.  She was pleased with results today and is hopeful she will continue to progress. She declined mechanical solids due to mouth pain related to dentition.  SLP will continue to work with pt while admitted.   Swallow Evaluation Recommendations       SLP Diet Recommendations: Thin liquid;Dysphagia 1 (Puree) solids   Liquid Administration via: Cup;Straw   Medication Administration: Crushed with puree   Supervision: Patient able to self feed   Compensations: Small sips/bites       Oral Care Recommendations: Oral care QID      Clodagh Odenthal L. Samson Frederic, MA CCC/SLP Clinical Specialist - Acute Care SLP Acute Rehabilitation Services Office number  [phone]   Blenda Mounts Laurice 06/19/2022,2:14 PM

## 2022-06-20 ENCOUNTER — Inpatient Hospital Stay (HOSPITAL_COMMUNITY): Payer: Medicaid Other

## 2022-06-20 LAB — BPAM RBC
Blood Product Expiration Date: 202312102359
ISSUE DATE / TIME: 202311170536
Unit Type and Rh: 5100

## 2022-06-20 LAB — CBC WITH DIFFERENTIAL/PLATELET
Abs Immature Granulocytes: 0.02 K/uL (ref 0.00–0.07)
Basophils Absolute: 0 K/uL (ref 0.0–0.1)
Basophils Relative: 0 %
Eosinophils Absolute: 0.2 K/uL (ref 0.0–0.5)
Eosinophils Relative: 2 %
HCT: 24.9 % — ABNORMAL LOW (ref 36.0–46.0)
Hemoglobin: 8 g/dL — ABNORMAL LOW (ref 12.0–15.0)
Immature Granulocytes: 0 %
Lymphocytes Relative: 32 %
Lymphs Abs: 2.4 K/uL (ref 0.7–4.0)
MCH: 32 pg (ref 26.0–34.0)
MCHC: 32.1 g/dL (ref 30.0–36.0)
MCV: 99.6 fL (ref 80.0–100.0)
Monocytes Absolute: 0.8 K/uL (ref 0.1–1.0)
Monocytes Relative: 11 %
Neutro Abs: 4 K/uL (ref 1.7–7.7)
Neutrophils Relative %: 55 %
Platelets: 227 K/uL (ref 150–400)
RBC: 2.5 MIL/uL — ABNORMAL LOW (ref 3.87–5.11)
RDW: 18 % — ABNORMAL HIGH (ref 11.5–15.5)
WBC: 7.3 K/uL (ref 4.0–10.5)
nRBC: 0 % (ref 0.0–0.2)

## 2022-06-20 LAB — TYPE AND SCREEN
ABO/RH(D): O POS
Antibody Screen: NEGATIVE
Unit division: 0

## 2022-06-20 LAB — COMPREHENSIVE METABOLIC PANEL WITH GFR
ALT: 18 U/L (ref 0–44)
AST: 22 U/L (ref 15–41)
Albumin: 2.3 g/dL — ABNORMAL LOW (ref 3.5–5.0)
Alkaline Phosphatase: 72 U/L (ref 38–126)
Anion gap: 8 (ref 5–15)
BUN: 57 mg/dL — ABNORMAL HIGH (ref 6–20)
CO2: 29 mmol/L (ref 22–32)
Calcium: 9.5 mg/dL (ref 8.9–10.3)
Chloride: 99 mmol/L (ref 98–111)
Creatinine, Ser: 0.69 mg/dL (ref 0.44–1.00)
GFR, Estimated: >60 mL/min
Glucose, Bld: 109 mg/dL — ABNORMAL HIGH (ref 70–99)
Potassium: 4.7 mmol/L (ref 3.5–5.1)
Sodium: 136 mmol/L (ref 135–145)
Total Bilirubin: 0.3 mg/dL (ref 0.3–1.2)
Total Protein: 5.7 g/dL — ABNORMAL LOW (ref 6.5–8.1)

## 2022-06-20 LAB — GLUCOSE, CAPILLARY
Glucose-Capillary: 103 mg/dL — ABNORMAL HIGH (ref 70–99)
Glucose-Capillary: 104 mg/dL — ABNORMAL HIGH (ref 70–99)
Glucose-Capillary: 101 mg/dL — ABNORMAL HIGH (ref 70–99)
Glucose-Capillary: 100 mg/dL — ABNORMAL HIGH (ref 70–99)
Glucose-Capillary: 112 mg/dL — ABNORMAL HIGH (ref 70–99)
Glucose-Capillary: 135 mg/dL — ABNORMAL HIGH (ref 70–99)

## 2022-06-20 LAB — MAGNESIUM: Magnesium: 1.9 mg/dL (ref 1.7–2.4)

## 2022-06-20 LAB — PHOSPHORUS: Phosphorus: 3.4 mg/dL (ref 2.5–4.6)

## 2022-06-20 NOTE — Progress Notes (Addendum)
Mobility Specialist Progress Note:   06/20/22 1450  Mobility  Activity Ambulated with assistance in hallway  Level of Assistance Contact guard assist, steadying assist  Assistive Device Front wheel walker  Distance Ambulated (ft) 550 ft  Activity Response Tolerated well  $Mobility charge 1 Mobility   During Mobility:125 HR Post Mobility:  113 HR; 97% SpO2  Pt received in bed willing to participate in mobility. Complaints of being sore. Chair follow for safety but pt did not require any breaks. Left in bed ith call bell in reach and all need met.   Gareth Eagle Karman [place] Mobility Specialist Please contact via [employer] or  Rehab Office at [phone]

## 2022-06-20 NOTE — Progress Notes (Signed)
PROGRESS NOTE    Monica Hopkins  QMV:[id number] DOB: 04/22/1971 DOA: 05/19/2022 PCP: Leonard Downing, MD   Brief Narrative:  The patient is a 51 year old presented after cardiac arrest.  Found to be hypothermic and intubated in the emergency department. Significant Events 10/17: admitted post cardiac arrest; ROSC 26 minutes 10/20: off pressors, will awaken agitated, desaturates SBT.  10/23 failed extubation trial 10/25 trach 06/04/2022 she was transferred to the [facility] service 06/04/22 she desaturated on trach collar and PCCM was reconsulted. 06/05/22.  Continues to fail at swallowing and will need alternate means of feeding and she is resistant to PEG tube.  Consulted palliative care for further goals of care discussion and will also need a psychiatric evaluation 06/06/22. rapid response called given desaturation due to L mucous plugging and small effusion 06/07/22. Transferred to ICU after recurrent saturation.  She was refusing chest vest, metaneb.   06/08/22. Tolerating trach collar, accepting meds, c/o pain 06/09/22  IR consulted for PEG, unable to secondary to anatomy.  General surgery recommending conservative management, core track, diet as tolerated. 11/10 started on liquid diet.  Did poorly the first day, was noncompliant. 11/11 n.p.o. given poor compliance with proper diet, respiratory status decline, made NPO 11/12-13 tolerating liquid diet  06/17/2022.  She is tolerating full liquids and will have SLP reevaluate.  We will do a calorie count and see if the patient can tolerate a soft diet   Assessment and Plan:  S/p cardiac arrest found to be in PEA/shockable rhythm, in the setting of hypoxia due to pneumococcal pneumonia    Acute hypoxic respiratory failure due to Strep pneumonia pneumonia, complicated with aspiration and healthcare associated pneumonia with Enterobacter, Klebsiella and stenotrophomonas  --S/p trach, Intermittent mucous plugging. On trach collar. Poor cough  mechanics, deconditioned. Anxious and with rib pain  --continue nebs --10/23 BAL with klebsiella and enterobacter cloacae, 10/28 tracheal aspirate with enterobacter cloacae and stenotrophomonas, 11/4 tracheal aspirate with rare normal resp flora. C/w Bactrim course to completion -Continue with hypertonic saline nebs and breathing treatments with revefenacin 175 mcg daily -SpO2: 97 % O2 Flow Rate (L/min): 5 L/min FiO2 (%): 28 % --SLP working with Passy-Muir valve and having a repeat Evaluation for Diet advancement and now on a D1 Diet  -CXR done and showed "Unchanged appearance of the chest with bibasilar opacities, LEFT-greater-than-RIGHT which may represent airspace disease and/or Atelectasis."   New diagnosis of acute HFpEF  --echo with EF 55%, no RWMA, grade 1 diastolic dysfunction, d shaped septum suggesting RV pressure/volume overload -- She is + 19.751 L however this is unlikely accurate -Continuing metoprolol tartrate 12.5 mg p.o. twice daily and if needed we will give her a dose of Lasix but she received boluses for Hypotenstion    Hypotension -Asymptomatic -MAP was 58 yesterday AM -Given a 250 mL bolus yesterday and blood pressure remains low and blood count is dropping so she stopped it.  Transfuse 1 unit of blood on 06/19/22 -Order TED Hose -Continue to Monitor and Trend    Hypomagnesemia -Mild. Mag Level is now 1.9 -Replete with IV Mag Sulfate 2 grams -Continue to Monitor and Trend -Repeat Mag Level in the AM    Severe protein calorie malnutrition -Currently with Cortrak, and tolerating liquid diet and will have SLP reevaluate -PEG considered, but would require open procedure. Discussed w/ CCS, aware of RD and ST thoughts about PEG, but given operative risk and improvement in swallow, now on a diet, CCS does not think benefit outweighs risk. No  surgical placement recommended at this point per CCS. -Will need SLP reevaluation and calorie count and SLP re-evaluation And  diet has been advanced to dysphagia 1 diet -We will continue tube feedings but changed to nocturnal and continue calorie count follow-up on Monday   Acute septic/toxic encephalopathy -Resolved   Acute Rhabdomyolysis, resolved Hyperkalemia/hypokalemia, resolved AKI due to septic ATN, rhabdomyolysis, resolved Hypernatremia -> Hyponatremia  -Creatinine had been stable and patient's BUNs/creatinine has gone from 26/0.67  -> 38/0.76 -> 50/0.80 -> 57/0.69 -Potassium is now 4.7, magnesium is 1.9, and phosphorus level is now 3.4 -Avoid further nephrotoxic medications, contrast dyes, hypotension and dehydration -Repeat CMP in the a.m.   Anxiety depression bipolar  --psychiatry recommendations from 11/4 --Seroquel 50 mg TID prn for agitation only but this was scheduled and now discontinued, clonazepam 0.5 mg in the AM, 2 mg qhs, 0.5 mg BID prn.  Continued depakote 250 mg BID (this was later increased 500 mg BID).  Trazodone 50 mg qhs prn.   Rib fx from CPR -C/w Tylenol, Dilaudid prn, gabapentin   Hyponatremia -Mild. Patient's Na+ went from 136 -> 134 and today is 133 -> 136 -Continue to Monitor and Trend and getting IVF Boluses but will hold today given that she is getting blood -Repeat CMP in the AM    Acute blood loss anemia on anemia of chronic disease/Macrocytic Anemia  -Patient hemoglobin had stabilized now dropping again and went from 8.2/24.7 -> 7.1/22.3 -> 6.7/20.8 with an MCV of 100.2.  She was typed and screened and transfused 1 unit PRBCs and repeat was 8.2/25.6 yesterday and today is 8.0/24.9 -We will check FOBT and question if this is related to an upper GI bleeding given that her BUN is dissociated from her creatinine -Patient does have a history of a duodenal ulcer and may need further GI evaluation -Iron panel suggestive of chronic disease -Continue monitor for signs symptoms of bleeding; no overt bleeding noted -We have stopped her Toradol given her worsening BUN and possible  upper GI bleeding but patient denies any dark stools as of now -Repeat CBC in the AM   Diarrhea -Likely from TF -Continue to Monitor for Infectious Diarrhea   Thrombocytosis -Improved and now resolved platelet count is now 279  -> 236  -> 223 -> 227 -Continue monitor trend and repeat CBC in a.m.   Hypoalbuminemia -Patient's albumin level is now 2.8 -> 2.5 and dropped again to 2.3 x2 -Continue To monitor trend and repeat CMP in a.m.  DVT prophylaxis: heparin injection 5,000 Units Start: 05/19/22 2200 SCDs Start: 05/19/22 2151    Code Status: Full Code Family Communication: No family present at bedside   Disposition Plan:  Level of care: Progressive Status is: Inpatient Remains inpatient appropriate because: Anticipating discharging to CIR when she is improved and tolerating her diet.  She is not having some diarrhea   Consultants:  [facility] Transfer Psychiatry   Procedures:  As delineated as above  Antimicrobials:  Anti-infectives (From admission, onward)    Start     Dose/Rate Route Frequency Ordered Stop   06/08/22 1500  fluconazole (DIFLUCAN) tablet 200 mg        200 mg Per Tube Daily 06/08/22 1402 06/14/22 1012   06/04/22 1045  sulfamethoxazole-trimethoprim (BACTRIM DS) 800-160 MG per tablet 2 tablet        2 tablet Oral Every 12 hours 06/04/22 0950 06/17/22 2012   06/01/22 1030  meropenem (MERREM) 2 g in sodium chloride 0.9 %  100 mL IVPB  Status:  Discontinued        2 g 280 mL/hr over 30 Minutes Intravenous Every 12 hours 06/01/22 0933 06/04/22 0950   05/27/22 1600  piperacillin-tazobactam (ZOSYN) IVPB 3.375 g  Status:  Discontinued        3.375 g 12.5 mL/hr over 240 Minutes Intravenous Every 8 hours 05/27/22 1018 06/01/22 0930   05/27/22 1130  vancomycin (VANCOREADY) IVPB 1250 mg/250 mL  Status:  Discontinued        1,250 mg 166.7 mL/hr over 90 Minutes Intravenous Every 24 hours 05/27/22 1036 05/27/22 1037   05/27/22 1130  vancomycin  (VANCOREADY) IVPB 1250 mg/250 mL  Status:  Discontinued        1,250 mg 166.7 mL/hr over 90 Minutes Intravenous Every 48 hours 05/27/22 1037 05/28/22 0832   05/27/22 1115  vancomycin (VANCOCIN) IVPB 1000 mg/200 mL premix  Status:  Discontinued        1,000 mg 200 mL/hr over 60 Minutes Intravenous  Once 05/27/22 1018 05/27/22 1036   05/27/22 1115  piperacillin-tazobactam (ZOSYN) IVPB 3.375 g        3.375 g 100 mL/hr over 30 Minutes Intravenous  Once 05/27/22 1018 05/27/22 1119   05/20/22 2200  vancomycin (VANCOREADY) IVPB 750 mg/150 mL  Status:  Discontinued        750 mg 150 mL/hr over 60 Minutes Intravenous Every 24 hours 05/19/22 2158 05/20/22 1017   05/20/22 2000  vancomycin (VANCOCIN) IVPB 1000 mg/200 mL premix  Status:  Discontinued        1,000 mg 200 mL/hr over 60 Minutes Intravenous Every 24 hours 05/19/22 2107 05/19/22 2158   05/20/22 1115  cefTRIAXone (ROCEPHIN) 2 g in sodium chloride 0.9 % 100 mL IVPB        2 g 200 mL/hr over 30 Minutes Intravenous Every 24 hours 05/20/22 1017 05/25/22 1042   05/20/22 1000  ceFEPIme (MAXIPIME) 2 g in sodium chloride 0.9 % 100 mL IVPB  Status:  Discontinued        2 g 200 mL/hr over 30 Minutes Intravenous Every 12 hours 05/19/22 2107 05/20/22 1017   05/19/22 2200  vancomycin (VANCOREADY) IVPB 1250 mg/250 mL        1,250 mg 166.7 mL/hr over 90 Minutes Intravenous  Once 05/19/22 2158 05/20/22 0116   05/19/22 2100  ceFEPIme (MAXIPIME) 2 g in sodium chloride 0.9 % 100 mL IVPB        2 g 200 mL/hr over 30 Minutes Intravenous  Once 05/19/22 2048 05/19/22 2208   05/19/22 2100  vancomycin (VANCOREADY) IVPB 1500 mg/300 mL  Status:  Discontinued        1,500 mg 150 mL/hr over 120 Minutes Intravenous  Once 05/19/22 2048 05/19/22 2158       Subjective: Seen and examined at bedside and she was having some crampy abdominal pain and states that she is not with some diarrhea.  She states that she did not feel as well.  Objective: Vitals:   06/20/22  1241 06/20/22 1438 06/20/22 1625 06/20/22 1636  BP: 92/66   (!) 94/58  Pulse: 80 86 92 86  Resp: _0 Temp: 98 F (36.7 C)   98.3 F (36.8 C)  TempSrc: Oral   Oral  SpO2: 94%  97% 97%  Weight:      Height:        Intake/Output Summary (Last 24 hours) at 06/20/2022 1822 Last data filed at 06/20/2022 0000 Gross per 24 hour  Intake 460 ml  Output --  Net 460 ml   Filed Weights   06/14/22 0427 06/17/22 0455 06/18/22 0405  Weight: 49 kg 48.5 kg 48.2 kg    Examination: Physical Exam:  Constitutional: Thin Caucasian female currently no acute distress and happy that she is able to tolerate her diet. Respiratory: Diminished to auscultation bilaterally with coarse breath sounds and wearing ATC connected to her tracheostomy, no wheezing, rales, rhonchi or crackles. Normal respiratory effort and patient is not tachypenic. No accessory muscle use.  Unlabored breathing Cardiovascular: RRR, no murmurs / rubs / gallops. S1 and S2 auscultated. No extremity edema.  Abdomen: Soft, mildly-tender, non-distended. Bowel sounds positive.  GU: Deferred. Musculoskeletal: No clubbing / cyanosis of digits/nails. No joint deformity upper and lower extremities.  Skin: No rashes, lesions, ulcers on a limited skin evaluation. No induration; Warm and dry.  Neurologic: CN 2-12 grossly intact with no focal deficits. Romberg sign cerebellar reflexes not assessed.  Psychiatric: Normal judgment and insight. Alert and oriented x 3. Normal mood and appropriate affect.   Data Reviewed: I have personally reviewed following labs and imaging studies  CBC: Recent Labs  Lab 06/17/22 0903 06/18/22 0603 06/19/22 0138 06/19/22 1152 06/20/22 0117  WBC 6.6 6.0 7.2  --  7.3  NEUTROABS 4.1 3.5 4.2  --  4.0  HGB 8.2* 7.1* 6.7* 8.2* 8.0*  HCT 24.7* 22.3* 20.8* 25.6* 24.9*  MCV 99.6 101.8* 102.0*  --  99.6  PLT 279 236 223  --  678   Basic Metabolic Panel: Recent Labs  Lab 06/17/22 0903 06/18/22 0603  06/19/22 0138 06/20/22 0117  NA 136 134* 133* 136  K 4.8 4.9 4.9 4.7  CL 102 99 99 99  CO2 _0 GLUCOSE 98 91 103* 109*  BUN 26* 38* 50* 57*  CREATININE 0.67 0.76 0.80 0.69  CALCIUM 9.8 9.6 9.4 9.5  MG 1.7 1.6* 2.2 1.9  PHOS 3.9 3.9 3.7 3.4   GFR: Estimated Creatinine Clearance: 63.3 mL/min (by C-G formula based on SCr of 0.69 mg/dL). Liver Function Tests: Recent Labs  Lab 06/17/22 0903 06/18/22 0603 06/19/22 0138 06/20/22 0117  AST 39 _1 ALT _2 ALKPHOS 87 81 79 72  BILITOT 0.1* 0.3 0.4 0.3  PROT 6.9 6.0* 5.6* 5.7*  ALBUMIN 2.8* 2.5* 2.3* 2.3*   No results for input(s): "LIPASE", "AMYLASE" in the last 168 hours. No results for input(s): "AMMONIA" in the last 168 hours. Coagulation Profile: No results for input(s): "INR", "PROTIME" in the last 168 hours. Cardiac Enzymes: No results for input(s): "CKTOTAL", "CKMB", "CKMBINDEX", "TROPONINI" in the last 168 hours. BNP (last 3 results) No results for input(s): "PROBNP" in the last 8760 hours. HbA1C: No results for input(s): "HGBA1C" in the last 72 hours. CBG: Recent Labs  Lab 06/19/22 2030 06/19/22 2357 06/20/22 0359 06/20/22 0752 06/20/22 1243  GLUCAP 90 103* 104* 101* 100*   Lipid Profile: No results for input(s): "CHOL", "HDL", "LDLCALC", "TRIG", "CHOLHDL", "LDLDIRECT" in the last 72 hours. Thyroid Function Tests: No results for input(s): "TSH", "T4TOTAL", "FREET4", "T3FREE", "THYROIDAB" in the last 72 hours. Anemia Panel: No results for input(s): "VITAMINB12", "FOLATE", "FERRITIN", "TIBC", "IRON", "RETICCTPCT" in the last 72 hours. Sepsis Labs: No results for input(s): "PROCALCITON", "LATICACIDVEN" in the last 168 hours.  No results found for this or any previous visit (from the past 240 hour(s)).   Radiology Studies: DG CHEST PORT 1 VIEW  Result Date: 06/20/2022 CLINICAL DATA:  Shortness of breath. EXAM: PORTABLE CHEST 1 VIEW COMPARISON:  06/19/2022 and prior studies FINDINGS:  Small bore feeding tube entering the stomach with tip off the field of view and tracheostomy tube are again noted. Bibasilar opacities, LEFT-greater-than-RIGHT, are not significantly changed. There is no evidence of pneumothorax or large pleural effusion. No acute bony abnormalities are identified. IMPRESSION: Unchanged appearance of the chest with bibasilar opacities, LEFT-greater-than-RIGHT which may represent airspace disease and/or atelectasis. Electronically Signed   By: Margarette Canada M.D.   On: 06/20/2022 08:29   DG CHEST PORT 1 VIEW  Result Date: 06/19/2022 CLINICAL DATA:  Shortness of breath.  History of pneumonia. EXAM: PORTABLE CHEST 1 VIEW COMPARISON:  Radiographs 06/12/2022 and 06/09/2022.  CT 05/19/2022. FINDINGS: 0618 hours. The tracheostomy and feeding tube appear unchanged. There has been further improvement in the left-greater-than-right basilar airspace opacities no significant pleural effusion or pneumothorax demonstrated. The heart size and mediastinal contours are stable. The bones appear unchanged. IMPRESSION: Further improvement in left-greater-than-right basilar airspace opacities. No new findings. Electronically Signed   By: Richardean Sale M.D.   On: 06/19/2022 08:13     Scheduled Meds:  (feeding supplement) PROSource Plus  30 mL Oral BID BM   acetaminophen (TYLENOL) oral liquid 160 mg/5 mL  650 mg Per Tube QID   ascorbic acid  500 mg Per Tube BID   Chlorhexidine Gluconate Cloth  6 each Topical Daily   clonazePAM  0.5 mg Per Tube Daily   clonazePAM  2 mg Per Tube QHS   collagenase   Topical Daily   famotidine  20 mg Per Tube BID   feeding supplement  237 mL Oral TID WC   feeding supplement (VITAL AF 1.2 CAL)  1,000 mL Per Tube Q24H   fluticasone  1 spray Each Nare Daily   folic acid  1 mg Per Tube Daily   gabapentin  300 mg Per Tube BID   heparin  5,000 Units Subcutaneous Q8H   hydrocortisone   Rectal BID   lidocaine  1 patch Transdermal Q24H   magic mouthwash  5 mL  Oral TID   metoprolol tartrate  12.5 mg Per Tube BID   nutrition supplement (JUVEN)  1 packet Per Tube BID BM   mouth rinse  15 mL Mouth Rinse 4 times per day   pantoprazole  40 mg Oral Daily   revefenacin  175 mcg Nebulization Daily   senna-docusate  1 tablet Oral QHS   thiamine  100 mg Per Tube Daily   valproic acid  500 mg Per Tube BID   vitamin A  10,000 Units Oral Daily   Continuous Infusions:  sodium chloride     sodium chloride Stopped (05/27/22 1640)    LOS: 32 days   Raiford Noble, DO [facility] Available via Epic secure chat 7am-7pm After these hours, please refer to coverage provider listed on [email or web address] 06/20/2022, 6:22 PM

## 2022-06-21 LAB — CBC WITH DIFFERENTIAL/PLATELET
Abs Immature Granulocytes: 0.02 10*3/uL (ref 0.00–0.07)
Basophils Absolute: 0 10*3/uL (ref 0.0–0.1)
Basophils Relative: 1 %
Eosinophils Absolute: 0.2 10*3/uL (ref 0.0–0.5)
Eosinophils Relative: 3 %
HCT: 24.8 % — ABNORMAL LOW (ref 36.0–46.0)
Hemoglobin: 7.9 g/dL — ABNORMAL LOW (ref 12.0–15.0)
Immature Granulocytes: 0 %
Lymphocytes Relative: 39 %
Lymphs Abs: 2.6 10*3/uL (ref 0.7–4.0)
MCH: 32.2 pg (ref 26.0–34.0)
MCHC: 31.9 g/dL (ref 30.0–36.0)
MCV: 101.2 fL — ABNORMAL HIGH (ref 80.0–100.0)
Monocytes Absolute: 0.8 10*3/uL (ref 0.1–1.0)
Monocytes Relative: 11 %
Neutro Abs: 3.2 10*3/uL (ref 1.7–7.7)
Neutrophils Relative %: 46 %
Platelets: 232 10*3/uL (ref 150–400)
RBC: 2.45 MIL/uL — ABNORMAL LOW (ref 3.87–5.11)
RDW: 17.6 % — ABNORMAL HIGH (ref 11.5–15.5)
WBC: 6.8 10*3/uL (ref 4.0–10.5)
nRBC: 0 % (ref 0.0–0.2)

## 2022-06-21 LAB — COMPREHENSIVE METABOLIC PANEL
ALT: 30 U/L (ref 0–44)
AST: 35 U/L (ref 15–41)
Albumin: 2.4 g/dL — ABNORMAL LOW (ref 3.5–5.0)
Alkaline Phosphatase: 70 U/L (ref 38–126)
Anion gap: 8 (ref 5–15)
BUN: 53 mg/dL — ABNORMAL HIGH (ref 6–20)
CO2: 29 mmol/L (ref 22–32)
Calcium: 9.5 mg/dL (ref 8.9–10.3)
Chloride: 100 mmol/L (ref 98–111)
Creatinine, Ser: 0.53 mg/dL (ref 0.44–1.00)
GFR, Estimated: >60 mL/min (ref >60)
Glucose, Bld: 123 mg/dL — ABNORMAL HIGH (ref 70–99)
Potassium: 4.1 mmol/L (ref 3.5–5.1)
Sodium: 137 mmol/L (ref 135–145)
Total Bilirubin: 0.2 mg/dL — ABNORMAL LOW (ref 0.3–1.2)
Total Protein: 5.6 g/dL — ABNORMAL LOW (ref 6.5–8.1)

## 2022-06-21 LAB — GLUCOSE, CAPILLARY
Glucose-Capillary: 127 mg/dL — ABNORMAL HIGH (ref 70–99)
Glucose-Capillary: 99 mg/dL (ref 70–99)
Glucose-Capillary: 117 mg/dL — ABNORMAL HIGH (ref 70–99)
Glucose-Capillary: 94 mg/dL (ref 70–99)
Glucose-Capillary: 116 mg/dL — ABNORMAL HIGH (ref 70–99)

## 2022-06-21 LAB — MAGNESIUM: Magnesium: 1.8 mg/dL (ref 1.7–2.4)

## 2022-06-21 LAB — PHOSPHORUS: Phosphorus: 4.5 mg/dL (ref 2.5–4.6)

## 2022-06-21 MED ORDER — OXYCODONE HCL 5 MG PO TABS
5.0000 mg | ORAL_TABLET | ORAL | Status: DC | PRN
  Administered 2022-06-21 (×2): 5 mg via ORAL
  Filled 2022-06-21 (×3): qty 1

## 2022-06-21 MED ORDER — MAGNESIUM SULFATE 2 GM/50ML IV SOLN
2.0000 g | Freq: Once | INTRAVENOUS | Status: AC
  Administered 2022-06-21: 2 g via INTRAVENOUS
  Filled 2022-06-21: qty 50

## 2022-06-21 NOTE — Progress Notes (Signed)
Mobility Specialist Progress Note:   06/21/22 1600  Mobility  Activity Ambulated with assistance in hallway  Level of Assistance Contact guard assist, steadying assist  Assistive Device Four wheel walker  Distance Ambulated (ft) 550 ft  Activity Response Tolerated well  $Mobility charge 1 Mobility   Pt received in bed willing to participate in mobility. Complaints of her bottom being sore. Left in bed with call bell in reach and all needs met.  [name] Mobility Specialist Please contact via [employer] or  Rehab Office at [phone]

## 2022-06-21 NOTE — Progress Notes (Signed)
PROGRESS NOTE    Monica Hopkins  WJX:[id number] DOB: October 19, 1970 DOA: 05/19/2022 PCP: Leonard Downing, MD   Brief Narrative:  The patient is a 51 year old presented after cardiac arrest.  Found to be hypothermic and intubated in the emergency department. Significant Events 10/17: admitted post cardiac arrest; ROSC 26 minutes 10/20: off pressors, will awaken agitated, desaturates SBT.  10/23 failed extubation trial 10/25 trach 06/04/2022 she was transferred to the [facility] service 06/04/22 she desaturated on trach collar and PCCM was reconsulted. 06/05/22.  Continues to fail at swallowing and will need alternate means of feeding and she is resistant to PEG tube.  Consulted palliative care for further goals of care discussion and will also need a psychiatric evaluation 06/06/22. rapid response called given desaturation due to L mucous plugging and small effusion 06/07/22. Transferred to ICU after recurrent saturation.  She was refusing chest vest, metaneb.   06/08/22. Tolerating trach collar, accepting meds, c/o pain 06/09/22  IR consulted for PEG, unable to secondary to anatomy.  General surgery recommending conservative management, core track, diet as tolerated. 11/10 started on liquid diet.  Did poorly the first day, was noncompliant. 11/11 n.p.o. given poor compliance with proper diet, respiratory status decline, made NPO 11/12-13 tolerating liquid diet  06/17/2022.  She is tolerating full liquids and will have SLP reevaluate.  We will do a calorie count and see if the patient can tolerate a soft diet   Assessment and Plan:  S/p cardiac arrest found to be in PEA/shockable rhythm, in the setting of hypoxia due to pneumococcal pneumonia    Acute hypoxic respiratory failure due to Strep pneumonia pneumonia, complicated with aspiration and healthcare associated pneumonia with Enterobacter, Klebsiella and stenotrophomonas  --S/p trach, Intermittent mucous plugging. On trach collar. Poor cough  mechanics, deconditioned. Anxious and with rib pain  --continue nebs --10/23 BAL with klebsiella and enterobacter cloacae, 10/28 tracheal aspirate with enterobacter cloacae and stenotrophomonas, 11/4 tracheal aspirate with rare normal resp flora. C/w Bactrim course to completion -Continue with hypertonic saline nebs and breathing treatments with revefenacin 175 mcg daily -SpO2: 99 % O2 Flow Rate (L/min): 5 L/min FiO2 (%): 28 % --SLP working with Passy-Muir valve and having a repeat Evaluation for Diet advancement and now on a D1 Diet  -CXR done and showed "Unchanged appearance of the chest with bibasilar opacities, LEFT-greater-than-RIGHT which may represent airspace disease and/or Atelectasis."   New diagnosis of acute HFpEF  --echo with EF 55%, no RWMA, grade 1 diastolic dysfunction, d shaped septum suggesting RV pressure/volume overload -- She is + 22.148 L however this is unlikely accurate -Continuing metoprolol tartrate 12.5 mg p.o. twice daily and if needed we will give her a dose of Lasix but she received boluses for Hypotenstion    Hypotension, improving and stable -Asymptomatic -MAP on the lower side  -Given a 250 mL bolus yesterday and blood pressure remains low and blood count is dropping so she stopped it.  Transfuse 1 unit of blood on 06/19/22 -Order TED Hose -Continue to Monitor and Trend    Hypomagnesemia -Mild. Mag Level is now 1.8 -Replete with IV Mag Sulfate 2 grams -Continue to Monitor and Trend -Repeat Mag Level in the AM    Severe protein calorie malnutrition -Currently with Cortrak, and tolerating liquid diet and will have SLP reevaluate -PEG considered, but would require open procedure. Discussed w/ CCS, aware of RD and ST thoughts about PEG, but given operative risk and improvement in swallow, now on a diet, CCS does not think  benefit outweighs risk. No surgical placement recommended at this point per CCS. -SLP reevaluation and calorie count initiated and diet  has been advanced to dysphagia 1 diet -We will continue tube feedings but changed to nocturnal and continue calorie count follow-up on Monday   Acute Septic/Toxic Encephalopathy -Resolved   Acute Rhabdomyolysis, resolved Hyperkalemia/hypokalemia, resolved AKI due to septic ATN, rhabdomyolysis, resolved Hypernatremia -> Hyponatremia  -Creatinine had been stable and patient's BUNs/creatinine has gone from 26/0.67  -> 38/0.76 -> 50/0.80 -> 57/0.69 -> 53/0.53 -Potassium is now 4.1, magnesium is 1.8, and phosphorus level is now 2.4 -Avoid further nephrotoxic medications, contrast dyes, hypotension and dehydration -Repeat CMP in the a.m.   Anxiety and Depression  Bipolar  -Psychiatry recommendations from 11/4 --Seroquel 50 mg TID prn for agitation only but this was scheduled and now discontinued, clonazepam 0.5 mg in the AM, 2 mg qhs, 0.5 mg BID prn.  Continued depakote 250 mg BID (this was later increased 500 mg BID).  Trazodone 50 mg qhs prn.   Rib fx from CPR -C/w Tylenol, Dilaudid prn, Gabapentin -We will adjust and add oxycodone and start IV morphine as needed   Hyponatremia -Mild. Patient's Na+ went from 136 -> 134 and today is 133 -> 136 is now 137 -Continue to Monitor and Trend and getting IVF Boluses but will hold today given that she is getting blood -Repeat CMP in the AM    Acute blood loss anemia on anemia of chronic disease/Macrocytic Anemia  -Patient hemoglobin had stabilized now dropping again and went from 8.2/24.7 -> 7.1/22.3 -> 6.7/20.8.  She was typed and screened and transfused 1 unit PRBCs and after transfusion has been relatively stable with repeat this morning being 7.9/24.8 with an MCV of 101.2 -We will check FOBT and question if this is related to an upper GI bleeding given that her BUN is dissociated from her creatinine and FOBT is still pending -Patient does have a history of a duodenal ulcer and may need further GI evaluation -Iron panel suggestive of chronic  disease -Continue monitor for signs symptoms of bleeding; no overt bleeding noted -We have stopped her Toradol given her worsening BUN and possible upper GI bleeding but patient denies any dark stools as of now -Repeat CBC in the AM    Diarrhea -Likely from TF -Continue to Monitor for Infectious Diarrhea and she has no white count or fever.   Thrombocytosis -Improved and now resolved platelet count is now 279  -> 236  -> 223 -> 227 -> 232 -Continue monitor trend and repeat CBC in a.m.   Hypoalbuminemia -Patient's albumin level is now 2.8 -> 2.5 -> 2.3 x2 -> 2.4 -Continue To monitor trend and repeat CMP in a.m.  DVT prophylaxis: heparin injection 5,000 Units Start: 05/19/22 2200 SCDs Start: 05/19/22 2151    Code Status: Full Code Family Communication: No family currently at bedside  Disposition Plan:  Level of care: Progressive Status is: Inpatient Remains inpatient appropriate because: She is improving daily but will need to evaluate her drop in hemoglobin and obtain FOBT.  Pain is also an issue.  Diet is advancing in Will obtain calorie count and see if we can remove her Cortrak   Consultants:  [place] [facility] Transfer Psychiatry   Procedures:  As delineated as above  Antimicrobials:  Anti-infectives (From admission, onward)    Start     Dose/Rate Route Frequency Ordered Stop   06/08/22 1500  fluconazole (DIFLUCAN) tablet 200 mg  200 mg Per Tube Daily 06/08/22 1402 06/14/22 1012   06/04/22 1045  sulfamethoxazole-trimethoprim (BACTRIM DS) 800-160 MG per tablet 2 tablet        2 tablet Oral Every 12 hours 06/04/22 0950 06/17/22 2012   06/01/22 1030  meropenem (MERREM) 2 g in sodium chloride 0.9 % 100 mL IVPB  Status:  Discontinued        2 g 280 mL/hr over 30 Minutes Intravenous Every 12 hours 06/01/22 0933 06/04/22 0950   05/27/22 1600  piperacillin-tazobactam (ZOSYN) IVPB 3.375 g  Status:  Discontinued        3.375 g 12.5 mL/hr over 240  Minutes Intravenous Every 8 hours 05/27/22 1018 06/01/22 0930   05/27/22 1130  vancomycin (VANCOREADY) IVPB 1250 mg/250 mL  Status:  Discontinued        1,250 mg 166.7 mL/hr over 90 Minutes Intravenous Every 24 hours 05/27/22 1036 05/27/22 1037   05/27/22 1130  vancomycin (VANCOREADY) IVPB 1250 mg/250 mL  Status:  Discontinued        1,250 mg 166.7 mL/hr over 90 Minutes Intravenous Every 48 hours 05/27/22 1037 05/28/22 0832   05/27/22 1115  vancomycin (VANCOCIN) IVPB 1000 mg/200 mL premix  Status:  Discontinued        1,000 mg 200 mL/hr over 60 Minutes Intravenous  Once 05/27/22 1018 05/27/22 1036   05/27/22 1115  piperacillin-tazobactam (ZOSYN) IVPB 3.375 g        3.375 g 100 mL/hr over 30 Minutes Intravenous  Once 05/27/22 1018 05/27/22 1119   05/20/22 2200  vancomycin (VANCOREADY) IVPB 750 mg/150 mL  Status:  Discontinued        750 mg 150 mL/hr over 60 Minutes Intravenous Every 24 hours 05/19/22 2158 05/20/22 1017   05/20/22 2000  vancomycin (VANCOCIN) IVPB 1000 mg/200 mL premix  Status:  Discontinued        1,000 mg 200 mL/hr over 60 Minutes Intravenous Every 24 hours 05/19/22 2107 05/19/22 2158   05/20/22 1115  cefTRIAXone (ROCEPHIN) 2 g in sodium chloride 0.9 % 100 mL IVPB        2 g 200 mL/hr over 30 Minutes Intravenous Every 24 hours 05/20/22 1017 05/25/22 1042   05/20/22 1000  ceFEPIme (MAXIPIME) 2 g in sodium chloride 0.9 % 100 mL IVPB  Status:  Discontinued        2 g 200 mL/hr over 30 Minutes Intravenous Every 12 hours 05/19/22 2107 05/20/22 1017   05/19/22 2200  vancomycin (VANCOREADY) IVPB 1250 mg/250 mL        1,250 mg 166.7 mL/hr over 90 Minutes Intravenous  Once 05/19/22 2158 05/20/22 0116   05/19/22 2100  ceFEPIme (MAXIPIME) 2 g in sodium chloride 0.9 % 100 mL IVPB        2 g 200 mL/hr over 30 Minutes Intravenous  Once 05/19/22 2048 05/19/22 2208   05/19/22 2100  vancomycin (VANCOREADY) IVPB 1500 mg/300 mL  Status:  Discontinued        1,500 mg 150 mL/hr over 120  Minutes Intravenous  Once 05/19/22 2048 05/19/22 2158       Subjective: Seen and examined at bedside and she was having some breakthrough pain and states the pain regimen that she is on is not helping and she woke up in tears.  Has been ambulating fairly well.  States her abdominal pain is improved but continues to have some mild diarrhea.  No other concerns or complaints at this time.  Objective: Vitals:   06/21/22 1201 06/21/22  1355 06/21/22 1517 06/21/22 1658  BP: 100/60   100/60  Pulse: 80 92 88 98  Resp: _0 Temp: 98 F (36.7 C)   98 F (36.7 C)  TempSrc: Oral   Oral  SpO2: 100% 96% 100% 99%  Weight:      Height:        Intake/Output Summary (Last 24 hours) at 06/21/2022 1718 Last data filed at 06/21/2022 0600 Gross per 24 hour  Intake 2397 ml  Output --  Net 2397 ml   Filed Weights   06/17/22 0455 06/18/22 0405 06/21/22 0307  Weight: 48.5 kg 48.2 kg 47.8 kg   Examination: Physical Exam:  Constitutional: Extremely thin and chronically ill-appearing Caucasian female Neck: Tracheostomy is in place with a Passy-Muir valve connected to ETT when 5 L Respiratory: Diminished to auscultation bilaterally, no wheezing, rales, rhonchi or crackles. Normal respiratory effort and patient is not tachypenic. No accessory muscle use.  Cardiovascular: RRR, no murmurs / rubs / gallops. S1 and S2 auscultated. No extremity edema.  Abdomen: Soft, non-tender, non-distended. Bowel sounds positive.  GU: Deferred. Musculoskeletal: No clubbing / cyanosis of digits/nails. No joint deformity upper and lower extremities.  Skin: No rashes, lesions, ulcers on limited skin evaluation. No induration; Warm and dry.  Neurologic: CN 2-12 grossly intact with no focal deficits. Romberg sign and cerebellar reflexes not assessed.  Psychiatric: Normal judgment and insight. Alert and oriented x 3.  Slightly tearful and anxious mood  Data Reviewed: I have personally reviewed following labs and  imaging studies  CBC: Recent Labs  Lab 06/17/22 0903 06/18/22 0603 06/19/22 0138 06/19/22 1152 06/20/22 0117 06/21/22 0116  WBC 6.6 6.0 7.2  --  7.3 6.8  NEUTROABS 4.1 3.5 4.2  --  4.0 3.2  HGB 8.2* 7.1* 6.7* 8.2* 8.0* 7.9*  HCT 24.7* 22.3* 20.8* 25.6* 24.9* 24.8*  MCV 99.6 101.8* 102.0*  --  99.6 101.2*  PLT 279 236 223  --  227 751   Basic Metabolic Panel: Recent Labs  Lab 06/17/22 0903 06/18/22 0603 06/19/22 0138 06/20/22 0117 06/21/22 0116  NA 136 134* 133* 136 137  K 4.8 4.9 4.9 4.7 4.1  CL 102 99 99 99 100  CO2 _1 GLUCOSE 98 91 103* 109* 123*  BUN 26* 38* 50* 57* 53*  CREATININE 0.67 0.76 0.80 0.69 0.53  CALCIUM 9.8 9.6 9.4 9.5 9.5  MG 1.7 1.6* 2.2 1.9 1.8  PHOS 3.9 3.9 3.7 3.4 4.5   GFR: Estimated Creatinine Clearance: 62.8 mL/min (by C-G formula based on SCr of 0.53 mg/dL). Liver Function Tests: Recent Labs  Lab 06/17/22 0903 06/18/22 0603 06/19/22 0138 06/20/22 0117 06/21/22 0116  AST 39 _2 35  ALT _3 ALKPHOS 87 81 79 72 70  BILITOT 0.1* 0.3 0.4 0.3 0.2*  PROT 6.9 6.0* 5.6* 5.7* 5.6*  ALBUMIN 2.8* 2.5* 2.3* 2.3* 2.4*   No results for input(s): "LIPASE", "AMYLASE" in the last 168 hours. No results for input(s): "AMMONIA" in the last 168 hours. Coagulation Profile: No results for input(s): "INR", "PROTIME" in the last 168 hours. Cardiac Enzymes: No results for input(s): "CKTOTAL", "CKMB", "CKMBINDEX", "TROPONINI" in the last 168 hours. BNP (last 3 results) No results for input(s): "PROBNP" in the last 8760 hours. HbA1C: No results for input(s): "HGBA1C" in the last 72 hours. CBG: Recent Labs  Lab 06/20/22 2026 06/21/22 0001 06/21/22 0307 06/21/22 0842 06/21/22 1704  GLUCAP 135* 127*  99 117* 94   Lipid Profile: No results for input(s): "CHOL", "HDL", "LDLCALC", "TRIG", "CHOLHDL", "LDLDIRECT" in the last 72 hours. Thyroid Function Tests: No results for input(s): "TSH", "T4TOTAL", "FREET4", "T3FREE",  "THYROIDAB" in the last 72 hours. Anemia Panel: No results for input(s): "VITAMINB12", "FOLATE", "FERRITIN", "TIBC", "IRON", "RETICCTPCT" in the last 72 hours. Sepsis Labs: No results for input(s): "PROCALCITON", "LATICACIDVEN" in the last 168 hours.  No results found for this or any previous visit (from the past 240 hour(s)).   Radiology Studies: DG CHEST PORT 1 VIEW  Result Date: 06/20/2022 CLINICAL DATA:  Shortness of breath. EXAM: PORTABLE CHEST 1 VIEW COMPARISON:  06/19/2022 and prior studies FINDINGS: Small bore feeding tube entering the stomach with tip off the field of view and tracheostomy tube are again noted. Bibasilar opacities, LEFT-greater-than-RIGHT, are not significantly changed. There is no evidence of pneumothorax or large pleural effusion. No acute bony abnormalities are identified. IMPRESSION: Unchanged appearance of the chest with bibasilar opacities, LEFT-greater-than-RIGHT which may represent airspace disease and/or atelectasis. Electronically Signed   By: Margarette Canada M.D.   On: 06/20/2022 08:29    Scheduled Meds:  (feeding supplement) PROSource Plus  30 mL Oral BID BM   acetaminophen (TYLENOL) oral liquid 160 mg/5 mL  650 mg Per Tube QID   ascorbic acid  500 mg Per Tube BID   Chlorhexidine Gluconate Cloth  6 each Topical Daily   clonazePAM  0.5 mg Per Tube Daily   clonazePAM  2 mg Per Tube QHS   collagenase   Topical Daily   famotidine  20 mg Per Tube BID   feeding supplement  237 mL Oral TID WC   feeding supplement (VITAL AF 1.2 CAL)  1,000 mL Per Tube Q24H   fluticasone  1 spray Each Nare Daily   folic acid  1 mg Per Tube Daily   gabapentin  300 mg Per Tube BID   heparin  5,000 Units Subcutaneous Q8H   hydrocortisone   Rectal BID   lidocaine  1 patch Transdermal Q24H   magic mouthwash  5 mL Oral TID   metoprolol tartrate  12.5 mg Per Tube BID   nutrition supplement (JUVEN)  1 packet Per Tube BID BM   mouth rinse  15 mL Mouth Rinse 4 times per day    pantoprazole  40 mg Oral Daily   revefenacin  175 mcg Nebulization Daily   senna-docusate  1 tablet Oral QHS   thiamine  100 mg Per Tube Daily   valproic acid  500 mg Per Tube BID   vitamin A  10,000 Units Oral Daily   Continuous Infusions:  sodium chloride     sodium chloride Stopped (05/27/22 1640)    LOS: 33 days   Raiford Noble, DO [facility] Available via Epic secure chat 7am-7pm After these hours, please refer to coverage provider listed on [email or web address] 06/21/2022, 5:18 PM

## 2022-06-22 ENCOUNTER — Inpatient Hospital Stay (HOSPITAL_COMMUNITY): Payer: Medicaid Other

## 2022-06-22 DIAGNOSIS — J90 Pleural effusion, not elsewhere classified: Secondary | ICD-10-CM

## 2022-06-22 LAB — COMPREHENSIVE METABOLIC PANEL
ALT: 33 U/L (ref 0–44)
AST: 34 U/L (ref 15–41)
Albumin: 2.4 g/dL — ABNORMAL LOW (ref 3.5–5.0)
Alkaline Phosphatase: 64 U/L (ref 38–126)
Anion gap: 9 (ref 5–15)
BUN: 47 mg/dL — ABNORMAL HIGH (ref 6–20)
CO2: 29 mmol/L (ref 22–32)
Calcium: 9.4 mg/dL (ref 8.9–10.3)
Chloride: 98 mmol/L (ref 98–111)
Creatinine, Ser: 0.45 mg/dL (ref 0.44–1.00)
GFR, Estimated: >60 mL/min (ref >60)
Glucose, Bld: 107 mg/dL — ABNORMAL HIGH (ref 70–99)
Potassium: 3.9 mmol/L (ref 3.5–5.1)
Sodium: 136 mmol/L (ref 135–145)
Total Bilirubin: 0.3 mg/dL (ref 0.3–1.2)
Total Protein: 5.5 g/dL — ABNORMAL LOW (ref 6.5–8.1)

## 2022-06-22 LAB — GLUCOSE, CAPILLARY
Glucose-Capillary: 102 mg/dL — ABNORMAL HIGH (ref 70–99)
Glucose-Capillary: 128 mg/dL — ABNORMAL HIGH (ref 70–99)
Glucose-Capillary: 136 mg/dL — ABNORMAL HIGH (ref 70–99)
Glucose-Capillary: 93 mg/dL (ref 70–99)
Glucose-Capillary: 96 mg/dL (ref 70–99)
Glucose-Capillary: 96 mg/dL (ref 70–99)

## 2022-06-22 LAB — CBC WITH DIFFERENTIAL/PLATELET
Abs Immature Granulocytes: 0.02 10*3/uL (ref 0.00–0.07)
Basophils Absolute: 0 10*3/uL (ref 0.0–0.1)
Basophils Relative: 1 %
Eosinophils Absolute: 0.1 10*3/uL (ref 0.0–0.5)
Eosinophils Relative: 2 %
HCT: 25.4 % — ABNORMAL LOW (ref 36.0–46.0)
Hemoglobin: 8 g/dL — ABNORMAL LOW (ref 12.0–15.0)
Immature Granulocytes: 0 %
Lymphocytes Relative: 42 %
Lymphs Abs: 2.6 10*3/uL (ref 0.7–4.0)
MCH: 32.4 pg (ref 26.0–34.0)
MCHC: 31.5 g/dL (ref 30.0–36.0)
MCV: 102.8 fL — ABNORMAL HIGH (ref 80.0–100.0)
Monocytes Absolute: 0.8 10*3/uL (ref 0.1–1.0)
Monocytes Relative: 12 %
Neutro Abs: 2.7 10*3/uL (ref 1.7–7.7)
Neutrophils Relative %: 43 %
Platelets: 227 10*3/uL (ref 150–400)
RBC: 2.47 MIL/uL — ABNORMAL LOW (ref 3.87–5.11)
RDW: 17.1 % — ABNORMAL HIGH (ref 11.5–15.5)
WBC: 6.2 10*3/uL (ref 4.0–10.5)
nRBC: 0 % (ref 0.0–0.2)

## 2022-06-22 LAB — PHOSPHORUS: Phosphorus: 4 mg/dL (ref 2.5–4.6)

## 2022-06-22 LAB — MAGNESIUM: Magnesium: 2 mg/dL (ref 1.7–2.4)

## 2022-06-22 MED ORDER — OXYCODONE HCL 5 MG PO TABS
5.0000 mg | ORAL_TABLET | ORAL | Status: DC | PRN
  Administered 2022-06-23: 5 mg via ORAL
  Administered 2022-06-23 – 2022-06-24 (×2): 10 mg via ORAL
  Filled 2022-06-22: qty 2
  Filled 2022-06-22: qty 1
  Filled 2022-06-22: qty 2

## 2022-06-22 NOTE — Progress Notes (Signed)
Occupational Therapy Treatment Patient Details Name: Monica Hopkins MRN: [id number] DOB: 1971-02-08 Today's Date: 06/22/2022   History of present illness Pt is a 51 y.o. female admitted 05/19/22 with cardiac arrest at home, ROSC 26 min, hypothermic, rib fx from CPR. Intubated 10/17, failed extubation 10/23; trach placed 10/25. Pt with new HFpEF. Rapid response 11/4 due to recurrent desaturation with L mucous plugging and small effusion; transfer back to ICU 11/5-11/8. IR consulted for PEG 11/7. PMH includes bipolar disorder.   OT comments  Monica Hopkins is making excellent progress. She tolerated multiple sit<>stand transfers and >household distance ambulation with rollator. Pt also donned brief with min A, and encouraged to continue to complete self care as indep as possible while in acute care. She verbalized understanding. VSS on 6L to trach, she did not need a sitting rest break, did not have increased WOB or LOB and was able to dual task during ambulation. OT to continue to follow acutely. D/c updated to home with [facility] and assist from family.    Recommendations for follow up therapy are one component of a multi-disciplinary discharge planning process, led by the attending physician.  Recommendations may be updated based on patient status, additional functional criteria and insurance authorization.    Follow Up Recommendations  Home health OT     Assistance Recommended at Discharge Frequent or constant Supervision/Assistance  Patient can return home with the following  Two people to help with walking and/or transfers;A lot of help with bathing/dressing/bathroom;Assistance with cooking/housework;Direct supervision/assist for medications management;Direct supervision/assist for financial management;Assist for transportation;Help with stairs or ramp for entrance   Equipment Recommendations  BSC/3in1;Other (comment) (rollator)       Precautions / Restrictions Precautions Precautions: Fall;Other  (comment) Precaution Comments: cortrak, trach collar, multiple wounds along spine and sacrum, watch BP and SpO2 Restrictions Weight Bearing Restrictions: No       Mobility Bed Mobility Overal bed mobility: Needs Assistance             General bed mobility comments: EOB upon arrival    Transfers Overall transfer level: Needs assistance Equipment used: Rollator (4 wheels) Transfers: Sit to/from Stand Sit to Stand: Min guard           General transfer comment: cue for rollator management     Balance Overall balance assessment: Needs assistance Sitting-balance support: Feet supported Sitting balance-Leahy Scale: Fair     Standing balance support: Bilateral upper extremity supported, During functional activity Standing balance-Leahy Scale: Poor                             ADL either performed or assessed with clinical judgement   ADL Overall ADL's : Needs assistance/impaired Eating/Feeding: Set up;Sitting   Grooming: Set up;Sitting Grooming Details (indicate cue type and reason): at the sink             Lower Body Dressing: Minimal assistance;Sit to/from stand Lower Body Dressing Details (indicate cue type and reason): donned brief Toilet Transfer: Diplomatic Services operational officer (4 wheels);Ambulation Toilet Transfer Details (indicate cue type and reason): simulated         Functional mobility during ADLs: Min guard;Rollator (4 wheels) General ADL Comments: ambulated >household distance without sitting rest break, increased SOB or LOB    Extremity/Trunk Assessment Upper Extremity Assessment Upper Extremity Assessment: RUE deficits/detail;LUE deficits/detail RUE Deficits / Details: 2/5 shoulder, 3/5 elbow, 4/5 gross grip RUE Coordination: decreased fine motor;decreased gross motor LUE Deficits / Details: 2/5 shoulder, 3/5 elbow,  3+/5 gross grip LUE Coordination: decreased fine motor;decreased gross motor   Lower Extremity Assessment Lower Extremity  Assessment: Defer to PT evaluation        Vision   Vision Assessment?: No apparent visual deficits   Perception Perception Perception: Within Functional Limits   Praxis Praxis Praxis: Intact    Cognition Arousal/Alertness: Awake/alert Behavior During Therapy: WFL for tasks assessed/performed Overall Cognitive Status: Impaired/Different from baseline Area of Impairment: Attention, Memory, Awareness, Problem solving                   Current Attention Level: Alternating Memory: Decreased short-term memory     Awareness: Emergent Problem Solving: Requires verbal cues General Comments: motivated to progress, able to dual task with walking and talking. Cues for rollator management and self awareness of symptoms              General Comments VSS on 6L to trach, PSMV donned    Pertinent Vitals/ Pain       Pain Assessment Pain Assessment: Faces Faces Pain Scale: Hurts little more Pain Location: bottom and teeth Pain Descriptors / Indicators: Grimacing, Discomfort Pain Intervention(s): Limited activity within patient's tolerance, Monitored during session   Frequency  Min 2X/week        Progress Toward Goals  OT Goals(current goals can now be found in the care plan section)  Progress towards OT goals: Progressing toward goals  Acute Rehab OT Goals OT Goal Formulation: With patient Time For Goal Achievement: 06/26/22 Potential to Achieve Goals: Good ADL Goals Pt Will Perform Upper Body Dressing: with supervision;sitting Pt Will Transfer to Toilet: with min assist;ambulating;bedside commode Pt/caregiver will Perform Home Exercise Program: Increased strength;Both right and left upper extremity;With minimal assist Additional ADL Goal #1: Will participate in reaching activities outside BoS without LOB. Additional ADL Goal #2: Pt will demonstrate ability to follow 2 step commands with 75% accuracy.  Plan Discharge plan needs to be updated       AM-PAC OT "6  Clicks" Daily Activity     Outcome Measure   Help from another person eating meals?: A Little Help from another person taking care of personal grooming?: A Little Help from another person toileting, which includes using toliet, bedpan, or urinal?: A Little Help from another person bathing (including washing, rinsing, drying)?: A Little Help from another person to put on and taking off regular upper body clothing?: A Little Help from another person to put on and taking off regular lower body clothing?: A Little 6 Click Score: 18    End of Session Equipment Utilized During Treatment: Rollator (4 wheels);Oxygen  OT Visit Diagnosis: Muscle weakness (generalized) (M62.81);Other symptoms and signs involving cognitive function;Pain   Activity Tolerance Patient tolerated treatment well   Patient Left with call bell/phone within reach;Other (comment);with family/visitor present   Nurse Communication Mobility status        Time: [phone] OT Time Calculation (min): 35 min  Charges: OT General Charges $OT Visit: 1 Visit OT Treatments $Therapeutic Activity: 23-37 mins     Donia Pounds 06/22/2022, 3:17 PM

## 2022-06-22 NOTE — Progress Notes (Signed)
Physical Therapy Treatment Patient Details Name: Monica Hopkins MRN: [id number] DOB: [birth year] Today's Date: 06/22/2022   History of Present Illness Pt is a 51 y.o. female admitted 05/19/22 with cardiac arrest at home, ROSC 26 min, hypothermic, rib fx from CPR. Intubated 10/17, failed extubation 10/23; trach placed 10/25. Pt with new HFpEF. Rapid response 11/4 due to recurrent desaturation with L mucous plugging and small effusion; transfer back to ICU 11/5-11/8. IR consulted for PEG 11/7. PMH includes bipolar disorder.    PT Comments    Pt is continuing to demonstrate consistent, good progress with mobility. She just ambulated in the hallway with OT using a rollator prior to PT's arrival. She was consistently requiring only min guard assist to ambulate and for transfers today. Focused session on further improving her standing balance through having pt reach off BOS and perform dynamic tasks at the sink with 1-no UE support. Pt requiring light minA intermittently to maintain her balance, but was able to stand for > 15 minutes before fatiguing. As pt has progressed greatly and to a level her family can assist her at home, updated d/c recs to home with HHPT and family support. Pt does report x3 STE her parent's home, so will plan to focus on stair training in a future session. Will continue to follow acutely.     Recommendations for follow up therapy are one component of a multi-disciplinary discharge planning process, led by the attending physician.  Recommendations may be updated based on patient status, additional functional criteria and insurance authorization.  Follow Up Recommendations  Home health PT Can patient physically be transported by private vehicle: Yes   Assistance Recommended at Discharge Intermittent Supervision/Assistance  Patient can return home with the following Help with stairs or ramp for entrance;Assist for transportation;Assistance with cooking/housework;A little help  with walking and/or transfers;A little help with bathing/dressing/bathroom   Equipment Recommendations  Rollator (4 wheels);BSC/3in1    Recommendations for Other Services       Precautions / Restrictions Precautions Precautions: Fall;Other (comment) Precaution Comments: cortrak, trach collar, multiple wounds along spine and sacrum, watch BP and SpO2 Restrictions Weight Bearing Restrictions: No     Mobility  Bed Mobility Overal bed mobility: Needs Assistance Bed Mobility: Sit to Supine       Sit to supine: HOB elevated, Min assist   General bed mobility comments: MinA to lift legs onto bed with return to supine as pt reported fatigue    Transfers Overall transfer level: Needs assistance Equipment used: Rollator (4 wheels) Transfers: Sit to/from Stand Sit to Stand: Min guard           General transfer comment: Min guard assist to come to stand from chair to rollator. Needs cues for brake management.    Ambulation/Gait Ambulation/Gait assistance: Min guard Gait Distance (Feet): 20 Feet Assistive device: Rollator (4 wheels) Gait Pattern/deviations: Step-through pattern, Decreased stride length, Trunk flexed, Narrow base of support, Knee flexed in stance - left Gait velocity: decreased Gait velocity interpretation: <1.31 ft/sec, indicative of household ambulator   General Gait Details: Pt ambulating short distance in room, focusing session on standing balance training instead as pt just ambulated in hallway with OT. Min guard assist for safety   Stairs             Wheelchair Mobility    Modified Rankin (Stroke Patients Only)       Balance Overall balance assessment: Needs assistance Sitting-balance support: Feet supported Sitting balance-Leahy Scale: Fair Sitting balance - Comments: EOB with  supervision   Standing balance support: Bilateral upper extremity supported, During functional activity, No upper extremity supported, Single extremity  supported Standing balance-Leahy Scale: Fair Standing balance comment: Pt standing at sink >15 min, reaching min-mod off BOS to wash self with 1-no UE support. Intermittent trunk sway, up to minA to be safe and maintain balance with dynamic tasks. Uses rollator for mobility                            Cognition Arousal/Alertness: Awake/alert Behavior During Therapy: WFL for tasks assessed/performed, Anxious Overall Cognitive Status: Impaired/Different from baseline Area of Impairment: Attention, Memory, Awareness, Problem solving                   Current Attention Level: Alternating Memory: Decreased short-term memory     Awareness: Emergent Problem Solving: Requires verbal cues General Comments: motivated to progress, able to dual task with standing activities. Pt anxious to see her chest/abdomen in the mirror when washing up. Anxious in regards to abnormal sensation in her throat when returning to supine today. Needs redirecting intermittently        Exercises      General Comments General comments (skin integrity, edema, etc.): VSS      Pertinent Vitals/Pain Pain Assessment Pain Assessment: Faces Faces Pain Scale: Hurts little more Pain Location: buttocks, throat, ears Pain Descriptors / Indicators: Grimacing, Discomfort, Other (Comment) ("popping" of ears; "like a broom in my throat") Pain Intervention(s): Limited activity within patient's tolerance, Monitored during session, Repositioned, Patient requesting pain meds-RN notified    Home Living                          Prior Function            PT Goals (current goals can now be found in the care plan section) Acute Rehab PT Goals Patient Stated Goal: to go home PT Goal Formulation: With patient Time For Goal Achievement: 06/25/22 Potential to Achieve Goals: Good Progress towards PT goals: Progressing toward goals    Frequency    Min 3X/week      PT Plan Equipment  recommendations need to be updated;Discharge plan needs to be updated    Co-evaluation              AM-PAC PT "6 Clicks" Mobility   Outcome Measure  Help needed turning from your back to your side while in a flat bed without using bedrails?: A Little Help needed moving from lying on your back to sitting on the side of a flat bed without using bedrails?: A Little Help needed moving to and from a bed to a chair (including a wheelchair)?: A Little Help needed standing up from a chair using your arms (e.g., wheelchair or bedside chair)?: A Little Help needed to walk in hospital room?: A Little Help needed climbing 3-5 steps with a railing? : A Lot 6 Click Score: 17    End of Session Equipment Utilized During Treatment: Oxygen Activity Tolerance: Patient tolerated treatment well Patient left: with call bell/phone within reach;in bed Nurse Communication: Mobility status;Other (comment);Patient requests pain meds (pt reporting throat pain) PT Visit Diagnosis: Other abnormalities of gait and mobility (R26.89);Muscle weakness (generalized) (M62.81);Unsteadiness on feet (R26.81);Other symptoms and signs involving the nervous system (R29.898);Difficulty in walking, not elsewhere classified (R26.2)     Time: 6387-5643 PT Time Calculation (min) (ACUTE ONLY): 41 min  Charges:  $Therapeutic Activity: 8-22  mins $Neuromuscular Re-education: 23-37 mins                     Raymond Gurney, PT, DPT Acute Rehabilitation Services  Office: [phone]    Jewel Baize 06/22/2022, 5:49 PM

## 2022-06-22 NOTE — Progress Notes (Signed)
Speech Language Pathology Treatment: Dysphagia;Passy Muir Speaking valve  Patient Details Name: Monica Hopkins MRN: [id number] DOB: [birth year] Today's Date: 06/22/2022 Time: 4098-1191 SLP Time Calculation (min) (ACUTE ONLY): 24 min  Assessment / Plan / Recommendation Clinical Impression  Monica Hopkins was in good spirits this am.  PMV in place. She is wearing it all waking hours. Demonstrated placement/removal, which she was able to execute independently.  Voice is strong and speech is fully intelligible.  Pt self-fed a container of pudding with occasional cues needed for effortful swallow -  requires f/u sub-swallows due to vallecular residue (she does this independently). She appears to be tolerating diet well. Today's CXR with no new or worsening lung opacity.    No s/s of aspiration.  Pt is making excellent progress and is working hard at recovery.   HPI HPI: Pt is a 51 y.o. female admitted 05/19/22 after cardiac arrest at home, ROSC 26 min, hypothermic. ETT 10/17-10/23; reintubated 10/23-trach10/25. Pt with new HFpEF. MRI brain negative for acute changes. CXR 10/30: Stable multifocal pulmonary infiltrates and small left pleural effusion. MBS 11/3 rx NPO; G-tube being considered and IR consulted, but per radiology, anatomy is highly unfavorable and a relative contraindication. PMH: bipolar disorder.      SLP Plan  Continue with current plan of care      Recommendations for follow up therapy are one component of a multi-disciplinary discharge planning process, led by the attending physician.  Recommendations may be updated based on patient status, additional functional criteria and insurance authorization.    Recommendations  Diet recommendations: Dysphagia 1 (puree);Thin liquid Liquids provided via: Cup;Straw Medication Administration: Via alternative means Supervision: Intermittent supervision to cue for compensatory strategies Compensations: Effortful swallow Postural Changes and/or  Swallow Maneuvers: Seated upright 90 degrees      Patient may use Passy-Muir Speech Valve: During PO intake/meals;During all waking hours (remove during sleep) PMSV Supervision: Intermittent         Oral Care Recommendations: Oral care BID Follow Up Recommendations: Acute inpatient rehab (3hours/day) Assistance recommended at discharge: Frequent or constant Supervision/Assistance SLP Visit Diagnosis: Dysphagia, pharyngeal phase (R13.13) Plan: Continue with current plan of care         Monica Schubring L. Samson Frederic, MA CCC/SLP Clinical Specialist - Acute Care SLP Acute Rehabilitation Services Office number [phone]   Monica Hopkins  06/22/2022, 11:28 AM

## 2022-06-22 NOTE — Progress Notes (Signed)
Mobility Specialist Progress Note:   06/22/22 1023  Mobility  Activity Ambulated with assistance in hallway  Level of Assistance Contact guard assist, steadying assist  Assistive Device Four wheel walker  Distance Ambulated (ft) 550 ft  Activity Response Tolerated well  $Mobility charge 1 Mobility   Pt received in bed willing to participate in mobility. Complaints of bottom being sore. Left in bed with call bell in reach and all needs met.   Gareth Eagle Elizbeth Posa Mobility Specialist Please contact via [employer] or  Rehab Office at [phone]

## 2022-06-22 NOTE — Progress Notes (Signed)
PROGRESS NOTE    Monica Hopkins  OEU:[id number] DOB: 19-Jul-1971 DOA: 05/19/2022 PCP: Leonard Downing, MD   Brief Narrative:  The patient is a 51 year old presented after cardiac arrest.  Found to be hypothermic and intubated in the emergency department. Significant Events 10/17: admitted post cardiac arrest; ROSC 26 minutes 10/20: off pressors, will awaken agitated, desaturates SBT.  10/23 failed extubation trial 10/25 trach 06/04/2022 she was transferred to the [facility] service 06/04/22 she desaturated on trach collar and PCCM was reconsulted. 06/05/22.  Continues to fail at swallowing and will need alternate means of feeding and she is resistant to PEG tube.  Consulted palliative care for further goals of care discussion and will also need a psychiatric evaluation 06/06/22. rapid response called given desaturation due to L mucous plugging and small effusion 06/07/22. Transferred to ICU after recurrent saturation.  She was refusing chest vest, metaneb.   06/08/22. Tolerating trach collar, accepting meds, c/o pain 06/09/22  IR consulted for PEG, unable to secondary to anatomy.  General surgery recommending conservative management, core track, diet as tolerated. 11/10 started on liquid diet.  Did poorly the first day, was noncompliant. 11/11 n.p.o. given poor compliance with proper diet, respiratory status decline, made NPO 11/12-13 tolerating liquid diet  06/17/2022.  She is tolerating full liquids and will have SLP reevaluate.  We will do a calorie count and see if the patient can tolerate a soft diet Patient is now on a dysphagia 1 diet and doing a calorie count and we will give her another day to see if she tolerates her meals okay.  If she does we can remove her core track tube and PT OT have now changed the recommendations to home with home health given that she is doing much better from mobility standpoint and ambulated 550 feet.  We will continue the Isharani p.o. 3 times daily and Prosource  +30 mL twice daily   Assessment and Plan:  S/p cardiac arrest found to be in PEA/shockable rhythm, in the setting of hypoxia due to pneumococcal pneumonia    Acute hypoxic respiratory failure due to Strep pneumonia pneumonia, complicated with aspiration and healthcare associated pneumonia with Enterobacter, Klebsiella and stenotrophomonas  --S/p trach, Intermittent mucous plugging. On trach collar. Poor cough mechanics, deconditioned. Anxious and with rib pain  --continue nebs --10/23 BAL with klebsiella and enterobacter cloacae, 10/28 tracheal aspirate with enterobacter cloacae and stenotrophomonas, 11/4 tracheal aspirate with rare normal resp flora. C/w Bactrim course to completion -Continue with hypertonic saline nebs and breathing treatments with revefenacin 175 mcg daily -SpO2: 96 % O2 Flow Rate (L/min): 5 L/min FiO2 (%): 28 % --SLP working with Passy-Muir valve and having a repeat Evaluation for Diet advancement and now on a D1 Diet  -CXR done today and showed  Stable patchy opacities in the left lung base and right infrahilar area. No new, further or worsening lung opacity. Support apparatus stable."   New diagnosis of acute HFpEF  --echo with EF 55%, no RWMA, grade 1 diastolic dysfunction, d shaped septum suggesting RV pressure/volume overload -- She is + 17.296 L however this is unlikely accurate -Continuing metoprolol tartrate 12.5 mg p.o. twice daily and if needed we will give her a dose of Lasix but she received boluses for Hypotenstion    Hypotension, improving and stable -Asymptomatic -MAP on the lower side  -Given a 250 mL bolus yesterday and blood pressure remains low and blood count is dropping so she stopped it.  Transfuse 1 unit of blood on  06/19/22 -Order TED Hose -Continue to Monitor and Trend    Hypomagnesemia -Mild. Mag Level is now 2.0 -Replete with IV Mag Sulfate 2 grams yesterday -Continue to Monitor and Trend -Repeat Mag Level in the AM    Severe protein  calorie malnutrition -Currently with Cortrak, and tolerating liquid diet and will have SLP reevaluate -PEG considered, but would require open procedure. Discussed w/ CCS, aware of RD and ST thoughts about PEG, but given operative risk and improvement in swallow, now on a diet, CCS does not think benefit outweighs risk. No surgical placement recommended at this point per CCS. -SLP reevaluation and calorie count initiated and diet has been advanced to dysphagia 1 diet -We will continue tube feedings but changed to nocturnal and continue calorie count follow-up tomorrow given that the calorie count this week and was not fully documented but it appears that she is going to maintain her oral nutrition   Acute Septic/Toxic Encephalopathy -Resolved   Acute Rhabdomyolysis, resolved Hyperkalemia/hypokalemia, resolved AKI due to septic ATN, rhabdomyolysis, resolved Hypernatremia -> Hyponatremia  -Creatinine had been stable and patient's BUNs/creatinine has gone from 26/0.67  -> 38/0.76 -> 50/0.80 -> 57/0.69 -> 53/0.53 and is now further stable at 47/0.45 -Potassium is now 3.9, magnesium is 2.0, and phosphorus level is now 4.0 -Avoid further nephrotoxic medications, contrast dyes, hypotension and dehydration -Repeat CMP in the a.m.   Anxiety and Depression  Bipolar  -Psychiatry recommendations from 11/4 --Seroquel 50 mg TID prn for agitation only but this was scheduled and now discontinued, clonazepam 0.5 mg in the AM, 2 mg qhs, 0.5 mg BID prn.  Continued depakote 250 mg BID (this was later increased 500 mg BID).  Trazodone 50 mg qhs prn.   Rib fx from CPR but she also has a history of chronic pain -C/w Tylenol, Dilaudid prn, Gabapentin -She was on Suboxone previously but lost her insurance and was getting it via alternative means -We will adjust and add oxycodone and start IV morphine as needed; will increase the oxycodone dose to 5 to 10 mg given her uncontrolled pain   Hyponatremia -Mild.  Patient's Na+ went from 136 -> 134 and today is 133 -> 136 is now 137 yesterday and today is 136 -Continue to Monitor and Trend and getting IVF Boluses but will hold today given that she is getting blood -Repeat CMP in the AM    Acute blood loss anemia on anemia of chronic disease/Macrocytic Anemia  -Patient hemoglobin had stabilized now dropping again and went from 8.2/24.7 -> 7.1/22.3 -> 6.7/20.8.  She was typed and screened and transfused 1 unit PRBCs and after transfusion has been relatively stable with repeat yesterday morning being 7.9/24.8 with an MCV of 101.2 and is now 8.0/25.4 with an MCV of 102.8 -We will check FOBT and question if this is related to an upper GI bleeding given that her BUN is dissociated from her creatinine and FOBT is still pending -Patient does have a history of a duodenal ulcer and may need further GI evaluation but currently she appears stable -Iron panel suggestive of chronic disease -Continue monitor for signs symptoms of bleeding; no overt bleeding noted -We have stopped her Toradol given her worsening BUN and possible upper GI bleeding but patient denies any dark stools as of now -Repeat CBC in the AM    Diarrhea, improving -Likely from TF -Continue to Monitor for Infectious Diarrhea and she has no white count or fever.   Thrombocytosis -Improved and now resolved platelet count is  now 279  -> 236  -> 223 -> 227 -> 232 and is now 227 -Continue monitor trend and repeat CBC in a.m.   Hypoalbuminemia -Patient's albumin level is now 2.8 -> 2.5 -> 2.3 x2 -> 2.4 x2 -Continue To monitor trend and repeat CMP in a.m.  DVT prophylaxis: heparin injection 5,000 Units Start: 05/19/22 2200 SCDs Start: 05/19/22 2151    Code Status: Full Code Family Communication: No family currently at bedside  Disposition Plan:  Level of care: Progressive Status is: Inpatient Remains inpatient appropriate because: She is doing much better but will need to ensure that she can  maintain oral nutrition and remove her core track.  PT OT have now updated with recommendations for home health   Consultants:  [facility] Transfer Psychiatry   Procedures:  As delineated as above  Antimicrobials:  Anti-infectives (From admission, onward)    Start     Dose/Rate Route Frequency Ordered Stop   06/08/22 1500  fluconazole (DIFLUCAN) tablet 200 mg        200 mg Per Tube Daily 06/08/22 1402 06/14/22 1012   06/04/22 1045  sulfamethoxazole-trimethoprim (BACTRIM DS) 800-160 MG per tablet 2 tablet        2 tablet Oral Every 12 hours 06/04/22 0950 06/17/22 2012   06/01/22 1030  meropenem (MERREM) 2 g in sodium chloride 0.9 % 100 mL IVPB  Status:  Discontinued        2 g 280 mL/hr over 30 Minutes Intravenous Every 12 hours 06/01/22 0933 06/04/22 0950   05/27/22 1600  piperacillin-tazobactam (ZOSYN) IVPB 3.375 g  Status:  Discontinued        3.375 g 12.5 mL/hr over 240 Minutes Intravenous Every 8 hours 05/27/22 1018 06/01/22 0930   05/27/22 1130  vancomycin (VANCOREADY) IVPB 1250 mg/250 mL  Status:  Discontinued        1,250 mg 166.7 mL/hr over 90 Minutes Intravenous Every 24 hours 05/27/22 1036 05/27/22 1037   05/27/22 1130  vancomycin (VANCOREADY) IVPB 1250 mg/250 mL  Status:  Discontinued        1,250 mg 166.7 mL/hr over 90 Minutes Intravenous Every 48 hours 05/27/22 1037 05/28/22 0832   05/27/22 1115  vancomycin (VANCOCIN) IVPB 1000 mg/200 mL premix  Status:  Discontinued        1,000 mg 200 mL/hr over 60 Minutes Intravenous  Once 05/27/22 1018 05/27/22 1036   05/27/22 1115  piperacillin-tazobactam (ZOSYN) IVPB 3.375 g        3.375 g 100 mL/hr over 30 Minutes Intravenous  Once 05/27/22 1018 05/27/22 1119   05/20/22 2200  vancomycin (VANCOREADY) IVPB 750 mg/150 mL  Status:  Discontinued        750 mg 150 mL/hr over 60 Minutes Intravenous Every 24 hours 05/19/22 2158 05/20/22 1017   05/20/22 2000  vancomycin (VANCOCIN) IVPB 1000 mg/200 mL premix   Status:  Discontinued        1,000 mg 200 mL/hr over 60 Minutes Intravenous Every 24 hours 05/19/22 2107 05/19/22 2158   05/20/22 1115  cefTRIAXone (ROCEPHIN) 2 g in sodium chloride 0.9 % 100 mL IVPB        2 g 200 mL/hr over 30 Minutes Intravenous Every 24 hours 05/20/22 1017 05/25/22 1042   05/20/22 1000  ceFEPIme (MAXIPIME) 2 g in sodium chloride 0.9 % 100 mL IVPB  Status:  Discontinued        2 g 200 mL/hr over 30 Minutes Intravenous Every 12 hours 05/19/22 2107 05/20/22  1017   05/19/22 2200  vancomycin (VANCOREADY) IVPB 1250 mg/250 mL        1,250 mg 166.7 mL/hr over 90 Minutes Intravenous  Once 05/19/22 2158 05/20/22 0116   05/19/22 2100  ceFEPIme (MAXIPIME) 2 g in sodium chloride 0.9 % 100 mL IVPB        2 g 200 mL/hr over 30 Minutes Intravenous  Once 05/19/22 2048 05/19/22 2208   05/19/22 2100  vancomycin (VANCOREADY) IVPB 1500 mg/300 mL  Status:  Discontinued        1,500 mg 150 mL/hr over 120 Minutes Intravenous  Once 05/19/22 2048 05/19/22 2158       Subjective: Seen and examined at bedside and she states that she is still hurting quite a bit but thinks that she is doing otherwise okay.  Has been ambulating and states that her abdominal pain is improved.  She has no other concerns and is trying to eat as much as she can.  Objective: Vitals:   06/22/22 1145 06/22/22 1308 06/22/22 1559 06/22/22 1710  BP:  109/72 106/75 101/69  Pulse: 86 87 88 90  Resp: _0 Temp:  98.3 F (36.8 C)  98.5 F (36.9 C)  TempSrc:  Oral  Oral  SpO2: 100% 100% 100% 96%  Weight:      Height:       No intake or output data in the 24 hours ending 06/22/22 2009 Filed Weights   06/17/22 0455 06/18/22 0405 06/21/22 0307  Weight: 48.5 kg 48.2 kg 47.8 kg   Examination: Physical Exam:  Constitutional: Extremely thin and chronically ill-appearing Caucasian female in no acute distress Neck: Has a tracheostomy in place with a Passy-Muir valve connected to the ETT Respiratory: Diminished  to auscultation bilaterally with coarse breath sounds, no wheezing, rales, rhonchi or crackles. Normal respiratory effort and patient is not tachypenic. No accessory muscle use.  Unlabored breathing Cardiovascular: RRR, no murmurs / rubs / gallops. S1 and S2 auscultated. No extremity edema. Abdomen: Soft, non-tender, non-distended. Bowel sounds positive.  GU: Deferred. Musculoskeletal: No clubbing / cyanosis of digits/nails.  Normal strength and muscle tone.  Skin: No rashes, lesions, ulcers. No induration; Warm and dry.  Neurologic: CN 2-12 grossly intact with no focal deficits. Romberg sign and cerebellar reflexes not assessed.  Psychiatric: Normal judgment and insight. Alert and oriented x 3. Normal mood and appropriate affect.   Data Reviewed: I have personally reviewed following labs and imaging studies  CBC: Recent Labs  Lab 06/18/22 0603 06/19/22 0138 06/19/22 1152 06/20/22 0117 06/21/22 0116 06/22/22 0126  WBC 6.0 7.2  --  7.3 6.8 6.2  NEUTROABS 3.5 4.2  --  4.0 3.2 2.7  HGB 7.1* 6.7* 8.2* 8.0* 7.9* 8.0*  HCT 22.3* 20.8* 25.6* 24.9* 24.8* 25.4*  MCV 101.8* 102.0*  --  99.6 101.2* 102.8*  PLT 236 223  --  227 232 401   Basic Metabolic Panel: Recent Labs  Lab 06/18/22 0603 06/19/22 0138 06/20/22 0117 06/21/22 0116 06/22/22 0126  NA 134* 133* 136 137 136  K 4.9 4.9 4.7 4.1 3.9  CL 99 99 99 100 98  CO2 _1 GLUCOSE 91 103* 109* 123* 107*  BUN 38* 50* 57* 53* 47*  CREATININE 0.76 0.80 0.69 0.53 0.45  CALCIUM 9.6 9.4 9.5 9.5 9.4  MG 1.6* 2.2 1.9 1.8 2.0  PHOS 3.9 3.7 3.4 4.5 4.0   GFR: Estimated Creatinine Clearance: 62.8 mL/min (by C-G formula based on SCr  of 0.45 mg/dL). Liver Function Tests: Recent Labs  Lab 06/18/22 0603 06/19/22 0138 06/20/22 0117 06/21/22 0116 06/22/22 0126  AST _0 35 34  ALT _1 33  ALKPHOS 81 79 72 70 64  BILITOT 0.3 0.4 0.3 0.2* 0.3  PROT 6.0* 5.6* 5.7* 5.6* 5.5*  ALBUMIN 2.5* 2.3* 2.3* 2.4* 2.4*   No  results for input(s): "LIPASE", "AMYLASE" in the last 168 hours. No results for input(s): "AMMONIA" in the last 168 hours. Coagulation Profile: No results for input(s): "INR", "PROTIME" in the last 168 hours. Cardiac Enzymes: No results for input(s): "CKTOTAL", "CKMB", "CKMBINDEX", "TROPONINI" in the last 168 hours. BNP (last 3 results) No results for input(s): "PROBNP" in the last 8760 hours. HbA1C: No results for input(s): "HGBA1C" in the last 72 hours. CBG: Recent Labs  Lab 06/22/22 0010 06/22/22 0604 06/22/22 0815 06/22/22 1315 06/22/22 1722  GLUCAP 102* 96 93 136* 96   Lipid Profile: No results for input(s): "CHOL", "HDL", "LDLCALC", "TRIG", "CHOLHDL", "LDLDIRECT" in the last 72 hours. Thyroid Function Tests: No results for input(s): "TSH", "T4TOTAL", "FREET4", "T3FREE", "THYROIDAB" in the last 72 hours. Anemia Panel: No results for input(s): "VITAMINB12", "FOLATE", "FERRITIN", "TIBC", "IRON", "RETICCTPCT" in the last 72 hours. Sepsis Labs: No results for input(s): "PROCALCITON", "LATICACIDVEN" in the last 168 hours.  No results found for this or any previous visit (from the past 240 hour(s)).   Radiology Studies: DG CHEST PORT 1 VIEW  Result Date: 06/22/2022 CLINICAL DATA:  [id number] with shortness of breath. EXAM: PORTABLE CHEST 1 VIEW COMPARISON:  Portable chest 06/20/2022 FINDINGS: [id number].  Tracheostomy cannula has its tip 5.7 cm from the carina. Feeding tube enters the stomach with the intragastric course is not filmed. Patchy opacities are redemonstrated scattered across the left lung base, with again noted increased opacity in the right infrahilar area. There is no new, further or worsening lung opacity. No pleural effusion is seen. The cardiomediastinal silhouette and vascular pattern are normal. Thoracic cage is intact. IMPRESSION: 1. Stable patchy opacities in the left lung base and right infrahilar area. No new, further or worsening lung opacity. 2. Support apparatus  stable. Electronically Signed   By: Telford Nab M.D.   On: 06/22/2022 07:11     Scheduled Meds:  (feeding supplement) PROSource Plus  30 mL Oral BID BM   acetaminophen (TYLENOL) oral liquid 160 mg/5 mL  650 mg Per Tube QID   ascorbic acid  500 mg Per Tube BID   Chlorhexidine Gluconate Cloth  6 each Topical Daily   clonazePAM  0.5 mg Per Tube Daily   clonazePAM  2 mg Per Tube QHS   collagenase   Topical Daily   famotidine  20 mg Per Tube BID   feeding supplement  237 mL Oral TID WC   feeding supplement (VITAL AF 1.2 CAL)  1,000 mL Per Tube Q24H   fluticasone  1 spray Each Nare Daily   folic acid  1 mg Per Tube Daily   gabapentin  300 mg Per Tube BID   heparin  5,000 Units Subcutaneous Q8H   hydrocortisone   Rectal BID   lidocaine  1 patch Transdermal Q24H   magic mouthwash  5 mL Oral TID   metoprolol tartrate  12.5 mg Per Tube BID   nutrition supplement (JUVEN)  1 packet Per Tube BID BM   mouth rinse  15 mL Mouth Rinse 4 times per day   pantoprazole  40 mg Oral Daily   revefenacin  175 mcg Nebulization Daily   senna-docusate  1 tablet Oral QHS   thiamine  100 mg Per Tube Daily   valproic acid  500 mg Per Tube BID   vitamin A  10,000 Units Oral Daily   Continuous Infusions:  sodium chloride     sodium chloride Stopped (05/27/22 1640)    LOS: 34 days   Raiford Noble, DO [facility] Available via Epic secure chat 7am-7pm After these hours, please refer to coverage provider listed on [email or web address] 06/22/2022, 8:09 PM

## 2022-06-22 NOTE — Progress Notes (Addendum)
Calorie Count Note  Calorie count was ordered and extended over the weekend. Results days 3-5 below:  Diet: dysphagia 1 with thin liquids Supplements: Ensure Enlive po TID, PROSource Plus 30 mL BID  Dinner 11/17: unable to calculate as no meal tickets available Breakfast 11/18: unable to calculate as no meal tickets available Lunch 11/18: unable to calculate as no meal tickets available Supplements: 4 bottles of Ensure Enlive per pt report (1400 kcal, 80 grams protein)  Total intake day 3: 1400 kcal (80% of minimum estimated needs)  80 grams protein (89% of minimum estimated needs)  Dinner 11/18: unable to calculate as meal tickets was placed in envelope but it was not documented what part of meal pt ate Breakfast 11/19: N/A - no tray delivered Lunch 11/19: 100% puree peaches and puree pears (139 kcal, 0 grams of protein) Supplements: 4 bottles of Ensure Enlive per pt report (1400 kcal, 80 grams of protein)  Total intake day 4: 1539 kcal (88% of minimum estimated needs)  80 grams protein (89% of minimum estimated needs)  Dinner 11/19: no meal ticket collected but pt reports having pureed fruits from tray, which was puree apple cobbler and applesauce with sugar (272 kcal, 9 grams of protein) Breakfast 11/20: 100% chocolate ice cream, 100% chocolate pudding (220 kcal, 6 grams of protein) Lunch 11/20: 100% 2 servings puree peach, 50% vanilla Magic Cup, 100% chocolate ice cream (393 kcal, 7.5 grams) Supplements: 4 bottles of Ensure Enlive (1400 kcal, 80 grams of protein)  Total intake day 5: 2285 kcal (>100% of estimated needs)  102.5 grams protein (100% of estimated needs)  Estimated Nutritional Needs:  Kcal:  1750-1950  Protein:  90-105 grams Fluid:  >/= 1.8 L  Nutrition Dx: Severe Malnutrition related to social / environmental circumstances (poor PO intake related to poor dentition after taking lithium) as evidenced by severe fat depletion, severe muscle depletion.    Goal:  Patient will meet greater than or equal to 90% of their needs    Intervention:  -Plan is to continue calorie count another day per discussion with MD. -Continue nocturnal tube feeding via Cortrak:  -Vital AF 1.2 at 60 mL/hour x 12 hours overnight (1800-0600)   -Provides: 864 kcal, 54 grams of protein, 583 mL H2O daily  -Continue Ensure Enlive po TID, each supplement provides 350 kcal and 20 grams of protein. -Continue PROSource Plus po BID, each supplement provides 100 kcal and 15 grams of protein. -Provide Magic cup TID with meals, each supplement provides 290 kcal and 9 grams of protein. Patient requesting chocolate Magic Cup. -Continue Juven BID per tube, each packet provides 80 calories, 8 grams of carbohydrate, 2.5  grams of protein (collagen), 7 grams of L-arginine and 7 grams of L-glutamine; supplement contains CaHMB, Vitamins C, E, B12 and Zinc to promote wound healing. -Continue Vitamin C 500 mg BID per tube, folic acid 1 mg daily per tube, thiamine 100 mg daily per tube.  -Continue vitamin A 16109 units capsule daily by mouth. Discussed with SLP and okay for this to be provided by mouth without crushing as it cannot be crushed. She recommended offering extra bites of applesauce after vitamin A.  Letta Median, MS, RD, LDN, CNSC Pager number available on Amion

## 2022-06-23 LAB — GLUCOSE, CAPILLARY
Glucose-Capillary: 103 mg/dL — ABNORMAL HIGH (ref 70–99)
Glucose-Capillary: 120 mg/dL — ABNORMAL HIGH (ref 70–99)
Glucose-Capillary: 124 mg/dL — ABNORMAL HIGH (ref 70–99)
Glucose-Capillary: 83 mg/dL (ref 70–99)
Glucose-Capillary: 84 mg/dL (ref 70–99)
Glucose-Capillary: 88 mg/dL (ref 70–99)
Glucose-Capillary: 97 mg/dL (ref 70–99)

## 2022-06-23 LAB — CBC WITH DIFFERENTIAL/PLATELET
Abs Immature Granulocytes: 0.02 10*3/uL (ref 0.00–0.07)
Basophils Absolute: 0 10*3/uL (ref 0.0–0.1)
Basophils Relative: 1 %
Eosinophils Absolute: 0.1 10*3/uL (ref 0.0–0.5)
Eosinophils Relative: 2 %
HCT: 28.8 % — ABNORMAL LOW (ref 36.0–46.0)
Hemoglobin: 9.1 g/dL — ABNORMAL LOW (ref 12.0–15.0)
Immature Granulocytes: 1 %
Lymphocytes Relative: 36 %
Lymphs Abs: 1.6 10*3/uL (ref 0.7–4.0)
MCH: 32.2 pg (ref 26.0–34.0)
MCHC: 31.6 g/dL (ref 30.0–36.0)
MCV: 101.8 fL — ABNORMAL HIGH (ref 80.0–100.0)
Monocytes Absolute: 0.5 10*3/uL (ref 0.1–1.0)
Monocytes Relative: 10 %
Neutro Abs: 2.3 10*3/uL (ref 1.7–7.7)
Neutrophils Relative %: 50 %
Platelets: 260 10*3/uL (ref 150–400)
RBC: 2.83 MIL/uL — ABNORMAL LOW (ref 3.87–5.11)
RDW: 16.8 % — ABNORMAL HIGH (ref 11.5–15.5)
WBC: 4.4 10*3/uL (ref 4.0–10.5)
nRBC: 0 % (ref 0.0–0.2)

## 2022-06-23 LAB — MAGNESIUM: Magnesium: 1.9 mg/dL (ref 1.7–2.4)

## 2022-06-23 LAB — PHOSPHORUS: Phosphorus: 4.2 mg/dL (ref 2.5–4.6)

## 2022-06-23 LAB — COMPREHENSIVE METABOLIC PANEL
ALT: 26 U/L (ref 0–44)
AST: 30 U/L (ref 15–41)
Albumin: 2.6 g/dL — ABNORMAL LOW (ref 3.5–5.0)
Alkaline Phosphatase: 64 U/L (ref 38–126)
Anion gap: 7 (ref 5–15)
BUN: 33 mg/dL — ABNORMAL HIGH (ref 6–20)
CO2: 32 mmol/L (ref 22–32)
Calcium: 9.7 mg/dL (ref 8.9–10.3)
Chloride: 99 mmol/L (ref 98–111)
Creatinine, Ser: 0.48 mg/dL (ref 0.44–1.00)
GFR, Estimated: >60 mL/min (ref >60)
Glucose, Bld: 102 mg/dL — ABNORMAL HIGH (ref 70–99)
Potassium: 4 mmol/L (ref 3.5–5.1)
Sodium: 138 mmol/L (ref 135–145)
Total Bilirubin: 0.2 mg/dL — ABNORMAL LOW (ref 0.3–1.2)
Total Protein: 6.3 g/dL — ABNORMAL LOW (ref 6.5–8.1)

## 2022-06-23 MED ORDER — DOCUSATE SODIUM 100 MG PO CAPS: 100.0000 mg | ORAL_CAPSULE | Freq: Two times a day (BID) | ORAL | Status: DC | PRN

## 2022-06-23 MED ORDER — GUAIFENESIN 100 MG/5ML PO LIQD: 5.0000 mL | ORAL | Status: DC | PRN

## 2022-06-23 MED ORDER — HYDROMORPHONE HCL 2 MG PO TABS
2.0000 mg | ORAL_TABLET | ORAL | Status: DC | PRN
  Administered 2022-06-24 (×2): 4 mg via ORAL
  Filled 2022-06-23 (×2): qty 2

## 2022-06-23 MED ORDER — POLYETHYLENE GLYCOL 3350 17 G PO PACK: 17.0000 g | PACK | Freq: Every day | ORAL | Status: DC | PRN

## 2022-06-23 MED ORDER — JUVEN PO PACK
1.0000 | PACK | Freq: Two times a day (BID) | ORAL | Status: DC
  Administered 2022-06-24 – 2022-06-25 (×4): 1 via ORAL
  Filled 2022-06-23 (×3): qty 1

## 2022-06-23 MED ORDER — FOLIC ACID 1 MG PO TABS
1.0000 mg | ORAL_TABLET | Freq: Every day | ORAL | Status: DC
  Administered 2022-06-24 – 2022-06-25 (×2): 1 mg via ORAL
  Filled 2022-06-23 (×2): qty 1

## 2022-06-23 MED ORDER — CLONAZEPAM 0.5 MG PO TABS
2.0000 mg | ORAL_TABLET | Freq: Every day | ORAL | Status: DC
  Administered 2022-06-23 – 2022-06-24 (×2): 2 mg via ORAL
  Filled 2022-06-23 (×2): qty 4

## 2022-06-23 MED ORDER — VITAMIN C 500 MG PO TABS
500.0000 mg | ORAL_TABLET | Freq: Two times a day (BID) | ORAL | Status: DC
  Administered 2022-06-23 – 2022-06-25 (×4): 500 mg via ORAL
  Filled 2022-06-23 (×4): qty 1

## 2022-06-23 MED ORDER — CLONAZEPAM 0.5 MG PO TABS
1.0000 mg | ORAL_TABLET | Freq: Two times a day (BID) | ORAL | Status: DC | PRN
  Administered 2022-06-24 – 2022-06-25 (×2): 1 mg via ORAL
  Filled 2022-06-23 (×2): qty 2

## 2022-06-23 MED ORDER — FAMOTIDINE 20 MG PO TABS
20.0000 mg | ORAL_TABLET | Freq: Two times a day (BID) | ORAL | Status: DC
  Administered 2022-06-23 – 2022-06-24 (×3): 20 mg via ORAL
  Filled 2022-06-23 (×3): qty 1

## 2022-06-23 MED ORDER — ACETAMINOPHEN 325 MG PO TABS
650.0000 mg | ORAL_TABLET | Freq: Four times a day (QID) | ORAL | Status: DC
  Administered 2022-06-23 – 2022-06-25 (×8): 650 mg via ORAL
  Filled 2022-06-23 (×8): qty 2

## 2022-06-23 MED ORDER — VALPROIC ACID 250 MG PO CAPS
500.0000 mg | ORAL_CAPSULE | Freq: Two times a day (BID) | ORAL | Status: DC
  Administered 2022-06-23 – 2022-06-25 (×4): 500 mg via ORAL
  Filled 2022-06-23 (×5): qty 2

## 2022-06-23 MED ORDER — THIAMINE MONONITRATE 100 MG PO TABS
100.0000 mg | ORAL_TABLET | Freq: Every day | ORAL | Status: DC
  Administered 2022-06-24 – 2022-06-25 (×2): 100 mg via ORAL
  Filled 2022-06-23 (×2): qty 1

## 2022-06-23 MED ORDER — METOPROLOL TARTRATE 12.5 MG HALF TABLET
12.5000 mg | ORAL_TABLET | Freq: Two times a day (BID) | ORAL | Status: DC
  Administered 2022-06-23: 12.5 mg via ORAL
  Filled 2022-06-23 (×4): qty 1

## 2022-06-23 MED ORDER — CLONAZEPAM 0.5 MG PO TABS
0.5000 mg | ORAL_TABLET | Freq: Every day | ORAL | Status: DC
  Administered 2022-06-24 – 2022-06-25 (×2): 0.5 mg via ORAL
  Filled 2022-06-23 (×2): qty 1

## 2022-06-23 MED ORDER — GABAPENTIN 300 MG PO CAPS
300.0000 mg | ORAL_CAPSULE | Freq: Two times a day (BID) | ORAL | Status: DC
  Administered 2022-06-23 – 2022-06-25 (×4): 300 mg via ORAL
  Filled 2022-06-23 (×4): qty 1

## 2022-06-23 MED ORDER — ENSURE ENLIVE PO LIQD
237.0000 mL | Freq: Four times a day (QID) | ORAL | Status: DC
  Administered 2022-06-23 – 2022-06-25 (×9): 237 mL via ORAL

## 2022-06-23 NOTE — Progress Notes (Signed)
Inpatient Rehab Admissions Coordinator:   Recommendations for patient have been updated to home with HHPT/OT. Will sign off at this time.   Rehab Admissons Coordinator [place], [place], [place] [phone]

## 2022-06-23 NOTE — Progress Notes (Signed)
PROGRESS NOTE    Monica Hopkins  OJJ:[id number] DOB: 11/13/1970 DOA: 05/19/2022 PCP: Leonard Downing, MD   Brief Narrative:  The patient is a 51 year old presented after cardiac arrest.  Found to be hypothermic and intubated in the emergency department. Significant Events 10/17: admitted post cardiac arrest; ROSC 26 minutes 10/20: off pressors, will awaken agitated, desaturates SBT.  10/23 failed extubation trial 10/25 trach 06/04/2022 she was transferred to the [facility] service 06/04/22 she desaturated on trach collar and PCCM was reconsulted. 06/05/22.  Continues to fail at swallowing and will need alternate means of feeding and she is resistant to PEG tube.  Consulted palliative care for further goals of care discussion and will also need a psychiatric evaluation 06/06/22. rapid response called given desaturation due to L mucous plugging and small effusion 06/07/22. Transferred to ICU after recurrent saturation.  She was refusing chest vest, metaneb.   06/08/22. Tolerating trach collar, accepting meds, c/o pain 06/09/22  IR consulted for PEG, unable to secondary to anatomy.  General surgery recommending conservative management, core track, diet as tolerated. 11/10 started on liquid diet.  Did poorly the first day, was noncompliant. 11/11 n.p.o. given poor compliance with proper diet, respiratory status decline, made NPO 11/12-13 tolerating liquid diet  06/17/2022.  She is tolerating full liquids and will have SLP reevaluate.  We will do a calorie count and see if the patient can tolerate a soft diet Patient is now on a dysphagia 1 diet and doing a calorie count and we will give her another day to see if she tolerates her meals okay.  If she does we can remove her core track tube and PT OT have now changed the recommendations to home with home health given that she is doing much better from mobility standpoint and ambulated 550 feet.  We will continue the Isharani p.o. 3 times daily and Prosource  +30 mL twice daily 11/21-tolerated her calorie count and able to maintain her nutrition orally so we will discontinue cortrak.  PT OT now recommending home health still and CIR has signed off the case.  Patient ambulating fairly well but continues to complain of significant pain.  Have consulted palliative for assistance with pain control given the amount of pain that she is having after her cardiac arrest  Assessment and Plan:  S/p cardiac arrest found to be in PEA/shockable rhythm, in the setting of hypoxia due to pneumococcal pneumonia    Acute hypoxic respiratory failure due to Strep pneumonia pneumonia, complicated with aspiration and healthcare associated pneumonia with Enterobacter, Klebsiella and stenotrophomonas  --S/p trach, Intermittent mucous plugging. On trach collar. Poor cough mechanics, deconditioned. Anxious and with rib pain  --continue nebs --10/23 BAL with klebsiella and enterobacter cloacae, 10/28 tracheal aspirate with enterobacter cloacae and stenotrophomonas, 11/4 tracheal aspirate with rare normal resp flora. C/w Bactrim course to completion -Continue with hypertonic saline nebs and breathing treatments with revefenacin 175 mcg daily -SpO2: 97 % O2 Flow Rate (L/min): 5 L/min FiO2 (%): 21 % --SLP working with Passy-Muir valve and having a repeat Evaluation for Diet advancement and now on a D1 Diet  -CXR done to be repeated tomorrow but last chest x-ray showed stable patchy opacities in the left lung base and right infrahilar area. No new, further or worsening lung opacity. Support apparatus stable."   New diagnosis of acute HFpEF  --echo with EF 55%, no RWMA, grade 1 diastolic dysfunction, d shaped septum suggesting RV pressure/volume overload -- She is + 16.751 L however this  is unlikely accurate -Continuing metoprolol tartrate 12.5 mg p.o. twice daily and if needed we will give her a dose of Lasix but she received boluses for Hypotenstion    Hypotension, improving and  stable -Asymptomatic -MAP on the lower side  -Given a 250 mL bolus yesterday and blood pressure remains low and blood count is dropping so she stopped it.  Transfuse 1 unit of blood on 06/19/22 -Order TED Hose -Continue to Monitor and Trend given that maps run on the low side but she is a tiny lady   Hypomagnesemia -Mild. Mag Level is now 1.9 -Continue to Monitor and Trend -Repeat Mag Level in the AM    Severe protein calorie malnutrition, -Cortrak is now removed -Nutrition Status: Nutrition Problem: Severe Malnutrition Etiology: social / environmental circumstances (poor PO intake related to poor dentition after taking lithium) Signs/Symptoms: severe fat depletion, severe muscle depletion Interventions: Refer to RD note for recommendations -She is tolerating her calorie count and does not need a PEG tube   Acute Septic/Toxic Encephalopathy -Resolved   Acute Rhabdomyolysis, resolved Hyperkalemia/hypokalemia, resolved AKI due to septic ATN, rhabdomyolysis, resolved Hypernatremia -> Hyponatremia  -Creatinine had been stable and patient's BUNs/creatinine is now improved and is 33/0.48 -Potassium is now 4.0, magnesium is 1.9, and phosphorus level is now 4.0 -Avoid further nephrotoxic medications, contrast dyes, hypotension and dehydration -Repeat CMP in the a.m.   Anxiety and Depression  Bipolar  -Psychiatry recommendations from 11/4 --Seroquel 50 mg TID prn for agitation only but this was scheduled and now discontinued, clonazepam 0.5 mg in the AM, 2 mg qhs, 0.5 mg BID prn.  Continued depakote 250 mg BID (this was later increased 500 mg BID).  Trazodone 50 mg qhs prn.   Rib fx from CPR but she also has a history of chronic pain -C/w Tylenol, Dilaudid prn, Gabapentin -She was on Suboxone previously but lost her insurance and was getting it via alternative means -We will adjust and add oxycodone and start IV morphine as needed; will increase the oxycodone dose to 5 to 10 mg given  her uncontrolled pain -Given her uncontrolled pain we have consulted palliative care for assistance   Hyponatremia -Mild. Patient's Na+ has improved and is now 138 -Continue to Monitor and Trend and getting IVF Boluses but will hold today given that she is getting blood -Repeat CMP in the AM    Acute blood loss anemia on anemia of chronic disease/Macrocytic Anemia  -Patient hemoglobin had stabilized but had dropped acutely for unclear etiology and reasons for possibly secondary to boluses.  There is concern for upper GI bleeding but this appears less likely -Hemoglobin/hematocrit is now stabilized and is now 9.1/28.8 with MCV of 101.8; she is status post 2 units of PRBCs -We will check FOBT and question if this is related to an upper GI bleeding given that her BUN is dissociated from her creatinine and FOBT is still pending -Patient does have a history of a duodenal ulcer and may need further GI evaluation but currently she appears stable GI bleeding is less likely -Iron panel suggestive of chronic disease -Continue monitor for signs symptoms of bleeding; no overt bleeding noted -We have stopped her Toradol given her worsening BUN and possible upper GI bleeding but patient denies any dark stools as of now -Repeat CBC in the AM    Diarrhea, improving -Likely from TF -Continue to Monitor for Infectious Diarrhea and she has no white count or fever.   Thrombocytosis -Improved and now resolved platelet count  is now 260 -Continue monitor trend and repeat CBC in a.m.   Hypoalbuminemia -Patient's albumin level is now 2.6 -Continue To monitor trend and repeat CMP in a.m.  DVT prophylaxis: heparin injection 5,000 Units Start: 05/19/22 2200 SCDs Start: 05/19/22 2151    Code Status: Full Code Family Communication: No family currently at bedside  Disposition Plan:  Level of care: Progressive Status is: Inpatient Remains inpatient appropriate because: She is improving slowly and PT OT now  recommending home health.  Cortrak is now out and will need to work on getting her pain better under control.  We will need to reevaluate with pulmonary about her tracheostomy and care   Consultants:  [facility] Transfer Psychiatry   Procedures:  As delineated as above  Antimicrobials:  Anti-infectives (From admission, onward)    Start     Dose/Rate Route Frequency Ordered Stop   06/08/22 1500  fluconazole (DIFLUCAN) tablet 200 mg        200 mg Per Tube Daily 06/08/22 1402 06/14/22 1012   06/04/22 1045  sulfamethoxazole-trimethoprim (BACTRIM DS) 800-160 MG per tablet 2 tablet        2 tablet Oral Every 12 hours 06/04/22 0950 06/17/22 2012   06/01/22 1030  meropenem (MERREM) 2 g in sodium chloride 0.9 % 100 mL IVPB  Status:  Discontinued        2 g 280 mL/hr over 30 Minutes Intravenous Every 12 hours 06/01/22 0933 06/04/22 0950   05/27/22 1600  piperacillin-tazobactam (ZOSYN) IVPB 3.375 g  Status:  Discontinued        3.375 g 12.5 mL/hr over 240 Minutes Intravenous Every 8 hours 05/27/22 1018 06/01/22 0930   05/27/22 1130  vancomycin (VANCOREADY) IVPB 1250 mg/250 mL  Status:  Discontinued        1,250 mg 166.7 mL/hr over 90 Minutes Intravenous Every 24 hours 05/27/22 1036 05/27/22 1037   05/27/22 1130  vancomycin (VANCOREADY) IVPB 1250 mg/250 mL  Status:  Discontinued        1,250 mg 166.7 mL/hr over 90 Minutes Intravenous Every 48 hours 05/27/22 1037 05/28/22 0832   05/27/22 1115  vancomycin (VANCOCIN) IVPB 1000 mg/200 mL premix  Status:  Discontinued        1,000 mg 200 mL/hr over 60 Minutes Intravenous  Once 05/27/22 1018 05/27/22 1036   05/27/22 1115  piperacillin-tazobactam (ZOSYN) IVPB 3.375 g        3.375 g 100 mL/hr over 30 Minutes Intravenous  Once 05/27/22 1018 05/27/22 1119   05/20/22 2200  vancomycin (VANCOREADY) IVPB 750 mg/150 mL  Status:  Discontinued        750 mg 150 mL/hr over 60 Minutes Intravenous Every 24 hours 05/19/22 2158 05/20/22  1017   05/20/22 2000  vancomycin (VANCOCIN) IVPB 1000 mg/200 mL premix  Status:  Discontinued        1,000 mg 200 mL/hr over 60 Minutes Intravenous Every 24 hours 05/19/22 2107 05/19/22 2158   05/20/22 1115  cefTRIAXone (ROCEPHIN) 2 g in sodium chloride 0.9 % 100 mL IVPB        2 g 200 mL/hr over 30 Minutes Intravenous Every 24 hours 05/20/22 1017 05/25/22 1042   05/20/22 1000  ceFEPIme (MAXIPIME) 2 g in sodium chloride 0.9 % 100 mL IVPB  Status:  Discontinued        2 g 200 mL/hr over 30 Minutes Intravenous Every 12 hours 05/19/22 2107 05/20/22 1017   05/19/22 2200  vancomycin (VANCOREADY) IVPB 1250 mg/250 mL  1,250 mg 166.7 mL/hr over 90 Minutes Intravenous  Once 05/19/22 2158 05/20/22 0116   05/19/22 2100  ceFEPIme (MAXIPIME) 2 g in sodium chloride 0.9 % 100 mL IVPB        2 g 200 mL/hr over 30 Minutes Intravenous  Once 05/19/22 2048 05/19/22 2208   05/19/22 2100  vancomycin (VANCOREADY) IVPB 1500 mg/300 mL  Status:  Discontinued        1,500 mg 150 mL/hr over 120 Minutes Intravenous  Once 05/19/22 2048 05/19/22 2158       Subjective: Seen and examined at bedside and states that she continues to have significant pain and that the oxycodone is not really helping her for the breakthrough pain.  Has been ambulating very well but states that she has been restless and not getting very much sleep throughout the night.  Tolerating her meals without issues and nausea and diarrhea is improved.  She has no other concerns or complaints at this time.  Objective: Vitals:   06/23/22 1200 06/23/22 1211 06/23/22 1300 06/23/22 1525  BP: 99/72 113/86 108/79   Pulse: 92 83 81 (!) 119  Resp: _0 Temp:  98 F (36.7 C)    TempSrc:  Oral    SpO2: 92% 95% 97% 97%  Weight:      Height:        Intake/Output Summary (Last 24 hours) at 06/23/2022 1643 Last data filed at 06/23/2022 1310 Gross per 24 hour  Intake 480 ml  Output 200 ml  Net 280 ml   Filed Weights   06/17/22 0455  06/18/22 0405 06/21/22 0307  Weight: 48.5 kg 48.2 kg 47.8 kg   Examination: Physical Exam:  Constitutional: Thin Caucasian female currently no acute distress Respiratory: Diminished to auscultation bilaterally with coarse breath sounds, no wheezing, rales, rhonchi or crackles. Normal respiratory effort and patient is not tachypenic. No accessory muscle use.  Wearing a ATC connected to her tracheostomy Cardiovascular: RRR, no murmurs / rubs / gallops. S1 and S2 auscultated. No extremity edema. Abdomen: Soft, non-tender, non-distended. Bowel sounds positive.  GU: Deferred. Musculoskeletal: No clubbing / cyanosis of digits/nails. No joint deformity upper and lower extremities.  Skin: No rashes, lesions, ulcers on limited skin evaluation. No induration; Warm and dry.  Neurologic: CN 2-12 grossly intact with no focal deficits. Romberg sign cerebellar reflexes not assessed.  Psychiatric: Normal judgment and insight. Alert and oriented x 3. Normal mood and appropriate affect.   Data Reviewed: I have personally reviewed following labs and imaging studies  CBC: Recent Labs  Lab 06/19/22 0138 06/19/22 1152 06/20/22 0117 06/21/22 0116 06/22/22 0126 06/23/22 0827  WBC 7.2  --  7.3 6.8 6.2 4.4  NEUTROABS 4.2  --  4.0 3.2 2.7 2.3  HGB 6.7* 8.2* 8.0* 7.9* 8.0* 9.1*  HCT 20.8* 25.6* 24.9* 24.8* 25.4* 28.8*  MCV 102.0*  --  99.6 101.2* 102.8* 101.8*  PLT 223  --  227 232 227 423   Basic Metabolic Panel: Recent Labs  Lab 06/19/22 0138 06/20/22 0117 06/21/22 0116 06/22/22 0126 06/23/22 0827  NA 133* 136 137 136 138  K 4.9 4.7 4.1 3.9 4.0  CL 99 99 100 98 99  CO2 _1 32  GLUCOSE 103* 109* 123* 107* 102*  BUN 50* 57* 53* 47* 33*  CREATININE 0.80 0.69 0.53 0.45 0.48  CALCIUM 9.4 9.5 9.5 9.4 9.7  MG 2.2 1.9 1.8 2.0 1.9  PHOS 3.7 3.4 4.5 4.0 4.2   GFR:  Estimated Creatinine Clearance: 62.8 mL/min (by C-G formula based on SCr of 0.48 mg/dL). Liver Function Tests: Recent Labs   Lab 06/19/22 0138 06/20/22 0117 06/21/22 0116 06/22/22 0126 06/23/22 0827  AST 20 22 35 34 30  ALT _0 33 26  ALKPHOS 79 72 70 64 64  BILITOT 0.4 0.3 0.2* 0.3 0.2*  PROT 5.6* 5.7* 5.6* 5.5* 6.3*  ALBUMIN 2.3* 2.3* 2.4* 2.4* 2.6*   No results for input(s): "LIPASE", "AMYLASE" in the last 168 hours. No results for input(s): "AMMONIA" in the last 168 hours. Coagulation Profile: No results for input(s): "INR", "PROTIME" in the last 168 hours. Cardiac Enzymes: No results for input(s): "CKTOTAL", "CKMB", "CKMBINDEX", "TROPONINI" in the last 168 hours. BNP (last 3 results) No results for input(s): "PROBNP" in the last 8760 hours. HbA1C: No results for input(s): "HGBA1C" in the last 72 hours. CBG: Recent Labs  Lab 06/22/22 2008 06/23/22 0023 06/23/22 0444 06/23/22 0750 06/23/22 1251  GLUCAP 128* 120* 88 97 84   Lipid Profile: No results for input(s): "CHOL", "HDL", "LDLCALC", "TRIG", "CHOLHDL", "LDLDIRECT" in the last 72 hours. Thyroid Function Tests: No results for input(s): "TSH", "T4TOTAL", "FREET4", "T3FREE", "THYROIDAB" in the last 72 hours. Anemia Panel: No results for input(s): "VITAMINB12", "FOLATE", "FERRITIN", "TIBC", "IRON", "RETICCTPCT" in the last 72 hours. Sepsis Labs: No results for input(s): "PROCALCITON", "LATICACIDVEN" in the last 168 hours.  No results found for this or any previous visit (from the past 240 hour(s)).   Radiology Studies: DG CHEST PORT 1 VIEW  Result Date: 06/22/2022 CLINICAL DATA:  [id number] with shortness of breath. EXAM: PORTABLE CHEST 1 VIEW COMPARISON:  Portable chest 06/20/2022 FINDINGS: [id number].  Tracheostomy cannula has its tip 5.7 cm from the carina. Feeding tube enters the stomach with the intragastric course is not filmed. Patchy opacities are redemonstrated scattered across the left lung base, with again noted increased opacity in the right infrahilar area. There is no new, further or worsening lung opacity. No pleural effusion  is seen. The cardiomediastinal silhouette and vascular pattern are normal. Thoracic cage is intact. IMPRESSION: 1. Stable patchy opacities in the left lung base and right infrahilar area. No new, further or worsening lung opacity. 2. Support apparatus stable. Electronically Signed   By: Telford Nab M.D.   On: 06/22/2022 07:11    Scheduled Meds:  acetaminophen  650 mg Oral QID   ascorbic acid  500 mg Oral BID   Chlorhexidine Gluconate Cloth  6 each Topical Daily   [START ON 06/24/2022] clonazePAM  0.5 mg Oral Daily   clonazePAM  2 mg Oral QHS   collagenase   Topical Daily   famotidine  20 mg Oral BID   feeding supplement  237 mL Oral QID   fluticasone  1 spray Each Nare Daily   [START ON 73/42/8768] folic acid  1 mg Oral Daily   gabapentin  300 mg Oral BID   heparin  5,000 Units Subcutaneous Q8H   hydrocortisone   Rectal BID   lidocaine  1 patch Transdermal Q24H   magic mouthwash  5 mL Oral TID   metoprolol tartrate  12.5 mg Oral BID   [START ON 06/24/2022] nutrition supplement (JUVEN)  1 packet Oral BID BM   mouth rinse  15 mL Mouth Rinse 4 times per day   pantoprazole  40 mg Oral Daily   revefenacin  175 mcg Nebulization Daily   senna-docusate  1 tablet Oral QHS   [START ON 06/24/2022] thiamine  100 mg Oral  Daily   valproic acid  500 mg Oral BID   vitamin A  10,000 Units Oral Daily   Continuous Infusions:  sodium chloride     sodium chloride Stopped (05/27/22 1640)    LOS: 35 days   Raiford Noble, DO [facility] Available via Epic secure chat 7am-7pm After these hours, please refer to coverage provider listed on [email or web address] 06/23/2022, 4:43 PM

## 2022-06-23 NOTE — Progress Notes (Signed)
Nutrition Follow-up  DOCUMENTATION CODES:   Severe malnutrition in context of social or environmental circumstances, Underweight  INTERVENTION:  Will discontinue calorie count at this time.  Plan is to discontinue tube feeds and remove Cortrak tube today.  Provide Ensure Enlive po QID, each supplement provides 350 kcal and 20 grams of protein.  Discontinued PROSource Plus as pt not able to tolerate.  Provide Juven BID PO, each packet provides 80 calories, 8 grams of carbohydrate, 2.5  grams of protein (collagen), 7 grams of L-arginine and 7 grams of L-glutamine; supplement contains CaHMB, Vitamins C, E, B12 and Zinc to promote wound healing.   Provide vitamin C 528 mg BID PO, folic acid 1 mg daily PO, thiamine 100 mg daily PO, vitamin A 10000 units capsule daily PO.  NUTRITION DIAGNOSIS:   Severe Malnutrition related to social / environmental circumstances (poor PO intake related to poor dentition after taking lithium) as evidenced by severe fat depletion, severe muscle depletion.  Ongoing.  GOAL:   Patient will meet greater than or equal to 90% of their needs  Met with interventions.  MONITOR:   Vent status, Labs, Weight trends, TF tolerance, Skin, I & O's  REASON FOR ASSESSMENT:   Ventilator    ASSESSMENT:   51 year old female with PMHx of bipolar disorder, poor dentition, unexplained wt loss admitted after cardiac arrest. Found unconscious by husband who started CPR immediately. Intubated in ED on 05/19/22.  10/17 Admitted post arrest 10/18 Cortrak placed, TF initiated at 15 ml/hr 10/19 TF increased to 25 ml/hr 10/20 Off pressors 10/23 Failed extubation  10/25 Trach 11/05 Transferred back to ICU 11/08 Transferred back to 4E 11/10 s/p MBS and diet advanced to full liquids 11/17 s/p repeat MBS and diet advanced to dysphagia 1 with thin liquids 11/21 discontinuing tube feeds and removing Cortrak tube  Met with patient at bedside. She is currently on trach  collar 5 L/min. She has been tolerating dysphagia 1 diet with thin liquids well. She has been drinking 4-5 bottles of Ensure daily and also enjoys them mixed with ice cream for additional calories. She does not like the PROSource Plus as she reports it is too acidic and hurts her teeth. She has not yet tried Juven by mouth. Will change Juven and vitamin supplements to all be given by mouth. Patient reports she is motivated to continue Ensure intake during hospital stay and at home after discharge. She plans to purchase out of pocket. Discussed that once Medicaid obtained, PCP can write prescription to DME to see if it can be covered (such as [facility]).  Pt was documented to be 50 kg on 05/19/22. Current wt is 47.8 kg. Suspect many fluctuations inpatient were related to measuring weight on different scales and fluid status. Will continue to monitor trend as pt has been eating more by mouth.  Enteral Access: 10 Fr. Cortrak tube placed 10/18, 76 cm at left nare with bridle in place; terminates in duodenal bulb per CT abd/pelvis 11/8   Tube Feed Regimen: Vital AF 1.2 at 60 mL/hour x 12 hours overnight  UOP: 200 mL + 2 occurrences unmeasured UOP  I/O: +16751.7 mL since admission (suspect not accurate as exact UOP not being measured)  Medications reviewed and include: vitamin C 500 mg BID, famotidine, Ensure Enlive, folic acid 1 mg daily, Lopressor, Juven BID, pantoprazole, senna-docusate, thiamine 100 mg daily, valproic acid, vitamin A capsule  Labs reviewed: CBG 84-128  Micronutrient Labs: CRP: 23.2 (H) Vitamin B12: 4289 (H) Folate  B9: 6.0 (low normal)-continue supplementation Vitamin A: 3.7 (L) Vitamin C: <0.01 (L) Copper: 222 (wdl) Zinc: 46 (wdl)  Discussed with MD via secure chat. Plan is to discontinue tube feeds and remove Cortrak tube.  Diet Order:   Diet Order             DIET - DYS 1 Room service appropriate? Yes with Assist; Fluid consistency: Thin  Diet effective now                   EDUCATION NEEDS:   Not appropriate for education at this time  Skin:  Skin Assessment: Skin Integrity Issues: Skin Integrity Issues:: DTI, Stage II, Stage III DTI: left lateral heel (2cm x 2cm); right lateral heel (1cm x 2cm) Stage II: sacrum (7cm x 10cm), vertebral column (3cm x 1cm), lower vertebral column (1cm x 1cm) Stage III: upper vertebral column (1cm x 1cm)  Last BM:  06/23/22 - smear  Height:   Ht Readings from Last 1 Encounters:  05/19/22 _0  (1.676 m)    Weight:   Wt Readings from Last 1 Encounters:  06/21/22 47.8 kg   Ideal Body Weight:  59.1 kg  BMI:  Body mass index is 17.01 kg/m.  Estimated Nutritional Needs:   Kcal:  1750-1950 kcals  Protein:  90-105 g  Fluid:  >/= 1.8 L  Alby Schwabe Magda Paganini, MS, RD, LDN, CNSC Pager number available on Amion

## 2022-06-23 NOTE — Progress Notes (Signed)
Calorie Count Note  Calorie count was ordered and extended over the weekend. Results from day 6 below:  Diet: dysphagia 1 with thin liquids Supplements: Ensure Enlive po TID, PROSource Plus 30 mL BID  Dinner 11/20: 100% ice cream and chocolate pudding (220 kcal, 6 grams protein) Breakfast 11/21: 100% 2 ice cream, chocolate pudding (350 kcal, 8 grams protein) Lunch 11/21: 1 ice cream (130 kcal, 2 grams protein) Supplements: 5 bottles of Ensure Enlive (1750 kcal, 100 grams protein)  Total intake day 6: 2450 kcal (>100% of estimated needs)  116 protein (>100% of estimated needs)  Estimated Nutritional Needs:  Kcal:  1750-1950  Protein:  90-105 grams Fluid:  >/= 1.8 L  Nutrition Dx: Severe Malnutrition related to social / environmental circumstances (poor PO intake related to poor dentition after taking lithium) as evidenced by severe fat depletion, severe muscle depletion.     Goal: Patient will meet greater than or equal to 90% of their needs   Intervention:  See follow up RD assessment from today (11/21) for updated nutrition intervention.  Letta Median, MS, RD, LDN, CNSC Pager number available on Amion

## 2022-06-23 NOTE — Progress Notes (Signed)
Cortrak removed per dietician and MD. No complications. This RN called the unit pharmacist to have all medications changed to oral route.  Monica Hopkins

## 2022-06-23 NOTE — Progress Notes (Signed)
Mobility Specialist Progress Note   06/23/22 1500  Pain Assessment  Faces Pain Scale 6  Pain Location buttocks (bottom)  Pain Descriptors / Indicators Discomfort;Grimacing;Crying  Pain Intervention(s) Patient requesting pain meds-RN notified  Mobility  Activity Ambulated with assistance to bathroom;Ambulated with assistance in room  Level of Assistance Contact guard assist, steadying assist  [place] wheel walker  Distance Ambulated (ft) 25 ft  Range of Motion/Exercises Active;All extremities  Activity Response Tolerated fair;RN notified   Patient received in supine, deferred hallway ambulation second nausea and pain. Requested assistance to restroom. Ambulated supervision level to bathroom for BM. Patient crying and grimacing throughout, requested pain medications. Was left in restroom with all needs met, RN notified.  Monica Hopkins, BS EXP Mobility Specialist Please contact via SecureChat or Rehab office at [phone]

## 2022-06-23 NOTE — Progress Notes (Signed)
Physical Therapy Treatment Patient Details Name: Monica Hopkins MRN: [id number] DOB: Dec 06, 1970 Today's Date: 06/23/2022   History of Present Illness Pt is a 51 y.o. female admitted 05/19/22 with cardiac arrest at home, ROSC 26 min, hypothermic, rib fx from CPR. Intubated 10/17, failed extubation 10/23; trach placed 10/25. Pt with new HFpEF. Rapid response 11/4 due to recurrent desaturation with L mucous plugging and small effusion; transfer back to ICU 11/5-11/8. IR consulted for PEG 11/7. PMH includes bipolar disorder.    PT Comments    Pt reports plan is to now d/c home with her daughter and son-in-law in a ground floor apartment with maybe x2 STE. Pt continues to make good progress with mobility as she was able to navigate x3 stairs with bil hands on x1 handrail at a min guard-minA level today. She does demonstrate lower extremity strength and balance deficits that impact her ease and safety with it though. Pt able to demonstrate good recall and carryover of education on rollator brake utilization with transfers. Considering pt's good progress with mobility, PT goals were upgraded and updated. Will continue to follow acutely. Current recommendations remain appropriate.      Recommendations for follow up therapy are one component of a multi-disciplinary discharge planning process, led by the attending physician.  Recommendations may be updated based on patient status, additional functional criteria and insurance authorization.  Follow Up Recommendations  Home health PT Can patient physically be transported by private vehicle: Yes   Assistance Recommended at Discharge Intermittent Supervision/Assistance  Patient can return home with the following Help with stairs or ramp for entrance;Assist for transportation;Assistance with cooking/housework;A little help with walking and/or transfers;A little help with bathing/dressing/bathroom   Equipment Recommendations  Rollator (4 wheels);BSC/3in1     Recommendations for Other Services       Precautions / Restrictions Precautions Precautions: Fall;Other (comment) Precaution Comments: cortrak, trach collar, multiple wounds along spine and sacrum, watch BP and SpO2 Restrictions Weight Bearing Restrictions: No     Mobility  Bed Mobility Overal bed mobility: Needs Assistance Bed Mobility: Sit to Supine, Supine to Sit     Supine to sit: Min guard, HOB elevated Sit to supine: HOB elevated, Min guard   General bed mobility comments: Min guard assist for safety, extra time to complete with HOB elevated and use of bed rail    Transfers Overall transfer level: Needs assistance Equipment used: Rollator (4 wheels) Transfers: Sit to/from Stand Sit to Stand: Min guard           General transfer comment: Min guard assist to come to stand from bed 1x and from rollator seat 1x, cues provided to lock brakes prior to transfers with good carryover noted.    Ambulation/Gait Ambulation/Gait assistance: Min guard Gait Distance (Feet): 280 Feet Assistive device: Rollator (4 wheels) Gait Pattern/deviations: Step-through pattern, Decreased stride length, Trunk flexed, Narrow base of support, Knee flexed in stance - left Gait velocity: decreased Gait velocity interpretation: <1.31 ft/sec, indicative of household ambulator   General Gait Details: Pt ambulating at slow but steady pace without LOB. Maintains a slightly flexed posture due to kyphotic posture. Pt with narrow BOS, needing cues to widen stance at times. Min guard assist for safety   Stairs Stairs: Yes Stairs assistance: Min guard, Min assist Stair Management: One rail Right, One rail Left, Step to pattern, Forwards, Sideways Number of Stairs: 3 General stair comments: Ascends with bil hands on L rail and descends with bil hands on R rail. Pt navigates stairs with  her trunk slightly rotated sideways towards the rail for support. Extra time and effort noted to complete. Light  minA intermittently provided to ensure stability, but otherwise primarily only required min guard assist for safety   Wheelchair Mobility    Modified Rankin (Stroke Patients Only)       Balance Overall balance assessment: Needs assistance Sitting-balance support: Feet supported Sitting balance-Leahy Scale: Fair Sitting balance - Comments: EOB with supervision   Standing balance support: Bilateral upper extremity supported, During functional activity, No upper extremity supported, Single extremity supported Standing balance-Leahy Scale: Fair Standing balance comment: Able to stand statically without UE support but benefits from UE support for mobility                            Cognition Arousal/Alertness: Awake/alert Behavior During Therapy: WFL for tasks assessed/performed (upset in regards to pain meds at times) Overall Cognitive Status: Impaired/Different from baseline Area of Impairment: Attention, Memory, Awareness, Problem solving                   Current Attention Level: Alternating Memory: Decreased short-term memory     Awareness: Emergent Problem Solving: Requires verbal cues General Comments: motivated to progress, able to dual task with standing activities. Pt upset at times in regards to her pain meds schedule, needing re-education on what she has received and what she can by RN.        Exercises      General Comments General comments (skin integrity, edema, etc.): VSS on RA (poor pleth reading SpO2 in 70s% at one point, but once pleth improved it read in mid-90s%)      Pertinent Vitals/Pain Pain Assessment Pain Assessment: Faces Faces Pain Scale: Hurts little more Pain Location: buttocks Pain Descriptors / Indicators: Grimacing, Discomfort Pain Intervention(s): Limited activity within patient's tolerance, Monitored during session, Premedicated before session, Repositioned, Patient requesting pain meds-RN notified, RN gave pain meds  during session    Home Living                          Prior Function            PT Goals (current goals can now be found in the care plan section) Acute Rehab PT Goals Patient Stated Goal: to go home PT Goal Formulation: With patient Time For Goal Achievement: 07/07/22 Potential to Achieve Goals: Good Progress towards PT goals: Progressing toward goals    Frequency    Min 3X/week      PT Plan Current plan remains appropriate    Co-evaluation              AM-PAC PT "6 Clicks" Mobility   Outcome Measure  Help needed turning from your back to your side while in a flat bed without using bedrails?: A Little Help needed moving from lying on your back to sitting on the side of a flat bed without using bedrails?: A Little Help needed moving to and from a bed to a chair (including a wheelchair)?: A Little Help needed standing up from a chair using your arms (e.g., wheelchair or bedside chair)?: A Little Help needed to walk in hospital room?: A Little Help needed climbing 3-5 steps with a railing? : A Little 6 Click Score: 18    End of Session Equipment Utilized During Treatment: Oxygen;Gait belt Activity Tolerance: Patient tolerated treatment well Patient left: with call bell/phone within reach;in bed Nurse  Communication: Mobility status;Patient requests pain meds PT Visit Diagnosis: Other abnormalities of gait and mobility (R26.89);Muscle weakness (generalized) (M62.81);Unsteadiness on feet (R26.81);Other symptoms and signs involving the nervous system (R29.898);Difficulty in walking, not elsewhere classified (R26.2)     Time: 5284-1324 PT Time Calculation (min) (ACUTE ONLY): 49 min  Charges:  $Gait Training: 23-37 mins $Therapeutic Activity: 8-22 mins                     Raymond Gurney, PT, DPT Acute Rehabilitation Services  Office: [phone]    Monica Hopkins 06/23/2022, 1:53 PM

## 2022-06-24 ENCOUNTER — Inpatient Hospital Stay (HOSPITAL_COMMUNITY): Payer: Medicaid Other

## 2022-06-24 DIAGNOSIS — R9389 Abnormal findings on diagnostic imaging of other specified body structures: Secondary | ICD-10-CM

## 2022-06-24 DIAGNOSIS — Z515 Encounter for palliative care: Secondary | ICD-10-CM

## 2022-06-24 LAB — CBC WITH DIFFERENTIAL/PLATELET
Abs Immature Granulocytes: 0.02 10*3/uL (ref 0.00–0.07)
Basophils Absolute: 0 10*3/uL (ref 0.0–0.1)
Basophils Relative: 1 %
Eosinophils Absolute: 0.1 10*3/uL (ref 0.0–0.5)
Eosinophils Relative: 2 %
HCT: 25.6 % — ABNORMAL LOW (ref 36.0–46.0)
Hemoglobin: 8.2 g/dL — ABNORMAL LOW (ref 12.0–15.0)
Immature Granulocytes: 0 %
Lymphocytes Relative: 49 %
Lymphs Abs: 3 10*3/uL (ref 0.7–4.0)
MCH: 32.3 pg (ref 26.0–34.0)
MCHC: 32 g/dL (ref 30.0–36.0)
MCV: 100.8 fL — ABNORMAL HIGH (ref 80.0–100.0)
Monocytes Absolute: 0.7 10*3/uL (ref 0.1–1.0)
Monocytes Relative: 12 %
Neutro Abs: 2.2 10*3/uL (ref 1.7–7.7)
Neutrophils Relative %: 36 %
Platelets: 271 10*3/uL (ref 150–400)
RBC: 2.54 MIL/uL — ABNORMAL LOW (ref 3.87–5.11)
RDW: 16.8 % — ABNORMAL HIGH (ref 11.5–15.5)
WBC: 6.2 10*3/uL (ref 4.0–10.5)
nRBC: 0 % (ref 0.0–0.2)

## 2022-06-24 LAB — COMPREHENSIVE METABOLIC PANEL
ALT: 23 U/L (ref 0–44)
AST: 23 U/L (ref 15–41)
Albumin: 2.4 g/dL — ABNORMAL LOW (ref 3.5–5.0)
Alkaline Phosphatase: 70 U/L (ref 38–126)
Anion gap: 7 (ref 5–15)
BUN: 37 mg/dL — ABNORMAL HIGH (ref 6–20)
CO2: 31 mmol/L (ref 22–32)
Calcium: 9.4 mg/dL (ref 8.9–10.3)
Chloride: 99 mmol/L (ref 98–111)
Creatinine, Ser: 0.43 mg/dL — ABNORMAL LOW (ref 0.44–1.00)
GFR, Estimated: >60 mL/min (ref >60)
Glucose, Bld: 111 mg/dL — ABNORMAL HIGH (ref 70–99)
Potassium: 4 mmol/L (ref 3.5–5.1)
Sodium: 137 mmol/L (ref 135–145)
Total Bilirubin: 0.3 mg/dL (ref 0.3–1.2)
Total Protein: 5.8 g/dL — ABNORMAL LOW (ref 6.5–8.1)

## 2022-06-24 LAB — GLUCOSE, CAPILLARY
Glucose-Capillary: 85 mg/dL (ref 70–99)
Glucose-Capillary: 93 mg/dL (ref 70–99)
Glucose-Capillary: 122 mg/dL — ABNORMAL HIGH (ref 70–99)

## 2022-06-24 LAB — PHOSPHORUS: Phosphorus: 3.6 mg/dL (ref 2.5–4.6)

## 2022-06-24 LAB — MAGNESIUM: Magnesium: 1.8 mg/dL (ref 1.7–2.4)

## 2022-06-24 MED ORDER — OXYCODONE HCL ER 10 MG PO T12A
20.0000 mg | EXTENDED_RELEASE_TABLET | Freq: Two times a day (BID) | ORAL | Status: DC
  Administered 2022-06-24 – 2022-06-25 (×2): 20 mg via ORAL
  Filled 2022-06-24 (×2): qty 2

## 2022-06-24 MED ORDER — HYDROMORPHONE HCL 2 MG PO TABS
4.0000 mg | ORAL_TABLET | ORAL | Status: DC | PRN
  Administered 2022-06-24 – 2022-06-25 (×4): 4 mg via ORAL
  Filled 2022-06-24 (×4): qty 2

## 2022-06-24 MED ORDER — KETOROLAC TROMETHAMINE 15 MG/ML IJ SOLN
15.0000 mg | Freq: Three times a day (TID) | INTRAMUSCULAR | Status: DC
  Administered 2022-06-24 – 2022-06-25 (×3): 15 mg via INTRAVENOUS
  Filled 2022-06-24 (×3): qty 1

## 2022-06-24 MED ORDER — HYDROMORPHONE HCL 1 MG/ML IJ SOLN
0.5000 mg | INTRAMUSCULAR | Status: DC | PRN
  Administered 2022-06-24 – 2022-06-25 (×4): 0.5 mg via INTRAVENOUS
  Filled 2022-06-24 (×4): qty 0.5

## 2022-06-24 MED ORDER — ENOXAPARIN SODIUM 40 MG/0.4ML IJ SOSY
40.0000 mg | PREFILLED_SYRINGE | INTRAMUSCULAR | Status: DC
  Administered 2022-06-25: 40 mg via SUBCUTANEOUS
  Filled 2022-06-24: qty 0.4

## 2022-06-24 MED ORDER — SODIUM CHLORIDE 0.9 % IV BOLUS
1000.0000 mL | Freq: Once | INTRAVENOUS | Status: AC
  Administered 2022-06-24: 1000 mL via INTRAVENOUS

## 2022-06-24 MED ORDER — SODIUM CHLORIDE 0.9 % IV BOLUS: 500.0000 mL | Freq: Once | INTRAVENOUS | Status: DC

## 2022-06-24 NOTE — Progress Notes (Signed)
Speech Language Pathology Treatment: Dysphagia;Passy Muir Speaking valve  Patient Details Name: Monica Hopkins MRN: [id number] DOB: 11/18/1970 Today's Date: 06/24/2022 Time: 1125-1140 SLP Time Calculation (min) (ACUTE ONLY): 15 min  Assessment / Plan / Recommendation Clinical Impression  Pt was seen at bedside for skilled ST intervention targeting goals for progress to least restrictive diet. Pt was resting in bed, awake. She reported not feeling well, and required some encouragement to participate in treatment. Pt has poor dentition, and she indicates not being able to chew crackers or other hard items. She tolerated trials of canned peaches with timely oral prep and adequate clearing. No overt s/s aspiration following peaches or thin liquid trials. Recommend advancing diet to dys2 (minced/finely chopped) with thin liquids. Safe swallow precautions and whiteboard updated in pt room. SLP will continue to follow acutely to assess tolerance of advanced diet and PMSV care and use. RN and MD informed.    HPI HPI: Pt is a 51 y.o. female admitted 05/19/22 after cardiac arrest at home, ROSC 26 min, hypothermic. ETT 10/17-10/23; reintubated 10/23-trach10/25. Pt with new HFpEF. MRI brain negative for acute changes. CXR 10/30: Stable multifocal pulmonary infiltrates and small left pleural effusion. MBS 11/3 rx NPO; G-tube being considered and IR consulted, but per radiology, anatomy is highly unfavorable and a relative contraindication. PMH: bipolar disorder.      SLP Plan  Continue with current plan of care      Recommendations for follow up therapy are one component of a multi-disciplinary discharge planning process, led by the attending physician.  Recommendations may be updated based on patient status, additional functional criteria and insurance authorization.    Recommendations  Diet recommendations: Dysphagia 2 (fine chop);Thin liquid Liquids provided via: Cup;Straw Medication Administration:  Via alternative means Supervision: Intermittent supervision to cue for compensatory strategies Compensations: Effortful swallow;Minimize environmental distractions;Slow rate;Small sips/bites Postural Changes and/or Swallow Maneuvers: Seated upright 90 degrees      Patient may use Passy-Muir Speech Valve: During PO intake/meals;During all waking hours (remove during sleep) PMSV Supervision: Intermittent         Oral Care Recommendations: Oral care BID Follow Up Recommendations: Acute inpatient rehab (3hours/day) Assistance recommended at discharge: Frequent or constant Supervision/Assistance SLP Visit Diagnosis: Dysphagia, pharyngeal phase (R13.13) Plan: Continue with current plan of care          Azure Budnick B. Murvin Natal, [facility], CCC-SLP Speech Language Pathologist Office: [phone]  Monica Hopkins 06/24/2022, 12:00 PM

## 2022-06-24 NOTE — TOC Initial Note (Signed)
Transition of Care (TOC) - Initial/Assessment Note  Monica Pierini RN, BSN Transitions of Care Unit 4E- RN Case Manager See Treatment Team for direct phone #   Patient Details  Name: Monica Hopkins MRN: [id number] Date of Birth: 11-16-1970  Transition of Care [facility]) CM/SW Contact:    Monica Span, RN Phone Number: 06/24/2022, 3:03 PM  Clinical Narrative:                 Spoke with pt at bedside to discuss transition needs. Therapy has updated recs to [facility], CIR has seen pt this am and signed off.  Per discussion with pt- she is planning on going to stay with her daughterCharlotte Hopkins- she does not know the address- but will try to get address so that CM can provide to [facility] agency.  Discussed with pt HH referral to [facility] program- pt agreeable- once address for discharge known and HH orders placed- CM will f/u and place referral to covering [facility] agency- this week that is [facility].  They will have to review to see if pt qualifies and confirm that they can provide services.   CM also discussed DME needs- pt will need both BSC and rollator for home- will request DME orders and place referral to [facility] DME needs. DME to be delivered to room prior to discharge.   Pt confirmed she is goes to Dr. Jeannetta Hopkins for Primary care needs and plans to continue there for follow up.   Pt asking questions about disability and Medicaid- per chart review note that [facility] [employer] has been following- email sent to Monica Hopkins with [facility] to reach out to pt and follow up on her questions regarding paperwork. Monica Hopkins has acknowledged and will contact pt tomorrow.   TOC to continue to follow   Expected Discharge Plan: Home w Home Health Services Barriers to Discharge: Continued Medical Work up   Patient Goals and CMS Choice Patient states their goals for this hospitalization and ongoing recovery are:: return home w/ daughter/family CMS Medicare.gov Compare Post Acute Care list provided to::  Patient Choice offered to / list presented to : [facility] referral)  Expected Discharge Plan and Services Expected Discharge Plan: Home w Home Health Services In-house Referral: Clinical Social Work Discharge Planning Services: CM Consult Post Acute Care Choice: Home Health, Durable Medical Equipment Living arrangements for the past 2 months: Single Family Home                 DME Arranged: Bedside commode, Walker rolling with seat DME Agency: [facility]       HH Arranged: PT, OT HH Agency: [facility]        Prior Living Arrangements/Services Living arrangements for the past 2 months: Single Family Home Lives with:: Spouse Patient language and need for interpreter reviewed:: Yes Do you feel safe going back to the place where you live?: Yes      Need for Family Participation in Patient Care: Yes (Comment) Care giver support system in place?: Yes (comment)   Criminal Activity/Legal Involvement Pertinent to Current Situation/Hospitalization: No - Comment as needed  Activities of Daily Living Home Assistive Devices/Equipment: None ADL Screening (condition at time of admission) Patient's cognitive ability adequate to safely complete daily activities?: Yes Is the patient deaf or have difficulty hearing?: Yes Does the patient have difficulty seeing, even when wearing glasses/contacts?: No Does the patient have difficulty concentrating, remembering, or making decisions?: No Patient able to express need for assistance with  ADLs?: Yes Does the patient have difficulty dressing or bathing?: Yes Independently performs ADLs?: No Communication: Independent Dressing (OT): Dependent Is this a change from baseline?: Pre-admission baseline Grooming: Dependent Is this a change from baseline?: Pre-admission baseline Feeding: Independent Bathing: Dependent Is this a change from baseline?: Pre-admission baseline Toileting: Needs assistance Is this a change  from baseline?: Pre-admission baseline In/Out Bed: Dependent Is this a change from baseline?: Pre-admission baseline Walks in Home: Dependent Is this a change from baseline?: Pre-admission baseline Does the patient have difficulty walking or climbing stairs?: Yes Weakness of Legs: Both Weakness of Arms/Hands: Both  Permission Sought/Granted Permission sought to share information with : Facility Industrial/product designer granted to share information with : Yes, Verbal Permission Granted     Permission granted to share info w AGENCY: DME/HH        Emotional Assessment Appearance:: Appears older than stated age Attitude/Demeanor/Rapport: Engaged Affect (typically observed): Accepting, Appropriate Orientation: : Oriented to Self, Oriented to Place, Oriented to  Time, Oriented to Situation Alcohol / Substance Use: Not Applicable Psych Involvement: No (comment)  Admission diagnosis:  Cardiac arrest [facility]) [I46.9] Patient Active Problem List   Diagnosis Date Noted   Dysphagia 06/14/2022   Chronic diastolic CHF (congestive heart failure) (HCC) 06/13/2022   Rib fracture 06/13/2022   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   Mucus plug in respiratory tract 06/08/2022   Pleural effusion 06/08/2022   Pneumonia of both lungs due to infectious organism 05/21/2022   Acute respiratory failure with hypoxia (HCC) 05/21/2022   Pressure injury of skin 05/20/2022   Protein-calorie malnutrition, severe 05/20/2022   Cardiac arrest (HCC) 05/19/2022   AKI (acute kidney injury) (HCC)    Urinary tract infection with hematuria    Sepsis (HCC) 08/16/2018   Bipolar 1 disorder (HCC) 08/16/2018   Cervical postlaminectomy syndrome 07/10/2015   Chronic pain syndrome 07/10/2015   PCP:  Monica Mask, MD Pharmacy:   [facility] [phone] - [place], [place] - [address] AT [facility] [address] [place] [address] Phone: [phone] Fax: [phone]  [facility] - [place], [place] - [address] [place] [address] Phone: [phone] Fax: [phone]     Social Determinants of Health (SDOH) Interventions    Readmission Risk Interventions     No data to display

## 2022-06-24 NOTE — Progress Notes (Signed)
Palliative Medicine Progress Note   Patient Name: Monica Hopkins       Date: 06/24/2022 DOB: Dec 07, 1970  Age: 51 y.o. MRN#: [id number] Attending Physician: Lonia Blood, MD Primary Care Physician: Kaleen Mask, MD Admit Date: 05/19/2022  Reason for Consultation/Follow-up: Pain control  HPI/Patient Profile: Patient is a 51 year old Caucasian female with a past medical history significant for but limited to bipolar disorder who was admitted 05/19/22 after cardiac arrest at home. ROSC 26 minutes. Intubated in the ED.  She was treated for sepsis d/t pneumonia. She had a trach placed 05/27/22.  She has a new diagnosis of HFpEF. She has fractures through her Left anterior 2nd and 6th ribs from CPR.  Palliative care was initially consulted for goals of care conversations in the setting of the need for a gastrostomy tube.   Patient is now able to maintain her nutrition orally so cortrak has been removed.   Palliative signed off 11/9 and was re-consulted 11/22 for pain management.   Subjective: Chart reviewed and patient seen at bedside. She has just finished ambulating in the hallway and is sitting on the edge of the bed. She is on room air now and speaks very well with passy muir valve.    I introduced myself and let her know that Palliative Medicine had been re-consulted to help with pain control.   Tamieka confirms that she has acute pain in her ribs bilaterally (front and back) due to rib fractures from CPR. She also tells me she has chronic neck/back pain due to history of spinal stenosis and ruptured discs. She reports going to a pain clinic some years ago but not recently.   Lina confirms that her pain is not well-controlled. She reports IV dilaudid provides the most relief, but  it wears off quickly (within an hour). We discussed that the goal is to wean her off the IV dilaudid since she cannot discharge on this. We also discussed that the hospital will only send her home with no more than 7 days worth of opioids. I emphasized she would need to establish with outpatient pain management if she needs ongoing .  Discussed situation with Dr. Sharon Seller via secure chat. Discussed that toradol had been discontinued 11/17 due to concern for GI bleed, which appears to have been since ruled out. He agrees with  re-starting toradol. Also discussed starting long-acting opioid to help reduce need for IV dilaudid. Initially ordered oxycontin, but then changed to [facility] since patient has said oxy is not effective for her.    Objective:  Physical Exam Vitals reviewed.  Constitutional:      General: She is not in acute distress.    Appearance: She is ill-appearing.     Comments: Frail, temporal wasting  Neurological:     Mental Status: She is alert and oriented to person, place, and time.             Vital Signs: BP 101/67 (BP Location: Right Arm)   Pulse 91   Temp 99.1 F (37.3 C) (Oral)   Resp 17   Ht 5\' 6"  (1.676 m)   Wt 47.8 kg   SpO2 97%   BMI 17.01 kg/m  SpO2: SpO2: 97 % O2 Device: O2 Device: Room Air    LBM: Last BM Date : 06/23/22     Palliative Medicine Assessment & Plan   Assessment: Principal Problem:   Cardiac arrest [facility]) Active Problems:   Pressure injury of skin   Protein-calorie malnutrition, severe   Pneumonia of both lungs due to infectious organism   Acute respiratory failure with hypoxia (HCC)   Mucus plug in respiratory tract   Pleural effusion   Chronic diastolic CHF (congestive heart failure) (HCC)   Rib fracture   Tracheostomy dependence (HCC)   Dysphagia    PMP reviewed. Patient has not received any opioid prescriptions in the last 12 months.   Per MAR, in the past 24 hours patient has received 12 mg of PO dilaudid and 2 mg IV  dilaudid.   Recommendations/Plan: Start long-acting dilaudid (EXALGO) 12 mg daily Toradol 15 mg IV every 8 hours Continue dilaudid PO 4 mg every 3 hours prn moderate/severe pain Continue dilaudid IV 0.5 mg every 3 hours prn for breakthrough pain Use IV dilaudid ONLY if pain is not relieved by PO dilaudid Continue bowel regimen   - senna-docusate 1 tablet daily at bedtime  - miralax daily as needed   Code Status: Full code   Thank you for allowing the Palliative Medicine Team to assist in the care of this patient.   Greater than 50%  of this time was spent counseling and coordinating care related to the above assessment and plan.  Total time: 65 minutes   [facility], NP Palliative Medicine   Please contact Palliative Medicine Team phone at [phone] for questions and concerns.  For individual provider, see AMION.

## 2022-06-24 NOTE — Progress Notes (Signed)
SLP Cancellation Note  Patient Details Name: Monica Hopkins MRN: [id number] DOB: February 10, 1971   Cancelled treatment:       Reason Eval/Treat Not Completed: Patient at procedure or test/unavailable  Pt currently working with another discipline. Monica continue efforts toward advancing diet.   Shakeila Pfarr B. Murvin Natal, [facility], CCC-SLP Speech Language Pathologist Office: [phone]  Leigh Aurora 06/24/2022, 10:30 AM

## 2022-06-24 NOTE — Progress Notes (Signed)
Per CCM order, RT decannulated pt. RT placed gauze over stoma. Pt tolerated well with SVS. RN notified. RT will continue to monitor pt.

## 2022-06-24 NOTE — Progress Notes (Addendum)
Mobility Specialist Progress Note:   06/24/22 1541  Mobility  Activity Ambulated with assistance in hallway  Level of Assistance Contact guard assist, steadying assist  Assistive Device Four wheel walker  Distance Ambulated (ft) 700 ft  Activity Response Tolerated well  $Mobility charge 1 Mobility   Pt received EOB willing to participate in mobility. No complaints of pain. Left EOB with call bell in reach and all needs met.   Gareth Eagle Halli Equihua Mobility Specialist Please contact via [employer] or  Rehab Office at [phone]

## 2022-06-24 NOTE — Progress Notes (Signed)
Mobility Specialist Progress Note:   06/24/22 1000  Mobility  Activity Ambulated with assistance in hallway  Level of Assistance Contact guard assist, steadying assist  Assistive Device Four wheel walker  Distance Ambulated (ft) 450 ft  Activity Response Tolerated well  $Mobility charge 1 Mobility   During Mobility: 130 HR Post Mobility: 90 HR; 92% SpO2  Pt received EOB willing to participate in mobility. Complaints of 7/10 bottom pain. Left in bed with call bell in reach and all needs met.   Monica Hopkins Mobility Specialist Please contact via [employer] or  Rehab Office at [phone]

## 2022-06-24 NOTE — Progress Notes (Addendum)
Monica Hopkins  [id number] DOB: November 02, 1970 DOA: 05/19/2022 PCP: Leonard Downing, MD    Brief Narrative:  51 year old who was brought to the hospital in cardiac arrest and hypothermic requiring intubation upon arrival in the ER.  Significant Events: 10/17: admitted post cardiac arrest; ROSC 26 minutes 10/20: off pressors, will awaken agitated, desaturates SBT 10/23 failed extubation trial 10/25 trach 11/2 transferred to the [facility] service 11/2 she desaturated on trach collar and PCCM was reconsulted 11/3 Continues to fail at swallowing but resistant to PEG tube.  Consulted palliative care for further goals of care discussion and will also need a psychiatric evaluation 11/4 rapid response called given desaturation due to L mucous plugging and small effusion 11/5 Transferred to ICU after recurrent desaturation.  She was refusing chest vest, metaneb.   11/6 Tolerating trach collar, accepting meds, c/o pain 11/7 IR consulted for PEG, unable to obtain due to anatomy.  General Surgery recommended conservative management, core track, diet as tolerated. 11/10 started on liquid diet.  Did poorly the first day, was noncompliant. 11/12-13 tolerating liquid diet  11/15 tolerating full liquids  11/21 calorie count confirms is able to maintain her nutrition orally - discontinue cortrak  Consultants:  PCCM  Goals of Care:  Code Status: Full Code   DVT prophylaxis: Subcutaneous heparin  Interim Hx: Afebrile.  Systolics [id number].  Saturation 95% room air.  Reports poor control of her pain, with the pain working initially but then rapidly wearing off prior to her next dose.  Has a strong preference for IV Dilaudid versus any other oral medication.  Assessment & Plan:  PEA cardiac arrest due to hypoxia Has recovered and is stable from this standpoint on room air  Streptococcus pneumonia with respiratory arrest status post tracheostomy Has completed antibiotic therapy -tracheostomy course has  been complicated by poor cough mechanics, deconditioning, and intermittent mucous plugging -SLP working with Passy-Muir valve -PCCM reconsulted to consider decannulation today  Aspiration and healthcare associated Enterobacter Klebsiella and stenotrophomonas pneumonia Has completed a course of antibiotic therapy - no persisting symptoms to suggest active infection  Abnormal CXR if infitlrate does not fully resolve within 2 months, needs CT to re-evaluate  Newly diagnosed grade 1 diastolic CHF Well compensated at present  Hypotension Blood pressure stable at present  Hypomagnesemia Corrected with supplementation  Hyponatremia Resolved with volume resuscitation  Acute blood loss anemia on anemia of chronic disease Felt to represent upper GI bleed likely related to duodenal ulcer with history of same -no evidence of ongoing blood loss at present  Severe protein calorie malnutrition Calorie count has proven that she can now maintain sufficient oral intake  Family Communication: Spoke with son at bedside Disposition: From home -medically stable for discharge back home with supportive services -if trach is able to be removed today the patient will be a potential candidate for discharge home 11/23   Objective: Blood pressure 92/62, pulse 95, temperature 98.7 F (37.1 C), temperature source Oral, resp. rate 18, height 5\' 6"  (1.676 m), weight 47.8 kg, SpO2 95 %.  Intake/Output Summary (Last 24 hours) at 06/24/2022 0916 Last data filed at 06/24/2022 0800 Gross per 24 hour  Intake 120 ml  Output 200 ml  Net -80 ml   Filed Weights   06/17/22 0455 06/18/22 0405 06/21/22 0307  Weight: 48.5 kg 48.2 kg 47.8 kg    Examination: General: No acute respiratory distress Lungs: Clear to auscultation bilaterally without wheezes or crackles Cardiovascular: Regular rate and rhythm without murmur gallop or rub  normal S1 and S2 Abdomen: Nontender, nondistended, soft, bowel sounds positive, no  rebound, no ascites, no appreciable mass Extremities: No significant cyanosis, clubbing, or edema bilateral lower extremities  CBC: Recent Labs  Lab 06/22/22 0126 06/23/22 0827 06/24/22 0146  WBC 6.2 4.4 6.2  NEUTROABS 2.7 2.3 2.2  HGB 8.0* 9.1* 8.2*  HCT 25.4* 28.8* 25.6*  MCV 102.8* 101.8* 100.8*  PLT 227 260 271   Basic Metabolic Panel: Recent Labs  Lab 06/22/22 0126 06/23/22 0827 06/24/22 0146  NA 136 138 137  K 3.9 4.0 4.0  CL 98 99 99  CO2 29 32 31  GLUCOSE 107* 102* 111*  BUN 47* 33* 37*  CREATININE 0.45 0.48 0.43*  CALCIUM 9.4 9.7 9.4  MG 2.0 1.9 1.8  PHOS 4.0 4.2 3.6   GFR: Estimated Creatinine Clearance: 62.8 mL/min (A) (by C-G formula based on SCr of 0.43 mg/dL (L)).   Scheduled Meds:  acetaminophen  650 mg Oral QID   ascorbic acid  500 mg Oral BID   Chlorhexidine Gluconate Cloth  6 each Topical Daily   clonazePAM  0.5 mg Oral Daily   clonazePAM  2 mg Oral QHS   collagenase   Topical Daily   famotidine  20 mg Oral BID   feeding supplement  237 mL Oral QID   fluticasone  1 spray Each Nare Daily   folic acid  1 mg Oral Daily   gabapentin  300 mg Oral BID   heparin  5,000 Units Subcutaneous Q8H   hydrocortisone   Rectal BID   lidocaine  1 patch Transdermal Q24H   magic mouthwash  5 mL Oral TID   metoprolol tartrate  12.5 mg Oral BID   nutrition supplement (JUVEN)  1 packet Oral BID BM   mouth rinse  15 mL Mouth Rinse 4 times per day   pantoprazole  40 mg Oral Daily   revefenacin  175 mcg Nebulization Daily   senna-docusate  1 tablet Oral QHS   thiamine  100 mg Oral Daily   valproic acid  500 mg Oral BID   vitamin A  10,000 Units Oral Daily   Continuous Infusions:  sodium chloride     sodium chloride Stopped (05/27/22 1640)     LOS: 36 days   Lonia Blood, MD [facility] Office  [phone] Pager - Text Page per Amion  If 7PM-7AM, please contact night-coverage per Amion 06/24/2022, 9:16 AM

## 2022-06-24 NOTE — Progress Notes (Signed)
Occupational Therapy Treatment Patient Details Name: Monica Hopkins MRN: [id number] DOB: 1970-11-18 Today's Date: 06/24/2022   History of present illness Pt is a 51 y.o. female admitted 05/19/22 with cardiac arrest at home, ROSC 26 min, hypothermic, rib fx from CPR. Intubated 10/17, failed extubation 10/23; trach placed 10/25. Pt with new HFpEF. Rapid response 11/4 due to recurrent desaturation with L mucous plugging and small effusion; transfer back to ICU 11/5-11/8. IR consulted for PEG 11/7. PMH includes bipolar disorder.   OT comments  Pt with generalized pain, requiring encouragement to participate in ADL training. Pt demonstrated ability to groom with supervision at EOB, declined standing at sink. Changed brief and performed pericare with baby wipe with min guard assist. Pt aware of her poor memory, discussed compensatory strategies. Her daughter will manage her meds and keep her MD appointment schedule upon discharge. Pt with stable VS throughout session on RA.   Recommendations for follow up therapy are one component of a multi-disciplinary discharge planning process, led by the attending physician.  Recommendations may be updated based on patient status, additional functional criteria and insurance authorization.    Follow Up Recommendations  Home health OT     Assistance Recommended at Discharge Frequent or constant Supervision/Assistance  Patient can return home with the following  A little help with walking and/or transfers;A little help with bathing/dressing/bathroom;Assistance with cooking/housework;Direct supervision/assist for medications management;Direct supervision/assist for financial management;Assist for transportation;Help with stairs or ramp for entrance   Equipment Recommendations  BSC/3in1;Other (comment) (rollator)    Recommendations for Other Services      Precautions / Restrictions Precautions Precautions: Fall;Other (comment) Precaution Comments: trach  collar, multiple wounds along spine and sacrum, watch BP and SpO2 Restrictions Weight Bearing Restrictions: No       Mobility Bed Mobility Overal bed mobility: Needs Assistance Bed Mobility: Sit to Supine, Supine to Sit     Supine to sit: Min guard, HOB elevated Sit to supine: HOB elevated, Min guard   General bed mobility comments: no physical assist, used rail, HOB up    Transfers Overall transfer level: Needs assistance Equipment used: Rolling walker (2 wheels) Transfers: Sit to/from Stand Sit to Stand: Min guard                 Balance Overall balance assessment: Needs assistance Sitting-balance support: Feet supported Sitting balance-Leahy Scale: Fair     Standing balance support: During functional activity Standing balance-Leahy Scale: Fair Standing balance comment: can release walker in static standing while managing brief                           ADL either performed or assessed with clinical judgement   ADL Overall ADL's : Needs assistance/impaired Eating/Feeding: Independent;Bed level   Grooming: Brushing hair;Wash/dry hands;Wash/dry face;Sitting;Supervision/safety           Upper Body Dressing : Set up;Sitting   Lower Body Dressing: Min guard;Sit to/from stand Lower Body Dressing Details (indicate cue type and reason): changed brief                    Extremity/Trunk Assessment              Vision       Perception     Praxis      Cognition Arousal/Alertness: Awake/alert Behavior During Therapy: Flat affect Overall Cognitive Status: Impaired/Different from baseline Area of Impairment: Attention, Memory, Awareness, Problem solving  Current Attention Level: Alternating Memory: Decreased short-term memory Following Commands: Follows multi-step commands inconsistently Safety/Judgement: Decreased awareness of deficits, Decreased awareness of safety Awareness: Emergent Problem Solving:  Requires verbal cues General Comments: continues to require encouragement to participate in ADL training        Exercises      Shoulder Instructions       General Comments      Pertinent Vitals/ Pain       Pain Assessment Pain Assessment: Faces Faces Pain Scale: Hurts even more Pain Location: generalized Pain Descriptors / Indicators: Discomfort, Grimacing Pain Intervention(s): RN gave pain meds during session  Home Living                                          Prior Functioning/Environment              Frequency  Min 2X/week        Progress Toward Goals  OT Goals(current goals can now be found in the care plan section)  Progress towards OT goals: Progressing toward goals  Acute Rehab OT Goals OT Goal Formulation: With patient Time For Goal Achievement: 06/26/22 Potential to Achieve Goals: Good  Plan Discharge plan remains appropriate    Co-evaluation                 AM-PAC OT "6 Clicks" Daily Activity     Outcome Measure   Help from another person eating meals?: None Help from another person taking care of personal grooming?: A Little Help from another person toileting, which includes using toliet, bedpan, or urinal?: A Little Help from another person bathing (including washing, rinsing, drying)?: A Little Help from another person to put on and taking off regular upper body clothing?: A Little Help from another person to put on and taking off regular lower body clothing?: A Little 6 Click Score: 19    End of Session Equipment Utilized During Treatment: Rolling walker (2 wheels)  OT Visit Diagnosis: Muscle weakness (generalized) (M62.81);Other symptoms and signs involving cognitive function;Pain   Activity Tolerance Patient tolerated treatment well   Patient Left in bed;with call bell/phone within reach   Nurse Communication          Time: 7829-5621 OT Time Calculation (min): 19 min  Charges: OT General  Charges $OT Visit: 1 Visit OT Treatments $Self Care/Home Management : 8-22 mins  Berna Spare, OTR/L Acute Rehabilitation Services Office: [phone]   Evern Bio 06/24/2022, 10:57 AM

## 2022-06-24 NOTE — Consult Note (Signed)
NAME:  Monica Hopkins, MRN:  [id number], DOB:  [birth year], LOS: 36 ADMISSION DATE:  05/19/2022, CONSULTATION DATE:  11/22 REFERRING MD:  Nash Mantis FOR CONSULT:  Trach managment  History of Present Illness:  Monica Hopkins is an 51 y.o. F who presented to [facility] on 10/17 after a cardiac arrest. She was found to be hypothermic and intubated in the emergency department.  She was admitted by PCCM on 11/17, failed extubation on 10/23, trached on 10/25.  She was transferred to [facility] on 11/2.  Evening of 11/5 she was transferred back to the ICU secondary to desaturations secondary to mucous plugging from refusing chest CPT.  She was transferred back to [facility] on 11/6.  Since her transfer back to [facility] she has worked with SLP.  Tolerating Passy-Muir valve.  Tolerating dysphagia 1 diet.  Cortrack removed.  She is day 28 post 6.0 Shiley cuffed tracheostomy.  Per RT patient has been refusing suctioning but managing secretions on her own without issues utilizing yaunker.  Patient documented on 21% humidified medical air has questionable utilization of humidification.  PCCM continuing to follow for tracheostomy management.  Pertinent  Medical History  Bipolar disorder, HFpEF, severe protein calorie malnutrition, anxiety, depression, chronic pain  Significant Hospital Events: Including procedures, antibiotic start and stop dates in addition to other pertinent events   10/17: admitted post cardiac arrest; ROSC 26 minutes 10/20: off pressors, will awaken agitated, desaturates SBT.  10/23 failed extubation trial 10/25 trach 11/4 desaturation in setting L mucus plugging and small effusion. Has been refusing CPT 11/5 Desaturation again this morning, refusing chest vest, metaneb. Did metaneb after some insistence this morning with RT, Moved to ICU 11/6 tolerating trach collar, accepting nebs, c/o pain 06/09/22  IR consulted for PEG, unable to secondary to anatomy.  General surgery recommending conservative  management, core track, diet as tolerated. 11/10 started on liquid diet.  Did poorly the first day, was noncompliant. 11/11 n.p.o. given poor compliance with proper diet, respiratory status decline, made NPO 11/12-13 tolerating liquid diet  06/17/2022.  She is tolerating full liquids and will have SLP reevaluate.  We will do a calorie count and see if the patient can tolerate a soft diet Patient is now on a dysphagia 1 diet and doing a calorie count and we will give her another day to see if she tolerates her meals okay.  If she does we can remove her core track tube and PT OT have now changed the recommendations to home with home health given that she is doing much better from mobility standpoint and ambulated 550 feet.  We will continue the Isharani p.o. 3 times daily and Prosource +30 mL twice daily 11/21-tolerated her calorie count and able to maintain her nutrition orally so we will discontinue cortrak.  PT OT now recommending home health still and CIR has signed off the case.  Patient ambulating fairly well but continues to complain of significant pain.  Have consulted palliative for assistance with pain control given the amount of pain that she is having after her cardiac arrest  Interim History / Subjective:  See above  Subjective: denies chest pain, denies sob, endorses mild cough  Objective   Blood pressure 101/67, pulse 91, temperature 99.1 F (37.3 C), temperature source Oral, resp. rate 17, height 5\' 6"  (1.676 m), weight 47.8 kg, SpO2 97 %.    FiO2 (%):  [21 %] 21 %   Intake/Output Summary (Last 24 hours) at 06/24/2022 1429 Last data filed at 06/24/2022 1100  Gross per 24 hour  Intake 360 ml  Output --  Net 360 ml   Filed Weights   06/17/22 0455 06/18/22 0405 06/21/22 0307  Weight: 48.5 kg 48.2 kg 47.8 kg     General: Ambulating in hall, NAD, appears comfortable, thin HEENT: MM pink/moist, anicteric, atraumatic Neuro: RASS 0, PERRL 69mm, GCS 15 CV: S1S2, ST, no m/r/g  appreciated PULM:  clear in the upper lobes, clear in the lower lobes, trachea midline, chest expansion symmetric, 6.0 shiley trach. Stoma c/d/I. Scant mucous GI: soft, bsx4 active, non-tender   Extremities: warm/dry, no pretibial edema, capillary refill less than 3 seconds  Skin:  no rashes or lesions noted  CXR: No pneumo, stable trach, suspect LLL atelectasis, no pleural effusion   Assessment & Plan:   Acute hypoxic respiratory failure due to strep pneumonia, complicated by aspiration, and HCAP with Enterobacter, Klebsiella, stenotrophomonas-Improving Status post tracheostomy Patient documented on humidified medical air.  Breathing suctioning per RT, however managing with secretions independently. Does not appear to have had any issues with mucous plugging over the last week. No reports of rescue suctioning. Patient tolerating Passy-Muir valve and dysphagia 1 diet. WBC 6.2. On Room air. Afebrile.  -Continue trach care per protocol -Mobilize as able. Continue pulmonary toilet.  -Continue SLP evaluations.   -Discussed with Dr. Chestine Spore, [facility] RT, Dr. Sharon Seller. Ok to decannulate. -PRN albuterol nebs  All other issues per primary. PCCM will sign off. Please call if needed or any questions.   Labs   CBC: Recent Labs  Lab 06/20/22 0117 06/21/22 0116 06/22/22 0126 06/23/22 0827 06/24/22 0146  WBC 7.3 6.8 6.2 4.4 6.2  NEUTROABS 4.0 3.2 2.7 2.3 2.2  HGB 8.0* 7.9* 8.0* 9.1* 8.2*  HCT 24.9* 24.8* 25.4* 28.8* 25.6*  MCV 99.6 101.2* 102.8* 101.8* 100.8*  PLT 227 232 227 260 271    Basic Metabolic Panel: Recent Labs  Lab 06/20/22 0117 06/21/22 0116 06/22/22 0126 06/23/22 0827 06/24/22 0146  NA 136 137 136 138 137  K 4.7 4.1 3.9 4.0 4.0  CL 99 100 98 99 99  CO2 29 29 29  32 31  GLUCOSE 109* 123* 107* 102* 111*  BUN 57* 53* 47* 33* 37*  CREATININE 0.69 0.53 0.45 0.48 0.43*  CALCIUM 9.5 9.5 9.4 9.7 9.4  MG 1.9 1.8 2.0 1.9 1.8  PHOS 3.4 4.5 4.0 4.2 3.6   GFR: Estimated  Creatinine Clearance: 62.8 mL/min (A) (by C-G formula based on SCr of 0.43 mg/dL (L)). Recent Labs  Lab 06/21/22 0116 06/22/22 0126 06/23/22 0827 06/24/22 0146  WBC 6.8 6.2 4.4 6.2    Liver Function Tests: Recent Labs  Lab 06/20/22 0117 06/21/22 0116 06/22/22 0126 06/23/22 0827 06/24/22 0146  AST 22 35 34 30 23  ALT 18 30 33 26 23  ALKPHOS 72 70 64 64 70  BILITOT 0.3 0.2* 0.3 0.2* 0.3  PROT 5.7* 5.6* 5.5* 6.3* 5.8*  ALBUMIN 2.3* 2.4* 2.4* 2.6* 2.4*   No results for input(s): "LIPASE", "AMYLASE" in the last 168 hours. No results for input(s): "AMMONIA" in the last 168 hours.  ABG    Component Value Date/Time   PHART 7.47 (H) 06/06/2022 1040   PCO2ART 35 06/06/2022 1040   PO2ART 64 (L) 06/06/2022 1040   HCO3 25.6 06/06/2022 1040   TCO2 28 06/03/2022 1139   ACIDBASEDEF 2.0 05/20/2022 0243   O2SAT 94.9 06/06/2022 1040     Coagulation Profile: No results for input(s): "INR", "PROTIME" in the last 168 hours.  Cardiac  Enzymes: No results for input(s): "CKTOTAL", "CKMB", "CKMBINDEX", "TROPONINI" in the last 168 hours.  HbA1C: Hgb A1c MFr Bld  Date/Time Value Ref Range Status  05/20/2022 12:06 PM 5.1 4.8 - 5.6 % Final    Comment:    (NOTE) Pre diabetes:          5.7%-6.4%  Diabetes:              >6.4%  Glycemic control for   <7.0% adults with diabetes   06/07/2017 09:31 AM 5.9 (H) 4.8 - 5.6 % Final    Comment:             Prediabetes: 5.7 - 6.4          Diabetes: >6.4          Glycemic control for adults with diabetes: <7.0     CBG: Recent Labs  Lab 06/23/22 1643 06/23/22 2046 06/23/22 2339 06/24/22 0841 06/24/22 1141  GLUCAP 124* 83 103* 85 93    Review of Systems:   Positives in bold  Gen: fever, chills, weight change, fatigue, night sweats HEENT:  blurred vision, double vision, hearing loss, tinnitus, sinus congestion, rhinorrhea, sore throat, neck stiffness, dysphagia PULM:  shortness of breath, cough, sputum production, hemoptysis,  wheezing CV: chest pain, edema, orthopnea, paroxysmal nocturnal dyspnea, palpitations GI:  abdominal pain, nausea, vomiting, diarrhea, hematochezia, melena, constipation, change in bowel habits GU: dysuria, hematuria, polyuria, oliguria, urethral discharge Endocrine: hot or cold intolerance, polyuria, polyphagia or appetite change Derm: rash, dry skin, scaling or peeling skin change Heme: easy bruising, bleeding, bleeding gums Neuro: headache, numbness, weakness, slurred speech, loss of memory or consciousness   Past Medical History:  She,  has a past medical history of Prolapse, disk.   Surgical History:   Past Surgical History:  Procedure Laterality Date   NECK SURGERY       Social History:   reports that she has been smoking cigarettes. She has a 9.00 pack-year smoking history. She has never used smokeless tobacco. She reports that she does not drink alcohol and does not use drugs.   Family History:  Her family history includes Mental illness in her mother and sister.   Allergies Allergies  Allergen Reactions   Abilify [Aripiprazole] Swelling and Rash   Lamictal [Lamotrigine] Swelling and Rash   Latuda [Lurasidone] Other (See Comments)    Caused leg weakness   Seroquel [Quetiapine] Other (See Comments)    Insomnia, restlessness     Home Medications  Prior to Admission medications   Medication Sig Start Date End Date Taking? Authorizing Provider  acetaminophen (TYLENOL) 500 MG tablet Take 1,000 mg by mouth every 6 (six) hours as needed for mild pain.   Yes [provider]  albuterol (PROVENTIL HFA) 108 (90 Base) MCG/ACT inhaler Inhale 1-2 puffs into the lungs every 6 (six) hours as needed for wheezing or shortness of breath.   Yes [provider]  Buprenorphine HCl-Naloxone HCl (SUBOXONE) 8-2 MG FILM Place 0.25-0.5 Film under the tongue daily.   Yes [provider]  clonazePAM (KLONOPIN) 0.5 MG tablet Take one tab twice a daily and 3rd needed  for severe anxiety Patient taking differently: Take 0.5 mg by mouth 2 (two) times daily as needed for anxiety. May take 1 additional tablet if needed for anxiety. 03/10/22  Yes Arfeen, Phillips Grout, MD  diphenhydrAMINE (BENADRYL) 25 MG tablet Take 25 mg by mouth every 6 (six) hours as needed for allergies (congestion).   Yes [provider]  divalproex (  DEPAKOTE ER) 250 MG 24 hr tablet Take 1 tablet (250 mg total) by mouth 2 (two) times daily. 03/10/22  Yes Arfeen, Phillips GroutSyed T, MD  ibuprofen (ADVIL,MOTRIN) 200 MG tablet Take 800 mg by mouth 2 (two) times daily as needed (for pain).   Yes [provider]  traZODone (DESYREL) 100 MG tablet TAKE  ONE TABLET BY MOUTH AT BEDTIME Patient taking differently: Take 100 mg by mouth at bedtime. 03/10/22  Yes Arfeen, Phillips GroutSyed T, MD     Critical care time: N/A      Gershon Musselimothy Scott Zelia Yzaguirre, Jr., MSN, APRN, AGACNP-BC [facility]  06/24/2022 , 2:29 PM  Please see [email or web address] for pager details  If no response, please call [phone] After hours, please call Elink at [phone]

## 2022-06-25 LAB — GLUCOSE, CAPILLARY: Glucose-Capillary: 112 mg/dL — ABNORMAL HIGH (ref 70–99)

## 2022-06-25 LAB — CBC
HCT: 24.7 % — ABNORMAL LOW (ref 36.0–46.0)
Hemoglobin: 7.8 g/dL — ABNORMAL LOW (ref 12.0–15.0)
MCH: 32 pg (ref 26.0–34.0)
MCHC: 31.6 g/dL (ref 30.0–36.0)
MCV: 101.2 fL — ABNORMAL HIGH (ref 80.0–100.0)
Platelets: 264 10*3/uL (ref 150–400)
RBC: 2.44 MIL/uL — ABNORMAL LOW (ref 3.87–5.11)
RDW: 17.2 % — ABNORMAL HIGH (ref 11.5–15.5)
WBC: 8.8 10*3/uL (ref 4.0–10.5)
nRBC: 0 % (ref 0.0–0.2)

## 2022-06-25 LAB — BASIC METABOLIC PANEL
Anion gap: 8 (ref 5–15)
BUN: 33 mg/dL — ABNORMAL HIGH (ref 6–20)
CO2: 30 mmol/L (ref 22–32)
Calcium: 9.1 mg/dL (ref 8.9–10.3)
Chloride: 97 mmol/L — ABNORMAL LOW (ref 98–111)
Creatinine, Ser: 0.63 mg/dL (ref 0.44–1.00)
GFR, Estimated: >60 mL/min (ref >60)
Glucose, Bld: 116 mg/dL — ABNORMAL HIGH (ref 70–99)
Potassium: 4 mmol/L (ref 3.5–5.1)
Sodium: 135 mmol/L (ref 135–145)

## 2022-06-25 MED ORDER — GABAPENTIN 300 MG PO CAPS: 300.0000 mg | ORAL_CAPSULE | Freq: Two times a day (BID) | ORAL | 0 refills | Status: DC

## 2022-06-25 MED ORDER — POLYETHYLENE GLYCOL 3350 17 G PO PACK: 17.0000 g | PACK | Freq: Every day | ORAL | 0 refills | Status: DC | PRN

## 2022-06-25 MED ORDER — METOPROLOL TARTRATE 25 MG PO TABS: 12.5000 mg | ORAL_TABLET | Freq: Two times a day (BID) | ORAL | 2 refills | Status: DC

## 2022-06-25 MED ORDER — SENNOSIDES-DOCUSATE SODIUM 8.6-50 MG PO TABS: 1.0000 | ORAL_TABLET | Freq: Every day | ORAL | Status: DC

## 2022-06-25 MED ORDER — VALPROIC ACID 250 MG PO CAPS: 500.0000 mg | ORAL_CAPSULE | Freq: Two times a day (BID) | ORAL | 0 refills | Status: DC

## 2022-06-25 MED ORDER — OXYCODONE HCL ER 20 MG PO T12A: 20.0000 mg | EXTENDED_RELEASE_TABLET | Freq: Two times a day (BID) | ORAL | 0 refills | Status: DC

## 2022-06-25 MED ORDER — MAGIC MOUTHWASH
5.0000 mL | Freq: Three times a day (TID) | ORAL | Status: DC | PRN
  Administered 2022-06-25: 5 mL via ORAL
  Filled 2022-06-25: qty 5

## 2022-06-25 MED ORDER — HYDROMORPHONE HCL 4 MG PO TABS: 4.0000 mg | ORAL_TABLET | ORAL | 0 refills | Status: DC | PRN

## 2022-06-25 MED ORDER — ACETAMINOPHEN 325 MG PO TABS: 650.0000 mg | ORAL_TABLET | Freq: Four times a day (QID) | ORAL | Status: DC

## 2022-06-25 MED ORDER — TRAZODONE HCL 50 MG PO TABS: 50.0000 mg | ORAL_TABLET | Freq: Every evening | ORAL | 0 refills | Status: DC | PRN

## 2022-06-25 NOTE — Discharge Summary (Signed)
DISCHARGE SUMMARY  Monica Hopkins  MR#: [id number]  DOB:01-May-1971  Date of Admission: 05/19/2022 Date of Discharge: 06/25/2022  Attending Physician:Peggye Poon Silvestre Gunner, MD  Patient's YQM:VHQION, Monica Rim, MD  Consults: PCCM   Disposition: D/C home   Follow-up Appts:  Follow-up Information     Kaleen Mask, MD Follow up.   Specialty: Family Medicine Contact information: [address] [place] [address] [phone]                 Tests Needing Follow-up: -f/u CXR indicated in 2 months to assure LLL infiltrate has fully resolved  -if trach stoma has not closed in 10 days a referral to ENT will be necessary   Discharge Diagnoses: PEA cardiac arrest due to hypoxia Streptococcus pneumonia with respiratory arrest status post tracheostomy Aspiration and healthcare associated Enterobacter Klebsiella and Stenotrophomonas pneumonia Abnormal CXR Newly diagnosed grade 1 diastolic CHF Hypotension Hypomagnesemia Hyponatremia Acute blood loss anemia on anemia of chronic disease Severe protein calorie malnutrition  Initial presentation: 51 year old who was brought to the hospital in cardiac arrest and hypothermic requiring intubation upon arrival in the ER.   Hospital Course: 10/17: admitted post cardiac arrest; ROSC 26 minutes 10/20: off pressors, will awaken agitated, desaturates SBT 10/23 failed extubation trial 10/25 trach 11/2 transferred to the [facility] service 11/2 she desaturated on trach collar and PCCM was reconsulted 11/3 Continues to fail at swallowing but resistant to PEG tube.  Consulted palliative care for further goals of care discussion and will also need a psychiatric evaluation 11/4 rapid response called given desaturation due to L mucous plugging and small effusion 11/5 Transferred to ICU after recurrent desaturation.  She was refusing chest vest, metaneb.   11/6 Tolerating trach collar, accepting meds, c/o pain 11/7 IR  consulted for PEG, unable to obtain due to anatomy.  General Surgery recommended conservative management, core track, diet as tolerated. 11/10 started on liquid diet.  Did poorly the first day, was noncompliant. 11/12-13 tolerating liquid diet  11/15 tolerating full liquids  11/21 calorie count confirms is able to maintain her nutrition orally - discontinue cortrak 11/22 trach decannulated   PEA cardiac arrest due to hypoxia Has recovered and is stable from this standpoint on room air   Streptococcus pneumonia with respiratory arrest status post tracheostomy Has completed antibiotic therapy - tracheostomy course was complicated by poor cough mechanics, deconditioning, and intermittent mucous plugging - SLP worked with Passy-Muir valve which patient tolerated well - PCCM reconsulted to consider decannulation 11/22, and this was successfully carried out with no complications for 24hrs afterward - pt instructed on care of former trach site by PCCM as well as TRH    Aspiration and healthcare associated Enterobacter Klebsiella and Stenotrophomonas pneumonia completed a course of antibiotic therapy - no persisting symptoms to suggest active infection at time of d/c    Abnormal CXR - Persistent LLL infiltrate  if infitlrate does not fully resolve within 2 months, needs CT to re-evaluate   Newly diagnosed grade 1 diastolic CHF Well compensated at time of d/c w/o need for specific medical tx    Hypotension Blood pressure stable   Hypomagnesemia Corrected with supplementation   Hyponatremia Resolved with volume resuscitation   Acute blood loss anemia on anemia of chronic disease Felt to represent upper GI bleed likely related to duodenal ulcer with history of same - no evidence of ongoing blood loss at d/c    Severe protein calorie malnutrition Calorie count has proven that she can now maintain  sufficient oral intake - encouraged to continue good oral intake at home   Allergies as of  06/25/2022       Reactions   Abilify [aripiprazole] Swelling, Rash   Lamictal [lamotrigine] Swelling, Rash   Latuda [lurasidone] Other (See Comments)   Caused leg weakness   Seroquel [quetiapine] Other (See Comments)   Insomnia, restlessness        Medication List     STOP taking these medications    diphenhydrAMINE 25 MG tablet Commonly known as: BENADRYL   divalproex 250 MG 24 hr tablet Commonly known as: Depakote ER   ibuprofen 200 MG tablet Commonly known as: ADVIL   Suboxone 8-2 MG Film Generic drug: Buprenorphine HCl-Naloxone HCl       TAKE these medications    acetaminophen 325 MG tablet Commonly known as: TYLENOL Take 2 tablets (650 mg total) by mouth 4 (four) times daily. What changed:  medication strength how much to take when to take this reasons to take this   clonazePAM 0.5 MG tablet Commonly known as: KLONOPIN Take one tab twice a daily and 3rd needed for severe anxiety What changed:  how much to take how to take this when to take this reasons to take this additional instructions   gabapentin 300 MG capsule Commonly known as: NEURONTIN Take 1 capsule (300 mg total) by mouth 2 (two) times daily.   HYDROmorphone 4 MG tablet Commonly known as: DILAUDID Take 1 tablet (4 mg total) by mouth every 3 (three) hours as needed for severe pain or moderate pain.   metoprolol tartrate 25 MG tablet Commonly known as: LOPRESSOR Take 0.5 tablets (12.5 mg total) by mouth 2 (two) times daily.   oxyCODONE 20 mg 12 hr tablet Commonly known as: OXYCONTIN Take 1 tablet (20 mg total) by mouth every 12 (twelve) hours.   polyethylene glycol 17 g packet Commonly known as: MIRALAX / GLYCOLAX Take 17 g by mouth daily as needed for moderate constipation.   Proventil HFA 108 (90 Base) MCG/ACT inhaler Generic drug: albuterol Inhale 1-2 puffs into the lungs every 6 (six) hours as needed for wheezing or shortness of breath.   senna-docusate 8.6-50 MG  tablet Commonly known as: Senokot-S Take 1 tablet by mouth at bedtime.   traZODone 50 MG tablet Commonly known as: DESYREL Take 1 tablet (50 mg total) by mouth at bedtime as needed for sleep. What changed:  medication strength how much to take how to take this when to take this reasons to take this additional instructions   valproic acid 250 MG capsule Commonly known as: DEPAKENE Take 2 capsules (500 mg total) by mouth 2 (two) times daily.               Durable Medical Equipment  (From admission, onward)           Start     Ordered   06/24/22 1511  For home use only DME 4 wheeled rolling walker with seat  Once       Question:  Patient needs a walker to treat with the following condition  Answer:  Weakness generalized   06/24/22 1511   06/24/22 1511  For home use only DME Bedside commode  Once       Question:  Patient needs a bedside commode to treat with the following condition  Answer:  Weakness generalized   06/24/22 1511            Day of Discharge BP (!) 90/57 (BP Location: Left  Arm)   Pulse 81   Temp 98.3 F (36.8 C) (Oral)   Resp 16   Ht 5\' 6"  (1.676 m)   Wt 47.8 kg   SpO2 100%   BMI 17.01 kg/m   Physical Exam: General: No acute respiratory distress Lungs: Clear to auscultation bilaterally without wheezes or crackles - trach stoma site appropriately covered and benign appearing  Cardiovascular: Regular rate and rhythm without murmur gallop or rub normal S1 and S2 Abdomen: Nontender, nondistended, soft, bowel sounds positive, no rebound, no ascites, no appreciable mass Extremities: No significant cyanosis, clubbing, or edema bilateral lower extremities  Basic Metabolic Panel: Recent Labs  Lab 06/20/22 0117 06/21/22 0116 06/22/22 0126 06/23/22 0827 06/24/22 0146 06/25/22 0046  NA 136 137 136 138 137 135  K 4.7 4.1 3.9 4.0 4.0 4.0  CL 99 100 98 99 99 97*  CO2 29 29 29  32 31 30  GLUCOSE 109* 123* 107* 102* 111* 116*  BUN 57* 53* 47*  33* 37* 33*  CREATININE 0.69 0.53 0.45 0.48 0.43* 0.63  CALCIUM 9.5 9.5 9.4 9.7 9.4 9.1  MG 1.9 1.8 2.0 1.9 1.8  --   PHOS 3.4 4.5 4.0 4.2 3.6  --     Liver Function Tests: Recent Labs  Lab 06/20/22 0117 06/21/22 0116 06/22/22 0126 06/23/22 0827 06/24/22 0146  AST 22 35 34 30 23  ALT 18 30 33 26 23  ALKPHOS 72 70 64 64 70  BILITOT 0.3 0.2* 0.3 0.2* 0.3  PROT 5.7* 5.6* 5.5* 6.3* 5.8*  ALBUMIN 2.3* 2.4* 2.4* 2.6* 2.4*    CBC: Recent Labs  Lab 06/20/22 0117 06/21/22 0116 06/22/22 0126 06/23/22 0827 06/24/22 0146 06/25/22 0046  WBC 7.3 6.8 6.2 4.4 6.2 8.8  NEUTROABS 4.0 3.2 2.7 2.3 2.2  --   HGB 8.0* 7.9* 8.0* 9.1* 8.2* 7.8*  HCT 24.9* 24.8* 25.4* 28.8* 25.6* 24.7*  MCV 99.6 101.2* 102.8* 101.8* 100.8* 101.2*  PLT 227 232 227 260 271 264   Time spent in discharge (includes decision making & examination of pt): 35 minutes  06/25/2022, 2:15 PM   Cherene Altes, MD [facility] Office  [phone]

## 2022-06-25 NOTE — Progress Notes (Signed)
Speech Language Pathology Treatment: Dysphagia  Patient Details Name: Monica Hopkins MRN: [id number] DOB: [birth year] Today's Date: 06/25/2022 Time: 7711-6579 SLP Time Calculation (min) (ACUTE ONLY): 13 min  Assessment / Plan / Recommendation Clinical Impression  Monica Hopkins was decannulated yesterday. Voice is strong with only occasional air leak through stoma when coughing. Her swallowing has improved dramatically from clinical observation alone. Laryngeal elevation is palpable; she no longer requires multiple sub-swallows to transition bolus. There are no s/s of aspiration and pharyngeal transition is likely greatly improved per observation and her report. She should be able to eat according to her preferences when she returns home. She has D/C orders for today.  No SLP f/u is needed. All goals met.  Our service will sign off.   HPI HPI: Pt is a 51 y.o. female admitted 05/19/22 after cardiac arrest at home, ROSC 26 min, hypothermic. ETT 10/17-10/23; reintubated 10/23-trach10/25. Pt with new HFpEF. MRI brain negative for acute changes. CXR 10/30: Stable multifocal pulmonary infiltrates and small left pleural effusion. MBS 11/3 rx NPO; G-tube being considered and IR consulted, but per radiology, anatomy is highly unfavorable and a relative contraindication. PMH: bipolar disorder.      SLP Plan  All goals met      Recommendations for follow up therapy are one component of a multi-disciplinary discharge planning process, led by the attending physician.  Recommendations may be updated based on patient status, additional functional criteria and insurance authorization.    Recommendations  Diet recommendations: Regular;Thin liquid Liquids provided via: Cup;Straw Supervision: Patient able to self feed                Oral Care Recommendations: Oral care BID Follow Up Recommendations: No SLP follow up Plan: All goals met         Monica Pfefferle L. Tivis Ringer, MA CCC/SLP Clinical Specialist -  Acute Care SLP Acute Rehabilitation Services Office number [phone]   Juan Quam Laurice  06/25/2022, 3:40 PM

## 2022-06-25 NOTE — Progress Notes (Signed)
Patient given discharge instructions, medication list and prescriptions sent to personal pharmacy. IV and tele were dcd. Will discharge home as ordered.Darolyn Double, Randall An RN

## 2022-06-25 NOTE — TOC Transition Note (Signed)
Transition of Care [facility]) - CM/SW Discharge Note   Patient Details  Name: Monica Hopkins MRN: [id number] Date of Birth: 01-01-1971  Transition of Care [facility]) CM/SW Contact:  Glennon Mac, RN Phone Number: 06/25/2022, 3:13 PM   Clinical Narrative:    Patient medically stable for discharge home today with daughter, Charlotte Sanes.  DC address is [address], [place], [place] [address].  RW and bedside commode delivered to bedside, per [employer]/Char[employer].  Referral to [facility], who will evaluate for charity HHPT/OT and follow up with TOC/patient.  Patient's family to transport home.    Final next level of care: Home w Home Health Services Barriers to Discharge: Barriers Resolved   Patient Goals and CMS Choice Patient states their goals for this hospitalization and ongoing recovery are:: return home w/ daughter/family CMS Medicare.gov Compare Post Acute Care list provided to:: Patient Choice offered to / list presented to : Patient [facility] referral)                        Discharge Plan and Services In-house Referral: Clinical Social Work Discharge Planning Services: CM Consult Post Acute Care Choice: Home Health, Durable Medical Equipment          DME Arranged: Bedside commode, Walker rolling DME Agency: [employer] Date DME Agency Contacted: 06/25/22 Time DME Agency Contacted: [phone] Representative spoke with at DME Agency: TOC delivered HH Arranged: PT, OT HH Agency: [facility] ([facility] Date HH Agency Contacted: 06/25/22 Time HH Agency Contacted: 1513 Representative spoke with at [facility] Agency: Duwaine Maxin  Social Determinants of Health (SDOH) Interventions     Readmission Risk Interventions     No data to display         Quintella Baton, RN, BSN  Trauma/Neuro ICU Case Manager [phone]

## 2022-06-25 NOTE — Discharge Instructions (Signed)
If your tracheostomy stoma (the hole where the tracheostomy used to be) is not completely closed after ~ 10 days your primary care provider will need to refer you to an ENT doctor to be seen.

## 2022-06-25 NOTE — Progress Notes (Signed)
Decannulated yesterday  Phonation quality excellent No issues from breathing stand-point Plan/rec Keep trach stoma covered until completely healed If stoma not completely closed after ~ 10d would need ENT eval for tracheocutaneous fistula We will s/o call PRN  Simonne Martinet ACNP-BC [facility] Pulmonary/Critical Care Pager # [phone] OR # [phone] if no answer

## 2022-06-25 NOTE — Progress Notes (Signed)
Mobility Specialist Progress Note:   06/25/22 1345  Mobility  Activity Ambulated with assistance in hallway  Level of Assistance Contact guard assist, steadying assist  Assistive Device Four wheel walker  Distance Ambulated (ft) 500 ft  Activity Response Tolerated well  $Mobility charge 1 Mobility   Pt received in bed willing to participate in mobility. Complaints of feeling tired. Left in bed with call bell in reach and all needs met.   Gareth Eagle Jaala Bohle Mobility Specialist Please contact via [employer] or  Rehab Office at [phone]

## 2022-06-26 NOTE — Progress Notes (Signed)
Post discharge call received from [place] with [facility] on 06/26/22 1000- they will be unable to accept charity referral for [facility] services- branch office in [place] that would service [place] where pt is staying with daughter does not have available staffing to service.

## 2022-07-02 ENCOUNTER — Telehealth (HOSPITAL_COMMUNITY): Payer: Self-pay | Admitting: Psychiatry

## 2022-09-02 ENCOUNTER — Other Ambulatory Visit (HOSPITAL_COMMUNITY): Payer: Self-pay | Admitting: Psychiatry

## 2022-09-02 DIAGNOSIS — F3131 Bipolar disorder, current episode depressed, mild: Secondary | ICD-10-CM

## 2023-01-17 ENCOUNTER — Emergency Department (HOSPITAL_COMMUNITY)
Admission: EM | Admit: 2023-01-17 | Discharge: 2023-01-17 | Disposition: A | Discharge status: Home / Self Care | Payer: Medicaid Other | Attending: Student | Admitting: Student

## 2023-01-17 ENCOUNTER — Other Ambulatory Visit: Payer: Self-pay

## 2023-01-17 ENCOUNTER — Emergency Department (HOSPITAL_COMMUNITY): Payer: Medicaid Other

## 2023-01-17 ENCOUNTER — Encounter (HOSPITAL_COMMUNITY): Payer: Self-pay

## 2023-01-17 DIAGNOSIS — K047 Periapical abscess without sinus: Secondary | ICD-10-CM

## 2023-01-17 DIAGNOSIS — F17210 Nicotine dependence, cigarettes, uncomplicated: Secondary | ICD-10-CM | POA: Insufficient documentation

## 2023-01-17 DIAGNOSIS — K0889 Other specified disorders of teeth and supporting structures: Secondary | ICD-10-CM | POA: Diagnosis present

## 2023-01-17 LAB — COMPREHENSIVE METABOLIC PANEL
ALT: 11 U/L (ref 0–44)
AST: 12 U/L — ABNORMAL LOW (ref 15–41)
Albumin: 3.6 g/dL (ref 3.5–5.0)
Alkaline Phosphatase: 66 U/L (ref 38–126)
Anion gap: 11 (ref 5–15)
BUN: 13 mg/dL (ref 6–20)
CO2: 27 mmol/L (ref 22–32)
Calcium: 9.2 mg/dL (ref 8.9–10.3)
Chloride: 100 mmol/L (ref 98–111)
Creatinine, Ser: 0.7 mg/dL (ref 0.44–1.00)
GFR, Estimated: >60 mL/min (ref >60)
Glucose, Bld: 98 mg/dL (ref 70–99)
Potassium: 3.9 mmol/L (ref 3.5–5.1)
Sodium: 138 mmol/L (ref 135–145)
Total Bilirubin: 0.6 mg/dL (ref 0.3–1.2)
Total Protein: 6.2 g/dL — ABNORMAL LOW (ref 6.5–8.1)

## 2023-01-17 LAB — CBC WITH DIFFERENTIAL/PLATELET
Abs Immature Granulocytes: 0.01 10*3/uL (ref 0.00–0.07)
Basophils Absolute: 0.1 10*3/uL (ref 0.0–0.1)
Basophils Relative: 1 %
Eosinophils Absolute: 0.5 10*3/uL (ref 0.0–0.5)
Eosinophils Relative: 5 %
HCT: 40.8 % (ref 36.0–46.0)
Hemoglobin: 13.1 g/dL (ref 12.0–15.0)
Immature Granulocytes: 0 %
Lymphocytes Relative: 52 %
Lymphs Abs: 4.7 10*3/uL — ABNORMAL HIGH (ref 0.7–4.0)
MCH: 29.6 pg (ref 26.0–34.0)
MCHC: 32.1 g/dL (ref 30.0–36.0)
MCV: 92.1 fL (ref 80.0–100.0)
Monocytes Absolute: 0.6 10*3/uL (ref 0.1–1.0)
Monocytes Relative: 6 %
Neutro Abs: 3.3 10*3/uL (ref 1.7–7.7)
Neutrophils Relative %: 36 %
Platelets: 209 10*3/uL (ref 150–400)
RBC: 4.43 MIL/uL (ref 3.87–5.11)
RDW: 14.1 % (ref 11.5–15.5)
WBC: 9.1 10*3/uL (ref 4.0–10.5)
nRBC: 0 % (ref 0.0–0.2)

## 2023-01-17 LAB — MAGNESIUM: Magnesium: 1.8 mg/dL (ref 1.7–2.4)

## 2023-01-17 MED ORDER — LIDOCAINE VISCOUS HCL 2 % MT SOLN: 15.0000 mL | OROMUCOSAL | 0 refills | Status: DC | PRN

## 2023-01-17 MED ORDER — KETOROLAC TROMETHAMINE 15 MG/ML IJ SOLN
15.0000 mg | Freq: Once | INTRAMUSCULAR | Status: AC
  Administered 2023-01-17: 15 mg via INTRAVENOUS
  Filled 2023-01-17: qty 1

## 2023-01-17 MED ORDER — IOHEXOL 350 MG/ML SOLN
75.0000 mL | Freq: Once | INTRAVENOUS | Status: AC | PRN
  Administered 2023-01-17: 75 mL via INTRAVENOUS

## 2023-01-17 MED ORDER — MORPHINE SULFATE 15 MG PO TABS: 15.0000 mg | ORAL_TABLET | Freq: Once | ORAL | Status: DC

## 2023-01-17 MED ORDER — SODIUM CHLORIDE 0.9 % IV SOLN
3.0000 g | Freq: Once | INTRAVENOUS | Status: AC
  Administered 2023-01-17: 3 g via INTRAVENOUS
  Filled 2023-01-17: qty 8

## 2023-01-17 MED ORDER — ACETAMINOPHEN 500 MG PO TABS
1000.0000 mg | ORAL_TABLET | Freq: Once | ORAL | Status: AC
  Administered 2023-01-17: 1000 mg via ORAL
  Filled 2023-01-17: qty 2

## 2023-01-17 MED ORDER — CLINDAMYCIN HCL 150 MG PO CAPS: 150.0000 mg | ORAL_CAPSULE | Freq: Four times a day (QID) | ORAL | 0 refills | Status: DC

## 2023-01-17 MED ORDER — OXYCODONE HCL 5 MG PO TABS
5.0000 mg | ORAL_TABLET | Freq: Once | ORAL | Status: AC
  Administered 2023-01-17: 5 mg via ORAL
  Filled 2023-01-17: qty 1

## 2023-01-17 MED ORDER — ACETAMINOPHEN 500 MG PO TABS: 1000.0000 mg | ORAL_TABLET | Freq: Three times a day (TID) | ORAL | 0 refills | Status: AC

## 2023-01-17 NOTE — ED Provider Notes (Signed)
[place] EMERGENCY DEPARTMENT AT [facility] Provider Note   HPI: Monica Hopkins is a 52 year old female with a past medical history as below also notable for cardiac arrest from hypoxia presenting today with dental pain.  She reports the right lower mandible has been hurting her.  She has tried antibiotics in the past multiple times without relief.  She follows with a dentist and her primary care doctor.  Her primary care doctor reports that if the antibiotics did not resolve the infection she needed to be evaluated.  She has not been having fevers or chills.  She does endorse some pain with swallowing and tenderness on the underside of her jaw.  She does not have difficulty opening her mouth.  She has been able to eat but reports concerns regarding her malnutrition she has had in the past.  Past Medical History:  Diagnosis Date   Prolapse, disk     Past Surgical History:  Procedure Laterality Date   NECK SURGERY       Social History   Tobacco Use   Smoking status: Every Day    Packs/day: 1.00    Years: 9.00    Additional pack years: 0.00    Total pack years: 9.00    Types: Cigarettes   Smokeless tobacco: Never   Tobacco comments:    still trying to cut back and quit on her own.   Vaping Use   Vaping Use: Never used  Substance Use Topics   Alcohol use: No   Drug use: No      Review of Systems  A complete ROS was performed with pertinent positives/negatives noted in the HPI.   Vitals:   01/17/23 1322 01/17/23 1927  BP: (!) 132/91 (!) 140/82  Pulse: 86 79  Resp: 18 16  Temp: 98.1 F (36.7 C) 98.1 F (36.7 C)  SpO2: 98% 99%    Physical Exam Vitals and nursing note reviewed.  Constitutional:      General: She is not in acute distress.    Appearance: She is well-developed. She is not ill-appearing.  HENT:     Head: Normocephalic and atraumatic.     Mouth/Throat:     Comments: Poor dentition.  No dental abscess.  No peritonsillar abscess with uvula  midline.  No trismus.  No drooling.  Tenderness to palpation of the submental space which is soft.  Sublingual space is soft. Pulmonary:     Effort: Pulmonary effort is normal. No respiratory distress.  Abdominal:     General: Abdomen is flat. There is no distension.  Skin:    General: Skin is warm and dry.     Capillary Refill: Capillary refill takes less than 2 seconds.  Neurological:     Mental Status: She is alert.  Psychiatric:        Mood and Affect: Mood normal.     Procedures  MDM:  Imaging/radiology results:  CT Soft Tissue Neck W Contrast  Result Date: 01/17/2023 CLINICAL DATA:  Anterior neck pain.  Pain with swallowing. EXAM: CT NECK WITH CONTRAST TECHNIQUE: Multidetector CT imaging of the neck was performed using the standard protocol following the bolus administration of intravenous contrast. RADIATION DOSE REDUCTION: This exam was performed according to the departmental dose-optimization program which includes automated exposure control, adjustment of the mA and/or kV according to patient size and/or use of iterative reconstruction technique. CONTRAST:  75mL OMNIPAQUE IOHEXOL 350 MG/ML SOLN COMPARISON:  CT neck with contrast 03/14/2021 FINDINGS: Pharynx and larynx: Mild  adenoid hypertrophy is present. The nasopharynx is otherwise clear. The soft palate and tongue base are within normal limits. The palatine tonsils are within normal limits bilaterally. Vallecula and epiglottis are within normal limits. Aryepiglottic folds and piriform sinuses are clear. Vocal cords are midline and symmetric. Trachea is clear. Salivary glands: The submandibular and parotid glands and ducts are within normal limits. Thyroid: Normal Lymph nodes: Reactive level 2 lymph nodes are present bilaterally similar to the prior exam. Necrotic or rounded nodes are present. Vascular: No significant vascular calcifications or stenoses are present. Limited intracranial: Within normal limits. Visualized orbits: The  globes and orbits are within normal limits. Mastoids and visualized paranasal sinuses: Chronic wall thickening is present the maxillary sinuses bilaterally. Maxillary sinuses are shrunken. No active disease is present. The paranasal sinuses and mastoid air cells are otherwise clear. Skeleton: Solid fusion is present at C5-6. Vertebral body heights and alignment are normal. Multiple dental caries have progressed since the prior exam. Periapical lucencies are present about the roots of the mandibular molars bilaterally. Impacted, unerupted right third molar is noted. Upper chest: The lung apices are clear. The thoracic inlet is within normal limits. IMPRESSION: 1. Mild adenoid hypertrophy and reactive level 2 lymph nodes bilaterally are similar to the prior exam. This could be related to pharyngitis. 2. No other acute or focal lesion to explain the patient's symptoms. 3. Solid fusion at C5-6. 4. Multiple dental caries have progressed since the prior exam. 5. Periapical lucencies about the roots of the mandibular molars bilaterally compatible with periapical abscesses. Electronically Signed   By: Marin Roberts M.D.   On: 01/17/2023 18:03       Lab results:  Results for orders placed or performed during the hospital encounter of 01/17/23 (from the past 24 hour(s))  CBC with Differential     Status: Abnormal   Collection Time: 01/17/23  2:09 PM  Result Value Ref Range   WBC 9.1 4.0 - 10.5 K/uL   RBC 4.43 3.87 - 5.11 MIL/uL   Hemoglobin 13.1 12.0 - 15.0 g/dL   HCT 57.8 46.9 - 62.9 %   MCV 92.1 80.0 - 100.0 fL   MCH 29.6 26.0 - 34.0 pg   MCHC 32.1 30.0 - 36.0 g/dL   RDW 52.8 41.3 - 24.4 %   Platelets 209 150 - 400 K/uL   nRBC 0.0 0.0 - 0.2 %   Neutrophils Relative % 36 %   Neutro Abs 3.3 1.7 - 7.7 K/uL   Lymphocytes Relative 52 %   Lymphs Abs 4.7 (H) 0.7 - 4.0 K/uL   Monocytes Relative 6 %   Monocytes Absolute 0.6 0.1 - 1.0 K/uL   Eosinophils Relative 5 %   Eosinophils Absolute 0.5 0.0 -  0.5 K/uL   Basophils Relative 1 %   Basophils Absolute 0.1 0.0 - 0.1 K/uL   Immature Granulocytes 0 %   Abs Immature Granulocytes 0.01 0.00 - 0.07 K/uL  Comprehensive metabolic panel     Status: Abnormal   Collection Time: 01/17/23  2:09 PM  Result Value Ref Range   Sodium 138 135 - 145 mmol/L   Potassium 3.9 3.5 - 5.1 mmol/L   Chloride 100 98 - 111 mmol/L   CO2 27 22 - 32 mmol/L   Glucose, Bld 98 70 - 99 mg/dL   BUN 13 6 - 20 mg/dL   Creatinine, Ser 0.10 0.44 - 1.00 mg/dL   Calcium 9.2 8.9 - 27.2 mg/dL   Total Protein 6.2 (L) 6.5 -  8.1 g/dL   Albumin 3.6 3.5 - 5.0 g/dL   AST 12 (L) 15 - 41 U/L   ALT 11 0 - 44 U/L   Alkaline Phosphatase 66 38 - 126 U/L   Total Bilirubin 0.6 0.3 - 1.2 mg/dL   GFR, Estimated >69 >62 mL/min   Anion gap 11 5 - 15  Magnesium     Status: None   Collection Time: 01/17/23  2:09 PM  Result Value Ref Range   Magnesium 1.8 1.7 - 2.4 mg/dL     Key medications administered in the ER:  Medications  acetaminophen (TYLENOL) tablet 1,000 mg (1,000 mg Oral Given 01/17/23 1529)  ketorolac (TORADOL) 15 MG/ML injection 15 mg (15 mg Intravenous Given 01/17/23 1537)  iohexol (OMNIPAQUE) 350 MG/ML injection 75 mL (75 mLs Intravenous Contrast Given 01/17/23 1708)  oxyCODONE (Oxy IR/ROXICODONE) immediate release tablet 5 mg (5 mg Oral Given 01/17/23 1833)  Ampicillin-Sulbactam (UNASYN) 3 g in sodium chloride 0.9 % 100 mL IVPB (0 g Intravenous Stopped 01/17/23 1914)    Medical decision making: -Vital signs stable. Patient afebrile, hemodynamically stable, and non-toxic appearing. -Patient's presentation is most consistent with acute complicated illness / injury requiring diagnostic workup.. Monica Hopkins is a 52 y.o. female presenting to the emergency department with concerns of a dental infection.  -Additional history obtained from patient's significant other at bedside. -Patient given Tylenol and Toradol for pain. -Patient is presenting with concerns of a dental  infection.  On my exam there is no trismus, drooling, or evidence of peritonsillar abscess.  There is no evidence of easily accessible and drainable dental abscess.  Patient has poor dentition.  She has no trismus.  Her submental space is soft but tender therefore will obtain CT to further evaluate for deeper space infection.  CT does not show evidence of Ludwig's angina.  Labs also obtained and CBC is without acute hematologic abnormality including no leukocytosis.  CMP does not show evidence of metabolic derangement and magnesium is normal. Patient does not have neck pain, do not suspect LaMere syndrome.  Patient's vitals are stable and she is nontoxic-appearing.  Given dose of Unasyn and patient reporting worsening pain therefore given dose of oxycodone.  CT does show evidence of dental caries and small periapical abscesses at the roots.  Given she has been hemodynamically stable emergency department patient is stable for dentistry and PCP follow-up.  Will plan to switch antibiotics to clindamycin.  Discussed pain control regimen of Tylenol and lidocaine.  Patient reports having oxycodone at home.  I discussed strict return precautions for ED return.  Discussed follow-up plan.  Patient discharged.   Medical Decision Making Amount and/or Complexity of Data Reviewed Labs: ordered. Decision-making details documented in ED Course. Radiology: ordered. Decision-making details documented in ED Course.  Risk OTC drugs. Prescription drug management.     The plan for this patient was discussed with Dr. Posey Rea, who voiced agreement and who oversaw evaluation and treatment of this patient.  Marta Lamas, MD Emergency Medicine, PGY-3  Note: Dragon medical dictation software was used in the creation of this note.   Clinical Impression:  1. Periapical abscess     Rx / DC Orders ED Discharge Orders          Ordered    acetaminophen (TYLENOL) 500 MG tablet  Every 8 hours        01/17/23 1923     lidocaine (XYLOCAINE) 2 % solution  Every 4 hours PRN  01/17/23 1923    clindamycin (CLEOCIN) 150 MG capsule  Every 6 hours        01/17/23 1923               Chase Caller, MD 01/17/23 2225    Glendora Score, MD 01/18/23 1438

## 2023-01-17 NOTE — ED Triage Notes (Addendum)
Pt came in via POV d/t over 3 years ago she has been dealing with abscessed teeth & the pain has her unable to eat as usual & she has been losing too much weight plus intermittently feeling dizzy. Pt reports going into Cardiac Arrest d/t malnutrition back in October, 2023 & her teeth are still bothering after ABT Tx & seeing a dentist. She is now scheduled for dental surgery on July 2nd but she is having so much pain & still losing weight she is very anxious & scared she will become malnourished again (per pt). A/Ox4, rates her pain 10/10, pt very jittery & tearful while in triage.

## 2023-05-06 ENCOUNTER — Emergency Department (HOSPITAL_COMMUNITY)
Admission: EM | Admit: 2023-05-06 | Discharge: 2023-05-06 | Disposition: A | Discharge status: Home / Self Care | Payer: Medicaid Other | Attending: Emergency Medicine | Admitting: Emergency Medicine

## 2023-05-06 ENCOUNTER — Other Ambulatory Visit: Payer: Self-pay

## 2023-05-06 DIAGNOSIS — F1120 Opioid dependence, uncomplicated: Secondary | ICD-10-CM | POA: Insufficient documentation

## 2023-05-06 LAB — COMPREHENSIVE METABOLIC PANEL
ALT: 12 U/L (ref 0–44)
AST: 13 U/L — ABNORMAL LOW (ref 15–41)
Albumin: 3.6 g/dL (ref 3.5–5.0)
Alkaline Phosphatase: 55 U/L (ref 38–126)
Anion gap: 10 (ref 5–15)
BUN: 17 mg/dL (ref 6–20)
CO2: 29 mmol/L (ref 22–32)
Calcium: 9.1 mg/dL (ref 8.9–10.3)
Chloride: 101 mmol/L (ref 98–111)
Creatinine, Ser: 0.76 mg/dL (ref 0.44–1.00)
GFR, Estimated: >60 mL/min (ref >60)
Glucose, Bld: 122 mg/dL — ABNORMAL HIGH (ref 70–99)
Potassium: 4.1 mmol/L (ref 3.5–5.1)
Sodium: 140 mmol/L (ref 135–145)
Total Bilirubin: 0.4 mg/dL (ref 0.3–1.2)
Total Protein: 5.8 g/dL — ABNORMAL LOW (ref 6.5–8.1)

## 2023-05-06 LAB — CBC WITH DIFFERENTIAL/PLATELET
Abs Immature Granulocytes: 0.02 10*3/uL (ref 0.00–0.07)
Basophils Absolute: 0.1 10*3/uL (ref 0.0–0.1)
Basophils Relative: 1 %
Eosinophils Absolute: 0.2 10*3/uL (ref 0.0–0.5)
Eosinophils Relative: 3 %
HCT: 39.2 % (ref 36.0–46.0)
Hemoglobin: 12.7 g/dL (ref 12.0–15.0)
Immature Granulocytes: 0 %
Lymphocytes Relative: 39 %
Lymphs Abs: 3.4 10*3/uL (ref 0.7–4.0)
MCH: 30.2 pg (ref 26.0–34.0)
MCHC: 32.4 g/dL (ref 30.0–36.0)
MCV: 93.3 fL (ref 80.0–100.0)
Monocytes Absolute: 0.5 10*3/uL (ref 0.1–1.0)
Monocytes Relative: 6 %
Neutro Abs: 4.5 10*3/uL (ref 1.7–7.7)
Neutrophils Relative %: 51 %
Platelets: 202 10*3/uL (ref 150–400)
RBC: 4.2 MIL/uL (ref 3.87–5.11)
RDW: 12.7 % (ref 11.5–15.5)
WBC: 8.7 10*3/uL (ref 4.0–10.5)
nRBC: 0 % (ref 0.0–0.2)

## 2023-05-06 NOTE — ED Triage Notes (Signed)
Patient reports elevated blood pressure this evening at home with irregular heart rate , she adds fatigue and poor appetite .

## 2023-05-06 NOTE — ED Provider Notes (Signed)
[place] EMERGENCY DEPARTMENT AT [facility] Provider Note   CSN: [id number] Arrival date & time: 05/06/23  2054     History  Chief Complaint  Patient presents with   Hypertension / Irregualr HR     Monica Hopkins is a 52 y.o. female.  52 yo F with a chief complaints of difficulty managing her chronic pain.  She tells me that she suffered a cardiac arrest about a year ago.  She said that was due to severe malnutrition.  Since then she has felt pain from her head to her toe.  She has tried different narcotic regimens.  At 1 point was on Suboxone currently takes Dilaudid tablets.  She has an appointment with her primary care doctor tomorrow.  They are here because she is not sure what else to do.  She feels there must be some medication that would help her with her discomfort.  She also feels like her blood pressure is either high or low.  She checks it every 5 minutes at home.  Tells me that it is drastically different every time she checks it.        Home Medications Prior to Admission medications   Medication Sig Start Date End Date Taking? Authorizing Provider  albuterol (PROVENTIL HFA) 108 (90 Base) MCG/ACT inhaler Inhale 1-2 puffs into the lungs every 6 (six) hours as needed for wheezing or shortness of breath.    [provider]  clindamycin (CLEOCIN) 150 MG capsule Take 1 capsule (150 mg total) by mouth every 6 (six) hours. 01/17/23   Kommor, Madison, MD  clonazePAM (KLONOPIN) 0.5 MG tablet Take one tab twice a daily and 3rd needed for severe anxiety Patient taking differently: Take 0.5 mg by mouth 2 (two) times daily as needed for anxiety. May take 1 additional tablet if needed for anxiety. 03/10/22   Arfeen, Phillips Grout, MD  gabapentin (NEURONTIN) 300 MG capsule Take 1 capsule (300 mg total) by mouth 2 (two) times daily. 06/25/22   Lonia Blood, MD  HYDROmorphone (DILAUDID) 4 MG tablet Take 1 tablet (4 mg total) by mouth every 3 (three) hours as needed for  severe pain or moderate pain. 06/25/22   Lonia Blood, MD  lidocaine (XYLOCAINE) 2 % solution Use as directed 15 mLs in the mouth or throat every 4 (four) hours as needed for mouth pain (swish and spit). 01/17/23   Kommor, Madison, MD  metoprolol tartrate (LOPRESSOR) 25 MG tablet Take 0.5 tablets (12.5 mg total) by mouth 2 (two) times daily. 06/25/22   Lonia Blood, MD  oxyCODONE (OXYCONTIN) 20 mg 12 hr tablet Take 1 tablet (20 mg total) by mouth every 12 (twelve) hours. 06/25/22   Lonia Blood, MD  polyethylene glycol (MIRALAX / GLYCOLAX) 17 g packet Take 17 g by mouth daily as needed for moderate constipation. 06/25/22   Lonia Blood, MD  senna-docusate (SENOKOT-S) 8.6-50 MG tablet Take 1 tablet by mouth at bedtime. 06/25/22   Lonia Blood, MD  traZODone (DESYREL) 50 MG tablet Take 1 tablet (50 mg total) by mouth at bedtime as needed for sleep. 06/25/22   Lonia Blood, MD  valproic acid (DEPAKENE) 250 MG capsule Take 2 capsules (500 mg total) by mouth 2 (two) times daily. 06/25/22   Lonia Blood, MD      Allergies    Abilify [aripiprazole], Lamictal [lamotrigine], Latuda [lurasidone], and Seroquel [quetiapine]    Review of Systems   Review of Systems  Physical  Exam Updated Vital Signs BP 125/81 (BP Location: Right Arm)   Pulse 87   Temp 98.6 F (37 C)   Resp (!) 26   SpO2 97%  Physical Exam Vitals and nursing note reviewed.  Constitutional:      General: She is not in acute distress.    Appearance: She is well-developed. She is not diaphoretic.  HENT:     Head: Normocephalic and atraumatic.  Eyes:     Pupils: Pupils are equal, round, and reactive to light.  Cardiovascular:     Rate and Rhythm: Normal rate and regular rhythm.     Heart sounds: No murmur heard.    No friction rub. No gallop.  Pulmonary:     Effort: Pulmonary effort is normal.     Breath sounds: No wheezing or rales.  Abdominal:     General: There is no distension.      Palpations: Abdomen is soft.     Tenderness: There is no abdominal tenderness.  Musculoskeletal:        General: No tenderness.     Cervical back: Normal range of motion and neck supple.  Skin:    General: Skin is warm and dry.  Neurological:     Mental Status: She is alert and oriented to person, place, and time.  Psychiatric:        Behavior: Behavior normal.     ED Results / Procedures / Treatments   Labs (all labs ordered are listed, but only abnormal results are displayed) Labs Reviewed  COMPREHENSIVE METABOLIC PANEL - Abnormal; Notable for the following components:      Result Value   Glucose, Bld 122 (*)    Total Protein 5.8 (*)    AST 13 (*)    All other components within normal limits  CBC WITH DIFFERENTIAL/PLATELET    EKG EKG Interpretation Date/Time:  Thursday May 06 2023 21:08:26 EDT Ventricular Rate:  76 PR Interval:  156 QRS Duration:  96 QT Interval:  396 QTC Calculation: 445 R Axis:   -20  Text Interpretation: Normal sinus rhythm Low voltage QRS Septal infarct , age undetermined Abnormal ECG Since last tracing rate slower ** Nonstandard lead placement, ECG interpretation not available ** Confirmed by Melene Plan [phone]) on 05/06/2023 10:58:11 PM  Radiology No results found.  Procedures Procedures    Medications Ordered in ED Medications - No data to display  ED Course/ Medical Decision Making/ A&P                                 Medical Decision Making Amount and/or Complexity of Data Reviewed Labs: ordered.   52 yo F with what sounds like a chronic pain syndrome and opioid dependence comes in with a chief complaints of pain from her head to her toe and a sensation like she withdrawals from opioids every time she is a little bit late to take her medication.  She tells me that she has been kicked out of multiple pain clinics and most will see her.  She has been on multiple different narcotics including Suboxone oxycodone and Dilaudid.  She is  currently on a Dilaudid regiment.  She has been checking her blood pressure every 5 minutes and feels like it varies drastically.  She is worried that she may go into cardiac arrest again.  Patient had basic blood work done here, no anemia no leukocytosis no significant electrolyte abnormality.  Renal functions at  baseline.  EKG is not ischemic.  She has an appointment with her doctor tomorrow.  I encouraged her to bring up these complaints with her doctor.  See what else that he can do to help her.  11:40 PM:  I have discussed the diagnosis/risks/treatment options with the patient.  Evaluation and diagnostic testing in the emergency department does not suggest an emergent condition requiring admission or immediate intervention beyond what has been performed at this time.  They will follow up with PCP. We also discussed returning to the ED immediately if new or worsening sx occur. We discussed the sx which are most concerning (e.g., sudden worsening pain, fever, inability to tolerate by mouth) that necessitate immediate return. Medications administered to the patient during their visit and any new prescriptions provided to the patient are listed below.  Medications given during this visit Medications - No data to display   The patient appears reasonably screen and/or stabilized for discharge and I doubt any other medical condition or other [facility] requiring further screening, evaluation, or treatment in the ED at this time prior to discharge.          Final Clinical Impression(s) / ED Diagnoses Final diagnoses:  Opiate dependence, continuous [facility])    Rx / DC Orders ED Discharge Orders     None         Melene Plan, DO 05/06/23 2340

## 2023-05-29 ENCOUNTER — Other Ambulatory Visit: Payer: Self-pay

## 2023-05-29 ENCOUNTER — Emergency Department (HOSPITAL_COMMUNITY)
Admission: EM | Admit: 2023-05-29 | Discharge: 2023-05-29 | Discharge status: Against Medical Advice | Payer: Medicaid Other | Attending: Emergency Medicine | Admitting: Emergency Medicine

## 2023-05-29 ENCOUNTER — Encounter (HOSPITAL_COMMUNITY): Payer: Self-pay | Admitting: *Deleted

## 2023-05-29 DIAGNOSIS — K0889 Other specified disorders of teeth and supporting structures: Secondary | ICD-10-CM | POA: Diagnosis not present

## 2023-05-29 DIAGNOSIS — G894 Chronic pain syndrome: Secondary | ICD-10-CM | POA: Diagnosis not present

## 2023-05-29 DIAGNOSIS — R1310 Dysphagia, unspecified: Secondary | ICD-10-CM | POA: Diagnosis present

## 2023-05-29 LAB — COMPREHENSIVE METABOLIC PANEL
ALT: 9 U/L (ref 0–44)
AST: 9 U/L — ABNORMAL LOW (ref 15–41)
Albumin: 3.5 g/dL (ref 3.5–5.0)
Alkaline Phosphatase: 53 U/L (ref 38–126)
Anion gap: 10 (ref 5–15)
BUN: 13 mg/dL (ref 6–20)
CO2: 28 mmol/L (ref 22–32)
Calcium: 9.3 mg/dL (ref 8.9–10.3)
Chloride: 103 mmol/L (ref 98–111)
Creatinine, Ser: 0.74 mg/dL (ref 0.44–1.00)
GFR, Estimated: >60 mL/min (ref >60)
Glucose, Bld: 119 mg/dL — ABNORMAL HIGH (ref 70–99)
Potassium: 3.9 mmol/L (ref 3.5–5.1)
Sodium: 141 mmol/L (ref 135–145)
Total Bilirubin: 0.3 mg/dL (ref 0.3–1.2)
Total Protein: 5.8 g/dL — ABNORMAL LOW (ref 6.5–8.1)

## 2023-05-29 LAB — CBC
HCT: 39.8 % (ref 36.0–46.0)
Hemoglobin: 13 g/dL (ref 12.0–15.0)
MCH: 30.3 pg (ref 26.0–34.0)
MCHC: 32.7 g/dL (ref 30.0–36.0)
MCV: 92.8 fL (ref 80.0–100.0)
Platelets: 209 10*3/uL (ref 150–400)
RBC: 4.29 MIL/uL (ref 3.87–5.11)
RDW: 12.9 % (ref 11.5–15.5)
WBC: 8.5 10*3/uL (ref 4.0–10.5)
nRBC: 0 % (ref 0.0–0.2)

## 2023-05-29 LAB — URINALYSIS, ROUTINE W REFLEX MICROSCOPIC
Bilirubin Urine: NEGATIVE
Glucose, UA: NEGATIVE mg/dL
Hgb urine dipstick: NEGATIVE
Ketones, ur: NEGATIVE mg/dL
Leukocytes,Ua: NEGATIVE
Nitrite: NEGATIVE
Protein, ur: NEGATIVE mg/dL
Specific Gravity, Urine: 1.021 (ref 1.005–1.030)
pH: 6 (ref 5.0–8.0)

## 2023-05-29 LAB — RAPID URINE DRUG SCREEN, HOSP PERFORMED
Amphetamines: NOT DETECTED
Barbiturates: NOT DETECTED
Benzodiazepines: POSITIVE — AB
Cocaine: NOT DETECTED
Opiates: POSITIVE — AB
Tetrahydrocannabinol: NOT DETECTED

## 2023-05-29 LAB — LIPASE, BLOOD: Lipase: 25 U/L (ref 11–51)

## 2023-05-29 MED ORDER — ONDANSETRON HCL 4 MG/2ML IJ SOLN
4.0000 mg | Freq: Once | INTRAMUSCULAR | Status: AC
  Administered 2023-05-29: 4 mg via INTRAVENOUS
  Filled 2023-05-29: qty 2

## 2023-05-29 MED ORDER — MORPHINE SULFATE (PF) 4 MG/ML IV SOLN
4.0000 mg | Freq: Once | INTRAVENOUS | Status: AC
  Administered 2023-05-29: 4 mg via INTRAVENOUS
  Filled 2023-05-29: qty 1

## 2023-05-29 MED ORDER — SODIUM CHLORIDE 0.9 % IV BOLUS
1000.0000 mL | Freq: Once | INTRAVENOUS | Status: AC
  Administered 2023-05-29: 1000 mL via INTRAVENOUS

## 2023-05-29 NOTE — ED Notes (Signed)
Patient left the ER , PA notified.

## 2023-05-29 NOTE — ED Triage Notes (Signed)
The pt has been ill since July  he has much pain and she was seen at a pain clllinic   her throat has been swollen since July unable to eat not sleeping nauseated unsteady gait all teeth removed in July  diarrhea for 2 weeks no po intyake   she has been on numerus medicines fore pain and none seem to work

## 2023-05-29 NOTE — Discharge Instructions (Addendum)
As we discussed, your work-up in the ER today was reassuring for acute findings. Laboratory evaluation did not reveal any emergent cause of your symptoms. Given that you are having difficulty swallowing solid foods, I have given you a referral to GI with a number to call to schedule a follow-up appointment. Please call at your earliest convenience. In the interim, it is very important that you are consuming adequate caloric intake every day. This can be with soft foods, protein shakes, and smoothies. If you begin to have difficulty swallowing these, then you will need to return for emergent intervention.   Additionally, it is very important that you see a dentist to have your dental pain managed. Given that you are not satisfied with your current dentist, I have given you resources for others in the area you can call and schedule and appointment.  I have also given you resources for pain management specialists in the area to help manage your chronic pain.   Return if development of any new or worsening symptoms.

## 2023-05-29 NOTE — ED Provider Notes (Signed)
[place] EMERGENCY DEPARTMENT AT [facility] Provider Note   CSN: [id number] Arrival date & time: 05/29/23  1542     History  Chief Complaint  Patient presents with   multiple complaints    Monica Hopkins is a 52 y.o. female.  Patient with history of chronic pain syndrome, bipolar, cardiac arrest presents today with multiple complaints. She states that she has had difficulty getting her chronic pain under control despite being on 4 mg of oral dilaudid every 3 hours. States that she hurts 'from my head to my toes.' Notes this has been an ongoing problem for the last 1 year since she had her cardiac arrest. She has been managing this through her primary care doctor. She also notes that since she was hospitalized 1 year ago she has had poor dentition throughout her mouth. She states that she has been seeing a dentist for this and was told that she needed to have all her teeth removed and was told that this would happen when she had surgery in July, however when she woke up only a few teeth were pulled. She notes that her teeth have been continuing to cause her pain since this and her dentist now will not remove the rest of her teeth because she 'is too weak.' States that she has been having swollen lymph nodes bilaterally in her neck since June which has been attributed to her dentition. She also notes that she has had some difficulty tolerating solid foods since June. States that she is able to tolerate liquids, however the liquid hurts her teeth. She has to drink large amounts of liquid to get the solid food to go down. She has not seen anyone for this. She has never seen GI or had an endoscopy. States she does occasionally vomit when she swallows solid food if she doesn't chew enough or cut it into small enough pieces or drink enough liquid with the food. She has been drinking some protein shakes and smoothies for this, however is concerned that she is dehydrated. Notes she has lost  about 15 lbs since this started in June. She also notes a few bouts of nonbloody diarrhea for the last 2 weeks. No abdominal pain, fevers, or chills. States she has been seeing her pcp every 1-2 weeks for her complaints and is concerned that her symptoms seem to not be improving.   The history is provided by the patient. No language interpreter was used.       Home Medications Prior to Admission medications   Medication Sig Start Date End Date Taking? Authorizing Provider  albuterol (PROVENTIL HFA) 108 (90 Base) MCG/ACT inhaler Inhale 1-2 puffs into the lungs every 6 (six) hours as needed for wheezing or shortness of breath.    [provider]  clindamycin (CLEOCIN) 150 MG capsule Take 1 capsule (150 mg total) by mouth every 6 (six) hours. 01/17/23   Kommor, Madison, MD  clonazePAM (KLONOPIN) 0.5 MG tablet Take one tab twice a daily and 3rd needed for severe anxiety Patient taking differently: Take 0.5 mg by mouth 2 (two) times daily as needed for anxiety. May take 1 additional tablet if needed for anxiety. 03/10/22   Arfeen, Phillips Grout, MD  gabapentin (NEURONTIN) 300 MG capsule Take 1 capsule (300 mg total) by mouth 2 (two) times daily. 06/25/22   Lonia Blood, MD  HYDROmorphone (DILAUDID) 4 MG tablet Take 1 tablet (4 mg total) by mouth every 3 (three) hours as needed for severe pain  or moderate pain. 06/25/22   Lonia Blood, MD  lidocaine (XYLOCAINE) 2 % solution Use as directed 15 mLs in the mouth or throat every 4 (four) hours as needed for mouth pain (swish and spit). 01/17/23   Kommor, Madison, MD  metoprolol tartrate (LOPRESSOR) 25 MG tablet Take 0.5 tablets (12.5 mg total) by mouth 2 (two) times daily. 06/25/22   Lonia Blood, MD  oxyCODONE (OXYCONTIN) 20 mg 12 hr tablet Take 1 tablet (20 mg total) by mouth every 12 (twelve) hours. 06/25/22   Lonia Blood, MD  polyethylene glycol (MIRALAX / GLYCOLAX) 17 g packet Take 17 g by mouth daily as needed for moderate  constipation. 06/25/22   Lonia Blood, MD  senna-docusate (SENOKOT-S) 8.6-50 MG tablet Take 1 tablet by mouth at bedtime. 06/25/22   Lonia Blood, MD  traZODone (DESYREL) 50 MG tablet Take 1 tablet (50 mg total) by mouth at bedtime as needed for sleep. 06/25/22   Lonia Blood, MD  valproic acid (DEPAKENE) 250 MG capsule Take 2 capsules (500 mg total) by mouth 2 (two) times daily. 06/25/22   Lonia Blood, MD      Allergies    Abilify [aripiprazole], Lamictal [lamotrigine], Latuda [lurasidone], and Seroquel [quetiapine]    Review of Systems   Review of Systems  All other systems reviewed and are negative.   Physical Exam Updated Vital Signs BP 100/72 (BP Location: Right Arm)   Pulse 69   Temp 98.6 F (37 C) (Oral)   Resp 16   Ht 5\' 6"  (1.676 m)   Wt 47.8 kg   SpO2 97%   BMI 17.01 kg/m  Physical Exam Vitals and nursing note reviewed.  Constitutional:      General: She is not in acute distress.    Appearance: Normal appearance. She is normal weight. She is not ill-appearing, toxic-appearing or diaphoretic.  HENT:     Head: Normocephalic and atraumatic.     Mouth/Throat:     Pharynx: Oropharynx is clear. Uvula midline.     Tonsils: No tonsillar exudate or tonsillar abscesses.     Comments: Dentition poor throughout with multiple dental caries. No gross abscess or facial swelling. No signs of Ludwigs angina  Tolerating secretions without issue Eyes:     Extraocular Movements: Extraocular movements intact.     Pupils: Pupils are equal, round, and reactive to light.  Cardiovascular:     Rate and Rhythm: Normal rate and regular rhythm.     Heart sounds: Normal heart sounds.  Pulmonary:     Effort: Pulmonary effort is normal. No respiratory distress.     Breath sounds: Normal breath sounds.  Abdominal:     General: Abdomen is flat.     Palpations: Abdomen is soft.     Tenderness: There is no abdominal tenderness.  Musculoskeletal:        General:  Normal range of motion.     Cervical back: Normal range of motion and neck supple.  Lymphadenopathy:     Comments: Mildly enlarged tender submandibular lymph nodes bilaterally  Skin:    General: Skin is warm and dry.  Neurological:     General: No focal deficit present.     Mental Status: She is alert.  Psychiatric:        Mood and Affect: Mood normal.        Behavior: Behavior normal.     ED Results / Procedures / Treatments   Labs (all labs ordered are listed,  but only abnormal results are displayed) Labs Reviewed  COMPREHENSIVE METABOLIC PANEL - Abnormal; Notable for the following components:      Result Value   Glucose, Bld 119 (*)    Total Protein 5.8 (*)    AST 9 (*)    All other components within normal limits  RAPID URINE DRUG SCREEN, HOSP PERFORMED - Abnormal; Notable for the following components:   Opiates POSITIVE (*)    Benzodiazepines POSITIVE (*)    All other components within normal limits  LIPASE, BLOOD  CBC  URINALYSIS, ROUTINE W REFLEX MICROSCOPIC    EKG None  Radiology No results found.  Procedures Procedures    Medications Ordered in ED Medications  sodium chloride 0.9 % bolus 1,000 mL (0 mLs Intravenous Stopped 05/29/23 1904)  morphine (PF) 4 MG/ML injection 4 mg (4 mg Intravenous Given 05/29/23 1730)  ondansetron (ZOFRAN) injection 4 mg (4 mg Intravenous Given 05/29/23 1730)    ED Course/ Medical Decision Making/ A&P                                 Medical Decision Making Risk Prescription drug management.   This patient is a 52 y.o. female who presents to the ED for concern of dysphagia, poor dentition, chronic pain, dehydration, this involves an extensive number of treatment options, and is a complaint that carries with it a high risk of complications and morbidity. The emergent differential diagnosis prior to evaluation includes, but is not limited to,  dental abscess, esophageal stricture, dysphagia, electrolyte derangement . This  is not an exhaustive differential.   Past Medical History / Co-morbidities / Social History: history of chronic pain syndrome, bipolar, cardiac arrest   Additional history: Chart reviewed. Pertinent results include: seen for dental pain with neck swelling in June 2024, had  ct soft tissue neck which showed bilateral periapical abscesses, sent home with amoxicillin. Patient with cardiac arrest in 10/23  Physical Exam: Physical exam performed. The pertinent findings include: Multiple dental caries, no gross abscess, tolerating secretions.   Lab Tests: I ordered, and personally interpreted labs.  The pertinent results include:  total protein 5.8, unchanged from previous. No other acute laboratory findings  Medications: I ordered medication including fluids, morphine, zofran  for dehydration, nausea, pain. Reevaluation of the patient after these medicines showed that the patient improved. I have reviewed the patients home medicines and have made adjustments as needed.   Disposition: After consideration of the diagnostic results and the patients response to treatment, I feel that emergency department workup does not suggest an emergent condition requiring admission or immediate intervention beyond what has been performed at this time. The plan is: discharge with close outpatient follow-up and return precautions. Patients work-up is benign. She is able to tolerate liquids. She has no signs of an acute dental abscess or infection. She has pain that is unchanged from her chronic pain. Needs to have the rest of her teeth removed as I suspect that this is causing most of her issues. I see no reason why she would be too weak for surgery given her benign physical exam and labs. Will give dental resources for second opinion per patients request. Patient could also have a stricture causing her dysphagia and should likely be evaluated by GI for her inability to tolerate solid foods. Will give referral for same.  Recommend she consume protein shakes and smoothies in the interim to ensure adequate caloric intake.  Given that she can tolerate liquids, no indication for emergent GI intervention at this time. Recommend she see a pain specialist for management of her chronic pain as well. Recommend she facilitate this through her pcp who she follows closely with. Resources provided for this as well.  Evaluation and diagnostic testing in the emergency department does not suggest an emergent condition requiring admission or immediate intervention beyond what has been performed at this time.  Plan for discharge with close PCP follow-up.  Patient is understanding and amenable with plan, educated on red flag symptoms that would prompt immediate return.  Patient discharged in stable condition.  Findings and plan of care discussed with supervising physician Dr. Suezanne Jacquet who is in agreement.   Unfortunately, nursing staff informed me that patient left prior to receiving her paperwork which included resource guide for dentist, pain management, and GI referral.  Final Clinical Impression(s) / ED Diagnoses Final diagnoses:  Dysphagia, unspecified type  Pain, dental  Chronic pain syndrome    Rx / DC Orders ED Discharge Orders     None         Vear Clock 05/29/23 2112    Lonell Grandchild, MD 06/02/23 0710

## 2023-06-20 ENCOUNTER — Emergency Department (HOSPITAL_BASED_OUTPATIENT_CLINIC_OR_DEPARTMENT_OTHER): Payer: Medicaid Other

## 2023-06-20 ENCOUNTER — Encounter (HOSPITAL_BASED_OUTPATIENT_CLINIC_OR_DEPARTMENT_OTHER): Payer: Self-pay

## 2023-06-20 ENCOUNTER — Emergency Department (HOSPITAL_BASED_OUTPATIENT_CLINIC_OR_DEPARTMENT_OTHER)
Admission: EM | Admit: 2023-06-20 | Discharge: 2023-06-21 | Discharge status: Against Medical Advice | Payer: Medicaid Other | Attending: Emergency Medicine | Admitting: Emergency Medicine

## 2023-06-20 DIAGNOSIS — R531 Weakness: Secondary | ICD-10-CM | POA: Diagnosis present

## 2023-06-20 DIAGNOSIS — E861 Hypovolemia: Secondary | ICD-10-CM | POA: Insufficient documentation

## 2023-06-20 DIAGNOSIS — R791 Abnormal coagulation profile: Secondary | ICD-10-CM | POA: Diagnosis not present

## 2023-06-20 DIAGNOSIS — Z79899 Other long term (current) drug therapy: Secondary | ICD-10-CM | POA: Insufficient documentation

## 2023-06-20 DIAGNOSIS — Z1152 Encounter for screening for COVID-19: Secondary | ICD-10-CM | POA: Diagnosis not present

## 2023-06-20 DIAGNOSIS — R55 Syncope and collapse: Secondary | ICD-10-CM | POA: Diagnosis not present

## 2023-06-20 DIAGNOSIS — I5032 Chronic diastolic (congestive) heart failure: Secondary | ICD-10-CM | POA: Diagnosis not present

## 2023-06-20 LAB — RESP PANEL BY RT-PCR (RSV, FLU A&B, COVID)  RVPGX2
Influenza A by PCR: NEGATIVE
Influenza B by PCR: NEGATIVE
Resp Syncytial Virus by PCR: NEGATIVE
SARS Coronavirus 2 by RT PCR: NEGATIVE

## 2023-06-20 LAB — CBC
HCT: 40.5 % (ref 36.0–46.0)
Hemoglobin: 13.2 g/dL (ref 12.0–15.0)
MCH: 30.4 pg (ref 26.0–34.0)
MCHC: 32.6 g/dL (ref 30.0–36.0)
MCV: 93.3 fL (ref 80.0–100.0)
Platelets: 217 10*3/uL (ref 150–400)
RBC: 4.34 MIL/uL (ref 3.87–5.11)
RDW: 12.9 % (ref 11.5–15.5)
WBC: 7.6 10*3/uL (ref 4.0–10.5)
nRBC: 0 % (ref 0.0–0.2)

## 2023-06-20 LAB — BASIC METABOLIC PANEL
Anion gap: 4 — ABNORMAL LOW (ref 5–15)
BUN: 14 mg/dL (ref 6–20)
CO2: 33 mmol/L — ABNORMAL HIGH (ref 22–32)
Calcium: 9.2 mg/dL (ref 8.9–10.3)
Chloride: 103 mmol/L (ref 98–111)
Creatinine, Ser: 0.88 mg/dL (ref 0.44–1.00)
GFR, Estimated: >60 mL/min (ref >60)
Glucose, Bld: 121 mg/dL — ABNORMAL HIGH (ref 70–99)
Potassium: 3.9 mmol/L (ref 3.5–5.1)
Sodium: 140 mmol/L (ref 135–145)

## 2023-06-20 LAB — URINALYSIS, ROUTINE W REFLEX MICROSCOPIC
Bilirubin Urine: NEGATIVE
Glucose, UA: NEGATIVE mg/dL
Hgb urine dipstick: NEGATIVE
Ketones, ur: NEGATIVE mg/dL
Leukocytes,Ua: NEGATIVE
Nitrite: NEGATIVE
Protein, ur: NEGATIVE mg/dL
Specific Gravity, Urine: 1.025 (ref 1.005–1.030)
pH: 6.5 (ref 5.0–8.0)

## 2023-06-20 LAB — BRAIN NATRIURETIC PEPTIDE: B Natriuretic Peptide: 13.3 pg/mL (ref 0.0–100.0)

## 2023-06-20 LAB — PREGNANCY, URINE: Preg Test, Ur: NEGATIVE

## 2023-06-20 LAB — D-DIMER, QUANTITATIVE: D-Dimer, Quant: 0.61 ug{FEU}/mL — ABNORMAL HIGH (ref 0.00–0.50)

## 2023-06-20 LAB — TROPONIN I (HIGH SENSITIVITY): Troponin I (High Sensitivity): <2 ng/L (ref <18)

## 2023-06-20 MED ORDER — IOHEXOL 350 MG/ML SOLN
75.0000 mL | Freq: Once | INTRAVENOUS | Status: AC | PRN
  Administered 2023-06-20: 75 mL via INTRAVENOUS

## 2023-06-20 MED ORDER — TRAZODONE HCL 50 MG PO TABS: 50.0000 mg | ORAL_TABLET | Freq: Every evening | ORAL | Status: DC | PRN

## 2023-06-20 MED ORDER — OXYCODONE HCL ER 20 MG PO T12A: 20.0000 mg | EXTENDED_RELEASE_TABLET | Freq: Two times a day (BID) | ORAL | Status: DC

## 2023-06-20 MED ORDER — CLONAZEPAM 0.5 MG PO TABS: 0.5000 mg | ORAL_TABLET | Freq: Two times a day (BID) | ORAL | Status: DC | PRN

## 2023-06-20 MED ORDER — SODIUM CHLORIDE 0.9 % IV BOLUS
1000.0000 mL | Freq: Once | INTRAVENOUS | Status: AC
Start: 2023-06-20 — End: 2023-06-20
  Administered 2023-06-20: 1000 mL via INTRAVENOUS

## 2023-06-20 MED ORDER — ALBUTEROL SULFATE HFA 108 (90 BASE) MCG/ACT IN AERS: 1.0000 | INHALATION_SPRAY | Freq: Four times a day (QID) | RESPIRATORY_TRACT | Status: DC | PRN

## 2023-06-20 MED ORDER — GABAPENTIN 300 MG PO CAPS: 300.0000 mg | ORAL_CAPSULE | Freq: Two times a day (BID) | ORAL | Status: DC

## 2023-06-20 MED ORDER — GABAPENTIN 300 MG PO CAPS
300.0000 mg | ORAL_CAPSULE | Freq: Two times a day (BID) | ORAL | Status: DC
Start: 2023-06-21 — End: 2023-06-21

## 2023-06-20 MED ORDER — VALPROIC ACID 250 MG PO CAPS
500.0000 mg | ORAL_CAPSULE | Freq: Two times a day (BID) | ORAL | Status: DC
  Administered 2023-06-20: 250 mg via ORAL
  Filled 2023-06-20: qty 2

## 2023-06-20 NOTE — ED Notes (Signed)
ED TO INPATIENT HANDOFF REPORT  ED Nurse Name and Phone #: Hayden Pedro [phone]  S Name/Age/Gender Monica Hopkins 52 y.o. female Room/Bed: MH06/MH06  Code Status   Code Status: Prior  Home/SNF/Other Home Patient oriented to: self, place, time, and situation Is this baseline? No   Triage Complete: Triage complete  Chief Complaint Weakness and low blood pressure  Triage Note C/o generalized weakness, decreased PO intake due to dental pain. C/o nausea, sore throat, hypotension. Had syncopal episode this morning off of bed, was found by husband.   Allergies Allergies  Allergen Reactions   Abilify [Aripiprazole] Swelling and Rash   Lamictal [Lamotrigine] Swelling and Rash   Latuda [Lurasidone] Other (See Comments)    Caused leg weakness   Seroquel [Quetiapine] Other (See Comments)    Insomnia, restlessness    Level of Care/Admitting Diagnosis ED Disposition     ED Disposition  Admit   Condition  --   Comment  The patient appears reasonably stabilized for admission considering the current resources, flow, and capabilities available in the ED at this time, and I doubt any other [facility] requiring further screening and/or treatment in the ED prior to admission is  present.          B Medical/Surgery History Past Medical History:  Diagnosis Date   Prolapse, disk    Past Surgical History:  Procedure Laterality Date   NECK SURGERY       A IV Location/Drains/Wounds Patient Lines/Drains/Airways Status     Active Line/Drains/Airways     Name Placement date Placement time Site Days   Peripheral IV 06/20/23 20 G Left;Posterior Forearm 06/20/23  1658  Forearm  less than 1   Pressure Injury 05/20/22 Sacrum Unstageable - Full thickness tissue loss in which the base of the injury is covered by slough (yellow, tan, gray, green or brown) and/or eschar (tan, brown or black) in the wound bed. 05/20/22  0100  -- 396   Pressure Injury 05/20/22 Vertebral column Upper  Stage 3 -  Full thickness tissue loss. Subcutaneous fat may be visible but bone, tendon or muscle are NOT exposed. 05/20/22  0100  -- 396   Pressure Injury 05/20/22 Vertebral column Stage 2 -  Partial thickness loss of dermis presenting as a shallow open injury with a red, pink wound bed without slough. 05/20/22  0100  -- 396   Pressure Injury 05/20/22 Vertebral column Lower Stage 2 -  Partial thickness loss of dermis presenting as a shallow open injury with a red, pink wound bed without slough. 05/20/22  0100  -- 396            Intake/Output Last 24 hours  Intake/Output Summary (Last 24 hours) at 06/20/2023 2101 Last data filed at 06/20/2023 1936 Gross per 24 hour  Intake 1000.07 ml  Output --  Net 1000.07 ml    Labs/Imaging Results for orders placed or performed during the hospital encounter of 06/20/23 (from the past 48 hour(s))  Basic metabolic panel     Status: Abnormal   Collection Time: 06/20/23  4:55 PM  Result Value Ref Range   Sodium 140 135 - 145 mmol/L   Potassium 3.9 3.5 - 5.1 mmol/L   Chloride 103 98 - 111 mmol/L   CO2 33 (H) 22 - 32 mmol/L   Glucose, Bld 121 (H) 70 - 99 mg/dL    Comment: Glucose reference range applies only to samples taken after fasting for at least 8 hours.   BUN 14 6 - 20  mg/dL   Creatinine, Ser 0.98 0.44 - 1.00 mg/dL   Calcium 9.2 8.9 - 11.9 mg/dL   GFR, Estimated >14 >78 mL/min    Comment: (NOTE) Calculated using the CKD-EPI Creatinine Equation (2021)    Anion gap 4 (L) 5 - 15    Comment: Performed at [facility], [address] [place], [place] [address]  CBC     Status: None   Collection Time: 06/20/23  4:55 PM  Result Value Ref Range   WBC 7.6 4.0 - 10.5 K/uL   RBC 4.34 3.87 - 5.11 MIL/uL   Hemoglobin 13.2 12.0 - 15.0 g/dL   HCT 13.0 86.5 - 78.4 %   MCV 93.3 80.0 - 100.0 fL   MCH 30.4 26.0 - 34.0 pg   MCHC 32.6 30.0 - 36.0 g/dL   RDW 69.6 29.5 - 28.4 %   Platelets 217 150 - 400 K/uL   nRBC 0.0 0.0 - 0.2 %     Comment: Performed at [facility], [address] [place], [place] [address]  Urinalysis, Routine w reflex microscopic -Urine, Clean Catch     Status: None   Collection Time: 06/20/23  4:55 PM  Result Value Ref Range   Color, Urine YELLOW YELLOW   APPearance CLEAR CLEAR   Specific Gravity, Urine 1.025 1.005 - 1.030   pH 6.5 5.0 - 8.0   Glucose, UA NEGATIVE NEGATIVE mg/dL   Hgb urine dipstick NEGATIVE NEGATIVE   Bilirubin Urine NEGATIVE NEGATIVE   Ketones, ur NEGATIVE NEGATIVE mg/dL   Protein, ur NEGATIVE NEGATIVE mg/dL   Nitrite NEGATIVE NEGATIVE   Leukocytes,Ua NEGATIVE NEGATIVE    Comment: Microscopic not done on urines with negative protein, blood, leukocytes, nitrite, or glucose < 500 mg/dL. Performed at [facility], [address] [place], [place] [address]   Pregnancy, urine     Status: None   Collection Time: 06/20/23  4:55 PM  Result Value Ref Range   Preg Test, Ur NEGATIVE NEGATIVE    Comment:        THE SENSITIVITY OF THIS METHODOLOGY IS >25 mIU/mL. Performed at [facility], [address] [place], [place] [address]   Troponin I (High Sensitivity)     Status: None   Collection Time: 06/20/23  5:26 PM  Result Value Ref Range   Troponin I (High Sensitivity) <2 <18 ng/L    Comment: (NOTE) Elevated high sensitivity troponin I (hsTnI) values and significant  changes across serial measurements may suggest ACS but many other  chronic and acute conditions are known to elevate hsTnI results.  Refer to the "Links" section for chest pain algorithms and additional  guidance. Performed at [facility], [address] [place], [place] [address]   Resp panel by RT-PCR (RSV, Flu A&B, Covid) Anterior Nasal Swab     Status: None   Collection Time: 06/20/23  5:27 PM   Specimen: Anterior Nasal Swab  Result Value Ref Range   SARS Coronavirus 2 by RT PCR NEGATIVE NEGATIVE    Comment: (NOTE) SARS-CoV-2 target nucleic acids are NOT  DETECTED.  The SARS-CoV-2 RNA is generally detectable in upper respiratory specimens during the acute phase of infection. The lowest concentration of SARS-CoV-2 viral copies this assay can detect is 138 copies/mL. A negative result does not preclude SARS-Cov-2 infection and should not be used as the sole basis for treatment or other patient management decisions. A negative result may occur  with  improper specimen collection/handling, submission of specimen other than nasopharyngeal swab, presence of viral mutation(s) within the areas targeted by this assay, and inadequate number of viral copies(<138 copies/mL). A negative result must be combined with clinical observations, patient history, and epidemiological information. The expected result is Negative.  Fact Sheet for Patients:  BloggerCourse.com  Fact Sheet for Healthcare Providers:  SeriousBroker.it  This test is no t yet approved or cleared by the [place] FDA and  has been authorized for detection and/or diagnosis of SARS-CoV-2 by FDA under an Emergency Use Authorization (EUA). This EUA will remain  in effect (meaning this test can be used) for the duration of the COVID-19 declaration under Section 564(b)(1) of the Act, 21 U.S.C.section 360bbb-3(b)(1), unless the authorization is terminated  or revoked sooner.       Influenza A by PCR NEGATIVE NEGATIVE   Influenza B by PCR NEGATIVE NEGATIVE    Comment: (NOTE) The Xpert Xpress SARS-CoV-2/FLU/RSV plus assay is intended as an aid in the diagnosis of influenza from Nasopharyngeal swab specimens and should not be used as a sole basis for treatment. Nasal washings and aspirates are unacceptable for Xpert Xpress SARS-CoV-2/FLU/RSV testing.  Fact Sheet for Patients: BloggerCourse.com  Fact Sheet for Healthcare Providers: SeriousBroker.it  This test is not yet approved or  cleared by the [place] FDA and has been authorized for detection and/or diagnosis of SARS-CoV-2 by FDA under an Emergency Use Authorization (EUA). This EUA will remain in effect (meaning this test can be used) for the duration of the COVID-19 declaration under Section 564(b)(1) of the Act, 21 U.S.C. section 360bbb-3(b)(1), unless the authorization is terminated or revoked.     Resp Syncytial Virus by PCR NEGATIVE NEGATIVE    Comment: (NOTE) Fact Sheet for Patients: BloggerCourse.com  Fact Sheet for Healthcare Providers: SeriousBroker.it  This test is not yet approved or cleared by the [place] FDA and has been authorized for detection and/or diagnosis of SARS-CoV-2 by FDA under an Emergency Use Authorization (EUA). This EUA will remain in effect (meaning this test can be used) for the duration of the COVID-19 declaration under Section 564(b)(1) of the Act, 21 U.S.C. section 360bbb-3(b)(1), unless the authorization is terminated or revoked.  Performed at [facility], [address] [place], [place] [address]   Brain natriuretic peptide     Status: None   Collection Time: 06/20/23  5:37 PM  Result Value Ref Range   B Natriuretic Peptide 13.3 0.0 - 100.0 pg/mL    Comment: Performed at [facility], [address] [place], [place] [address]  D-dimer, quantitative     Status: Abnormal   Collection Time: 06/20/23  5:38 PM  Result Value Ref Range   D-Dimer, Quant 0.61 (H) 0.00 - 0.50 ug/mL-FEU    Comment: (NOTE) At the manufacturer cut-off value of 0.5 g/mL FEU, this assay has a negative predictive value of 95-100%.This assay is intended for use in conjunction with a clinical pretest probability (PTP) assessment model to exclude pulmonary embolism (PE) and deep venous thrombosis (DVT) in outpatients suspected of PE or DVT. Results should be correlated with clinical presentation. Performed at [facility], [address], [place], [place] [address]    CT Angio Chest PE W and/or Wo Contrast  Result Date: 06/20/2023 CLINICAL DATA:  Generalized weakness, nausea, hypotension. EXAM: CT ANGIOGRAPHY CHEST WITH CONTRAST TECHNIQUE: Multidetector CT imaging of the chest was performed using the standard protocol during bolus  administration of intravenous contrast. Multiplanar CT image reconstructions and MIPs were obtained to evaluate the vascular anatomy. RADIATION DOSE REDUCTION: This exam was performed according to the departmental dose-optimization program which includes automated exposure control, adjustment of the mA and/or kV according to patient size and/or use of iterative reconstruction technique. CONTRAST:  75mL OMNIPAQUE IOHEXOL 350 MG/ML SOLN COMPARISON:  May 19, 2022 FINDINGS: Cardiovascular: Satisfactory opacification of the pulmonary arteries to the segmental level. No evidence of pulmonary embolism. Normal heart size. No pericardial effusion. Mediastinum/Nodes: No enlarged mediastinal, hilar, or axillary lymph nodes. Thyroid gland, trachea, and esophagus demonstrate no significant findings. Lungs/Pleura: Lungs are clear. No pleural effusion or pneumothorax. Upper Abdomen: No acute abnormality. Musculoskeletal: No chest wall abnormality. No acute or significant osseous findings. Review of the MIP images confirms the above findings. IMPRESSION: No evidence of pulmonary embolism or other acute intrathoracic process. Electronically Signed   By: Ted Mcalpine M.D.   On: 06/20/2023 18:49    Pending Labs Unresulted Labs (From admission, onward)    None       Vitals/Pain Today's Vitals   06/20/23 1644 06/20/23 1644 06/20/23 1800 06/20/23 1936  BP:  115/76 100/73   Pulse:  83 70   Resp:  18 18   Temp:  98 F (36.7 C)    SpO2:  99% 98%   Weight: 61.2 kg     Height: 5\' 6"  (1.676 m)     PainSc: 8   0-No pain 6     Isolation Precautions No active  isolations  Medications Medications  sodium chloride 0.9 % bolus 1,000 mL (0 mLs Intravenous Stopped 06/20/23 1936)  iohexol (OMNIPAQUE) 350 MG/ML injection 75 mL (75 mLs Intravenous Contrast Given 06/20/23 1809)    Mobility walks with person assist     Focused Assessments Cardiac Assessment Handoff:    Lab Results  Component Value Date   CKTOTAL 699 (H) 05/24/2022   Lab Results  Component Value Date   DDIMER 0.61 (H) 06/20/2023   Does the Patient currently have chest pain? No  Generalize pain all over- Weakness C/o generalized weakness, decreased PO intake due to dental pain. C/o nausea, sore throat, hypotension. Had syncopal episode this morning off of bed, was found by husband.    R Recommendations: See Admitting Provider Note  Report given to:   Additional Notes: Patient state dental pain 10/10

## 2023-06-20 NOTE — ED Notes (Signed)
ED Provider at bedside. 

## 2023-06-20 NOTE — ED Triage Notes (Signed)
C/o generalized weakness, decreased PO intake due to dental pain. C/o nausea, sore throat, hypotension. Had syncopal episode this morning off of bed, was found by husband.

## 2023-06-20 NOTE — ED Notes (Signed)
Just returning from bathroom assisted by family and NT

## 2023-06-20 NOTE — ED Provider Notes (Signed)
[facility] AT [facility] Provider Note   CSN: [id number] Arrival date & time: 06/20/23  1633     History  Chief Complaint  Patient presents with   Weakness    Monica Hopkins is a 52 y.o. female.   Weakness     52 year old female with medical history significant for bipolar disorder, cardiac arrest, chronic diastolic CHF presenting to the emergency department with a syncopal episode.  The patient has had some dental pain and has had decreased oral intake as a result.  The patient states that she took all of her home medications this morning like she normally does.  She has a history of PEA arrest 1 year ago.  She states that she ambulated across her house, had no prodrome and suddenly woke up on the floor.  She has had a mild sore throat.  Patient has poor dentition at baseline.  No fevers.  Blood pressure was notably 80/40 at home.  She was found on the floor by family.  She denies chest pain, shortness of breath.  She feels dehydrated.  She denies headache.  She denies any focal neurologic deficits.  She does state that she has had some urinary frequency.  Home Medications Prior to Admission medications   Medication Sig Start Date End Date Taking? Authorizing Provider  albuterol (PROVENTIL HFA) 108 (90 Base) MCG/ACT inhaler Inhale 1-2 puffs into the lungs every 6 (six) hours as needed for wheezing or shortness of breath.    [provider]  clonazePAM (KLONOPIN) 0.5 MG tablet Take one tab twice a daily and 3rd needed for severe anxiety Patient taking differently: Take 0.5 mg by mouth 2 (two) times daily as needed for anxiety. May take 1 additional tablet if needed for anxiety. 03/10/22   Arfeen, Phillips Grout, MD  gabapentin (NEURONTIN) 300 MG capsule Take 1 capsule (300 mg total) by mouth 2 (two) times daily. 06/25/22   Lonia Blood, MD  lidocaine (XYLOCAINE) 2 % solution Use as directed 15 mLs in the mouth or throat every 4 (four) hours as  needed for mouth pain (swish and spit). 01/17/23   Kommor, Madison, MD  oxyCODONE (OXYCONTIN) 20 mg 12 hr tablet Take 1 tablet (20 mg total) by mouth every 12 (twelve) hours. 06/25/22   Lonia Blood, MD  polyethylene glycol (MIRALAX / GLYCOLAX) 17 g packet Take 17 g by mouth daily as needed for moderate constipation. 06/25/22   Lonia Blood, MD  senna-docusate (SENOKOT-S) 8.6-50 MG tablet Take 1 tablet by mouth at bedtime. 06/25/22   Lonia Blood, MD  traZODone (DESYREL) 50 MG tablet Take 1 tablet (50 mg total) by mouth at bedtime as needed for sleep. 06/25/22   Lonia Blood, MD  valproic acid (DEPAKENE) 250 MG capsule Take 2 capsules (500 mg total) by mouth 2 (two) times daily. 06/25/22   Lonia Blood, MD      Allergies    Abilify [aripiprazole], Lamictal [lamotrigine], Latuda [lurasidone], and Seroquel [quetiapine]    Review of Systems   Review of Systems  HENT:  Positive for dental problem.   Neurological:  Positive for syncope and weakness.  All other systems reviewed and are negative.   Physical Exam Updated Vital Signs BP 100/73   Pulse 70   Temp 98 F (36.7 C)   Resp 18   Ht 5\' 6"  (1.676 m)   Wt 61.2 kg   SpO2 98%   BMI 21.79 kg/m  Physical Exam Vitals  and nursing note reviewed.  Constitutional:      General: She is not in acute distress.    Appearance: She is well-developed.  HENT:     Head: Normocephalic and atraumatic.     Comments: Poor dentition with no evidence of apical abscess Eyes:     Conjunctiva/sclera: Conjunctivae normal.  Cardiovascular:     Rate and Rhythm: Normal rate and regular rhythm.     Heart sounds: No murmur heard. Pulmonary:     Effort: Pulmonary effort is normal. No respiratory distress.     Breath sounds: Normal breath sounds.  Abdominal:     Palpations: Abdomen is soft.     Tenderness: There is no abdominal tenderness.  Musculoskeletal:        General: No swelling.     Cervical back: Neck supple.   Skin:    General: Skin is warm and dry.     Capillary Refill: Capillary refill takes less than 2 seconds.  Neurological:     General: No focal deficit present.     Mental Status: She is alert and oriented to person, place, and time.     Cranial Nerves: No cranial nerve deficit.     Sensory: No sensory deficit.     Motor: No weakness.  Psychiatric:        Mood and Affect: Mood normal.     ED Results / Procedures / Treatments   Labs (all labs ordered are listed, but only abnormal results are displayed) Labs Reviewed  BASIC METABOLIC PANEL - Abnormal; Notable for the following components:      Result Value   CO2 33 (*)    Glucose, Bld 121 (*)    Anion gap 4 (*)    All other components within normal limits  D-DIMER, QUANTITATIVE - Abnormal; Notable for the following components:   D-Dimer, Quant 0.61 (*)    All other components within normal limits  RESP PANEL BY RT-PCR (RSV, FLU A&B, COVID)  RVPGX2  CBC  URINALYSIS, ROUTINE W REFLEX MICROSCOPIC  BRAIN NATRIURETIC PEPTIDE  PREGNANCY, URINE  CBG MONITORING, ED  TROPONIN I (HIGH SENSITIVITY)    EKG EKG Interpretation Date/Time:  Sunday June 20 2023 16:49:02 EST Ventricular Rate:  81 PR Interval:  155 QRS Duration:  105 QT Interval:  373 QTC Calculation: 433 R Axis:   -2  Text Interpretation: Sinus rhythm S1,S2,S3 pattern Low voltage, precordial leads No significant change since last tracing Confirmed by Ernie Avena ([id number]) on 06/20/2023 5:53:01 PM  Radiology CT Angio Chest PE W and/or Wo Contrast  Result Date: 06/20/2023 CLINICAL DATA:  Generalized weakness, nausea, hypotension. EXAM: CT ANGIOGRAPHY CHEST WITH CONTRAST TECHNIQUE: Multidetector CT imaging of the chest was performed using the standard protocol during bolus administration of intravenous contrast. Multiplanar CT image reconstructions and MIPs were obtained to evaluate the vascular anatomy. RADIATION DOSE REDUCTION: This exam was performed according to  the departmental dose-optimization program which includes automated exposure control, adjustment of the mA and/or kV according to patient size and/or use of iterative reconstruction technique. CONTRAST:  75mL OMNIPAQUE IOHEXOL 350 MG/ML SOLN COMPARISON:  May 19, 2022 FINDINGS: Cardiovascular: Satisfactory opacification of the pulmonary arteries to the segmental level. No evidence of pulmonary embolism. Normal heart size. No pericardial effusion. Mediastinum/Nodes: No enlarged mediastinal, hilar, or axillary lymph nodes. Thyroid gland, trachea, and esophagus demonstrate no significant findings. Lungs/Pleura: Lungs are clear. No pleural effusion or pneumothorax. Upper Abdomen: No acute abnormality. Musculoskeletal: No chest wall abnormality. No acute or significant osseous  findings. Review of the MIP images confirms the above findings. IMPRESSION: No evidence of pulmonary embolism or other acute intrathoracic process. Electronically Signed   By: Ted Mcalpine M.D.   On: 06/20/2023 18:49    Procedures Procedures    Medications Ordered in ED Medications  albuterol (VENTOLIN HFA) 108 (90 Base) MCG/ACT inhaler 1-2 puff (has no administration in time range)  clonazePAM (KLONOPIN) tablet 0.5 mg (has no administration in time range)  traZODone (DESYREL) tablet 50 mg (has no administration in time range)  valproic acid (DEPAKENE) 250 MG capsule 500 mg (has no administration in time range)  gabapentin (NEURONTIN) capsule 300 mg (has no administration in time range)  oxyCODONE (OXYCONTIN) 12 hr tablet 20 mg (has no administration in time range)  sodium chloride 0.9 % bolus 1,000 mL (0 mLs Intravenous Stopped 06/20/23 1936)  iohexol (OMNIPAQUE) 350 MG/ML injection 75 mL (75 mLs Intravenous Contrast Given 06/20/23 1809)    ED Course/ Medical Decision Making/ A&P                                 Medical Decision Making Amount and/or Complexity of Data Reviewed Labs: ordered. Radiology:  ordered.  Risk Prescription drug management. Decision regarding hospitalization.    52 year old female with medical history significant for bipolar disorder, cardiac arrest, chronic diastolic CHF presenting to the emergency department with a syncopal episode.  The patient has had some dental pain and has had decreased oral intake as a result.  The patient states that she took all of her home medications this morning like she normally does.  She has a history of PEA arrest 1 year ago.  She states that she ambulated across her house, had no prodrome and suddenly woke up on the floor.  She has had a mild sore throat.  Patient has poor dentition at baseline.  No fevers.  Blood pressure was notably 80/40 at home.  She was found on the floor by family.  She denies chest pain, shortness of breath.  She feels dehydrated.  She denies headache.  She denies any focal neurologic deficits.  She does state that she has had some urinary frequency.  Medical Decision Making:   Armilda Frampton is a 52 y.o. female who presented to the ED today with a syncopal episode detailed above.    Patient placed on continuous vitals and telemetry monitoring while in ED which was reviewed periodically.  Complete initial physical exam performed, notably the patient  was neuro intact, CTAB, mucous membranes appeared dry.    Reviewed and confirmed nursing documentation for past medical history, family history, social history.    Initial Assessment:   With the patient's presentation of syncope, she presents with a higher risk concerning syncope presentation given her history of cardiac arrest and diastolic CHF. Faint score positive given CHF history, positive by Francisco syncope rule, cannot rule out low risk etiology of syncope.  Initial Plan:  Screening labs including CBC and Metabolic panel to evaluate for infectious or metabolic etiology of disease.  Urinalysis with reflex culture ordered to evaluate for UTI or relevant  urologic/nephrologic pathology.  CXR to evaluate for structural/infectious intrathoracic pathology.  EKG to evaluate for cardiac pathology. Utilization of FAINT scoring detailed above.  D-dimer, BNP, troponin COVID and influenza testing Objective evaluation as below reviewed after administration of IVF/Telemetry monitoring  Initial Study Results:   Laboratory  All laboratory results reviewed without evidence of clinically relevant  pathology.   Exceptions include: D-dimer elevated to 0.61  EKG EKG was reviewed independently. Rate, rhythm, axis, intervals all examined and without medically relevant abnormality. ST segments without concerns for elevations.    Radiology:  All images reviewed independently. Agree with radiology report at this time.   CT Angio Chest PE W and/or Wo Contrast  Result Date: 06/20/2023 CLINICAL DATA:  Generalized weakness, nausea, hypotension. EXAM: CT ANGIOGRAPHY CHEST WITH CONTRAST TECHNIQUE: Multidetector CT imaging of the chest was performed using the standard protocol during bolus administration of intravenous contrast. Multiplanar CT image reconstructions and MIPs were obtained to evaluate the vascular anatomy. RADIATION DOSE REDUCTION: This exam was performed according to the departmental dose-optimization program which includes automated exposure control, adjustment of the mA and/or kV according to patient size and/or use of iterative reconstruction technique. CONTRAST:  75mL OMNIPAQUE IOHEXOL 350 MG/ML SOLN COMPARISON:  May 19, 2022 FINDINGS: Cardiovascular: Satisfactory opacification of the pulmonary arteries to the segmental level. No evidence of pulmonary embolism. Normal heart size. No pericardial effusion. Mediastinum/Nodes: No enlarged mediastinal, hilar, or axillary lymph nodes. Thyroid gland, trachea, and esophagus demonstrate no significant findings. Lungs/Pleura: Lungs are clear. No pleural effusion or pneumothorax. Upper Abdomen: No acute  abnormality. Musculoskeletal: No chest wall abnormality. No acute or significant osseous findings. Review of the MIP images confirms the above findings. IMPRESSION: No evidence of pulmonary embolism or other acute intrathoracic process. Electronically Signed   By: Ted Mcalpine M.D.   On: 06/20/2023 18:49      .   Final Assessment and Plan:   Patient overall with reassuring laboratory workup, reassuring CT imaging.  Neurologically intact with no headache. Head CT not indicated based on [place] Head CT criteria.  Symptoms could have been due to polypharmacy in the setting of the patient's multiple sedating medications at home versus cardiogenic arrhythmia versus hypovolemia from poor oral intake.  Orthostatic vitals were normal. Presenting with a higher risk syncopal episode however given her heart failure history and history of PEA arrest, recommended admission for observation on telemetry and cardiac ultrasound. Dr. Margo Aye was consulted and accepted the patient in admission for observation.   Clinical Impression:  1. Syncope, unspecified syncope type   2. Hypovolemia      Admit     Final Clinical Impression(s) / ED Diagnoses Final diagnoses:  Syncope, unspecified syncope type  Hypovolemia    Rx / DC Orders ED Discharge Orders     None         Ernie Avena, MD 06/20/23 2201

## 2023-06-20 NOTE — ED Notes (Signed)
Patient took all home medication around 2145 -  Valporic acid 250 mg tab, Neurontin 200mg  cap., Oxycodone ,20mg  tab,  Klonopin  .5mg  tab., traZodone 50 mg tab.

## 2023-06-21 NOTE — ED Provider Notes (Signed)
  Provider Note MRN:  [id number]  Arrival date & time: 06/21/23    ED Course and Medical Decision Making  Assumed care from Dr. Karene Fry at shift change.  Patient left against medical advice, fully aware of risks.  Procedures  Final Clinical Impressions(s) / ED Diagnoses     ICD-10-CM   1. Syncope, unspecified syncope type  R55     2. Hypovolemia  E86.1       ED Discharge Orders     None       Discharge Instructions   None     Elmer Sow. Pilar Plate, MD [facility] Emergency Medicine [facility] [email or web address]    Sabas Sous, MD 06/21/23 [phone]

## 2023-06-21 NOTE — ED Notes (Addendum)
Pt sleeping. Multiple instances of oxygen saturation dropping into 70s, but when RN goes to bedside and wakes patient oxygen returns to 98-100%. Pt wakes to voice, attempts to speak but her words are unintelligible, and falls back to sleep again. Even unlabored respirations noted.

## 2023-06-21 NOTE — ED Notes (Addendum)
RN assist patient to bathroom

## 2023-06-21 NOTE — ED Notes (Signed)
Assist ambulation by RN to bathroom

## 2023-06-21 NOTE — ED Notes (Addendum)
Patient is leaving AMA- Patient refused to sigh AMA e signature. She states , "It make me look bad. I have been here for 12 hours".

## 2023-07-03 ENCOUNTER — Encounter (HOSPITAL_COMMUNITY): Payer: Self-pay | Admitting: Emergency Medicine

## 2023-07-03 ENCOUNTER — Emergency Department (HOSPITAL_COMMUNITY): Payer: Medicaid Other

## 2023-07-03 ENCOUNTER — Emergency Department (HOSPITAL_COMMUNITY)
Admission: EM | Admit: 2023-07-03 | Discharge: 2023-07-03 | Disposition: A | Discharge status: Home / Self Care | Payer: Medicaid Other

## 2023-07-03 DIAGNOSIS — R519 Headache, unspecified: Secondary | ICD-10-CM | POA: Diagnosis present

## 2023-07-03 DIAGNOSIS — K047 Periapical abscess without sinus: Secondary | ICD-10-CM | POA: Insufficient documentation

## 2023-07-03 LAB — BASIC METABOLIC PANEL
Anion gap: 7 (ref 5–15)
BUN: 13 mg/dL (ref 6–20)
CO2: 30 mmol/L (ref 22–32)
Calcium: 9.3 mg/dL (ref 8.9–10.3)
Chloride: 102 mmol/L (ref 98–111)
Creatinine, Ser: 0.72 mg/dL (ref 0.44–1.00)
GFR, Estimated: >60 mL/min (ref >60)
Glucose, Bld: 139 mg/dL — ABNORMAL HIGH (ref 70–99)
Potassium: 4 mmol/L (ref 3.5–5.1)
Sodium: 139 mmol/L (ref 135–145)

## 2023-07-03 LAB — CBC
HCT: 40.1 % (ref 36.0–46.0)
Hemoglobin: 12.9 g/dL (ref 12.0–15.0)
MCH: 30.3 pg (ref 26.0–34.0)
MCHC: 32.2 g/dL (ref 30.0–36.0)
MCV: 94.1 fL (ref 80.0–100.0)
Platelets: 211 10*3/uL (ref 150–400)
RBC: 4.26 MIL/uL (ref 3.87–5.11)
RDW: 13.2 % (ref 11.5–15.5)
WBC: 7.6 10*3/uL (ref 4.0–10.5)
nRBC: 0 % (ref 0.0–0.2)

## 2023-07-03 LAB — TROPONIN I (HIGH SENSITIVITY): Troponin I (High Sensitivity): 2 ng/L (ref <18)

## 2023-07-03 MED ORDER — ONDANSETRON HCL 4 MG/2ML IJ SOLN
4.0000 mg | Freq: Once | INTRAMUSCULAR | Status: AC
Start: 2023-07-03 — End: 2023-07-03
  Administered 2023-07-03: 4 mg via INTRAVENOUS
  Filled 2023-07-03: qty 2

## 2023-07-03 MED ORDER — MORPHINE SULFATE (PF) 4 MG/ML IV SOLN
4.0000 mg | Freq: Once | INTRAVENOUS | Status: AC
  Administered 2023-07-03: 4 mg via INTRAVENOUS
  Filled 2023-07-03: qty 1

## 2023-07-03 MED ORDER — IOHEXOL 350 MG/ML SOLN
75.0000 mL | Freq: Once | INTRAVENOUS | Status: AC | PRN
  Administered 2023-07-03: 75 mL via INTRAVENOUS

## 2023-07-03 MED ORDER — AMOXICILLIN-POT CLAVULANATE 875-125 MG PO TABS: 1.0000 | ORAL_TABLET | Freq: Two times a day (BID) | ORAL | 0 refills | Status: DC

## 2023-07-03 NOTE — ED Provider Notes (Signed)
[place] EMERGENCY DEPARTMENT AT [facility] Provider Note   CSN: [id number] Arrival date & time: 07/03/23  1544     History  No chief complaint on file.   Monica Hopkins is a 52 y.o. female.  52 year old female with past medical history of anxiety and chronic pain presenting to the emergency department today with pain in her right face and neck.  The patient states that this been going on now for the past month or so.  She was evaluated at [facility] emergency department and left AGAINST MEDICAL ADVICE after the plan was to admit her for a syncopal episode.  The patient has not had any further syncopal episodes.  She states that the pain in her face and neck has gotten worse.  She states that she is having some painful/difficulty swallowing.  Denies any difficulty breathing.  She saw her primary care doctor who recommended that she go see her dentist and also referred her to pain management.  The patient states that the pain comes and goes.  She states that she has not really been eating or drinking much due to this.  She denies any fevers.  Denies any focal weakness, numbness, or tingling.        Home Medications Prior to Admission medications   Medication Sig Start Date End Date Taking? Authorizing Provider  amoxicillin-clavulanate (AUGMENTIN) 875-125 MG tablet Take 1 tablet by mouth every 12 (twelve) hours. 07/03/23  Yes Durwin Glaze, MD  albuterol (PROVENTIL HFA) 108 (90 Base) MCG/ACT inhaler Inhale 1-2 puffs into the lungs every 6 (six) hours as needed for wheezing or shortness of breath.    [provider]  clonazePAM (KLONOPIN) 0.5 MG tablet Take one tab twice a daily and 3rd needed for severe anxiety Patient taking differently: Take 0.5 mg by mouth 2 (two) times daily as needed for anxiety. May take 1 additional tablet if needed for anxiety. 03/10/22   Arfeen, Phillips Grout, MD  gabapentin (NEURONTIN) 300 MG capsule Take 1 capsule (300 mg total) by mouth 2 (two)  times daily. 06/25/22   Lonia Blood, MD  lidocaine (XYLOCAINE) 2 % solution Use as directed 15 mLs in the mouth or throat every 4 (four) hours as needed for mouth pain (swish and spit). 01/17/23   Kommor, Madison, MD  oxyCODONE (OXYCONTIN) 20 mg 12 hr tablet Take 1 tablet (20 mg total) by mouth every 12 (twelve) hours. 06/25/22   Lonia Blood, MD  polyethylene glycol (MIRALAX / GLYCOLAX) 17 g packet Take 17 g by mouth daily as needed for moderate constipation. 06/25/22   Lonia Blood, MD  senna-docusate (SENOKOT-S) 8.6-50 MG tablet Take 1 tablet by mouth at bedtime. 06/25/22   Lonia Blood, MD  traZODone (DESYREL) 50 MG tablet Take 1 tablet (50 mg total) by mouth at bedtime as needed for sleep. 06/25/22   Lonia Blood, MD  valproic acid (DEPAKENE) 250 MG capsule Take 2 capsules (500 mg total) by mouth 2 (two) times daily. 06/25/22   Lonia Blood, MD      Allergies    Abilify [aripiprazole], Lamictal [lamotrigine], Latuda [lurasidone], and Seroquel [quetiapine]    Review of Systems   Review of Systems  HENT:  Positive for facial swelling.   Musculoskeletal:  Positive for neck pain.  All other systems reviewed and are negative.   Physical Exam Updated Vital Signs BP 95/67   Pulse 65   Temp 98.1 F (36.7 C) (Oral)   Resp  17   SpO2 97%  Physical Exam Vitals and nursing note reviewed.   Gen: NAD Eyes: PERRL, EOMI HEENT: No significant oropharyngeal swelling is noted, the patient does have poor dentition on the left with some mild surrounding erythema, there is some mild periorbital edema noted Neck: trachea midline, the patient does have some tenderness over the submandibular region and there did appear to be some possible swollen lymph nodes.  No crepitus. Resp: clear to auscultation bilaterally Card: RRR, no murmurs, rubs, or gallops Abd: nontender, nondistended Extremities: no calf tenderness, no edema Vascular: 2+ radial pulses bilaterally, 2+  DP pulses bilaterally Neuro: Cranial nerves intact with exception of subtle ptosis on the left versus mild periorbital edema, equal strength sensation throughout the bilateral upper and lower extremities Skin: no rashes Psyc: acting appropriately   ED Results / Procedures / Treatments   Labs (all labs ordered are listed, but only abnormal results are displayed) Labs Reviewed  BASIC METABOLIC PANEL - Abnormal; Notable for the following components:      Result Value   Glucose, Bld 139 (*)    All other components within normal limits  CBC  TROPONIN I (HIGH SENSITIVITY)  TROPONIN I (HIGH SENSITIVITY)    EKG None  Radiology CT Soft Tissue Neck W Contrast  Result Date: 07/03/2023 CLINICAL DATA:  Initial evaluation for soft tissue infection. EXAM: CT NECK WITH CONTRAST TECHNIQUE: Multidetector CT imaging of the neck was performed using the standard protocol following the bolus administration of intravenous contrast. RADIATION DOSE REDUCTION: This exam was performed according to the departmental dose-optimization program which includes automated exposure control, adjustment of the mA and/or kV according to patient size and/or use of iterative reconstruction technique. CONTRAST:  75mL OMNIPAQUE IOHEXOL 350 MG/ML SOLN COMPARISON:  Prior CT from 01/17/2023. FINDINGS: Pharynx and larynx: Oral cavity within normal limits. Poor dentition noted. No acute inflammatory changes seen about the remaining teeth. Oropharynx within normal limits. Slight asymmetric prominence of the left adenoidal soft tissues without discrete mass, stable from prior. No retropharyngeal collection or swelling. Negative epiglottis. Hypopharynx and supraglottic larynx within normal limits. Glottis normal. Subglottic airway patent clear. Salivary glands: Salivary glands including the parotid and submandibular glands are within normal limits. Thyroid: Normal. Lymph nodes: No enlarged or pathologic adenopathy within the neck. Vascular:  Normal intravascular enhancement seen throughout the neck. Limited intracranial: Unremarkable. Visualized orbits: Unremarkable. Mastoids and visualized paranasal sinuses: Paranasal sinuses are largely clear. Trace right mastoid effusion noted, of doubtful significance. Middle ear cavities are clear. Skeleton: No discrete or worrisome osseous lesions. Prior fusion at C5-6 with solid arthrodesis. Upper chest: No acute finding. Other: None. IMPRESSION: 1. No acute abnormality within the neck. No evidence for soft tissue infection. 2. Poor dentition.  No associated inflammatory changes by CT. Electronically Signed   By: Rise Mu M.D.   On: 07/03/2023 19:01   CT Head Wo Contrast  Result Date: 07/03/2023 CLINICAL DATA:  Acute stroke suspected. EXAM: CT HEAD WITHOUT CONTRAST TECHNIQUE: Contiguous axial images were obtained from the base of the skull through the vertex without intravenous contrast. RADIATION DOSE REDUCTION: This exam was performed according to the departmental dose-optimization program which includes automated exposure control, adjustment of the mA and/or kV according to patient size and/or use of iterative reconstruction technique. COMPARISON:  CT head 05/19/2022.  MRI brain 05/24/2022. FINDINGS: Brain: No evidence of acute infarction, hemorrhage, hydrocephalus, extra-axial collection or mass lesion/mass effect. Vascular: No hyperdense vessel or unexpected calcification. Skull: Normal. Negative for fracture or focal  lesion. Sinuses/Orbits: No acute finding. Other: None. IMPRESSION: No acute intracranial abnormality. Electronically Signed   By: Darliss Cheney M.D.   On: 07/03/2023 18:49    Procedures Procedures    Medications Ordered in ED Medications  morphine (PF) 4 MG/ML injection 4 mg (4 mg Intravenous Given 07/03/23 1705)  ondansetron (ZOFRAN) injection 4 mg (4 mg Intravenous Given 07/03/23 1705)  iohexol (OMNIPAQUE) 350 MG/ML injection 75 mL (75 mLs Intravenous Contrast Given  07/03/23 1833)    ED Course/ Medical Decision Making/ A&P                                 Medical Decision Making 52 year old female presenting to the emergency department today with left-sided facial and neck pain.  Patient does have some mild swelling noted to the left side of her face and some mild periorbital edema.  The patient is otherwise neurovascularly intact but seem to have some mild ptosis over this may be due to the swelling of her eyelid on the left.  I will further evaluate her here with a CT scan of her head in addition to CT scans of her maxillofacial region and neck with contrast to evaluate for infection.  This may be due to dental infection.  I will give the patient morphine and Zofran for symptoms.  The patient has not had any further syncopal episodes and did have a CT angiogram which I have reviewed that did not show any acute abnormalities after her D-dimer was elevated during her most recent evaluation in the emergency department.  I will reevaluate for ultimate disposition.  The patient's labs are reassuring.  CT scans not show any acute findings.  On reassessment I do not appreciate any ptosis.  Her cranial nerves remain intact.  The patient does have a prescription for Tegretol and was told by her doctor to try this for possible trigeminal neuralgia.  I think that this very well may be causing her symptoms.  I will give the patient antibiotics for possible odontogenic infection as she does have poor dentition as well as some gingival erythema.  I think that she is stable for discharge.  Amount and/or Complexity of Data Reviewed Labs: ordered. Radiology: ordered.  Risk Prescription drug management.           Final Clinical Impression(s) / ED Diagnoses Final diagnoses:  Dental infection  Facial pain    Rx / DC Orders ED Discharge Orders          Ordered    amoxicillin-clavulanate (AUGMENTIN) 875-125 MG tablet  Every 12 hours        07/03/23 2005               Durwin Glaze, MD 07/03/23 2006

## 2023-07-03 NOTE — ED Triage Notes (Signed)
Pt here from home with c/o all over body pain including some left side of her face hurting from some bad teeth , pt recently left [facility] at [facility] ( did not follow up on a elevated d dimer )

## 2023-07-03 NOTE — Discharge Instructions (Addendum)
Your workup today was reassuring.  Please take the antibiotic as prescribed.  I do think it is reasonable to start the Tegretol.  Please follow-up with your doctor.  Return to the ER for worsening symptoms.

## 2023-07-11 ENCOUNTER — Emergency Department (HOSPITAL_COMMUNITY): Payer: Medicaid Other

## 2023-07-11 ENCOUNTER — Encounter (HOSPITAL_COMMUNITY): Payer: Self-pay

## 2023-07-11 ENCOUNTER — Emergency Department (HOSPITAL_COMMUNITY)
Admission: EM | Admit: 2023-07-11 | Discharge: 2023-07-11 | Disposition: A | Discharge status: Home / Self Care | Payer: Medicaid Other | Attending: Emergency Medicine | Admitting: Emergency Medicine

## 2023-07-11 ENCOUNTER — Other Ambulatory Visit: Payer: Self-pay

## 2023-07-11 DIAGNOSIS — S0990XA Unspecified injury of head, initial encounter: Secondary | ICD-10-CM | POA: Diagnosis present

## 2023-07-11 DIAGNOSIS — W010XXA Fall on same level from slipping, tripping and stumbling without subsequent striking against object, initial encounter: Secondary | ICD-10-CM | POA: Diagnosis not present

## 2023-07-11 DIAGNOSIS — S0083XA Contusion of other part of head, initial encounter: Secondary | ICD-10-CM | POA: Diagnosis not present

## 2023-07-11 MED ORDER — OXYCODONE HCL 5 MG PO TABS
5.0000 mg | ORAL_TABLET | Freq: Once | ORAL | Status: DC
  Filled 2023-07-11: qty 1

## 2023-07-11 MED ORDER — OXYCODONE HCL 5 MG PO TABS: 5.0000 mg | ORAL_TABLET | Freq: Four times a day (QID) | ORAL | 0 refills | Status: AC | PRN

## 2023-07-11 NOTE — Discharge Instructions (Addendum)
Recommend ice, 1000 mg of Tylenol every 6 hours as needed for pain.  Ibuprofen or Aleve if you can take that medicine as well.  I have prescribed you narcotic pain medicine for breakthrough pain.  This medication is sedating so do not mix with alcohol or drugs or dangerous activities including driving.

## 2023-07-11 NOTE — ED Triage Notes (Addendum)
Pt's husband states that pt tripped over dog yesterday and fell. Pt hit face on dirt. Pt c/o pain in face, neck and shoulders. Pt does not take blood thinners. Pt did not have LOC.

## 2023-07-11 NOTE — ED Provider Notes (Signed)
[facility] EMERGENCY DEPARTMENT AT [facility] Provider Note   CSN: [id number] Arrival date & time: 07/11/23  1318     History  Chief Complaint  Patient presents with   Monica Hopkins is a 52 y.o. female.  Patient here after fall yesterday.  She was walking back to the house with her dog when she tripped over the dog and landed on the left side of her face.  She is having swelling to the left side of her face and some left shoulder pain.  No loss consciousness.  Nothing makes it worse or better.  She not on any blood thinners.  No other extremity pain or issues elsewhere.  The history is provided by the patient.       Home Medications Prior to Admission medications   Medication Sig Start Date End Date Taking? Authorizing Provider  oxyCODONE (ROXICODONE) 5 MG immediate release tablet Take 1 tablet (5 mg total) by mouth every 6 (six) hours as needed for up to 10 days for breakthrough pain. 07/11/23 07/21/23 Yes Cidney Kirkwood, DO  albuterol (PROVENTIL HFA) 108 (90 Base) MCG/ACT inhaler Inhale 1-2 puffs into the lungs every 6 (six) hours as needed for wheezing or shortness of breath.    [provider]  amoxicillin-clavulanate (AUGMENTIN) 875-125 MG tablet Take 1 tablet by mouth every 12 (twelve) hours. 07/03/23   Durwin Glaze, MD  clonazePAM (KLONOPIN) 0.5 MG tablet Take one tab twice a daily and 3rd needed for severe anxiety Patient taking differently: Take 0.5 mg by mouth 2 (two) times daily as needed for anxiety. May take 1 additional tablet if needed for anxiety. 03/10/22   Arfeen, Phillips Grout, MD  gabapentin (NEURONTIN) 300 MG capsule Take 1 capsule (300 mg total) by mouth 2 (two) times daily. 06/25/22   Lonia Blood, MD  lidocaine (XYLOCAINE) 2 % solution Use as directed 15 mLs in the mouth or throat every 4 (four) hours as needed for mouth pain (swish and spit). 01/17/23   Kommor, Madison, MD  polyethylene glycol (MIRALAX / GLYCOLAX) 17 g packet Take 17  g by mouth daily as needed for moderate constipation. 06/25/22   Lonia Blood, MD  senna-docusate (SENOKOT-S) 8.6-50 MG tablet Take 1 tablet by mouth at bedtime. 06/25/22   Lonia Blood, MD  traZODone (DESYREL) 50 MG tablet Take 1 tablet (50 mg total) by mouth at bedtime as needed for sleep. 06/25/22   Lonia Blood, MD  valproic acid (DEPAKENE) 250 MG capsule Take 2 capsules (500 mg total) by mouth 2 (two) times daily. 06/25/22   Lonia Blood, MD      Allergies    Abilify [aripiprazole], Lamictal [lamotrigine], Latuda [lurasidone], and Seroquel [quetiapine]    Review of Systems   Review of Systems  Physical Exam Updated Vital Signs BP 112/78 (BP Location: Right Arm)   Pulse 66   Temp 98.7 F (37.1 C) (Oral)   Resp 14   Ht 5\' 7"  (1.702 m)   Wt 66.7 kg   SpO2 94%   BMI 23.02 kg/m  Physical Exam Vitals and nursing note reviewed.  Constitutional:      General: She is not in acute distress.    Appearance: She is well-developed.  HENT:     Head:     Comments: Left-sided facial swelling Eyes:     Extraocular Movements: Extraocular movements intact.     Conjunctiva/sclera: Conjunctivae normal.     Pupils: Pupils are equal,  round, and reactive to light.  Cardiovascular:     Rate and Rhythm: Normal rate and regular rhythm.     Heart sounds: No murmur heard. Pulmonary:     Effort: Pulmonary effort is normal. No respiratory distress.     Breath sounds: Normal breath sounds.  Abdominal:     Palpations: Abdomen is soft.     Tenderness: There is no abdominal tenderness.  Musculoskeletal:        General: Tenderness present. No swelling.     Cervical back: Neck supple.     Comments: Tenderness to the left shoulder, tenderness over the left side of the face  Skin:    General: Skin is warm and dry.     Capillary Refill: Capillary refill takes less than 2 seconds.  Neurological:     Mental Status: She is alert.  Psychiatric:        Mood and Affect: Mood  normal.     ED Results / Procedures / Treatments   Labs (all labs ordered are listed, but only abnormal results are displayed) Labs Reviewed - No data to display  EKG None  Radiology CT Head Wo Contrast  Result Date: 07/11/2023 CLINICAL DATA:  Headache, new onset (Age >= 51y); Facial trauma, blunt; Neck trauma, dangerous injury mechanism (Age 103-64y) EXAM: CT HEAD WITHOUT CONTRAST CT MAXILLOFACIAL WITHOUT CONTRAST CT CERVICAL SPINE WITHOUT CONTRAST TECHNIQUE: Multidetector CT imaging of the head, cervical spine, and maxillofacial structures were performed using the standard protocol without intravenous contrast. Multiplanar CT image reconstructions of the cervical spine and maxillofacial structures were also generated. RADIATION DOSE REDUCTION: This exam was performed according to the departmental dose-optimization program which includes automated exposure control, adjustment of the mA and/or kV according to patient size and/or use of iterative reconstruction technique. COMPARISON:  Head CT 07/03/23 FINDINGS: CT HEAD FINDINGS Brain: No hemorrhage. No hydrocephalus. No extra-axial fluid collection. No CT evidence of an acute cortical infarct. No mass effect. No mass lesion. Mineralization of the basal ganglia bilaterally. Vascular: No hyperdense vessel or unexpected calcification. Skull: Normal. Negative for fracture or focal lesion. Other: None. CT MAXILLOFACIAL FINDINGS Osseous: No fracture or mandibular dislocation. No destructive process. Orbits: Negative. No traumatic or inflammatory finding. Sinuses: Small right mastoid effusion.  No middle ear effusion. Soft tissues: Negative. CT CERVICAL SPINE FINDINGS Alignment: Trace retrolisthesis of C4 on C5. Skull base and vertebrae: Status post C5-C6 ACDF with solid osseous fusion. Soft tissues and spinal canal: No prevertebral fluid or swelling. No visible canal hematoma. Disc levels:  No CT evidence of high-grade spinal canal stenosis Upper chest:  Negative. Other: No IMPRESSION: 1. No CT evidence of intracranial injury. 2. No acute facial bone fracture. 3. No acute fracture or traumatic subluxation of the cervical spine. Electronically Signed   By: Lorenza Cambridge M.D.   On: 07/11/2023 15:00   CT Cervical Spine Wo Contrast  Result Date: 07/11/2023 CLINICAL DATA:  Headache, new onset (Age >= 51y); Facial trauma, blunt; Neck trauma, dangerous injury mechanism (Age 52-64y) EXAM: CT HEAD WITHOUT CONTRAST CT MAXILLOFACIAL WITHOUT CONTRAST CT CERVICAL SPINE WITHOUT CONTRAST TECHNIQUE: Multidetector CT imaging of the head, cervical spine, and maxillofacial structures were performed using the standard protocol without intravenous contrast. Multiplanar CT image reconstructions of the cervical spine and maxillofacial structures were also generated. RADIATION DOSE REDUCTION: This exam was performed according to the departmental dose-optimization program which includes automated exposure control, adjustment of the mA and/or kV according to patient size and/or use of iterative reconstruction technique. COMPARISON:  Head CT 07/03/23 FINDINGS: CT HEAD FINDINGS Brain: No hemorrhage. No hydrocephalus. No extra-axial fluid collection. No CT evidence of an acute cortical infarct. No mass effect. No mass lesion. Mineralization of the basal ganglia bilaterally. Vascular: No hyperdense vessel or unexpected calcification. Skull: Normal. Negative for fracture or focal lesion. Other: None. CT MAXILLOFACIAL FINDINGS Osseous: No fracture or mandibular dislocation. No destructive process. Orbits: Negative. No traumatic or inflammatory finding. Sinuses: Small right mastoid effusion.  No middle ear effusion. Soft tissues: Negative. CT CERVICAL SPINE FINDINGS Alignment: Trace retrolisthesis of C4 on C5. Skull base and vertebrae: Status post C5-C6 ACDF with solid osseous fusion. Soft tissues and spinal canal: No prevertebral fluid or swelling. No visible canal hematoma. Disc levels:  No  CT evidence of high-grade spinal canal stenosis Upper chest: Negative. Other: No IMPRESSION: 1. No CT evidence of intracranial injury. 2. No acute facial bone fracture. 3. No acute fracture or traumatic subluxation of the cervical spine. Electronically Signed   By: Lorenza Cambridge M.D.   On: 07/11/2023 15:00   CT Maxillofacial Wo Contrast  Result Date: 07/11/2023 CLINICAL DATA:  Headache, new onset (Age >= 51y); Facial trauma, blunt; Neck trauma, dangerous injury mechanism (Age 28-64y) EXAM: CT HEAD WITHOUT CONTRAST CT MAXILLOFACIAL WITHOUT CONTRAST CT CERVICAL SPINE WITHOUT CONTRAST TECHNIQUE: Multidetector CT imaging of the head, cervical spine, and maxillofacial structures were performed using the standard protocol without intravenous contrast. Multiplanar CT image reconstructions of the cervical spine and maxillofacial structures were also generated. RADIATION DOSE REDUCTION: This exam was performed according to the departmental dose-optimization program which includes automated exposure control, adjustment of the mA and/or kV according to patient size and/or use of iterative reconstruction technique. COMPARISON:  Head CT 07/03/23 FINDINGS: CT HEAD FINDINGS Brain: No hemorrhage. No hydrocephalus. No extra-axial fluid collection. No CT evidence of an acute cortical infarct. No mass effect. No mass lesion. Mineralization of the basal ganglia bilaterally. Vascular: No hyperdense vessel or unexpected calcification. Skull: Normal. Negative for fracture or focal lesion. Other: None. CT MAXILLOFACIAL FINDINGS Osseous: No fracture or mandibular dislocation. No destructive process. Orbits: Negative. No traumatic or inflammatory finding. Sinuses: Small right mastoid effusion.  No middle ear effusion. Soft tissues: Negative. CT CERVICAL SPINE FINDINGS Alignment: Trace retrolisthesis of C4 on C5. Skull base and vertebrae: Status post C5-C6 ACDF with solid osseous fusion. Soft tissues and spinal canal: No prevertebral  fluid or swelling. No visible canal hematoma. Disc levels:  No CT evidence of high-grade spinal canal stenosis Upper chest: Negative. Other: No IMPRESSION: 1. No CT evidence of intracranial injury. 2. No acute facial bone fracture. 3. No acute fracture or traumatic subluxation of the cervical spine. Electronically Signed   By: Lorenza Cambridge M.D.   On: 07/11/2023 15:00   DG Shoulder Left  Result Date: 07/11/2023 CLINICAL DATA:  Fall.  Pain. EXAM: LEFT SHOULDER - 2+ VIEW COMPARISON:  None Available. FINDINGS: No acute fracture or dislocation. Visualized portion of the left hemithorax is normal. IMPRESSION: No acute osseous abnormality. Electronically Signed   By: Jeronimo Greaves M.D.   On: 07/11/2023 14:57    Procedures Procedures    Medications Ordered in ED Medications  oxyCODONE (Oxy IR/ROXICODONE) immediate release tablet 5 mg (5 mg Oral Not Given 07/11/23 1608)    ED Course/ Medical Decision Making/ A&P                                 Medical Decision  Making Risk Prescription drug management.   Trinitie Jepsen is here with mechanical fall yesterday.  Not on blood thinners.  Swelling and tenderness over the left side of the face, left shoulder tenderness.  Head CT neck CT face CT and x-ray of the left shoulder were obtained.  Differential diagnosis likely contusion versus fracture.  Teeth are intact.  No obvious jaw dislocation.  CT scans were unremarkable including x-ray of the left shoulder per my review and interpretation radiology report.  Overall suspect contusion.  Given Roxicodone and will prescribe the same.  Recommend Tylenol, ibuprofen and ice as well and follow-up with primary care doctor.  Discharged in good condition.  This chart was dictated using voice recognition software.  Despite best efforts to proofread,  errors can occur which can change the documentation meaning.         Final Clinical Impression(s) / ED Diagnoses Final diagnoses:  Contusion of face, initial  encounter    Rx / DC Orders ED Discharge Orders          Ordered    oxyCODONE (ROXICODONE) 5 MG immediate release tablet  Every 6 hours PRN        07/11/23 1608              Virgina Norfolk, DO 07/11/23 1613

## 2023-07-11 NOTE — ED Provider Triage Note (Signed)
Emergency Medicine Provider Triage Evaluation Note  Ulyssa Crownover , a 52 y.o. female  was evaluated in triage.  Pt complains of left face, neck, shoulder, HA following fall last evening at. She was walking back to house in dark when a dog ran underneath her feet and she tripped over it falling down onto her face. No LOC. No thinners  Review of Systems  Positive: left face, neck, shoulder pain HA Negative: loc  Physical Exam  BP 129/78 (BP Location: Right Arm)   Pulse 77   Temp 98.2 F (36.8 C)   Resp 17   Ht 5\' 7"  (1.702 m)   Wt 66.7 kg   SpO2 94%   BMI 23.02 kg/m  Gen:   Awake, no distress   Resp:  Normal effort  MSK:   Moves extremities without difficulty  Other:    Medical Decision Making  Medically screening exam initiated at 2:02 PM.  Appropriate orders placed.  Emjay Montieth was informed that the remainder of the evaluation will be completed by another provider, this initial triage assessment does not replace that evaluation, and the importance of remaining in the ED until their evaluation is complete.  Imaging ordered   Judithann Sheen, PA 07/11/23 1409

## 2023-07-22 ENCOUNTER — Emergency Department (HOSPITAL_COMMUNITY)
Admission: EM | Admit: 2023-07-22 | Discharge: 2023-07-23 | Discharge status: Against Medical Advice | Payer: Medicaid Other | Attending: Emergency Medicine | Admitting: Emergency Medicine

## 2023-07-22 ENCOUNTER — Other Ambulatory Visit: Payer: Self-pay

## 2023-07-22 ENCOUNTER — Encounter (HOSPITAL_COMMUNITY): Payer: Self-pay

## 2023-07-22 DIAGNOSIS — E46 Unspecified protein-calorie malnutrition: Secondary | ICD-10-CM | POA: Diagnosis not present

## 2023-07-22 DIAGNOSIS — R627 Adult failure to thrive: Secondary | ICD-10-CM | POA: Insufficient documentation

## 2023-07-22 DIAGNOSIS — K0889 Other specified disorders of teeth and supporting structures: Secondary | ICD-10-CM | POA: Insufficient documentation

## 2023-07-22 DIAGNOSIS — Z8674 Personal history of sudden cardiac arrest: Secondary | ICD-10-CM | POA: Diagnosis not present

## 2023-07-22 DIAGNOSIS — E860 Dehydration: Secondary | ICD-10-CM | POA: Diagnosis not present

## 2023-07-22 DIAGNOSIS — Z5321 Procedure and treatment not carried out due to patient leaving prior to being seen by health care provider: Secondary | ICD-10-CM | POA: Insufficient documentation

## 2023-07-22 DIAGNOSIS — K029 Dental caries, unspecified: Secondary | ICD-10-CM | POA: Diagnosis not present

## 2023-07-22 DIAGNOSIS — R0689 Other abnormalities of breathing: Secondary | ICD-10-CM | POA: Diagnosis not present

## 2023-07-22 DIAGNOSIS — R531 Weakness: Secondary | ICD-10-CM | POA: Diagnosis present

## 2023-07-22 LAB — BASIC METABOLIC PANEL
Anion gap: 6 (ref 5–15)
BUN: 9 mg/dL (ref 6–20)
CO2: 27 mmol/L (ref 22–32)
Calcium: 9.3 mg/dL (ref 8.9–10.3)
Chloride: 103 mmol/L (ref 98–111)
Creatinine, Ser: 0.65 mg/dL (ref 0.44–1.00)
GFR, Estimated: >60 mL/min (ref >60)
Glucose, Bld: 99 mg/dL (ref 70–99)
Potassium: 4.3 mmol/L (ref 3.5–5.1)
Sodium: 136 mmol/L (ref 135–145)

## 2023-07-22 LAB — CBC WITH DIFFERENTIAL/PLATELET
Abs Immature Granulocytes: 0.01 10*3/uL (ref 0.00–0.07)
Basophils Absolute: 0.1 10*3/uL (ref 0.0–0.1)
Basophils Relative: 1 %
Eosinophils Absolute: 0.2 10*3/uL (ref 0.0–0.5)
Eosinophils Relative: 3 %
HCT: 39.2 % (ref 36.0–46.0)
Hemoglobin: 12.8 g/dL (ref 12.0–15.0)
Immature Granulocytes: 0 %
Lymphocytes Relative: 51 %
Lymphs Abs: 4 10*3/uL (ref 0.7–4.0)
MCH: 30.4 pg (ref 26.0–34.0)
MCHC: 32.7 g/dL (ref 30.0–36.0)
MCV: 93.1 fL (ref 80.0–100.0)
Monocytes Absolute: 0.4 10*3/uL (ref 0.1–1.0)
Monocytes Relative: 6 %
Neutro Abs: 3 10*3/uL (ref 1.7–7.7)
Neutrophils Relative %: 39 %
Platelets: 246 10*3/uL (ref 150–400)
RBC: 4.21 MIL/uL (ref 3.87–5.11)
RDW: 12.8 % (ref 11.5–15.5)
WBC: 7.7 10*3/uL (ref 4.0–10.5)
nRBC: 0 % (ref 0.0–0.2)

## 2023-07-22 NOTE — ED Triage Notes (Signed)
Pt arrived POV from the doctors office stating she was dehydrated, weak and she is not able to eat or drink. Pt is also having dental pain due to the cold air.

## 2023-07-22 NOTE — ED Provider Triage Note (Cosign Needed)
Emergency Medicine Provider Triage Evaluation Note  Monica Hopkins , a 52 y.o. female  was evaluated in triage.  Pt complains of failure to thrive.  Sent in by PCP.  History of cardiac arrest and severe dental decay.  She has had difficulty being able to eat or drink and has had rapid weight loss.  Sent in by her PCP. Has chronic severe pain Review of Systems  Positive: Dental decay and poor oral intake Negative: Fever  Physical Exam  BP 122/85 (BP Location: Right Arm)   Pulse 81   Temp 98.4 F (36.9 C)   Resp 18   Ht 5\' 7"  (1.702 m)   Wt 63.5 kg   SpO2 100%   BMI 21.93 kg/m  Gen:   Awake, no distress   Resp:  Normal effort  MSK:   Moves extremities without difficulty  Other:  Poor dentition  Medical Decision Making  Medically screening exam initiated at 1:31 PM.  Appropriate orders placed.  Monica Hopkins was informed that the remainder of the evaluation will be completed by another provider, this initial triage assessment does not replace that evaluation, and the importance of remaining in the ED until their evaluation is complete.     Arthor Captain, PA-C 07/22/23 1334

## 2023-07-23 ENCOUNTER — Other Ambulatory Visit: Payer: Self-pay

## 2023-07-23 ENCOUNTER — Emergency Department (HOSPITAL_COMMUNITY)
Admission: EM | Admit: 2023-07-23 | Discharge: 2023-07-23 | Discharge status: Against Medical Advice | Payer: Medicaid Other | Source: Home / Self Care | Attending: Emergency Medicine | Admitting: Emergency Medicine

## 2023-07-23 ENCOUNTER — Encounter (HOSPITAL_COMMUNITY): Payer: Self-pay

## 2023-07-23 ENCOUNTER — Emergency Department (HOSPITAL_COMMUNITY): Payer: Medicaid Other

## 2023-07-23 DIAGNOSIS — R531 Weakness: Secondary | ICD-10-CM

## 2023-07-23 DIAGNOSIS — E860 Dehydration: Secondary | ICD-10-CM | POA: Insufficient documentation

## 2023-07-23 DIAGNOSIS — K029 Dental caries, unspecified: Secondary | ICD-10-CM | POA: Insufficient documentation

## 2023-07-23 DIAGNOSIS — K089 Disorder of teeth and supporting structures, unspecified: Secondary | ICD-10-CM

## 2023-07-23 DIAGNOSIS — R0689 Other abnormalities of breathing: Secondary | ICD-10-CM | POA: Insufficient documentation

## 2023-07-23 LAB — URINALYSIS, ROUTINE W REFLEX MICROSCOPIC
Bilirubin Urine: NEGATIVE
Glucose, UA: NEGATIVE mg/dL
Hgb urine dipstick: NEGATIVE
Ketones, ur: 5 mg/dL — AB
Leukocytes,Ua: NEGATIVE
Nitrite: NEGATIVE
Protein, ur: 100 mg/dL — AB
Specific Gravity, Urine: 1.024 (ref 1.005–1.030)
pH: 5 (ref 5.0–8.0)

## 2023-07-23 LAB — COMPREHENSIVE METABOLIC PANEL
ALT: 21 U/L (ref 0–44)
AST: 20 U/L (ref 15–41)
Albumin: 3.7 g/dL (ref 3.5–5.0)
Alkaline Phosphatase: 61 U/L (ref 38–126)
Anion gap: 8 (ref 5–15)
BUN: 11 mg/dL (ref 6–20)
CO2: 31 mmol/L (ref 22–32)
Calcium: 9.4 mg/dL (ref 8.9–10.3)
Chloride: 98 mmol/L (ref 98–111)
Creatinine, Ser: 0.79 mg/dL (ref 0.44–1.00)
GFR, Estimated: >60 mL/min (ref >60)
Glucose, Bld: 106 mg/dL — ABNORMAL HIGH (ref 70–99)
Potassium: 4.4 mmol/L (ref 3.5–5.1)
Sodium: 137 mmol/L (ref 135–145)
Total Bilirubin: 0.4 mg/dL (ref <1.2)
Total Protein: 6.3 g/dL — ABNORMAL LOW (ref 6.5–8.1)

## 2023-07-23 LAB — HCG, SERUM, QUALITATIVE: Preg, Serum: NEGATIVE

## 2023-07-23 LAB — CBC WITH DIFFERENTIAL/PLATELET
Abs Immature Granulocytes: 0.01 10*3/uL (ref 0.00–0.07)
Basophils Absolute: 0.1 10*3/uL (ref 0.0–0.1)
Basophils Relative: 1 %
Eosinophils Absolute: 0.2 10*3/uL (ref 0.0–0.5)
Eosinophils Relative: 3 %
HCT: 40.7 % (ref 36.0–46.0)
Hemoglobin: 12.9 g/dL (ref 12.0–15.0)
Immature Granulocytes: 0 %
Lymphocytes Relative: 49 %
Lymphs Abs: 3.5 10*3/uL (ref 0.7–4.0)
MCH: 29.9 pg (ref 26.0–34.0)
MCHC: 31.7 g/dL (ref 30.0–36.0)
MCV: 94.4 fL (ref 80.0–100.0)
Monocytes Absolute: 0.4 10*3/uL (ref 0.1–1.0)
Monocytes Relative: 6 %
Neutro Abs: 2.9 10*3/uL (ref 1.7–7.7)
Neutrophils Relative %: 41 %
Platelets: 217 10*3/uL (ref 150–400)
RBC: 4.31 MIL/uL (ref 3.87–5.11)
RDW: 12.7 % (ref 11.5–15.5)
WBC: 7 10*3/uL (ref 4.0–10.5)
nRBC: 0 % (ref 0.0–0.2)

## 2023-07-23 LAB — PREGNANCY, URINE: Preg Test, Ur: NEGATIVE

## 2023-07-23 LAB — BRAIN NATRIURETIC PEPTIDE: B Natriuretic Peptide: 18 pg/mL (ref 0.0–100.0)

## 2023-07-23 LAB — CBG MONITORING, ED: Glucose-Capillary: 97 mg/dL (ref 70–99)

## 2023-07-23 MED ORDER — ONDANSETRON HCL 4 MG/2ML IJ SOLN
4.0000 mg | Freq: Once | INTRAMUSCULAR | Status: AC
  Administered 2023-07-23: 4 mg via INTRAVENOUS
  Filled 2023-07-23: qty 2

## 2023-07-23 MED ORDER — KETOROLAC TROMETHAMINE 30 MG/ML IJ SOLN
15.0000 mg | Freq: Once | INTRAMUSCULAR | Status: AC
  Administered 2023-07-23: 15 mg via INTRAVENOUS
  Filled 2023-07-23: qty 1

## 2023-07-23 MED ORDER — MORPHINE SULFATE (PF) 4 MG/ML IV SOLN
4.0000 mg | Freq: Once | INTRAVENOUS | Status: AC
Start: 2023-07-23 — End: 2023-07-23
  Administered 2023-07-23: 4 mg via INTRAVENOUS
  Filled 2023-07-23: qty 1

## 2023-07-23 MED ORDER — SODIUM CHLORIDE 0.9 % IV BOLUS: 1000.0000 mL | Freq: Once | INTRAVENOUS | Status: DC

## 2023-07-23 MED ORDER — DEXTROSE-SODIUM CHLORIDE 5-0.9 % IV SOLN: INTRAVENOUS | Status: DC

## 2023-07-23 NOTE — ED Notes (Signed)
Pt name called 3x at 0348.

## 2023-07-23 NOTE — ED Provider Notes (Signed)
[facility] AT [facility] Provider Note   CSN: [id number] Arrival date & time: 07/23/23  1252     History  Chief Complaint  Patient presents with   Weakness    Monica Hopkins is a 52 y.o. female.  With a history of poor dentition, malnutrition and cardiac arrest who presents to the ED for generalized weakness.  Patient was scheduled to have multiple teeth removed in the setting of severe dental caries.  She only had some of the teeth removed recently.  Since then has had difficulty tolerating p.o. intake with persistent nausea and dental pain.  Last meal was over 2 days ago.  Has lost weight and feels very weak.  Voices concern for dehydration and malnutrition.  Directed to the ED by her primary care doctor given concern for dehydration at home   Weakness      Home Medications Prior to Admission medications   Medication Sig Start Date End Date Taking? Authorizing Provider  divalproex (DEPAKOTE) 250 MG DR tablet Take 250 mg by mouth 2 (two) times daily. 06/08/23  Yes [provider]  Oxycodone HCl 20 MG TABS Take 20 mg by mouth 4 (four) times daily. 07/23/23  Yes [provider]  albuterol (PROVENTIL HFA) 108 (90 Base) MCG/ACT inhaler Inhale 1-2 puffs into the lungs every 6 (six) hours as needed for wheezing or shortness of breath.    [provider]  amoxicillin-clavulanate (AUGMENTIN) 875-125 MG tablet Take 1 tablet by mouth every 12 (twelve) hours. 07/03/23   Durwin Glaze, MD  clonazePAM (KLONOPIN) 0.5 MG tablet Take one tab twice a daily and 3rd needed for severe anxiety Patient taking differently: Take 0.5 mg by mouth 2 (two) times daily as needed for anxiety. May take 1 additional tablet if needed for anxiety. 03/10/22   Arfeen, Phillips Grout, MD  gabapentin (NEURONTIN) 300 MG capsule Take 1 capsule (300 mg total) by mouth 2 (two) times daily. 06/25/22   Lonia Blood, MD  lidocaine (XYLOCAINE) 2 % solution Use as directed 15  mLs in the mouth or throat every 4 (four) hours as needed for mouth pain (swish and spit). 01/17/23   Kommor, Madison, MD  polyethylene glycol (MIRALAX / GLYCOLAX) 17 g packet Take 17 g by mouth daily as needed for moderate constipation. 06/25/22   Lonia Blood, MD  senna-docusate (SENOKOT-S) 8.6-50 MG tablet Take 1 tablet by mouth at bedtime. 06/25/22   Lonia Blood, MD  traZODone (DESYREL) 50 MG tablet Take 1 tablet (50 mg total) by mouth at bedtime as needed for sleep. 06/25/22   Lonia Blood, MD  valproic acid (DEPAKENE) 250 MG capsule Take 2 capsules (500 mg total) by mouth 2 (two) times daily. 06/25/22   Lonia Blood, MD      Allergies    Abilify [aripiprazole], Lamictal [lamotrigine], Latuda [lurasidone], and Seroquel [quetiapine]    Review of Systems   Review of Systems  Neurological:  Positive for weakness.    Physical Exam Updated Vital Signs BP (!) 140/87   Pulse (!) 51   Temp 98.1 F (36.7 C) (Oral)   Resp 14   Ht 5\' 7"  (1.702 m)   Wt 64.4 kg   SpO2 97%   BMI 22.24 kg/m  Physical Exam Vitals and nursing note reviewed.  HENT:     Head: Normocephalic and atraumatic.     Mouth/Throat:     Comments: Status post partial dental extraction with multiple dental caries No  abscess Eyes:     Pupils: Pupils are equal, round, and reactive to light.  Cardiovascular:     Rate and Rhythm: Normal rate and regular rhythm.  Pulmonary:     Effort: Pulmonary effort is normal.     Breath sounds: Normal breath sounds.  Abdominal:     Palpations: Abdomen is soft.     Tenderness: There is no abdominal tenderness.  Skin:    General: Skin is warm and dry.  Neurological:     Mental Status: She is alert.  Psychiatric:        Mood and Affect: Mood normal.     ED Results / Procedures / Treatments   Labs (all labs ordered are listed, but only abnormal results are displayed) Labs Reviewed  COMPREHENSIVE METABOLIC PANEL - Abnormal; Notable for the following  components:      Result Value   Glucose, Bld 106 (*)    Total Protein 6.3 (*)    All other components within normal limits  URINALYSIS, ROUTINE W REFLEX MICROSCOPIC - Abnormal; Notable for the following components:   Color, Urine AMBER (*)    APPearance HAZY (*)    Ketones, ur 5 (*)    Protein, ur 100 (*)    Bacteria, UA RARE (*)    All other components within normal limits  CBC WITH DIFFERENTIAL/PLATELET  PREGNANCY, URINE  BRAIN NATRIURETIC PEPTIDE  HCG, SERUM, QUALITATIVE  CBG MONITORING, ED    EKG None  Radiology DG Chest Portable 1 View Result Date: 07/23/2023 CLINICAL DATA:  Decreased appetite with dehydration and weight loss. EXAM: PORTABLE CHEST 1 VIEW COMPARISON:  June 24, 2022 FINDINGS: The heart size and mediastinal contours are within normal limits. There is no evidence of an acute infiltrate, pleural effusion or pneumothorax. The visualized skeletal structures are unremarkable. IMPRESSION: No active disease. Electronically Signed   By: Aram Candela M.D.   On: 07/23/2023 20:54    Procedures Procedures    Medications Ordered in ED Medications  dextrose 5 %-0.9 % sodium chloride infusion (0 mLs Intravenous Stopped 07/23/23 2255)  ketorolac (TORADOL) 30 MG/ML injection 15 mg (15 mg Intravenous Given 07/23/23 2106)  morphine (PF) 4 MG/ML injection 4 mg (4 mg Intravenous Given 07/23/23 2107)  ondansetron (ZOFRAN) injection 4 mg (4 mg Intravenous Given 07/23/23 2107)    ED Course/ Medical Decision Making/ A&P Clinical Course as of 07/23/23 2258  Fri Jul 23, 2023  2254 Laboratory workup unremarkable overall.  Ketonuria.  Pregnancy negative.  Patient was remained hemodynamically stable.  No evidence of dysrhythmia on EKG. chest x-ray shows no active disease.  Reviewed these results with the patient and her husband.  She expresses her discontentment stating that she is "not getting better".  Many of her issues stem from her poor dentition which has been going  on for a long time.  I explained that definitive management could only be achieved through dentistry/oral surgery.  I have no medical indication for admission at this time.  She and her husband were unhappy that she would be sent home.  She has opioid analgesics for her dental pain at home and will follow-up with her PCP to discuss nutritional intake [MP]    Clinical Course User Index [MP] Royanne Foots, DO                                 Medical Decision Making 52 year old female with history as above presenting  for generalized weakness and concern for malnutrition in the setting of p.o. intake due to nausea and dental pain.  Very poor dentition and recently underwent partial extraction.  Remaining teeth are still painful making it difficult to eat.  Other than poor dentition benign physical exam.  Will provide IV fluids with dextrose supplementation, obtain laboratory workup, Zofran for nausea, Toradol and morphine for pain.  Planning for rehydration and working towards p.o. intake here.  If she is unable to tolerate p.o. intake she may require admission  Amount and/or Complexity of Data Reviewed Labs: ordered. Radiology: ordered.  Risk Prescription drug management.           Final Clinical Impression(s) / ED Diagnoses Final diagnoses:  Weakness  Dehydration  Poor dentition    Rx / DC Orders ED Discharge Orders     None         Royanne Foots, DO 07/23/23 2258

## 2023-07-23 NOTE — ED Notes (Signed)
ED Provider at bedside. 

## 2023-07-23 NOTE — ED Notes (Signed)
Pt name called 2x at [phone]

## 2023-07-23 NOTE — Discharge Instructions (Signed)
You are seen in the Emergency Department for dehydration and poor intake We gave you IV fluids and checked your labs and EKG and chest x-ray Your results look okay here and your blood pressure and heart rate were normal Unfortunately many of the issues stem from the dental problems Please follow-up with your primary care doctor and dentist to discuss definitive management Return to the emerged part for trouble breathing chest pain or severe weakness

## 2023-07-23 NOTE — ED Triage Notes (Signed)
Decreased app Malnutrition  Pt stated she is dehydrated  Last time she ate was 2 days ago Taking sips of water and coffee only  Rapid weight loss Rooten teeth

## 2023-09-21 ENCOUNTER — Ambulatory Visit: Payer: Medicaid Other | Attending: Internal Medicine | Admitting: Internal Medicine

## 2023-09-21 ENCOUNTER — Encounter: Payer: Self-pay | Admitting: Internal Medicine

## 2023-09-21 VITALS — BP 116/64 | HR 82 | Ht 67.0 in | Wt 142.2 lb

## 2023-09-21 DIAGNOSIS — I469 Cardiac arrest, cause unspecified: Secondary | ICD-10-CM | POA: Insufficient documentation

## 2023-09-21 NOTE — Patient Instructions (Signed)
 Medication Instructions:  Continue same medications   Lab Work: None ordered   Testing/Procedures: None ordered   Follow-Up: At [facility], you and your health needs are our priority.  As part of our continuing mission to provide you with exceptional heart care, we have created designated Provider Care Teams.  These Care Teams include your primary Cardiologist (physician) and Advanced Practice Providers (APPs -  Physician Assistants and Nurse Practitioners) who all work together to provide you with the care you need, when you need it.  We recommend signing up for the patient portal called "MyChart".  Sign up information is provided on this After Visit Summary.  MyChart is used to connect with patients for Virtual Visits (Telemedicine).  Patients are able to view lab/test results, encounter notes, upcoming appointments, etc.  Non-urgent messages can be sent to your provider as well.   To learn more about what you can do with MyChart, go to [email or web address].    Your next appointment:  As needed    Provider:  Dr.Branch

## 2023-09-21 NOTE — Progress Notes (Signed)
  Cardiology Office Note:  .   Date:  09/21/2023  ID:  [name], DOB April 27, 1971, MRN [id number] PCP: Kaleen Mask, MD  [facility] Providers Cardiologist:  None    History of Present Illness: .   Frankie Zito is a 53 y.o. female with hx of PEA arrest due to hypoxemia in the setting of strep PNA 06/2022. Significant malnutrition. Chronic opoid use. She was seen in the ED at [facility]. She came in with complications post dental extraction. She was dehydrated. She was referred to cardiology for pre-op per patient.  Prior echo 2023 shows EF that's normal. Moderate PHTN. Mild MR.  Today, she can go up stairs and denies CP or SOB. No hx of cardiac disease. Her mother has afib.  ROS:  per HPI otherwise negative   Studies Reviewed: Marland Kitchen        EKG Interpretation Date/Time:  Tuesday September 21 2023 13:39:07 EST Ventricular Rate:  82 PR Interval:  166 QRS Duration:  96 QT Interval:  390 QTC Calculation: 455 R Axis:   -20  Text Interpretation: Normal sinus rhythm Low voltage QRS Poor R wave progression When compared with ECG of 23-Jul-2023 20:32, No significant change since last tracing Confirmed by Carolan Clines ([id number]) on 09/21/2023 1:48:14 PM  Risk Assessment/Calculations:        Physical Exam:   VS:   Vitals:   09/21/23 1332  BP: 116/64  Pulse: 82  SpO2: 97%    Wt Readings from Last 3 Encounters:  07/23/23 142 lb (64.4 kg)  07/22/23 140 lb (63.5 kg)  07/11/23 147 lb (66.7 kg)    GEN: Well nourished, well developed in no acute distress NECK: No JVD; CARDIAC: RRR, no murmurs, rubs, gallops RESPIRATORY:  Clear to auscultation without rales, wheezing or rhonchi  ABDOMEN: Soft, non-tender, non-distended EXTREMITIES:  No edema; No deformity   ASSESSMENT AND PLAN: .   PEA arrest due to hypoxia She had a non cardiac cause for PEA arrest. No need to follow with cardiology.  Pulmonary HTN Likely in the setting of PNA  PreOP She can do > 4 METS without  symptoms. She has normal blood pressure. She is acceptable cardiac risk for her dental work/pain medications       Dispo: Follow PRN  Signed, Maisie Fus, MD

## 2023-09-25 ENCOUNTER — Encounter (HOSPITAL_COMMUNITY): Payer: Self-pay

## 2023-09-25 ENCOUNTER — Emergency Department (HOSPITAL_COMMUNITY): Payer: Medicaid Other

## 2023-09-25 ENCOUNTER — Emergency Department (HOSPITAL_COMMUNITY)
Admission: EM | Admit: 2023-09-25 | Discharge: 2023-09-25 | Disposition: A | Discharge status: Home / Self Care | Payer: Medicaid Other | Attending: Emergency Medicine | Admitting: Emergency Medicine

## 2023-09-25 ENCOUNTER — Other Ambulatory Visit: Payer: Self-pay

## 2023-09-25 DIAGNOSIS — R531 Weakness: Secondary | ICD-10-CM | POA: Diagnosis not present

## 2023-09-25 DIAGNOSIS — K089 Disorder of teeth and supporting structures, unspecified: Secondary | ICD-10-CM | POA: Insufficient documentation

## 2023-09-25 DIAGNOSIS — K047 Periapical abscess without sinus: Secondary | ICD-10-CM | POA: Insufficient documentation

## 2023-09-25 DIAGNOSIS — R070 Pain in throat: Secondary | ICD-10-CM | POA: Diagnosis present

## 2023-09-25 DIAGNOSIS — R1310 Dysphagia, unspecified: Secondary | ICD-10-CM | POA: Diagnosis not present

## 2023-09-25 HISTORY — DX: Cardiac arrest, cause unspecified: I46.9

## 2023-09-25 LAB — COMPREHENSIVE METABOLIC PANEL
ALT: 14 U/L (ref 0–44)
AST: 12 U/L — ABNORMAL LOW (ref 15–41)
Albumin: 4 g/dL (ref 3.5–5.0)
Alkaline Phosphatase: 56 U/L (ref 38–126)
Anion gap: 10 (ref 5–15)
BUN: 9 mg/dL (ref 6–20)
CO2: 26 mmol/L (ref 22–32)
Calcium: 9.7 mg/dL (ref 8.9–10.3)
Chloride: 102 mmol/L (ref 98–111)
Creatinine, Ser: 0.61 mg/dL (ref 0.44–1.00)
GFR, Estimated: >60 mL/min (ref >60)
Glucose, Bld: 97 mg/dL (ref 70–99)
Potassium: 4.2 mmol/L (ref 3.5–5.1)
Sodium: 138 mmol/L (ref 135–145)
Total Bilirubin: 0.8 mg/dL (ref 0.0–1.2)
Total Protein: 6.6 g/dL (ref 6.5–8.1)

## 2023-09-25 LAB — RESP PANEL BY RT-PCR (RSV, FLU A&B, COVID)  RVPGX2
Influenza A by PCR: NEGATIVE
Influenza B by PCR: NEGATIVE
Resp Syncytial Virus by PCR: NEGATIVE
SARS Coronavirus 2 by RT PCR: NEGATIVE

## 2023-09-25 LAB — CBC WITH DIFFERENTIAL/PLATELET
Abs Immature Granulocytes: 0.02 10*3/uL (ref 0.00–0.07)
Basophils Absolute: 0.1 10*3/uL (ref 0.0–0.1)
Basophils Relative: 1 %
Eosinophils Absolute: 0.1 10*3/uL (ref 0.0–0.5)
Eosinophils Relative: 1 %
HCT: 42.2 % (ref 36.0–46.0)
Hemoglobin: 13.2 g/dL (ref 12.0–15.0)
Immature Granulocytes: 0 %
Lymphocytes Relative: 47 %
Lymphs Abs: 4.1 10*3/uL — ABNORMAL HIGH (ref 0.7–4.0)
MCH: 30.1 pg (ref 26.0–34.0)
MCHC: 31.3 g/dL (ref 30.0–36.0)
MCV: 96.1 fL (ref 80.0–100.0)
Monocytes Absolute: 0.5 10*3/uL (ref 0.1–1.0)
Monocytes Relative: 6 %
Neutro Abs: 3.8 10*3/uL (ref 1.7–7.7)
Neutrophils Relative %: 45 %
Platelets: 306 10*3/uL (ref 150–400)
RBC: 4.39 MIL/uL (ref 3.87–5.11)
RDW: 13.2 % (ref 11.5–15.5)
WBC: 8.5 10*3/uL (ref 4.0–10.5)
nRBC: 0 % (ref 0.0–0.2)

## 2023-09-25 LAB — CBG MONITORING, ED: Glucose-Capillary: 93 mg/dL (ref 70–99)

## 2023-09-25 MED ORDER — AMOXICILLIN-POT CLAVULANATE 875-125 MG PO TABS: 1.0000 | ORAL_TABLET | Freq: Two times a day (BID) | ORAL | 0 refills | Status: DC

## 2023-09-25 MED ORDER — ACETAMINOPHEN 500 MG PO TABS
1000.0000 mg | ORAL_TABLET | Freq: Once | ORAL | Status: DC
  Filled 2023-09-25: qty 2

## 2023-09-25 MED ORDER — ALUM & MAG HYDROXIDE-SIMETH 200-200-20 MG/5ML PO SUSP: 30.0000 mL | Freq: Once | ORAL | Status: DC

## 2023-09-25 MED ORDER — METOCLOPRAMIDE HCL 5 MG/ML IJ SOLN
10.0000 mg | Freq: Once | INTRAMUSCULAR | Status: AC
  Administered 2023-09-25: 10 mg via INTRAVENOUS
  Filled 2023-09-25: qty 2

## 2023-09-25 MED ORDER — DIPHENHYDRAMINE HCL 50 MG/ML IJ SOLN
12.5000 mg | Freq: Once | INTRAMUSCULAR | Status: AC
  Administered 2023-09-25: 12.5 mg via INTRAVENOUS
  Filled 2023-09-25: qty 1

## 2023-09-25 MED ORDER — LIDOCAINE VISCOUS HCL 2 % MT SOLN
15.0000 mL | Freq: Once | OROMUCOSAL | Status: AC
  Administered 2023-09-25: 15 mL via ORAL
  Filled 2023-09-25: qty 15

## 2023-09-25 MED ORDER — IOHEXOL 300 MG/ML  SOLN
75.0000 mL | Freq: Once | INTRAMUSCULAR | Status: AC | PRN
  Administered 2023-09-25: 75 mL via INTRAVENOUS

## 2023-09-25 NOTE — Discharge Instructions (Addendum)
 Your CT scan showed no evidence of odontogenic infection. We will discharge you on a course of antibiotics. Recommend you follow-up with your PCP and dentist for further management.

## 2023-09-25 NOTE — ED Triage Notes (Signed)
 Pt has swollen lymph nodes and a sore throat. Pt was having teeth removed due to infections but it was paused for her to be cleared by cardiology. Pt has not eaten well in 2 weeks due to swelling and pain.

## 2023-09-25 NOTE — ED Provider Notes (Signed)
 [place] EMERGENCY DEPARTMENT AT [facility] Provider Note   CSN: [id number] Arrival date & time: 09/25/23  1515     History  Chief Complaint  Patient presents with   Oral Swelling    Monica Hopkins is a 53 y.o. female.  HPI   53 year old female with medical history significant for poor dentition, cardiac arrest likely from electrolyte abnormality in the setting of malnutrition presenting to the emergency department with throat pain and swelling.  The patient states that she followed outpatient with dentistry and had multiple teeth pulled but do not have all of her teeth pulled.  She has not been eating well over the past 2 weeks due to pain and swelling in her throat and neck.  She endorses bilateral tender lymph nodes.  She endorses difficulty swallowing.  No fevers or chills.  She is not currently on any antibiotics.  Home Medications Prior to Admission medications   Medication Sig Start Date End Date Taking? Authorizing Provider  amoxicillin-clavulanate (AUGMENTIN) 875-125 MG tablet Take 1 tablet by mouth every 12 (twelve) hours. 09/25/23  Yes Ernie Avena, MD  albuterol (PROVENTIL HFA) 108 (90 Base) MCG/ACT inhaler Inhale 1-2 puffs into the lungs as needed for wheezing or shortness of breath.    [provider]  clonazePAM (KLONOPIN) 0.5 MG tablet Take one tab twice a daily and 3rd needed for severe anxiety Patient taking differently: Take 0.5 mg by mouth 2 (two) times daily as needed for anxiety. 03/10/22   Arfeen, Phillips Grout, MD  divalproex (DEPAKOTE) 250 MG DR tablet Take 250 mg by mouth 2 (two) times daily. 06/08/23   [provider]  fentaNYL (DURAGESIC) 12 MCG/HR Place 1 patch onto the skin every 3 (three) days. 07/26/23   [provider]  gabapentin (NEURONTIN) 300 MG capsule Take 1 capsule (300 mg total) by mouth 2 (two) times daily. 06/25/22   Lonia Blood, MD  lidocaine (XYLOCAINE) 2 % solution Use as directed 15 mLs in the mouth or  throat every 4 (four) hours as needed for mouth pain (swish and spit). Patient not taking: Reported on 09/21/2023 01/17/23   Kommor, Wyn Forster, MD  midodrine (PROAMATINE) 10 MG tablet Take 10 mg by mouth every 8 (eight) hours.    [provider]  Oxycodone HCl 20 MG TABS Take 20 mg by mouth 4 (four) times daily. 07/23/23   [provider]  polyethylene glycol (MIRALAX / GLYCOLAX) 17 g packet Take 17 g by mouth daily as needed for moderate constipation. 06/25/22   Lonia Blood, MD  senna-docusate (SENOKOT-S) 8.6-50 MG tablet Take 1 tablet by mouth at bedtime. 06/25/22   Lonia Blood, MD  traZODone (DESYREL) 100 MG tablet Take 100 mg by mouth at bedtime.    [provider]  valproic acid (DEPAKENE) 250 MG capsule Take 2 capsules (500 mg total) by mouth 2 (two) times daily. 06/25/22   Lonia Blood, MD      Allergies    Abilify [aripiprazole], Lamictal [lamotrigine], Latuda [lurasidone], and Seroquel [quetiapine]    Review of Systems   Review of Systems  All other systems reviewed and are negative.   Physical Exam Updated Vital Signs BP 104/77   Pulse (!) 51   Temp 98.4 F (36.9 C) (Oral)   Resp 15   SpO2 94%  Physical Exam Vitals and nursing note reviewed.  Constitutional:      General: She is not in acute distress.    Appearance: She is well-developed.  HENT:     Head: Normocephalic and atraumatic.     Mouth/Throat:     Comments: Poor dentition throughout Eyes:     Conjunctiva/sclera: Conjunctivae normal.  Cardiovascular:     Rate and Rhythm: Normal rate and regular rhythm.  Pulmonary:     Effort: Pulmonary effort is normal. No respiratory distress.     Breath sounds: Normal breath sounds.  Abdominal:     Palpations: Abdomen is soft.     Tenderness: There is no abdominal tenderness.  Musculoskeletal:        General: No swelling.     Cervical back: Normal range of motion and neck supple. No rigidity or tenderness.  Skin:     General: Skin is warm and dry.     Capillary Refill: Capillary refill takes less than 2 seconds.  Neurological:     Mental Status: She is alert.     Cranial Nerves: Cranial nerves 2-12 are intact.     Sensory: Sensation is intact.     Motor: Motor function is intact.     Comments: Slight weakness in the left upper extremity at baseline for the patient.  Psychiatric:        Mood and Affect: Mood normal.     ED Results / Procedures / Treatments   Labs (all labs ordered are listed, but only abnormal results are displayed) Labs Reviewed  CBC WITH DIFFERENTIAL/PLATELET - Abnormal; Notable for the following components:      Result Value   Lymphs Abs 4.1 (*)    All other components within normal limits  COMPREHENSIVE METABOLIC PANEL - Abnormal; Notable for the following components:   AST 12 (*)    All other components within normal limits  RESP PANEL BY RT-PCR (RSV, FLU A&B, COVID)  RVPGX2  CBG MONITORING, ED    EKG None  Radiology CT Soft Tissue Neck W Contrast Result Date: 09/25/2023 CLINICAL DATA:  Sore throat and swollen lymph nodes EXAM: CT NECK WITH CONTRAST TECHNIQUE: Multidetector CT imaging of the neck was performed using the standard protocol following the bolus administration of intravenous contrast. RADIATION DOSE REDUCTION: This exam was performed according to the departmental dose-optimization program which includes automated exposure control, adjustment of the mA and/or kV according to patient size and/or use of iterative reconstruction technique. CONTRAST:  75mL OMNIPAQUE IOHEXOL 300 MG/ML  SOLN COMPARISON:  None Available. FINDINGS: PHARYNX AND LARYNX: The nasopharynx, oropharynx and larynx are normal. Visible portions of the oral cavity, tongue base and floor of mouth are normal. Normal epiglottis, vallecula and pyriform sinuses. The larynx is normal. No retropharyngeal abscess, effusion or lymphadenopathy. SALIVARY GLANDS: Normal parotid, submandibular and sublingual  glands. THYROID: Normal. LYMPH NODES: No enlarged or abnormal density lymph nodes. VASCULAR: Major cervical vessels are patent. LIMITED INTRACRANIAL: Normal. VISUALIZED ORBITS: Normal. MASTOIDS AND VISUALIZED PARANASAL SINUSES: No fluid levels or advanced mucosal thickening. No mastoid effusion. SKELETON: C5-6 intervertebral fusion device. UPPER CHEST: Clear. OTHER: None. IMPRESSION: Normal CT of the neck. No abscess or lymphadenopathy. Electronically Signed   By: Deatra Robinson M.D.   On: 09/25/2023 19:46    Procedures Procedures    Medications Ordered in ED Medications  lidocaine (XYLOCAINE) 2 % viscous mouth solution 15 mL (15 mLs Oral Given 09/25/23 1704)  iohexol (OMNIPAQUE) 300 MG/ML solution 75 mL (75 mLs Intravenous Contrast Given 09/25/23 1725)  metoCLOPramide (REGLAN) injection 10 mg (10 mg Intravenous Given 09/25/23 1834)  diphenhydrAMINE (BENADRYL) injection 12.5 mg (12.5 mg Intravenous Given 09/25/23 1834)  ED Course/ Medical Decision Making/ A&P                                 Medical Decision Making Amount and/or Complexity of Data Reviewed Labs: ordered. Radiology: ordered.  Risk OTC drugs. Prescription drug management.    53 year old female with medical history significant for poor dentition, cardiac arrest likely from electrolyte abnormality in the setting of malnutrition presenting to the emergency department with throat pain and swelling.  The patient states that she followed outpatient with dentistry and had multiple teeth pulled but do not have all of her teeth pulled.  She has not been eating well over the past 2 weeks due to pain and swelling in her throat and neck.  She endorses bilateral tender lymph nodes.  She endorses difficulty swallowing.  No fevers or chills.  She is not currently on any antibiotics.  On arrival, the patient was afebrile, vitally stable.  Patient presenting with a sore throat and oropharyngeal dysphagia that has been chronic.  He had had  previous teeth pulled due to poor dentition and history of odontogenic infections.  Has had decreased p.o. intake in the last 2 weeks due to self-reported swelling and oropharyngeal dysphagia.  Laboratory evaluation performed in addition to CT imaging of the neck.  Blood glucose was normal, CMP was unremarkable in addition to CBC and COVID-19 influenza and RSV PCR testing which resulted negative.  A CT of the neck was performed which was ultimately unremarkable.  The patient's symptoms of dysphagia are chronic.  Low concern for acute CVA at this time.  Her neuroexam is at baseline.  The patient passed a normal swallow and has no electrolyte abnormalities and appears well-hydrated.  Given her chronic oropharyngeal dysphagia, I recommended outpatient follow-up with ENT and neurology.  Return precautions provided in the event of any concern for dehydration or electrolyte abnormality or any other concerns or complaints.  Will discharge on a course of Augmentin given her poor dentition and dental pain, advised outpatient follow-up with her PCP and dentist as well.   Final Clinical Impression(s) / ED Diagnoses Final diagnoses:  Dental infection  Poor dentition  Dysphagia, unspecified type    Rx / DC Orders ED Discharge Orders          Ordered    amoxicillin-clavulanate (AUGMENTIN) 875-125 MG tablet  Every 12 hours        09/25/23 2001    Ambulatory referral to ENT        09/25/23 2044    Ambulatory referral to Neurology       Comments: An appointment is requested in approximately: 4 weeks   09/25/23 2044              Ernie Avena, MD 09/25/23 2045

## 2023-09-30 ENCOUNTER — Encounter: Payer: Medicaid Other | Attending: Family Medicine | Admitting: Skilled Nursing Facility1

## 2023-09-30 ENCOUNTER — Encounter: Payer: Self-pay | Admitting: Skilled Nursing Facility1

## 2023-09-30 VITALS — Ht 67.0 in | Wt 140.7 lb

## 2023-09-30 DIAGNOSIS — R1310 Dysphagia, unspecified: Secondary | ICD-10-CM | POA: Insufficient documentation

## 2023-09-30 DIAGNOSIS — R634 Abnormal weight loss: Secondary | ICD-10-CM | POA: Diagnosis present

## 2023-09-30 NOTE — Progress Notes (Signed)
 Medical Nutrition Therapy  Appointment Start time:  4:30  Appointment End time:  5:30  Primary concerns today: loss of weight   Referral diagnosis: r63.4   NUTRITION ASSESSMENT    Clinical Medical Hx: Cardiac Arrest  Medications: see list Labs: WNL Notable Signs/Symptoms: none reported   Lifestyle & Dietary Hx  Pt appears with hair loss, sunken eyes, hair thinning.   Pt states in 2021 she started the lose weight. Pt states she was supposed to have all of her teeth removed but left behind a lot of teeth and causing a significant amount of pain. Pt states she has been having swollen lymph nodes causing a lot of pain and swallowing difficulty she believes.  Pt states she is scared any meat or peeling will get stuck so she avoids these foods also stating there has never been an issue but that fear is still there.   Estimated daily fluid intake:  oz Supplements:  Sleep: pt states terrible  Stress / self-care: high Current average weekly physical activity: ADL's due to fatigue   24-Hr Dietary Recall First Meal: shake Snack:  Second Meal: shake or vanilla pudding Snack:  Third Meal: green beans mashed potatoes Snack:  Beverages:    NUTRITION DIAGNOSIS  [place]-1.2 Biting/Chewing (masticatory) difficulty As related to poor dentition.  As evidenced by limited food acceptance unless pureed.   NUTRITION INTERVENTION  Nutrition education (E-1) on the following topics:  Educated pt on proper preparation of foods for poor dentition  Educated pt on necessity of eating often throughout the day to reduce continued wasting and probably micronutrient deficiencies   Handouts Provided Include  Step instructions paired with recipes for pureed foods  Videos of how to prepare pureed foods  Learning Style & Readiness for Change Teaching method utilized: Visual & Auditory  Demonstrated degree of understanding via: Teach Back  Barriers to learning/adherence to lifestyle change: poor dentition    Goals Established by Pt I will eat the meals my husband purees and tell him if the food is not the consistency I need   MONITORING & EVALUATION Dietary intake, weekly physical activity  Next Steps  Patient is to call or email with any further issues or weight loss.

## 2024-05-04 NOTE — Discharge Summary (Signed)
 Hospital Medicine Discharge Summary   Demographics: Monica Hopkins  53 y.o. 01/21/1971 MRN: [id number]    Extended Emergency Contact Information Primary Emergency Contact: [facility] Phone: [phone] Relation: Spouse  Full Code  Telemedicine encounter. Patient identity confirmed. Consent for telemedicine service. Utilize live two-way audio and video communication for 8 minutes today. My location: [place] . [place] medical license.   Admit Date: 04/27/2024                            Attending Physician: No att. providers found Discharge Date: 04/29/2024. Primary Care Provider: No Pcp   None  Consults during this admission: Consult Orders     None       Active & Resolved Diagnosis: Principal Problem:   UTI (urinary tract infection) Active Problems:   Polypharmacy   Acute metabolic encephalopathy   Orthostatic hypotension   Hypomagnesemia   Pancreatic lesion (CMD) Resolved Problems:   * No resolved hospital problems. *   Disposition: Patient discharged to Home in stable condition.   Discharge follow-up recommendations : See [facility] Course: Monica Hopkins is a 53 year old lady with a history of bipolar disorder, chronic pain syndrome, orthostatic hypotension and cardiac arrest who presented with feeling foggy headed.  She been having some symptoms for about a week and a half prior to presentation.  She has been into an urgent care about 2 days prior to presentation and was diagnosed with UTI and started on Bactrim .  Patient continued to have back pain chills and feeling feverish.  Additionally she was noted to have some confusion into not quite to be her usual self.  Other lab findings were remarkable for mild hypomagnesemia.  Patient had her electrolytes corrected and was started on ceftriaxone .  CT scan of the abdomen pelvis showed constipation and an incidental 1.2 cm density in the pancreas and a 1.6 cm hypodensity in the right  adrenal gland.  MRI was recommended by radiology for follow-up as an outpatient.   Patient's mental status returned to normal fairly quickly.  Her urine cultures were negative but she had been on antibiotics prior to arrival.  The 27th she was felt ready for discharge home in improved condition.  Wound / Incision Assessment: Refer to Chart Review and Media Tab for images if available.       Discharge Medications     Modified Medications      Sig Disp Refill Start End  clonazePAM  0.5 mg tablet Commonly known as: KlonoPIN  What changed: See the new instructions.  TAKE 1 TABLET BY MOUTH EVERY 6-12 HOURS AS NEEDED. **MAX 2 TABLETS IN 24 HOURS**   0     traZODone  150 mg tablet Commonly known as: DESYREL  What changed: See the new instructions.  1 TAB ORALLY EVERY 24 HOURS.MAY TAKE UP TO 400MG  (2& 2/3 TABS)MAX IN 24 HOURS FOR DEPRESSION & SLEEP   0         Medications To Continue      Sig Disp Refill Start End  acetaminophen  325 mg tablet Commonly known as: TYLENOL   Take 650 mg by mouth every 6 (six) hours as needed.   0     divalproex  250 mg 12 hr tablet Commonly known as: DEPAKOTE  DR  Take 250 mg by mouth 2 (two) times a day.   0     gabapentin  100 mg capsule Commonly known as: NEURONTIN   Take 100 mg by mouth 3 (  three) times a day.   0     ibuprofen  200 mg tablet Commonly known as: MOTRIN   Take 200 mg by mouth every 6 (six) hours as needed for mild pain (1-3).   0     midodrine 10 mg tablet Commonly known as: PROAMATINE  Take 10 mg by mouth 3 (three) times a day.   0     nitrofurantoin (macrocrystal-monohydrate) 100 mg capsule Commonly known as: MACROBID  Take 1 capsule by mouth in the morning and 1 capsule in the evening.   0     oxyCODONE  10 mg Tab Commonly known as: ROXICODONE   Take 1 tablet (10 mg total) by mouth every 6 (six) hours for 5 days.  12 tablet  0     promethazine 25 mg tablet Commonly known as: PHENERGAN  TAKE 1 TABLET BY MOUTH EVERY 4  HOURS AS NEEDED FOR NAUSEA   0         Stopped Medications    diphenhydrAMINE  25 mg capsule Commonly known as: BENADRYL    loratadine  10 mg tablet Commonly known as: CLARITIN        Unreviewed Medications      Sig Disp Refill Start End  cefdinir 300 mg capsule Commonly known as: OMNICEF Ask about: Should I take this medication?  Take 1 capsule (300 mg total) by mouth 2 (two) times a day for 3 days.  6 capsule  0         Discharge Orders     Ambulatory referral to PCP     Full Code     Lifting Limits:     Details:    Lifting Limits: No lifting limits         Lab Results  Component Value Date/Time   HGB 12.3 04/30/2024 05:46 AM   HCT 36.7 04/30/2024 05:46 AM   WBC 4.90 04/30/2024 05:46 AM   PLT 224 04/30/2024 05:46 AM   Lab Results  Component Value Date/Time   NA 142 04/30/2024 05:46 AM   K 3.8 04/30/2024 05:46 AM   CREATININE 0.70 04/30/2024 05:46 AM   BUN 11 04/30/2024 05:46 AM   GLUCOSE 87 04/30/2024 05:46 AM    Pertinent Imaging: No orders to display    Electronically signed by: Koren JONELLE Blacker, MD 05/04/2024 2:19 PM   Time spent on discharge: 27 minutes

## 2024-06-01 ENCOUNTER — Emergency Department (HOSPITAL_COMMUNITY)

## 2024-06-01 ENCOUNTER — Other Ambulatory Visit: Payer: Self-pay

## 2024-06-01 ENCOUNTER — Encounter (HOSPITAL_COMMUNITY): Payer: Self-pay | Admitting: Pharmacy Technician

## 2024-06-01 ENCOUNTER — Inpatient Hospital Stay (HOSPITAL_COMMUNITY)
Admission: EM | Admit: 2024-06-01 | Discharge: 2024-06-03 | DRG: 689 | Disposition: A | Discharge status: Home / Self Care | Attending: Internal Medicine | Admitting: Internal Medicine

## 2024-06-01 DIAGNOSIS — Z79899 Other long term (current) drug therapy: Secondary | ICD-10-CM

## 2024-06-01 DIAGNOSIS — E860 Dehydration: Secondary | ICD-10-CM | POA: Diagnosis present

## 2024-06-01 DIAGNOSIS — Z888 Allergy status to other drugs, medicaments and biological substances status: Secondary | ICD-10-CM

## 2024-06-01 DIAGNOSIS — N390 Urinary tract infection, site not specified: Secondary | ICD-10-CM | POA: Diagnosis present

## 2024-06-01 DIAGNOSIS — G9341 Metabolic encephalopathy: Secondary | ICD-10-CM | POA: Diagnosis present

## 2024-06-01 DIAGNOSIS — N10 Acute tubulo-interstitial nephritis: Principal | ICD-10-CM | POA: Diagnosis present

## 2024-06-01 DIAGNOSIS — F319 Bipolar disorder, unspecified: Secondary | ICD-10-CM | POA: Diagnosis present

## 2024-06-01 DIAGNOSIS — R410 Disorientation, unspecified: Secondary | ICD-10-CM

## 2024-06-01 DIAGNOSIS — I959 Hypotension, unspecified: Secondary | ICD-10-CM | POA: Diagnosis present

## 2024-06-01 DIAGNOSIS — N200 Calculus of kidney: Secondary | ICD-10-CM | POA: Diagnosis present

## 2024-06-01 DIAGNOSIS — R300 Dysuria: Principal | ICD-10-CM

## 2024-06-01 DIAGNOSIS — F17210 Nicotine dependence, cigarettes, uncomplicated: Secondary | ICD-10-CM | POA: Diagnosis present

## 2024-06-01 LAB — CBC WITH DIFFERENTIAL/PLATELET
Abs Immature Granulocytes: 0.01 K/uL (ref 0.00–0.07)
Basophils Absolute: 0 K/uL (ref 0.0–0.1)
Basophils Relative: 0 %
Eosinophils Absolute: 0.2 K/uL (ref 0.0–0.5)
Eosinophils Relative: 2 %
HCT: 40.4 % (ref 36.0–46.0)
Hemoglobin: 12.7 g/dL (ref 12.0–15.0)
Immature Granulocytes: 0 %
Lymphocytes Relative: 44 %
Lymphs Abs: 3 K/uL (ref 0.7–4.0)
MCH: 29.1 pg (ref 26.0–34.0)
MCHC: 31.4 g/dL (ref 30.0–36.0)
MCV: 92.4 fL (ref 80.0–100.0)
Monocytes Absolute: 0.4 K/uL (ref 0.1–1.0)
Monocytes Relative: 6 %
Neutro Abs: 3.2 K/uL (ref 1.7–7.7)
Neutrophils Relative %: 48 %
Platelets: 210 K/uL (ref 150–400)
RBC: 4.37 MIL/uL (ref 3.87–5.11)
RDW: 12.9 % (ref 11.5–15.5)
WBC: 6.9 K/uL (ref 4.0–10.5)
nRBC: 0 % (ref 0.0–0.2)

## 2024-06-01 LAB — URINALYSIS, W/ REFLEX TO CULTURE (INFECTION SUSPECTED)
Bacteria, UA: NONE SEEN
Bilirubin Urine: NEGATIVE
Glucose, UA: NEGATIVE mg/dL
Hgb urine dipstick: NEGATIVE
Ketones, ur: NEGATIVE mg/dL
Nitrite: NEGATIVE
Protein, ur: NEGATIVE mg/dL
Specific Gravity, Urine: 1.031 — ABNORMAL HIGH (ref 1.005–1.030)
pH: 7 (ref 5.0–8.0)

## 2024-06-01 LAB — COMPREHENSIVE METABOLIC PANEL WITH GFR
ALT: 8 U/L (ref 0–44)
AST: 16 U/L (ref 15–41)
Albumin: 3.6 g/dL (ref 3.5–5.0)
Alkaline Phosphatase: 53 U/L (ref 38–126)
Anion gap: 10 (ref 5–15)
BUN: 10 mg/dL (ref 6–20)
CO2: 32 mmol/L (ref 22–32)
Calcium: 9.2 mg/dL (ref 8.9–10.3)
Chloride: 96 mmol/L — ABNORMAL LOW (ref 98–111)
Creatinine, Ser: 0.91 mg/dL (ref 0.44–1.00)
GFR, Estimated: >60 mL/min (ref >60)
Glucose, Bld: 101 mg/dL — ABNORMAL HIGH (ref 70–99)
Potassium: 4.4 mmol/L (ref 3.5–5.1)
Sodium: 138 mmol/L (ref 135–145)
Total Bilirubin: 0.4 mg/dL (ref 0.0–1.2)
Total Protein: 6.2 g/dL — ABNORMAL LOW (ref 6.5–8.1)

## 2024-06-01 LAB — I-STAT CG4 LACTIC ACID, ED
Lactic Acid, Venous: 0.4 mmol/L — ABNORMAL LOW (ref 0.5–1.9)
Lactic Acid, Venous: 0.7 mmol/L (ref 0.5–1.9)

## 2024-06-01 LAB — CBG MONITORING, ED: Glucose-Capillary: 99 mg/dL (ref 70–99)

## 2024-06-01 LAB — ETHANOL: Alcohol, Ethyl (B): <15 mg/dL (ref <15)

## 2024-06-01 MED ORDER — SODIUM CHLORIDE 0.9 % IV BOLUS
1000.0000 mL | Freq: Once | INTRAVENOUS | Status: AC
  Administered 2024-06-01: 1000 mL via INTRAVENOUS

## 2024-06-01 MED ORDER — IOHEXOL 350 MG/ML SOLN
75.0000 mL | Freq: Once | INTRAVENOUS | Status: AC | PRN
Start: 2024-06-01 — End: 2024-06-01
  Administered 2024-06-01: 75 mL via INTRAVENOUS

## 2024-06-01 MED ORDER — LORAZEPAM 1 MG PO TABS
0.5000 mg | ORAL_TABLET | Freq: Once | ORAL | Status: AC
Start: 2024-06-01 — End: 2024-06-01
  Administered 2024-06-01: 0.5 mg via ORAL
  Filled 2024-06-01: qty 1

## 2024-06-01 NOTE — ED Triage Notes (Signed)
 Pt family reports pt has been forgetful for a while. Also reports yesterday at PCP they noticed R facial droop. Family member unable to tell me how long that has been going on.

## 2024-06-01 NOTE — ED Provider Triage Note (Signed)
 Emergency Medicine Provider Triage Evaluation Note  Alie Moudy , a 53 y.o. female  was evaluated in triage.  Pt complains of dysuria, and facial drooping for several days.  Review of Systems  Positive: Dysuria, facial drooping weakness Negative: Chest pain, shortness of breath, dizziness  Physical Exam  BP 94/63 (BP Location: Left Arm)   Pulse 80   Temp 98.2 F (36.8 C)   Resp 17   SpO2 93%  Gen:   Awake, no distress   Resp:  Normal effort  MSK:   Moves extremities without difficulty  Other:    Medical Decision Making  Medically screening exam initiated at 4:20 PM.  Appropriate orders placed.  Kashauna Celmer was informed that the remainder of the evaluation will be completed by another provider, this initial triage assessment does not replace that evaluation, and the importance of remaining in the ED until their evaluation is complete.  Patient was recently admitted to the hospital in [facility] for UTI.  Patient reports she does not feel like she ever fully recovered.  She was seen at PCP yesterday and they had concern for facial drooping.  She was advised to go to the ED but never went.  Family advises facial drooping could have been going on for weeks to months.  There is no focal deficits on exam.   Myriam Fonda RAMAN, PA-C 06/01/24 1622

## 2024-06-01 NOTE — ED Notes (Signed)
 Ambulatory to bathroom with assistance from her husband

## 2024-06-01 NOTE — ED Notes (Signed)
 Pt received medication for anxiety pertaining to MRI scan. Pt left for scan

## 2024-06-01 NOTE — ED Triage Notes (Signed)
 QUICK TRIAGE: Pt to ER who states she had a UTI approximately 1 month ago and he does not believe it has resolved.  Also reports low BP, was seen by PCP yesterday and told to come to ER for further evaluation.

## 2024-06-01 NOTE — ED Notes (Signed)
 EKG COMPLETED IN TRIAGE WITH PATIENT DID NOT CROSS OVER

## 2024-06-01 NOTE — H&P (Signed)
 History and Physical    Patient: Monica Hopkins FMW:[id number] DOB: 13-Jan-1971 DOA: 06/01/2024 DOS: the patient was seen and examined on 06/01/2024 PCP: Loring Tanda Mae, MD  Patient coming from: {Point_of_Origin:[address]}  Chief Complaint: Altered mental status Chief Complaint  Patient presents with   Hypotension   Dysuria   HPI: Monica Hopkins is a 53 y.o. female with medical history significant of ***  Review of Systems: {ROS_Text:26778} Past Medical History:  Diagnosis Date   Cardiac arrest (HCC)    Prolapse, disk    Past Surgical History:  Procedure Laterality Date   NECK SURGERY     Social History:  reports that she has been smoking cigarettes. She has a 9 pack-year smoking history. She has never used smokeless tobacco. She reports that she does not drink alcohol and does not use drugs.  Allergies  Allergen Reactions   Abilify [Aripiprazole] Swelling and Rash   Lamictal [Lamotrigine] Swelling and Rash   Latuda  [Lurasidone ] Other (See Comments)    Caused leg weakness   Seroquel  [Quetiapine ] Other (See Comments)    Insomnia, restlessness    Family History  Problem Relation Age of Onset   Mental illness Mother    Mental illness Sister     Prior to Admission medications   Medication Sig Start Date End Date Taking? Authorizing Provider  albuterol  (PROVENTIL  HFA) 108 (90 Base) MCG/ACT inhaler Inhale 1-2 puffs into the lungs as needed for wheezing or shortness of breath.    [provider]  amoxicillin -clavulanate (AUGMENTIN ) 875-125 MG tablet Take 1 tablet by mouth every 12 (twelve) hours. 09/25/23   Jerrol Agent, MD  clonazePAM  (KLONOPIN ) 0.5 MG tablet Take one tab twice a daily and 3rd needed for severe anxiety Patient taking differently: Take 0.5 mg by mouth 2 (two) times daily as needed for anxiety. 03/10/22   Arfeen, Leni DASEN, MD  divalproex  (DEPAKOTE ) 250 MG DR tablet Take 250 mg by mouth 2 (two) times daily. 06/08/23   [provider]  fentaNYL   (DURAGESIC ) 12 MCG/HR Place 1 patch onto the skin every 3 (three) days. 07/26/23   [provider]  gabapentin  (NEURONTIN ) 300 MG capsule Take 1 capsule (300 mg total) by mouth 2 (two) times daily. 06/25/22   Danton Reyes DASEN, MD  lidocaine  (XYLOCAINE ) 2 % solution Use as directed 15 mLs in the mouth or throat every 4 (four) hours as needed for mouth pain (swish and spit). Patient not taking: Reported on 09/21/2023 01/17/23   Kommor, Lum, MD  midodrine (PROAMATINE) 10 MG tablet Take 10 mg by mouth every 8 (eight) hours.    [provider]  Oxycodone  HCl 20 MG TABS Take 20 mg by mouth 4 (four) times daily. 07/23/23   [provider]  polyethylene glycol (MIRALAX  / GLYCOLAX ) 17 g packet Take 17 g by mouth daily as needed for moderate constipation. 06/25/22   Danton Reyes DASEN, MD  senna-docusate (SENOKOT-S) 8.6-50 MG tablet Take 1 tablet by mouth at bedtime. 06/25/22   Danton Reyes DASEN, MD  traZODone  (DESYREL ) 100 MG tablet Take 100 mg by mouth at bedtime.    [provider]  valproic  acid (DEPAKENE ) 250 MG capsule Take 2 capsules (500 mg total) by mouth 2 (two) times daily. 06/25/22   Danton Reyes DASEN, MD    Physical Exam: Vitals:   06/01/24 2118 06/01/24 2130 06/01/24 2133 06/01/24 2200  BP: 104/68 102/68  (!) 100/56  Pulse: 60 (!) 55  (!) 50  Resp: 20 18  15   Temp:  97.7 F (36.5 C)   TempSrc:      SpO2: 96% 96%  97%   *** Data Reviewed: {Tip this will not be part of the note when signed- Document your independent interpretation of telemetry tracing, EKG, lab, Radiology test or any other diagnostic tests. Add any new diagnostic test ordered today. (Optional):26781} {Results:26384}  Assessment and Plan: No notes have been filed under this hospital service. Service: Hospitalist     Advance Care Planning:   Code Status: Prior ***  Consults: ***  Family Communication: ***  Severity of  Illness: {Observation/Inpatient:21159}  Author: Drue ONEIDA Potter, MD 06/01/2024 11:26 PM  For on call review [email or web address].

## 2024-06-01 NOTE — ED Notes (Signed)
 Pt's husband states that he believes she is having a UTI do to increase thirst, decrease in appetite and not acting per her normal. He states she appears to be more confused and is acting how she does when she is having a UTI.

## 2024-06-01 NOTE — ED Provider Notes (Signed)
 [place] EMERGENCY DEPARTMENT AT [facility] Provider Note   CSN: [id number] Arrival date & time: 06/01/24  1510     Patient presents with: Hypotension and Dysuria   Monica Hopkins is a 53 y.o. female.   HPI   53 year old female presents emergency department accompanied by significant other with concern for worsening confusion/change in mental status as well as possible right-sided facial droop.  Patient is a poor historian, confused.  Significant other at bedside states that about a month ago she had similar symptoms, was admitted for encephalopathy secondary to UTI.  Treated with IV antibiotics, transition to oral. She was seen at the primary doctor's office yesterday and there was concern for new right sided facial droop in the office.  He felt like yesterday she did have a small right sided facial droop at the corner of the mouth but feels like her face is back to normal today.  No other noted focal deficit or loss of speech/vision.  Patient is complaining of some mild abdominal pain.  They both do admit to ongoing nonbloody diarrhea.  No documented fever/chills.  Is known to have low blood pressure, supposed to be taking midodrine, significant other speculates that she has not been taking her medication due to confusion.  Prior to Admission medications   Medication Sig Start Date End Date Taking? Authorizing Provider  albuterol  (PROVENTIL  HFA) 108 (90 Base) MCG/ACT inhaler Inhale 1-2 puffs into the lungs as needed for wheezing or shortness of breath.    [provider]  amoxicillin -clavulanate (AUGMENTIN ) 875-125 MG tablet Take 1 tablet by mouth every 12 (twelve) hours. 09/25/23   Jerrol Agent, MD  clonazePAM  (KLONOPIN ) 0.5 MG tablet Take one tab twice a daily and 3rd needed for severe anxiety Patient taking differently: Take 0.5 mg by mouth 2 (two) times daily as needed for anxiety. 03/10/22   Arfeen, Leni DASEN, MD  divalproex  (DEPAKOTE ) 250 MG DR tablet Take 250 mg by  mouth 2 (two) times daily. 06/08/23   [provider]  fentaNYL  (DURAGESIC ) 12 MCG/HR Place 1 patch onto the skin every 3 (three) days. 07/26/23   [provider]  gabapentin  (NEURONTIN ) 300 MG capsule Take 1 capsule (300 mg total) by mouth 2 (two) times daily. 06/25/22   Danton Reyes DASEN, MD  lidocaine  (XYLOCAINE ) 2 % solution Use as directed 15 mLs in the mouth or throat every 4 (four) hours as needed for mouth pain (swish and spit). Patient not taking: Reported on 09/21/2023 01/17/23   Kommor, Lum, MD  midodrine (PROAMATINE) 10 MG tablet Take 10 mg by mouth every 8 (eight) hours.    [provider]  Oxycodone  HCl 20 MG TABS Take 20 mg by mouth 4 (four) times daily. 07/23/23   [provider]  polyethylene glycol (MIRALAX  / GLYCOLAX ) 17 g packet Take 17 g by mouth daily as needed for moderate constipation. 06/25/22   Danton Reyes DASEN, MD  senna-docusate (SENOKOT-S) 8.6-50 MG tablet Take 1 tablet by mouth at bedtime. 06/25/22   Danton Reyes DASEN, MD  traZODone  (DESYREL ) 100 MG tablet Take 100 mg by mouth at bedtime.    [provider]  valproic  acid (DEPAKENE ) 250 MG capsule Take 2 capsules (500 mg total) by mouth 2 (two) times daily. 06/25/22   Danton Reyes DASEN, MD    Allergies: Abilify [aripiprazole], Lamictal [lamotrigine], Latuda  [lurasidone ], and Seroquel  [quetiapine ]    Review of Systems  Unable to perform ROS: Mental status change    Updated Vital Signs  BP 92/72   Pulse 60   Temp 98.2 F (36.8 C)   Resp 12   SpO2 99%   Physical Exam Vitals and nursing note reviewed.  Constitutional:      Appearance: Normal appearance. She is ill-appearing.  HENT:     Head: Normocephalic.     Mouth/Throat:     Mouth: Mucous membranes are moist.  Eyes:     Pupils: Pupils are equal, round, and reactive to light.  Cardiovascular:     Rate and Rhythm: Normal rate.  Pulmonary:     Effort: Pulmonary effort is normal. No respiratory  distress.  Abdominal:     Palpations: Abdomen is soft.     Tenderness: There is no abdominal tenderness. There is no guarding.  Musculoskeletal:        General: No deformity.  Skin:    General: Skin is warm.  Neurological:     General: No focal deficit present.     Mental Status: She is alert. She is disoriented.     (all labs ordered are listed, but only abnormal results are displayed) Labs Reviewed  COMPREHENSIVE METABOLIC PANEL WITH GFR - Abnormal; Notable for the following components:      Result Value   Chloride 96 (*)    Glucose, Bld 101 (*)    Total Protein 6.2 (*)    All other components within normal limits  I-STAT CG4 LACTIC ACID, ED - Abnormal; Notable for the following components:   Lactic Acid, Venous 0.4 (*)    All other components within normal limits  CBC WITH DIFFERENTIAL/PLATELET  ETHANOL  URINALYSIS, W/ REFLEX TO CULTURE (INFECTION SUSPECTED)  CBG MONITORING, ED  I-STAT CG4 LACTIC ACID, ED    EKG: EKG Interpretation Date/Time:  Thursday June 01 2024 17:50:48 EDT Ventricular Rate:  57 PR Interval:  162 QRS Duration:  106 QT Interval:  422 QTC Calculation: 411 R Axis:   35  Text Interpretation: Sinus rhythm Low voltage, extremity and precordial leads ST elev, probable normal early repol pattern Confirmed by Bari Flank [phone]) on 06/01/2024 6:43:14 PM  Radiology: CT ABDOMEN PELVIS W CONTRAST Result Date: 06/01/2024 EXAM: CT ABDOMEN AND PELVIS WITH CONTRAST 06/01/2024 06:37:35 PM TECHNIQUE: CT of the abdomen and pelvis was performed with the administration of 75 mL of iohexol  (OMNIPAQUE ) 350 MG/ML injection. Multiplanar reformatted images are provided for review. Automated exposure control, iterative reconstruction, and/or weight-based adjustment of the mA/kV was utilized to reduce the radiation dose to as low as reasonably achievable. COMPARISON: CT 06/10/2022, 12/05/2019 CLINICAL HISTORY: UTI, recurrent/complicated (Female) FINDINGS: LOWER CHEST:  Lung bases demonstrate mild atelectasis or scarring at the left base. LIVER: No focal hepatic abnormality. GALLBLADDER AND BILE DUCTS: Cholecystectomy. Mild intra- and extrahepatic biliary dilatation, which is chronic and likely due to postsurgical change. SPLEEN: No acute abnormality. PANCREAS: Atrophic pancreas without inflammation. ADRENAL GLANDS: Stable small bilateral adrenal gland nodules measuring up to 16 mm on the right, likely representing small adenomas, no specific imaging follow-up is recommended. KIDNEYS, URETERS AND BLADDER: Punctate nonobstructing left kidney stones. Mild cortical scarring of the left kidney. Slightly heterogeneous hypoenhancement of left kidney cortex on delayed views. No stones in the right kidney or ureters. No hydronephrosis. No perinephric or periureteral stranding. Urinary bladder is unremarkable. GI AND BOWEL: Stomach demonstrates no acute abnormality. There is no bowel obstruction. PERITONEUM AND RETROPERITONEUM: No ascites. No free air. VASCULATURE: Aorta is normal in caliber. LYMPH NODES: No lymphadenopathy. REPRODUCTIVE ORGANS: Hysterectomy. No suspicious adnexal mass. BONES AND SOFT TISSUES:  Degenerative changes of the spine. No acute osseous abnormality. No focal soft tissue abnormality. IMPRESSION: 1. Cortical scarring of the left kidney. Slightly heterogeneous cortical hypoenhancement of the left kidney on delayed views, correlate for upper uti/pyelonephritis. 2. Punctate nonobstructing left nephrolithiasis. Electronically signed by: Luke Bun MD 06/01/2024 06:54 PM EDT RP Workstation: [id number]   CT HEAD WO CONTRAST Result Date: 06/01/2024 CLINICAL DATA:  Provided history: Neuro deficit, acute, stroke suspected EXAM: CT HEAD WITHOUT CONTRAST TECHNIQUE: Contiguous axial images were obtained from the base of the skull through the vertex without intravenous contrast. RADIATION DOSE REDUCTION: This exam was performed according to the departmental dose-optimization  program which includes automated exposure control, adjustment of the mA and/or kV according to patient size and/or use of iterative reconstruction technique. COMPARISON:  Head CT 07/11/2023 FINDINGS: Brain: No intracranial hemorrhage, mass effect, or midline shift. No hydrocephalus. The basilar cisterns are patent. Mild symmetric basal gangliar mineralization is unchanged from prior. No evidence of territorial infarct or acute ischemia. No extra-axial or intracranial fluid collection. Vascular: No hyperdense vessel or unexpected calcification. Skull: No fracture or focal lesion. Sinuses/Orbits: Partial opacification of right mastoid air cells. Included paranasal sinuses are clear. Other: None. IMPRESSION: 1. No acute intracranial abnormality. 2. Partial opacification of right mastoid air cells. Electronically Signed   By: Andrea Gasman M.D.   On: 06/01/2024 16:21     Procedures   Medications Ordered in the ED  LORazepam (ATIVAN) tablet 0.5 mg (has no administration in time range)  sodium chloride  0.9 % bolus 1,000 mL (1,000 mLs Intravenous New Bag/Given 06/01/24 1749)  iohexol  (OMNIPAQUE ) 350 MG/ML injection 75 mL (75 mLs Intravenous Contrast Given 06/01/24 1838)                                    Medical Decision Making Amount and/or Complexity of Data Reviewed Labs: ordered. Radiology: ordered.  Risk Prescription drug management. Decision regarding hospitalization.   53 year old female presents emergency department with her husband for concern of confusion and right-sided facial droop. Otherwise the husband has not noticed any focal symptoms.  Patient is confused and tearful at times.  Vitals are normal and stable.  She is nonfocal on neuroexam.  Blood work is reassuring, no leukocytosis.  Lactic and alcohol are negative.  CT head is unremarkable.  CT of the abdomen pelvis questions left kidney findings, evaluate for upper UTI/pyelonephritis.  Urinalysis is not significant for UTI.   Question if this could be affected by recent outpatient oral antibiotics.  Patient continues to be confused, husband states that this is very changed from her baseline, not safe for discharge.  Will plan for medical admission will pursue MRI of the brain as well to rule out any acute neurologic change given reported right sided facial droop yesterday.  Husband does not note the facial droop now and feels like outside of the confusion that she is neurologically at baseline.  Patients evaluation and results requires admission for further treatment and care.  Spoke with hospitalist, reviewed patient's ED course and they accept admission.  Patient agrees with admission plan, offers no new complaints and is stable/unchanged at time of admit.     Final diagnoses:  None    ED Discharge Orders     None          Bari Roxie HERO, DO 06/01/24 2243

## 2024-06-02 DIAGNOSIS — E860 Dehydration: Secondary | ICD-10-CM | POA: Diagnosis present

## 2024-06-02 DIAGNOSIS — F17210 Nicotine dependence, cigarettes, uncomplicated: Secondary | ICD-10-CM | POA: Diagnosis present

## 2024-06-02 DIAGNOSIS — R300 Dysuria: Secondary | ICD-10-CM | POA: Diagnosis present

## 2024-06-02 DIAGNOSIS — N390 Urinary tract infection, site not specified: Secondary | ICD-10-CM | POA: Diagnosis present

## 2024-06-02 DIAGNOSIS — N10 Acute tubulo-interstitial nephritis: Secondary | ICD-10-CM | POA: Diagnosis present

## 2024-06-02 DIAGNOSIS — G9341 Metabolic encephalopathy: Secondary | ICD-10-CM | POA: Diagnosis present

## 2024-06-02 DIAGNOSIS — Z888 Allergy status to other drugs, medicaments and biological substances status: Secondary | ICD-10-CM | POA: Diagnosis not present

## 2024-06-02 DIAGNOSIS — Z79899 Other long term (current) drug therapy: Secondary | ICD-10-CM | POA: Diagnosis not present

## 2024-06-02 DIAGNOSIS — I959 Hypotension, unspecified: Secondary | ICD-10-CM | POA: Diagnosis present

## 2024-06-02 DIAGNOSIS — N200 Calculus of kidney: Secondary | ICD-10-CM | POA: Diagnosis present

## 2024-06-02 DIAGNOSIS — F319 Bipolar disorder, unspecified: Secondary | ICD-10-CM | POA: Diagnosis present

## 2024-06-02 LAB — CBC WITH DIFFERENTIAL/PLATELET
Abs Immature Granulocytes: 0.01 K/uL (ref 0.00–0.07)
Basophils Absolute: 0 K/uL (ref 0.0–0.1)
Basophils Relative: 1 %
Eosinophils Absolute: 0.2 K/uL (ref 0.0–0.5)
Eosinophils Relative: 3 %
HCT: 35.4 % — ABNORMAL LOW (ref 36.0–46.0)
Hemoglobin: 11.3 g/dL — ABNORMAL LOW (ref 12.0–15.0)
Immature Granulocytes: 0 %
Lymphocytes Relative: 46 %
Lymphs Abs: 2.5 K/uL (ref 0.7–4.0)
MCH: 29.5 pg (ref 26.0–34.0)
MCHC: 31.9 g/dL (ref 30.0–36.0)
MCV: 92.4 fL (ref 80.0–100.0)
Monocytes Absolute: 0.4 K/uL (ref 0.1–1.0)
Monocytes Relative: 7 %
Neutro Abs: 2.3 K/uL (ref 1.7–7.7)
Neutrophils Relative %: 43 %
Platelets: 176 K/uL (ref 150–400)
RBC: 3.83 MIL/uL — ABNORMAL LOW (ref 3.87–5.11)
RDW: 13 % (ref 11.5–15.5)
WBC: 5.5 K/uL (ref 4.0–10.5)
nRBC: 0 % (ref 0.0–0.2)

## 2024-06-02 LAB — BASIC METABOLIC PANEL WITH GFR
Anion gap: 11 (ref 5–15)
BUN: 7 mg/dL (ref 6–20)
CO2: 28 mmol/L (ref 22–32)
Calcium: 9.1 mg/dL (ref 8.9–10.3)
Chloride: 101 mmol/L (ref 98–111)
Creatinine, Ser: 0.73 mg/dL (ref 0.44–1.00)
GFR, Estimated: >60 mL/min (ref >60)
Glucose, Bld: 88 mg/dL (ref 70–99)
Potassium: 3.8 mmol/L (ref 3.5–5.1)
Sodium: 140 mmol/L (ref 135–145)

## 2024-06-02 LAB — HIV ANTIBODY (ROUTINE TESTING W REFLEX): HIV Screen 4th Generation wRfx: NONREACTIVE

## 2024-06-02 LAB — PROCALCITONIN: Procalcitonin: <0.1 ng/mL

## 2024-06-02 MED ORDER — SODIUM CHLORIDE 0.9 % IV SOLN: INTRAVENOUS | Status: AC

## 2024-06-02 MED ORDER — POLYETHYLENE GLYCOL 3350 17 G PO PACK: 17.0000 g | PACK | Freq: Every day | ORAL | Status: DC | PRN

## 2024-06-02 MED ORDER — SODIUM CHLORIDE 0.9 % IV SOLN
1.0000 g | INTRAVENOUS | Status: DC
  Administered 2024-06-02 – 2024-06-03 (×2): 1 g via INTRAVENOUS
  Filled 2024-06-02 (×2): qty 10

## 2024-06-02 MED ORDER — ACETAMINOPHEN 500 MG PO TABS
500.0000 mg | ORAL_TABLET | Freq: Four times a day (QID) | ORAL | Status: DC | PRN
  Administered 2024-06-02: 500 mg via ORAL
  Filled 2024-06-02: qty 1

## 2024-06-02 MED ORDER — DIVALPROEX SODIUM 250 MG PO DR TAB
250.0000 mg | DELAYED_RELEASE_TABLET | Freq: Two times a day (BID) | ORAL | Status: DC
  Administered 2024-06-02 – 2024-06-03 (×4): 250 mg via ORAL
  Filled 2024-06-02 (×5): qty 1

## 2024-06-02 MED ORDER — TRAZODONE HCL 50 MG PO TABS: 150.0000 mg | ORAL_TABLET | Freq: Every day | ORAL | Status: DC

## 2024-06-02 MED ORDER — CLONAZEPAM 0.5 MG PO TABS
0.5000 mg | ORAL_TABLET | Freq: Two times a day (BID) | ORAL | Status: DC
  Administered 2024-06-02 – 2024-06-03 (×3): 0.5 mg via ORAL
  Filled 2024-06-02 (×3): qty 1

## 2024-06-02 MED ORDER — MIDODRINE HCL 5 MG PO TABS
10.0000 mg | ORAL_TABLET | Freq: Three times a day (TID) | ORAL | Status: DC
  Administered 2024-06-02 (×3): 10 mg via ORAL
  Filled 2024-06-02 (×3): qty 2

## 2024-06-02 MED ORDER — PROCHLORPERAZINE EDISYLATE 10 MG/2ML IJ SOLN
5.0000 mg | Freq: Four times a day (QID) | INTRAMUSCULAR | Status: DC | PRN
  Administered 2024-06-02: 5 mg via INTRAVENOUS
  Filled 2024-06-02: qty 2

## 2024-06-02 MED ORDER — OXYCODONE HCL 5 MG PO TABS
5.0000 mg | ORAL_TABLET | Freq: Four times a day (QID) | ORAL | Status: DC | PRN
  Administered 2024-06-02 (×2): 5 mg via ORAL
  Filled 2024-06-02 (×2): qty 1

## 2024-06-02 MED ORDER — GABAPENTIN 100 MG PO CAPS
100.0000 mg | ORAL_CAPSULE | Freq: Three times a day (TID) | ORAL | Status: DC
  Administered 2024-06-02 (×3): 100 mg via ORAL
  Filled 2024-06-02 (×3): qty 1

## 2024-06-02 MED ORDER — ENOXAPARIN SODIUM 40 MG/0.4ML IJ SOSY
40.0000 mg | PREFILLED_SYRINGE | Freq: Every day | INTRAMUSCULAR | Status: DC
  Administered 2024-06-02 – 2024-06-03 (×2): 40 mg via SUBCUTANEOUS
  Filled 2024-06-02 (×2): qty 0.4

## 2024-06-02 NOTE — Evaluation (Signed)
 Physical Therapy Evaluation Patient Details Name: Monica Hopkins MRN: [id number] DOB: 1971-01-02 Today's Date: 06/02/2024  History of Present Illness  53 y.o. female presents to [facility] 06/01/24 with dysuria, AMS, and R facial droop. Pt with acute metabolic encephalopathy and possible upper UTI/pyelonephritis on L. Also with punctate L nephrolithiasis. PMHx: orthostatic hypotension, bipolar disorder   Clinical Impression  PTA pt was independent for mobility with no AD. Pt reports having generalized chronic pain and neuropathy/nerve damage that makes it difficult to use L hand. Pt was able to stand with MinA via 1HH from husband for slight boost-up. Able to then ambulate 4107ft with CGA and no AD. Pt was occasionally unsteady, however, able to correct with lateral side-steps. Pt reported being close to/at mobility baseline. Pt has 24/7 assist available upon d/c home. Discussed recommendation for OP PT to address balance deficits, however, pt reported waiting to do PT until pain is under control (attending pain management clinic). Acute PT to continue to follow.         If plan is discharge home, recommend the following: A little help with walking and/or transfers;A lot of help with bathing/dressing/bathroom;Direct supervision/assist for medications management;Direct supervision/assist for financial management;Assist for transportation;Help with stairs or ramp for entrance   Can travel by private vehicle    Yes    Equipment Recommendations None recommended by PT     Functional Status Assessment Patient has had a recent decline in their functional status and demonstrates the ability to make significant improvements in function in a reasonable and predictable amount of time.     Precautions / Restrictions Precautions Precautions: Fall Recall of Precautions/Restrictions: Intact Restrictions Weight Bearing Restrictions Per Provider Order: No      Mobility  Bed Mobility Overal bed mobility:  Needs Assistance Bed Mobility: Supine to Sit, Sit to Supine    Supine to sit: Supervision Sit to supine: Supervision     Transfers Overall transfer level: Needs assistance Equipment used: 1 person hand held assist Transfers: Sit to/from Stand Sit to Stand: Min assist    General transfer comment: pt's husband provided small boost-up via [facility]    Ambulation/Gait Ambulation/Gait assistance: Contact guard assist Gait Distance (Feet): 400 Feet Assistive device: None Gait Pattern/deviations: Step-through pattern, Decreased stride length, Drifts right/left Gait velocity: decr     General Gait Details: Drifts right/left with slight instability noted. Able to correct small losses of balance with side-steps and CGA     Balance Overall balance assessment: Needs assistance Sitting-balance support: No upper extremity supported, Feet supported Sitting balance-Leahy Scale: Good     Standing balance support: No upper extremity supported Standing balance-Leahy Scale: Fair          Pertinent Vitals/Pain Pain Assessment Pain Assessment: Faces Faces Pain Scale: Hurts whole lot Pain Location: all over Pain Descriptors / Indicators: Burning, Cramping, Shooting Pain Intervention(s): Limited activity within patient's tolerance, Monitored during session, Repositioned    Home Living Family/patient expects to be discharged to:: Private residence Living Arrangements: Spouse/significant other Available Help at Discharge: Family;Available PRN/intermittently Type of Home: House Home Access: Stairs to enter Entrance Stairs-Rails: Right Entrance Stairs-Number of Steps: 3   Home Layout: One level Home Equipment: Shower seat;Cane - single point;Crutches      Prior Function Prior Level of Function : Independent/Modified Independent;Driving      Mobility Comments: Ind with no AD ADLs Comments: Assist needed to cook/clean which pt attributes to nerve damage/neuropathy. Increased difficulty  using L hand     Extremity/Trunk Assessment  Upper Extremity Assessment Upper Extremity Assessment: Defer to OT evaluation (L hand weakness/limited ROM since prior cardiac arrest)    Lower Extremity Assessment Lower Extremity Assessment: RLE deficits/detail;LLE deficits/detail RLE Deficits / Details: Hip flexion 4+/5, Knee ext 4+/5, Ankle DF 5/5 LLE Deficits / Details: Hip flexion 3+/5, Knee ext 4/5, Ankle DF 5/5 LLE Sensation:  (decreased alertness to light touch L3-S1)    Cervical / Trunk Assessment Cervical / Trunk Assessment: Normal  Communication   Communication Communication: No apparent difficulties    Cognition Arousal: Alert Behavior During Therapy: WFL for tasks assessed/performed   PT - Cognitive impairments: Memory    PT - Cognition Comments: Reported having difficulty with STM. Relies on husband to answer questions. Occasionally would trail off when conversing with pt unable to remember what she was saying Following commands: Intact       Cueing Cueing Techniques: Verbal cues      PT Assessment Patient needs continued PT services  PT Problem List Decreased strength;Decreased activity tolerance;Decreased balance;Decreased mobility;Decreased cognition;Decreased knowledge of use of DME       PT Treatment Interventions DME instruction;Gait training;Stair training;Functional mobility training;Therapeutic activities;Therapeutic exercise;Balance training;Neuromuscular re-education;Patient/family education    PT Goals (Current goals can be found in the Care Plan section)  Acute Rehab PT Goals Patient Stated Goal: to have less pain when moving PT Goal Formulation: With patient Time For Goal Achievement: 06/16/24 Potential to Achieve Goals: Good    Frequency Min 1X/week        AM-PAC PT 6 Clicks Mobility  Outcome Measure Help needed turning from your back to your side while in a flat bed without using bedrails?: A Little Help needed moving from lying  on your back to sitting on the side of a flat bed without using bedrails?: A Little Help needed moving to and from a bed to a chair (including a wheelchair)?: A Little Help needed standing up from a chair using your arms (e.g., wheelchair or bedside chair)?: A Little Help needed to walk in hospital room?: A Little Help needed climbing 3-5 steps with a railing? : A Lot 6 Click Score: 17    End of Session Equipment Utilized During Treatment: Gait belt Activity Tolerance: Patient tolerated treatment well Patient left: in bed;with call bell/phone within reach;with bed alarm set;with family/visitor present Nurse Communication: Mobility status PT Visit Diagnosis: Unsteadiness on feet (R26.81);Other abnormalities of gait and mobility (R26.89);Muscle weakness (generalized) (M62.81)    Time: 8571-8547 PT Time Calculation (min) (ACUTE ONLY): 24 min   Charges:   PT Evaluation $PT Eval Low Complexity: 1 Low   PT General Charges $$ ACUTE PT VISIT: 1 Visit       Kate ORN, PT, DPT Secure Chat Preferred  Rehab Office [phone]   Kate BRAVO Wendolyn 06/02/2024, 3:39 PM

## 2024-06-02 NOTE — Progress Notes (Signed)
   06/02/24 1020  Spiritual Encounters  Type of Visit Initial  Care provided to: Pt and family  Reason for visit Advance directives   Chaplain responded to consult for AD. Pt was unaware of AD already on file. Said the current HCPOA needs updating. Provided education and left AD with husband.

## 2024-06-02 NOTE — Progress Notes (Signed)
 PROGRESS NOTE    Monica Hopkins  FMW:[id number] DOB: [birth year] DOA: 06/01/2024 PCP: Loring Tanda Mae, MD  Outpatient Specialists:     Brief Narrative:  Patient is a 53 year old female with past medical history significant for cardiac arrest, prolapsed disc, bipolar disorder and orthostatic hypotension.  Patient was admitted with acute metabolic encephalopathy and possible left-sided pyelonephritis.  Encephalopathy seems to be clearing.  06/02/2024: Seen alongside patient's husband.  Patient seems to be improving.  UA done on presentation revealed moderate leukocyte, with urine specific gravity of 1.031.  MRI head has not shown any acute changes.  Urine culture is pending.   Assessment & Plan:   Principal Problem:   Acute pyelonephritis   Acute metabolic encephalopathy likely dehydration as well as possible left-sided pyelo-nephritis -Admitted with acute encephalopathy.   -Complaints of frequency and dysuria (R5 prior documentation) -Urinalysis was clear however patient had recent antibiotic course -Abdominal CT scan showing findings of possible upper UTI/pyelonephritis on the left -Follow urine cultures.   - Will order blood culture.   - Check procalcitonin.   - Encephalopathy has resolved significantly.     Bipolar disorder Continue trazodone  and Depakote    History of hypotension Continue midodrine  Volume depletion: -Urine revealed specific gravity of 1.031. - Adequate hydration   DVT prophylaxis: Subcutaneous Lovenox  Code Status: Full code Family Communication: Husband by the bedside Disposition Plan: Likely home eventually   Consultants:  None  Procedures:  None  Antimicrobials:  IV ceftriaxone    Subjective: No new complaints  Objective: Vitals:   06/02/24 0534 06/02/24 0852 06/02/24 1212 06/02/24 1634  BP: (!) 108/55 (!) 88/61 (!) 115/99 98/75  Pulse: 67 73 (!) 45 62  Resp: 17 20 20 20   Temp: (!) 97.5 F (36.4 C)  97.7 F (36.5 C) (!)  97.4 F (36.3 C)  TempSrc: Oral     SpO2: 99% 94% 97% 95%  Weight: 64.5 kg     Height: 5' 7 (1.702 m)       Intake/Output Summary (Last 24 hours) at 06/02/2024 1838 Last data filed at 06/02/2024 1612 Gross per 24 hour  Intake 2329.99 ml  Output --  Net 2329.99 ml   Filed Weights   06/02/24 0534  Weight: 64.5 kg    Examination:  General exam: Appears calm and comfortable  Respiratory system: Clear to auscultation. Respiratory effort normal. Cardiovascular system: S1 & S2 heard Gastrointestinal system: Abdomen is soft and nontender. Central nervous system: Awake and alert.  . Extremities: No leg edema  Data Reviewed: I have personally reviewed following labs and imaging studies  CBC: Recent Labs  Lab 06/01/24 1556 06/02/24 0320  WBC 6.9 5.5  NEUTROABS 3.2 2.3  HGB 12.7 11.3*  HCT 40.4 35.4*  MCV 92.4 92.4  PLT 210 176   Basic Metabolic Panel: Recent Labs  Lab 06/01/24 1556 06/02/24 0320  NA 138 140  K 4.4 3.8  CL 96* 101  CO2 32 28  GLUCOSE 101* 88  BUN 10 7  CREATININE 0.91 0.73  CALCIUM 9.2 9.1   GFR: Estimated Creatinine Clearance: 79.1 mL/min (by C-G formula based on SCr of 0.73 mg/dL). Liver Function Tests: Recent Labs  Lab 06/01/24 1556  AST 16  ALT 8  ALKPHOS 53  BILITOT 0.4  PROT 6.2*  ALBUMIN  3.6   No results for input(s): LIPASE, AMYLASE in the last 168 hours. No results for input(s): AMMONIA in the last 168 hours. Coagulation Profile: No results for input(s): INR, PROTIME in the last 168 hours.  Cardiac Enzymes: No results for input(s): CKTOTAL, CKMB, CKMBINDEX, TROPONINI in the last 168 hours. BNP (last 3 results) No results for input(s): PROBNP in the last 8760 hours. HbA1C: No results for input(s): HGBA1C in the last 72 hours. CBG: Recent Labs  Lab 06/01/24 1547  GLUCAP 99   Lipid Profile: No results for input(s): CHOL, HDL, LDLCALC, TRIG, CHOLHDL, LDLDIRECT in the last 72  hours. Thyroid Function Tests: No results for input(s): TSH, T4TOTAL, FREET4, T3FREE, THYROIDAB in the last 72 hours. Anemia Panel: No results for input(s): VITAMINB12, FOLATE, FERRITIN, TIBC, IRON, RETICCTPCT in the last 72 hours. Urine analysis:    Component Value Date/Time   COLORURINE YELLOW 06/01/2024 1950   APPEARANCEUR CLEAR 06/01/2024 1950   LABSPEC 1.031 (H) 06/01/2024 1950   PHURINE 7.0 06/01/2024 1950   GLUCOSEU NEGATIVE 06/01/2024 1950   HGBUR NEGATIVE 06/01/2024 1950   BILIRUBINUR NEGATIVE 06/01/2024 1950   KETONESUR NEGATIVE 06/01/2024 1950   PROTEINUR NEGATIVE 06/01/2024 1950   UROBILINOGEN 1.0 01/25/2014 2305   NITRITE NEGATIVE 06/01/2024 1950   LEUKOCYTESUR MODERATE (A) 06/01/2024 1950   Sepsis Labs: @LABRCNTIP (procalcitonin:4,lacticidven:4)  )No results found for this or any previous visit (from the past 240 hours).       Radiology Studies: MR BRAIN WO CONTRAST Result Date: 06/02/2024 CLINICAL DATA:  Initial evaluation for acute mental status change, unknown cause. EXAM: MRI HEAD WITHOUT CONTRAST TECHNIQUE: Multiplanar, multiecho pulse sequences of the brain and surrounding structures were obtained without intravenous contrast. COMPARISON:  Prior CT from 06/01/2024. FINDINGS: Brain: Cerebral volume within normal limits. Mild hazy and patchy T2/FLAIR signal abnormality noted involving the supratentorial cerebral white matter, most like related chronic microvascular ischemic disease. Changes are minor for age. No evidence for acute or subacute infarct. No areas of chronic cortical infarction. No acute or chronic intracranial blood products. No mass lesion, midline shift or mass effect. No hydrocephalus or extra-axial fluid collection. Partially empty sella noted. Vascular: Major intracranial vascular flow voids are maintained. Skull and upper cervical spine: Craniocervical junction within normal limits. Bone marrow signal intensity normal. No  scalp soft tissue abnormality. Sinuses/Orbits: Globes orbital soft tissues within normal limits. Paranasal sinuses are largely clear. Small right mastoid effusion noted, of doubtful significance. Other: None. IMPRESSION: 1. No acute intracranial abnormality. 2. Mild chronic microvascular ischemic disease for age. Electronically Signed   By: Morene Hoard M.D.   On: 06/02/2024 00:59   CT ABDOMEN PELVIS W CONTRAST Result Date: 06/01/2024 EXAM: CT ABDOMEN AND PELVIS WITH CONTRAST 06/01/2024 06:37:35 PM TECHNIQUE: CT of the abdomen and pelvis was performed with the administration of 75 mL of iohexol  (OMNIPAQUE ) 350 MG/ML injection. Multiplanar reformatted images are provided for review. Automated exposure control, iterative reconstruction, and/or weight-based adjustment of the mA/kV was utilized to reduce the radiation dose to as low as reasonably achievable. COMPARISON: CT 06/10/2022, 12/05/2019 CLINICAL HISTORY: UTI, recurrent/complicated (Female) FINDINGS: LOWER CHEST: Lung bases demonstrate mild atelectasis or scarring at the left base. LIVER: No focal hepatic abnormality. GALLBLADDER AND BILE DUCTS: Cholecystectomy. Mild intra- and extrahepatic biliary dilatation, which is chronic and likely due to postsurgical change. SPLEEN: No acute abnormality. PANCREAS: Atrophic pancreas without inflammation. ADRENAL GLANDS: Stable small bilateral adrenal gland nodules measuring up to 16 mm on the right, likely representing small adenomas, no specific imaging follow-up is recommended. KIDNEYS, URETERS AND BLADDER: Punctate nonobstructing left kidney stones. Mild cortical scarring of the left kidney. Slightly heterogeneous hypoenhancement of left kidney cortex on delayed views. No stones in the right kidney or ureters.  No hydronephrosis. No perinephric or periureteral stranding. Urinary bladder is unremarkable. GI AND BOWEL: Stomach demonstrates no acute abnormality. There is no bowel obstruction. PERITONEUM AND  RETROPERITONEUM: No ascites. No free air. VASCULATURE: Aorta is normal in caliber. LYMPH NODES: No lymphadenopathy. REPRODUCTIVE ORGANS: Hysterectomy. No suspicious adnexal mass. BONES AND SOFT TISSUES: Degenerative changes of the spine. No acute osseous abnormality. No focal soft tissue abnormality. IMPRESSION: 1. Cortical scarring of the left kidney. Slightly heterogeneous cortical hypoenhancement of the left kidney on delayed views, correlate for upper uti/pyelonephritis. 2. Punctate nonobstructing left nephrolithiasis. Electronically signed by: Luke Bun MD 06/01/2024 06:54 PM EDT RP Workstation: [id number]   CT HEAD WO CONTRAST Result Date: 06/01/2024 CLINICAL DATA:  Provided history: Neuro deficit, acute, stroke suspected EXAM: CT HEAD WITHOUT CONTRAST TECHNIQUE: Contiguous axial images were obtained from the base of the skull through the vertex without intravenous contrast. RADIATION DOSE REDUCTION: This exam was performed according to the departmental dose-optimization program which includes automated exposure control, adjustment of the mA and/or kV according to patient size and/or use of iterative reconstruction technique. COMPARISON:  Head CT 07/11/2023 FINDINGS: Brain: No intracranial hemorrhage, mass effect, or midline shift. No hydrocephalus. The basilar cisterns are patent. Mild symmetric basal gangliar mineralization is unchanged from prior. No evidence of territorial infarct or acute ischemia. No extra-axial or intracranial fluid collection. Vascular: No hyperdense vessel or unexpected calcification. Skull: No fracture or focal lesion. Sinuses/Orbits: Partial opacification of right mastoid air cells. Included paranasal sinuses are clear. Other: None. IMPRESSION: 1. No acute intracranial abnormality. 2. Partial opacification of right mastoid air cells. Electronically Signed   By: Andrea Gasman M.D.   On: 06/01/2024 16:21        Scheduled Meds:  clonazePAM   0.5 mg Oral BID    divalproex   250 mg Oral BID   enoxaparin  (LOVENOX ) injection  40 mg Subcutaneous Daily   gabapentin   100 mg Oral Q8H   midodrine  10 mg Oral TID WC   traZODone   150 mg Oral QHS   Continuous Infusions:  sodium chloride  100 mL/hr at 06/02/24 1612   cefTRIAXone  (ROCEPHIN )  IV Stopped (06/02/24 0342)     LOS: 0 days    Time spent: 55 minutes    Leatrice Chapel, MD  [facility] Pager #: [phone] 7PM-7AM contact night coverage as above

## 2024-06-02 NOTE — ED Notes (Signed)
 Admitting MD at bedside.

## 2024-06-02 NOTE — ED Notes (Addendum)
 Pt back from MRI. Pt placed back on the monitor

## 2024-06-03 DIAGNOSIS — N10 Acute tubulo-interstitial nephritis: Secondary | ICD-10-CM | POA: Diagnosis not present

## 2024-06-03 LAB — CBC WITH DIFFERENTIAL/PLATELET
Abs Immature Granulocytes: 0.01 K/uL (ref 0.00–0.07)
Basophils Absolute: 0.1 K/uL (ref 0.0–0.1)
Basophils Relative: 1 %
Eosinophils Absolute: 0.2 K/uL (ref 0.0–0.5)
Eosinophils Relative: 3 %
HCT: 38.3 % (ref 36.0–46.0)
Hemoglobin: 12.3 g/dL (ref 12.0–15.0)
Immature Granulocytes: 0 %
Lymphocytes Relative: 57 %
Lymphs Abs: 3 K/uL (ref 0.7–4.0)
MCH: 29.2 pg (ref 26.0–34.0)
MCHC: 32.1 g/dL (ref 30.0–36.0)
MCV: 91 fL (ref 80.0–100.0)
Monocytes Absolute: 0.4 K/uL (ref 0.1–1.0)
Monocytes Relative: 7 %
Neutro Abs: 1.7 K/uL (ref 1.7–7.7)
Neutrophils Relative %: 32 %
Platelets: 191 K/uL (ref 150–400)
RBC: 4.21 MIL/uL (ref 3.87–5.11)
RDW: 13.1 % (ref 11.5–15.5)
WBC: 5.3 K/uL (ref 4.0–10.5)
nRBC: 0 % (ref 0.0–0.2)

## 2024-06-03 LAB — URINE CULTURE

## 2024-06-03 LAB — RENAL FUNCTION PANEL
Albumin: 2.9 g/dL — ABNORMAL LOW (ref 3.5–5.0)
Anion gap: 13 (ref 5–15)
BUN: <5 mg/dL — ABNORMAL LOW (ref 6–20)
CO2: 23 mmol/L (ref 22–32)
Calcium: 8.7 mg/dL — ABNORMAL LOW (ref 8.9–10.3)
Chloride: 106 mmol/L (ref 98–111)
Creatinine, Ser: 0.63 mg/dL (ref 0.44–1.00)
GFR, Estimated: >60 mL/min (ref >60)
Glucose, Bld: 81 mg/dL (ref 70–99)
Phosphorus: 2.9 mg/dL (ref 2.5–4.6)
Potassium: 3.8 mmol/L (ref 3.5–5.1)
Sodium: 142 mmol/L (ref 135–145)

## 2024-06-03 LAB — BASIC METABOLIC PANEL WITH GFR
Anion gap: 12 (ref 5–15)
BUN: <5 mg/dL — ABNORMAL LOW (ref 6–20)
CO2: 24 mmol/L (ref 22–32)
Calcium: 8.7 mg/dL — ABNORMAL LOW (ref 8.9–10.3)
Chloride: 106 mmol/L (ref 98–111)
Creatinine, Ser: 0.64 mg/dL (ref 0.44–1.00)
GFR, Estimated: >60 mL/min (ref >60)
Glucose, Bld: 85 mg/dL (ref 70–99)
Potassium: 3.8 mmol/L (ref 3.5–5.1)
Sodium: 142 mmol/L (ref 135–145)

## 2024-06-03 LAB — SODIUM, URINE, RANDOM: Sodium, Ur: 192 mmol/L

## 2024-06-03 LAB — MAGNESIUM: Magnesium: 1.7 mg/dL (ref 1.7–2.4)

## 2024-06-03 MED ORDER — CEPHALEXIN 500 MG PO CAPS: 500.0000 mg | ORAL_CAPSULE | Freq: Four times a day (QID) | ORAL | 0 refills | Status: AC

## 2024-06-03 NOTE — Plan of Care (Signed)

## 2024-06-03 NOTE — Evaluation (Signed)
 Occupational Therapy Evaluation and Discharge Patient Details Name: Monica Hopkins MRN: [id number] DOB: Jun 14, 1971 Today's Date: 06/03/2024   History of Present Illness   53 y.o. female presents to [facility] 06/01/24 with dysuria, AMS, and R facial droop. Pt with acute metabolic encephalopathy and possible upper UTI/pyelonephritis on L. Also with punctate L nephrolithiasis. PMHx: orthostatic hypotension, bipolar disorder     Clinical Impressions Prior to this admission, patient living with her husband, able to complete basic ADL depending on pain, receiving occasional assist depending on pain severity. Patient still drives when able and ambulates without AD. Currently, patient presenting back to her baseline, with husband able to provide support at home. Patient requiring no additional DME at home, and husband to double check medications due previous confusion to ensure utmost safety. OT will sign off at this time, with no further OT recommended at discharge.      If plan is discharge home, recommend the following:   Assist for transportation;Direct supervision/assist for medications management;Help with stairs or ramp for entrance;Assistance with cooking/housework (initially)     Functional Status Assessment   Patient has had a recent decline in their functional status and demonstrates the ability to make significant improvements in function in a reasonable and predictable amount of time.     Equipment Recommendations   None recommended by OT     Recommendations for Other Services         Precautions/Restrictions   Precautions Precautions: Fall Recall of Precautions/Restrictions: Intact Restrictions Weight Bearing Restrictions Per Provider Order: No     Mobility Bed Mobility Overal bed mobility: Needs Assistance Bed Mobility: Supine to Sit     Supine to sit: Supervision          Transfers                   General transfer comment: has been ambulating  to bathroom independently      Balance Overall balance assessment: Needs assistance Sitting-balance support: No upper extremity supported, Feet supported Sitting balance-Leahy Scale: Good     Standing balance support: No upper extremity supported Standing balance-Leahy Scale: Fair                             ADL either performed or assessed with clinical judgement   ADL Overall ADL's : At baseline                                       General ADL Comments: Prior to this admission, patient living with her husband, able to complete basic ADL depending on pain, receiving occasional assist depending on pain severity. Patient still drives when able and ambulates without AD. Currently, patient presenting back to her baseline, with husband able to provide support at home. Patient requiring no additional DME at home, and husband to double check medications due previous confusion to ensure utmost safety. OT will sign off at this time, with no further OT recommended at discharge.     Vision Baseline Vision/History: 0 No visual deficits Ability to See in Adequate Light: 0 Adequate Patient Visual Report: No change from baseline Vision Assessment?: No apparent visual deficits     Perception Perception: Not tested       Praxis Praxis: Not tested       Pertinent Vitals/Pain Pain Assessment Pain Assessment: Faces Faces Pain Scale: Hurts whole lot Pain  Location: all over Pain Descriptors / Indicators: Burning, Cramping, Shooting Pain Intervention(s): Limited activity within patient's tolerance, Monitored during session, Repositioned     Extremity/Trunk Assessment Upper Extremity Assessment Upper Extremity Assessment: LUE deficits/detail;Generalized weakness LUE Deficits / Details: previous nerve damage affecting strenghth 3+/5 shoulder, unable to achieve full grip, decreased sensation, no acute changes LUE Sensation: decreased light touch LUE  Coordination: decreased fine motor;decreased gross motor   Lower Extremity Assessment Lower Extremity Assessment: Defer to PT evaluation LLE Sensation:  (decreased alertness to light touch L3-S1)   Cervical / Trunk Assessment Cervical / Trunk Assessment: Normal   Communication Communication Communication: No apparent difficulties   Cognition Arousal: Alert Behavior During Therapy: WFL for tasks assessed/performed Cognition: No apparent impairments             OT - Cognition Comments: Spouse to double check medications in the meantime to ensure utmost safety                 Following commands: Intact       Cueing  General Comments   Cueing Techniques: Verbal cues  VSS on RA   Exercises     Shoulder Instructions      Home Living Family/patient expects to be discharged to:: Private residence Living Arrangements: Spouse/significant other Available Help at Discharge: Family;Available PRN/intermittently Type of Home: House Home Access: Stairs to enter [employer] of Steps: 3 Entrance Stairs-Rails: Right Home Layout: One level     Bathroom Shower/Tub: Tub/shower unit         Home Equipment: Shower seat;Cane - single point;Crutches          Prior Functioning/Environment Prior Level of Function : Independent/Modified Independent;Driving             Mobility Comments: Ind with no AD ADLs Comments: Assist needed to cook/clean which pt attributes to nerve damage/neuropathy. Increased difficulty using L hand, needs occasional assist for bathing    OT Problem List: Decreased activity tolerance   OT Treatment/Interventions:        OT Goals(Current goals can be found in the care plan section)   Acute Rehab OT Goals Patient Stated Goal: to go home OT Goal Formulation: With patient Time For Goal Achievement: 06/17/24 Potential to Achieve Goals: Good   OT Frequency:       Co-evaluation              AM-PAC OT 6 Clicks  Daily Activity     Outcome Measure Help from another person eating meals?: None Help from another person taking care of personal grooming?: None Help from another person toileting, which includes using toliet, bedpan, or urinal?: None Help from another person bathing (including washing, rinsing, drying)?: A Little Help from another person to put on and taking off regular upper body clothing?: None Help from another person to put on and taking off regular lower body clothing?: A Little 6 Click Score: 22   End of Session Nurse Communication: Mobility status  Activity Tolerance: Patient tolerated treatment well Patient left: in bed;with call bell/phone within reach;with family/visitor present (sitting EOB)  OT Visit Diagnosis: Pain;Muscle weakness (generalized) (M62.81)                Time: 8579-8565 OT Time Calculation (min): 14 min Charges:  OT General Charges $OT Visit: 1 Visit OT Evaluation $OT Eval Moderate Complexity: 1 Mod  Ronal Gift E. Aniyha Tate, OTR/L Acute Rehabilitation Services [phone]   Ronal Gift Salt 06/03/2024, 2:51 PM

## 2024-06-03 NOTE — Progress Notes (Signed)
 Pt had an episode of incontinence on the way to the toilet. Still unable to collect urine sample.

## 2024-06-03 NOTE — Progress Notes (Signed)
 Pt aware to provide urine sample. Alert and oriented x4. Hat in toilet and call bell within reach.

## 2024-06-03 NOTE — Discharge Summary (Signed)
 Physician Discharge Summary  Patient ID: Monica Hopkins MRN: [id number] DOB/AGE: 11/19/1970 53 y.o.  Admit date: 06/01/2024 Discharge date: 06/03/2024  Admission Diagnoses:  Discharge Diagnoses:  Principal Problem:   Acute pyelonephritis   Discharged Condition: {condition:18240}  Hospital Course: ***  Consults: {consultation:18241}  Significant Diagnostic Studies: {diagnostics:18242}  Treatments: {Tx:18249}  Discharge Exam: Blood pressure 107/74, pulse 64, temperature 98.3 F (36.8 C), resp. rate 16, height 5' 7 (1.702 m), weight 64.5 kg, SpO2 96%. {physical zkjf:6958869}  Disposition: Discharge disposition: 01-Home or Self Care       Discharge Instructions     Diet - low sodium heart healthy   Complete by: As directed    Increase activity slowly   Complete by: As directed       Allergies as of 06/03/2024       Reactions   Abilify [aripiprazole] Swelling, Rash   Lamotrigine Rash, Swelling, Other (See Comments)   Other Reaction(s): Mental Status Changes   Latuda  [lurasidone ] Other (See Comments)   Caused leg weakness   Seroquel  [quetiapine ] Other (See Comments)   Insomnia, restlessness        Medication List     STOP taking these medications    cyanocobalamin 500 MCG tablet Commonly known as: VITAMIN B12   ibuprofen  200 MG tablet Commonly known as: ADVIL    lidocaine  5 % Commonly known as: LIDODERM    midodrine 10 MG tablet Commonly known as: PROAMATINE   naloxone 4 MG/0.1ML Liqd nasal spray kit Commonly known as: NARCAN   promethazine 25 MG tablet Commonly known as: PHENERGAN   Proventil  HFA 108 (90 Base) MCG/ACT inhaler Generic drug: albuterol        TAKE these medications    acetaminophen  500 MG tablet Commonly known as: TYLENOL  Take 1,000 mg by mouth every 6 (six) hours as needed for headache.   cephALEXin 500 MG capsule Commonly known as: KEFLEX Take 1 capsule (500 mg total) by mouth 4 (four) times daily for 3 days.    clonazePAM  0.5 MG tablet Commonly known as: KLONOPIN  Take one tab twice a daily and 3rd needed for severe anxiety What changed:  how much to take how to take this when to take this additional instructions   divalproex  250 MG DR tablet Commonly known as: DEPAKOTE  Take 250 mg by mouth 2 (two) times daily.   gabapentin  100 MG capsule Commonly known as: NEURONTIN  Take 100 mg by mouth every 8 (eight) hours.   Oxycodone  HCl 10 MG Tabs Take 10 mg by mouth every 6 (six) hours.   traZODone  150 MG tablet Commonly known as: DESYREL  Take 150-400 mg by mouth at bedtime. Take one-two and 2/3's tablet by mouth every night at bedtime.        Follow-up Information     Loring Tanda Mae, MD Follow up in 1 week(s).   Specialty: Family Medicine Contact information: [address] [place] [address] [phone]                 Signed: Leatrice LILLETTE Chapel 06/03/2024, 1:50 PM

## 2024-06-07 LAB — CULTURE, BLOOD (ROUTINE X 2)
Culture: NO GROWTH
Culture: NO GROWTH
Special Requests: ADEQUATE
Special Requests: ADEQUATE
